# Patient Record
Sex: Female | Born: 1950 | Race: Black or African American | Hispanic: No | Smoking: Never smoker
Health system: Southern US, Community
[De-identification: ages and names within clinical notes are randomized; demographics above are authoritative.]

## PROBLEM LIST (undated history)

## (undated) ENCOUNTER — Emergency Department: Admission: EM | Discharge status: Against Medical Advice | Attending: Emergency Medicine | Admitting: Emergency Medicine

## (undated) DIAGNOSIS — D219 Benign neoplasm of connective and other soft tissue, unspecified: Secondary | ICD-10-CM

## (undated) DIAGNOSIS — M545 Low back pain, unspecified: Secondary | ICD-10-CM

## (undated) DIAGNOSIS — M1990 Unspecified osteoarthritis, unspecified site: Secondary | ICD-10-CM

## (undated) DIAGNOSIS — E785 Hyperlipidemia, unspecified: Secondary | ICD-10-CM

## (undated) DIAGNOSIS — I10 Essential (primary) hypertension: Secondary | ICD-10-CM

## (undated) DIAGNOSIS — R262 Difficulty in walking, not elsewhere classified: Secondary | ICD-10-CM

## (undated) DIAGNOSIS — M2550 Pain in unspecified joint: Secondary | ICD-10-CM

## (undated) DIAGNOSIS — R1012 Left upper quadrant pain: Secondary | ICD-10-CM

## (undated) DIAGNOSIS — E7800 Pure hypercholesterolemia, unspecified: Secondary | ICD-10-CM

## (undated) DIAGNOSIS — K449 Diaphragmatic hernia without obstruction or gangrene: Secondary | ICD-10-CM

## (undated) DIAGNOSIS — L4050 Arthropathic psoriasis, unspecified: Secondary | ICD-10-CM

## (undated) DIAGNOSIS — K5900 Constipation, unspecified: Secondary | ICD-10-CM

## (undated) DIAGNOSIS — H539 Unspecified visual disturbance: Secondary | ICD-10-CM

## (undated) HISTORY — DX: Low back pain, unspecified: M54.50

## (undated) HISTORY — DX: Essential (primary) hypertension: I10

## (undated) HISTORY — DX: Hyperlipidemia, unspecified: E78.5

## (undated) HISTORY — PX: HYSTERECTOMY: SHX81

## (undated) HISTORY — DX: Diaphragmatic hernia without obstruction or gangrene: K44.9

## (undated) HISTORY — DX: Difficulty in walking, not elsewhere classified: R26.2

## (undated) HISTORY — DX: Benign neoplasm of connective and other soft tissue, unspecified: D21.9

## (undated) HISTORY — DX: Constipation, unspecified: K59.00

## (undated) HISTORY — PX: BUNIONECTOMY: SHX129

## (undated) HISTORY — DX: Unspecified osteoarthritis, unspecified site: M19.90

## (undated) HISTORY — DX: Left upper quadrant pain: R10.12

## (undated) HISTORY — DX: Unspecified visual disturbance: H53.9

## (undated) HISTORY — DX: Pure hypercholesterolemia, unspecified: E78.00

## (undated) HISTORY — DX: Pain in unspecified joint: M25.50

## (undated) HISTORY — PX: COLONOSCOPY: SHX174

---

## 2003-07-13 ENCOUNTER — Encounter: Admission: RE | Admit: 2003-07-13 | Discharge: 2003-07-13 | Payer: Self-pay | Admitting: Family Medicine

## 2003-07-13 ENCOUNTER — Encounter: Payer: Self-pay | Admitting: Family Medicine

## 2003-07-31 ENCOUNTER — Other Ambulatory Visit: Admission: RE | Admit: 2003-07-31 | Discharge: 2003-07-31 | Payer: Self-pay | Admitting: Family Medicine

## 2003-09-25 ENCOUNTER — Encounter: Admission: RE | Admit: 2003-09-25 | Discharge: 2003-09-25 | Payer: Self-pay | Admitting: Family Medicine

## 2004-07-28 ENCOUNTER — Other Ambulatory Visit: Admission: RE | Admit: 2004-07-28 | Discharge: 2004-07-28 | Payer: Self-pay | Admitting: Family Medicine

## 2004-09-09 ENCOUNTER — Ambulatory Visit: Payer: Self-pay | Admitting: Family Medicine

## 2004-09-30 ENCOUNTER — Encounter: Admission: RE | Admit: 2004-09-30 | Discharge: 2004-09-30 | Payer: Self-pay | Admitting: Family Medicine

## 2005-08-01 ENCOUNTER — Ambulatory Visit: Payer: Self-pay | Admitting: Family Medicine

## 2005-08-03 ENCOUNTER — Ambulatory Visit: Payer: Self-pay | Admitting: Family Medicine

## 2005-09-04 ENCOUNTER — Encounter: Payer: Self-pay | Admitting: Family Medicine

## 2005-09-04 ENCOUNTER — Other Ambulatory Visit: Admission: RE | Admit: 2005-09-04 | Discharge: 2005-09-04 | Payer: Self-pay | Admitting: Family Medicine

## 2005-09-04 ENCOUNTER — Ambulatory Visit: Payer: Self-pay | Admitting: Family Medicine

## 2005-09-18 ENCOUNTER — Ambulatory Visit: Payer: Self-pay | Admitting: Family Medicine

## 2005-10-30 ENCOUNTER — Encounter: Admission: RE | Admit: 2005-10-30 | Discharge: 2005-10-30 | Payer: Self-pay | Admitting: Family Medicine

## 2006-01-04 ENCOUNTER — Ambulatory Visit: Payer: Self-pay | Admitting: Family Medicine

## 2006-02-07 ENCOUNTER — Ambulatory Visit: Payer: Self-pay | Admitting: Internal Medicine

## 2006-02-16 ENCOUNTER — Ambulatory Visit: Payer: Self-pay | Admitting: Family Medicine

## 2006-03-07 ENCOUNTER — Ambulatory Visit: Payer: Self-pay | Admitting: Internal Medicine

## 2006-04-20 ENCOUNTER — Ambulatory Visit: Payer: Self-pay | Admitting: Family Medicine

## 2006-10-08 ENCOUNTER — Ambulatory Visit: Payer: Self-pay | Admitting: Family Medicine

## 2006-10-08 LAB — CONVERTED CEMR LAB
ALT: 23 units/L (ref 0–40)
AST: 25 units/L (ref 0–37)
Albumin: 3.8 g/dL (ref 3.5–5.2)
Alkaline Phosphatase: 58 units/L (ref 39–117)
BUN: 9 mg/dL (ref 6–23)
CO2: 25 meq/L (ref 19–32)
Calcium: 9.2 mg/dL (ref 8.4–10.5)
Chloride: 103 meq/L (ref 96–112)
Creatinine, Ser: 0.8 mg/dL (ref 0.4–1.2)
GFR calc non Af Amer: 79 mL/min
Glomerular Filtration Rate, Af Am: 96 mL/min/{1.73_m2}
Glucose, Bld: 80 mg/dL (ref 70–99)
Potassium: 3.6 meq/L (ref 3.5–5.1)
Sodium: 137 meq/L (ref 135–145)
Total Bilirubin: 0.8 mg/dL (ref 0.3–1.2)
Total Protein: 7.2 g/dL (ref 6.0–8.3)

## 2006-10-30 ENCOUNTER — Ambulatory Visit: Payer: Self-pay | Admitting: Family Medicine

## 2006-11-16 ENCOUNTER — Ambulatory Visit: Payer: Self-pay | Admitting: Family Medicine

## 2006-11-16 ENCOUNTER — Encounter: Admission: RE | Admit: 2006-11-16 | Discharge: 2006-11-16 | Payer: Self-pay | Admitting: Family Medicine

## 2006-11-30 ENCOUNTER — Encounter: Admission: RE | Admit: 2006-11-30 | Discharge: 2006-11-30 | Payer: Self-pay | Admitting: Family Medicine

## 2006-12-06 HISTORY — PX: COLONOSCOPY: SHX174

## 2007-03-06 ENCOUNTER — Encounter: Payer: Self-pay | Admitting: Family Medicine

## 2007-03-22 ENCOUNTER — Ambulatory Visit: Payer: Self-pay | Admitting: Family Medicine

## 2007-05-22 ENCOUNTER — Telehealth (INDEPENDENT_AMBULATORY_CARE_PROVIDER_SITE_OTHER): Payer: Self-pay | Admitting: *Deleted

## 2007-05-23 ENCOUNTER — Ambulatory Visit: Payer: Self-pay | Admitting: Family Medicine

## 2007-05-24 ENCOUNTER — Encounter: Payer: Self-pay | Admitting: Family Medicine

## 2007-06-14 ENCOUNTER — Telehealth (INDEPENDENT_AMBULATORY_CARE_PROVIDER_SITE_OTHER): Payer: Self-pay | Admitting: *Deleted

## 2007-06-17 ENCOUNTER — Telehealth (INDEPENDENT_AMBULATORY_CARE_PROVIDER_SITE_OTHER): Payer: Self-pay | Admitting: *Deleted

## 2007-07-04 ENCOUNTER — Telehealth (INDEPENDENT_AMBULATORY_CARE_PROVIDER_SITE_OTHER): Payer: Self-pay | Admitting: *Deleted

## 2007-08-13 ENCOUNTER — Ambulatory Visit: Payer: Self-pay | Admitting: Internal Medicine

## 2007-08-13 DIAGNOSIS — J029 Acute pharyngitis, unspecified: Secondary | ICD-10-CM

## 2007-08-13 DIAGNOSIS — K219 Gastro-esophageal reflux disease without esophagitis: Secondary | ICD-10-CM

## 2007-09-12 ENCOUNTER — Ambulatory Visit: Payer: Self-pay | Admitting: Family Medicine

## 2007-09-16 ENCOUNTER — Encounter (INDEPENDENT_AMBULATORY_CARE_PROVIDER_SITE_OTHER): Payer: Self-pay | Admitting: *Deleted

## 2007-09-16 LAB — CONVERTED CEMR LAB
ALT: 21 units/L (ref 0–35)
AST: 21 units/L (ref 0–37)
Albumin: 3.6 g/dL (ref 3.5–5.2)
Alkaline Phosphatase: 50 units/L (ref 39–117)
Bilirubin, Direct: 0.1 mg/dL (ref 0.0–0.3)
Total Bilirubin: 0.7 mg/dL (ref 0.3–1.2)
Total Protein: 7.1 g/dL (ref 6.0–8.3)

## 2007-09-17 ENCOUNTER — Encounter: Payer: Self-pay | Admitting: Family Medicine

## 2007-09-20 ENCOUNTER — Encounter: Payer: Self-pay | Admitting: Family Medicine

## 2007-10-29 ENCOUNTER — Emergency Department (HOSPITAL_COMMUNITY): Admission: EM | Admit: 2007-10-29 | Discharge: 2007-10-29 | Payer: Self-pay | Admitting: Family Medicine

## 2007-10-29 ENCOUNTER — Telehealth (INDEPENDENT_AMBULATORY_CARE_PROVIDER_SITE_OTHER): Payer: Self-pay | Admitting: *Deleted

## 2007-12-12 ENCOUNTER — Encounter: Admission: RE | Admit: 2007-12-12 | Discharge: 2007-12-12 | Payer: Self-pay | Admitting: Family Medicine

## 2007-12-16 ENCOUNTER — Encounter (INDEPENDENT_AMBULATORY_CARE_PROVIDER_SITE_OTHER): Payer: Self-pay | Admitting: *Deleted

## 2007-12-19 ENCOUNTER — Ambulatory Visit: Payer: Self-pay | Admitting: Family Medicine

## 2007-12-19 DIAGNOSIS — F438 Other reactions to severe stress: Secondary | ICD-10-CM

## 2007-12-19 DIAGNOSIS — N393 Stress incontinence (female) (male): Secondary | ICD-10-CM

## 2007-12-19 DIAGNOSIS — I10 Essential (primary) hypertension: Secondary | ICD-10-CM | POA: Insufficient documentation

## 2007-12-19 DIAGNOSIS — E785 Hyperlipidemia, unspecified: Secondary | ICD-10-CM | POA: Insufficient documentation

## 2007-12-24 ENCOUNTER — Encounter: Payer: Self-pay | Admitting: Family Medicine

## 2008-01-10 ENCOUNTER — Telehealth (INDEPENDENT_AMBULATORY_CARE_PROVIDER_SITE_OTHER): Payer: Self-pay | Admitting: *Deleted

## 2008-03-27 ENCOUNTER — Ambulatory Visit: Payer: Self-pay | Admitting: Family Medicine

## 2008-03-30 ENCOUNTER — Encounter (INDEPENDENT_AMBULATORY_CARE_PROVIDER_SITE_OTHER): Payer: Self-pay | Admitting: *Deleted

## 2008-03-30 ENCOUNTER — Encounter: Payer: Self-pay | Admitting: Family Medicine

## 2008-04-09 ENCOUNTER — Ambulatory Visit: Payer: Self-pay | Admitting: Internal Medicine

## 2008-05-18 ENCOUNTER — Telehealth (INDEPENDENT_AMBULATORY_CARE_PROVIDER_SITE_OTHER): Payer: Self-pay | Admitting: *Deleted

## 2008-06-16 ENCOUNTER — Telehealth (INDEPENDENT_AMBULATORY_CARE_PROVIDER_SITE_OTHER): Payer: Self-pay | Admitting: *Deleted

## 2008-06-17 ENCOUNTER — Telehealth (INDEPENDENT_AMBULATORY_CARE_PROVIDER_SITE_OTHER): Payer: Self-pay | Admitting: *Deleted

## 2008-08-23 HISTORY — PX: BUNIONECTOMY: SHX129

## 2008-09-14 ENCOUNTER — Ambulatory Visit: Payer: Self-pay | Admitting: Internal Medicine

## 2008-09-14 DIAGNOSIS — R079 Chest pain, unspecified: Secondary | ICD-10-CM

## 2008-09-14 DIAGNOSIS — R209 Unspecified disturbances of skin sensation: Secondary | ICD-10-CM

## 2008-09-14 DIAGNOSIS — Z860100 Personal history of colon polyps, unspecified: Secondary | ICD-10-CM | POA: Insufficient documentation

## 2008-09-15 ENCOUNTER — Ambulatory Visit: Payer: Self-pay | Admitting: Internal Medicine

## 2008-09-16 ENCOUNTER — Telehealth (INDEPENDENT_AMBULATORY_CARE_PROVIDER_SITE_OTHER): Payer: Self-pay | Admitting: *Deleted

## 2008-11-25 ENCOUNTER — Encounter: Payer: Self-pay | Admitting: Internal Medicine

## 2008-12-28 ENCOUNTER — Encounter: Admission: RE | Admit: 2008-12-28 | Discharge: 2008-12-28 | Payer: Self-pay | Admitting: Family Medicine

## 2008-12-31 ENCOUNTER — Encounter: Admission: RE | Admit: 2008-12-31 | Discharge: 2008-12-31 | Payer: Self-pay | Admitting: Family Medicine

## 2009-01-01 ENCOUNTER — Encounter (INDEPENDENT_AMBULATORY_CARE_PROVIDER_SITE_OTHER): Payer: Self-pay | Admitting: *Deleted

## 2009-01-01 ENCOUNTER — Encounter: Payer: Self-pay | Admitting: Family Medicine

## 2009-01-15 ENCOUNTER — Telehealth (INDEPENDENT_AMBULATORY_CARE_PROVIDER_SITE_OTHER): Payer: Self-pay | Admitting: *Deleted

## 2009-01-25 ENCOUNTER — Encounter: Payer: Self-pay | Admitting: Family Medicine

## 2009-01-25 ENCOUNTER — Ambulatory Visit: Payer: Self-pay | Admitting: Family Medicine

## 2009-01-25 ENCOUNTER — Other Ambulatory Visit: Admission: RE | Admit: 2009-01-25 | Discharge: 2009-01-25 | Payer: Self-pay | Admitting: Family Medicine

## 2009-01-26 ENCOUNTER — Ambulatory Visit: Payer: Self-pay | Admitting: Family Medicine

## 2009-01-26 LAB — CONVERTED CEMR LAB
ALT: 18 units/L (ref 0–35)
AST: 21 units/L (ref 0–37)
Albumin: 3.8 g/dL (ref 3.5–5.2)
Alkaline Phosphatase: 46 units/L (ref 39–117)
BUN: 14 mg/dL (ref 6–23)
Basophils Absolute: 0 10*3/uL (ref 0.0–0.1)
Basophils Relative: 0 % (ref 0.0–3.0)
Bilirubin, Direct: 0 mg/dL (ref 0.0–0.3)
CO2: 30 meq/L (ref 19–32)
Calcium: 9.6 mg/dL (ref 8.4–10.5)
Chloride: 103 meq/L (ref 96–112)
Cholesterol: 192 mg/dL (ref 0–200)
Creatinine, Ser: 1 mg/dL (ref 0.4–1.2)
Eosinophils Absolute: 0.1 10*3/uL (ref 0.0–0.7)
Eosinophils Relative: 1.1 % (ref 0.0–5.0)
GFR calc non Af Amer: 73.65 mL/min (ref >60)
Glucose, Bld: 99 mg/dL (ref 70–99)
HCT: 38.2 % (ref 36.0–46.0)
HDL: 63.4 mg/dL (ref >39.00)
Hemoglobin: 12.6 g/dL (ref 12.0–15.0)
LDL Cholesterol: 108 mg/dL — ABNORMAL HIGH (ref 0–99)
Lymphocytes Relative: 44.2 % (ref 12.0–46.0)
Lymphs Abs: 2.5 10*3/uL (ref 0.7–4.0)
MCHC: 32.9 g/dL (ref 30.0–36.0)
MCV: 85 fL (ref 78.0–100.0)
Monocytes Absolute: 0.5 10*3/uL (ref 0.1–1.0)
Monocytes Relative: 8.4 % (ref 3.0–12.0)
Neutro Abs: 2.6 10*3/uL (ref 1.4–7.7)
Neutrophils Relative %: 46.3 % (ref 43.0–77.0)
Platelets: 167 10*3/uL (ref 150.0–400.0)
Potassium: 3.9 meq/L (ref 3.5–5.1)
RBC: 4.5 M/uL (ref 3.87–5.11)
RDW: 12.8 % (ref 11.5–14.6)
Sodium: 139 meq/L (ref 135–145)
TSH: 3.17 microintl units/mL (ref 0.35–5.50)
Total Bilirubin: 0.8 mg/dL (ref 0.3–1.2)
Total CHOL/HDL Ratio: 3
Total Protein: 7.5 g/dL (ref 6.0–8.3)
Triglycerides: 101 mg/dL (ref 0.0–149.0)
VLDL: 20.2 mg/dL (ref 0.0–40.0)
WBC: 5.7 10*3/uL (ref 4.5–10.5)

## 2009-01-27 ENCOUNTER — Encounter (INDEPENDENT_AMBULATORY_CARE_PROVIDER_SITE_OTHER): Payer: Self-pay | Admitting: *Deleted

## 2009-01-28 ENCOUNTER — Encounter (INDEPENDENT_AMBULATORY_CARE_PROVIDER_SITE_OTHER): Payer: Self-pay | Admitting: *Deleted

## 2009-02-01 LAB — CONVERTED CEMR LAB

## 2009-02-02 ENCOUNTER — Encounter (INDEPENDENT_AMBULATORY_CARE_PROVIDER_SITE_OTHER): Payer: Self-pay | Admitting: *Deleted

## 2009-04-16 ENCOUNTER — Ambulatory Visit: Payer: Self-pay | Admitting: Internal Medicine

## 2009-04-16 DIAGNOSIS — M7710 Lateral epicondylitis, unspecified elbow: Secondary | ICD-10-CM | POA: Insufficient documentation

## 2009-04-23 ENCOUNTER — Telehealth (INDEPENDENT_AMBULATORY_CARE_PROVIDER_SITE_OTHER): Payer: Self-pay | Admitting: *Deleted

## 2009-08-11 ENCOUNTER — Encounter (INDEPENDENT_AMBULATORY_CARE_PROVIDER_SITE_OTHER): Payer: Self-pay | Admitting: *Deleted

## 2009-08-11 ENCOUNTER — Ambulatory Visit: Payer: Self-pay | Admitting: Family Medicine

## 2009-08-11 DIAGNOSIS — N6019 Diffuse cystic mastopathy of unspecified breast: Secondary | ICD-10-CM

## 2009-08-12 ENCOUNTER — Telehealth (INDEPENDENT_AMBULATORY_CARE_PROVIDER_SITE_OTHER): Payer: Self-pay | Admitting: *Deleted

## 2009-08-23 ENCOUNTER — Telehealth (INDEPENDENT_AMBULATORY_CARE_PROVIDER_SITE_OTHER): Payer: Self-pay | Admitting: *Deleted

## 2009-08-23 ENCOUNTER — Encounter (INDEPENDENT_AMBULATORY_CARE_PROVIDER_SITE_OTHER): Payer: Self-pay | Admitting: *Deleted

## 2009-08-23 LAB — CONVERTED CEMR LAB
ALT: 37 units/L — ABNORMAL HIGH (ref 0–35)
AST: 32 units/L (ref 0–37)
Albumin: 4.2 g/dL (ref 3.5–5.2)
Alkaline Phosphatase: 46 units/L (ref 39–117)
BUN: 12 mg/dL (ref 6–23)
Bilirubin, Direct: 0.1 mg/dL (ref 0.0–0.3)
CO2: 29 meq/L (ref 19–32)
Calcium: 9.8 mg/dL (ref 8.4–10.5)
Chloride: 107 meq/L (ref 96–112)
Cholesterol: 215 mg/dL — ABNORMAL HIGH (ref 0–200)
Creatinine, Ser: 1.2 mg/dL (ref 0.4–1.2)
Direct LDL: 161.8 mg/dL
GFR calc non Af Amer: 59.56 mL/min (ref >60)
Glucose, Bld: 98 mg/dL (ref 70–99)
HDL: 45.1 mg/dL (ref >39.00)
Potassium: 4.5 meq/L (ref 3.5–5.1)
Sodium: 145 meq/L (ref 135–145)
Total Bilirubin: 0.7 mg/dL (ref 0.3–1.2)
Total CHOL/HDL Ratio: 5
Total Protein: 7.3 g/dL (ref 6.0–8.3)
Triglycerides: 90 mg/dL (ref 0.0–149.0)
VLDL: 18 mg/dL (ref 0.0–40.0)

## 2009-08-27 ENCOUNTER — Encounter: Payer: Self-pay | Admitting: Family Medicine

## 2009-12-30 ENCOUNTER — Encounter: Admission: RE | Admit: 2009-12-30 | Discharge: 2009-12-30 | Payer: Self-pay | Admitting: Family Medicine

## 2010-01-15 ENCOUNTER — Emergency Department (HOSPITAL_COMMUNITY): Admission: EM | Admit: 2010-01-15 | Discharge: 2010-01-15 | Payer: Self-pay | Admitting: Family Medicine

## 2010-01-17 ENCOUNTER — Ambulatory Visit: Payer: Self-pay | Admitting: Family Medicine

## 2010-01-17 ENCOUNTER — Telehealth: Payer: Self-pay | Admitting: Family Medicine

## 2010-01-17 DIAGNOSIS — B029 Zoster without complications: Secondary | ICD-10-CM | POA: Insufficient documentation

## 2010-02-25 ENCOUNTER — Encounter: Payer: Self-pay | Admitting: Family Medicine

## 2010-03-31 ENCOUNTER — Telehealth (INDEPENDENT_AMBULATORY_CARE_PROVIDER_SITE_OTHER): Payer: Self-pay | Admitting: *Deleted

## 2010-04-05 ENCOUNTER — Ambulatory Visit: Payer: Self-pay | Admitting: Family Medicine

## 2010-06-07 ENCOUNTER — Telehealth (INDEPENDENT_AMBULATORY_CARE_PROVIDER_SITE_OTHER): Payer: Self-pay | Admitting: *Deleted

## 2010-08-04 ENCOUNTER — Emergency Department (HOSPITAL_COMMUNITY): Admission: EM | Admit: 2010-08-04 | Discharge: 2010-08-04 | Payer: Self-pay | Admitting: Family Medicine

## 2010-11-17 ENCOUNTER — Telehealth (INDEPENDENT_AMBULATORY_CARE_PROVIDER_SITE_OTHER): Payer: Self-pay | Admitting: *Deleted

## 2010-11-20 LAB — CONVERTED CEMR LAB
ALT: 13 units/L (ref 0–35)
ALT: 17 units/L (ref 0–35)
ALT: 21 units/L (ref 0–35)
ALT: 27 units/L (ref 0–35)
AST: 18 units/L (ref 0–37)
AST: 20 units/L (ref 0–37)
AST: 22 units/L (ref 0–37)
AST: 27 units/L (ref 0–37)
Albumin: 3.6 g/dL (ref 3.5–5.2)
Albumin: 3.9 g/dL (ref 3.5–5.2)
Albumin: 4 g/dL (ref 3.5–5.2)
Albumin: 4 g/dL (ref 3.5–5.2)
Alkaline Phosphatase: 47 units/L (ref 39–117)
Alkaline Phosphatase: 48 units/L (ref 39–117)
Alkaline Phosphatase: 54 units/L (ref 39–117)
Alkaline Phosphatase: 59 units/L (ref 39–117)
BUN: 12 mg/dL (ref 6–23)
BUN: 14 mg/dL (ref 6–23)
BUN: 14 mg/dL (ref 6–23)
Bilirubin, Direct: 0.1 mg/dL (ref 0.0–0.3)
Bilirubin, Direct: 0.1 mg/dL (ref 0.0–0.3)
Bilirubin, Direct: 0.1 mg/dL (ref 0.0–0.3)
Bilirubin, Direct: 0.1 mg/dL (ref 0.0–0.3)
CO2: 29 meq/L (ref 19–32)
CO2: 30 meq/L (ref 19–32)
CO2: 31 meq/L (ref 19–32)
CRP, High Sensitivity: 3 (ref 0.00–5.00)
Calcium: 9.1 mg/dL (ref 8.4–10.5)
Calcium: 9.5 mg/dL (ref 8.4–10.5)
Calcium: 9.6 mg/dL (ref 8.4–10.5)
Chloride: 100 meq/L (ref 96–112)
Chloride: 105 meq/L (ref 96–112)
Chloride: 107 meq/L (ref 96–112)
Creatinine, Ser: 1 mg/dL (ref 0.4–1.2)
Creatinine, Ser: 1 mg/dL (ref 0.4–1.2)
Creatinine, Ser: 1 mg/dL (ref 0.4–1.2)
GFR calc Af Amer: 74 mL/min
GFR calc Af Amer: 74 mL/min
GFR calc Af Amer: 74 mL/min
GFR calc non Af Amer: 61 mL/min
GFR calc non Af Amer: 61 mL/min
GFR calc non Af Amer: 61 mL/min
Glucose, Bld: 101 mg/dL — ABNORMAL HIGH (ref 70–99)
Glucose, Bld: 101 mg/dL — ABNORMAL HIGH (ref 70–99)
Glucose, Bld: 99 mg/dL (ref 70–99)
Hgb A1c MFr Bld: 6 % (ref 4.6–6.0)
Potassium: 3.6 meq/L (ref 3.5–5.1)
Potassium: 3.7 meq/L (ref 3.5–5.1)
Potassium: 3.7 meq/L (ref 3.5–5.1)
Sodium: 139 meq/L (ref 135–145)
Sodium: 140 meq/L (ref 135–145)
Sodium: 142 meq/L (ref 135–145)
TSH: 2.09 microintl units/mL (ref 0.35–5.50)
Total Bilirubin: 0.4 mg/dL (ref 0.3–1.2)
Total Bilirubin: 0.7 mg/dL (ref 0.3–1.2)
Total Bilirubin: 0.7 mg/dL (ref 0.3–1.2)
Total Bilirubin: 0.8 mg/dL (ref 0.3–1.2)
Total Protein: 6.6 g/dL (ref 6.0–8.3)
Total Protein: 7 g/dL (ref 6.0–8.3)
Total Protein: 7.3 g/dL (ref 6.0–8.3)
Total Protein: 8 g/dL (ref 6.0–8.3)
Troponin I: 0.03 ng/mL (ref <0.06)
Vit D, 1,25-Dihydroxy: 8 — ABNORMAL LOW (ref 30–89)

## 2010-11-22 NOTE — Progress Notes (Signed)
Summary: refill  Phone Note Refill Request   Refills Requested: Medication #1:  CRESTOR 5 MG TABS take 1 tab every other day patient wants 90 day supply [facility] - .   note from labs oct 2010  We will recheck labs in ___3 months.   NMR , hep  272.4  Signed by Loreen Freud DO on 08/22/2009 at 10:25 PM  --- lab scheduled [phone]    Signed by Loreen Freud DO on 08/22/2009 at 10:25 PM   Method Requested: Fax to Mail Away Pharmacy Initial call taken by: Okey Regal [place],  March 31, 2010 2:16 PM    Prescriptions: CRESTOR 5 MG TABS (ROSUVASTATIN CALCIUM) take 1 tab every other day  #45 x 0   Entered by:   Army Fossa CMA   Authorized by:   Loreen Freud DO   Signed by:   Army Fossa CMA on 03/31/2010   Method used:   Electronically to        [facility] (mail-order)             ,          Ph: [phone]       Fax: [phone]   RxID:   [id number]

## 2010-11-22 NOTE — Progress Notes (Signed)
Summary: FYI  Phone Note Call from Patient   Summary of Call: Pt called to see if she was supposed to continue taking the Valacylovir and the Vicodin while on the Gabapentin. I told her per Dr.Lowne yes to continue with the Gabapentin. She stated she was going to take everything the way Dr.Lowne told her. She states the pharmacist told her to take everything the same except to stop the Tramadol. Army Fossa CMA  January 17, 2010 4:00 PM   Follow-up for Phone Call        she did not think the tramadol was helping-- and making her drowsey---so I gave her the vicodin-----she should take one or the other for pain---not both at the same time Follow-up by: Loreen Freud DO,  January 17, 2010 4:12 PM  Additional Follow-up for Phone Call Additional follow up Details #1::        Pt is aware. Army Fossa CMA  January 17, 2010 4:14 PM

## 2010-11-22 NOTE — Assessment & Plan Note (Signed)
Summary: DISCUSS SHINGLES/PT STATES SHE WENT TO URGENT CARE AND WAS DI...   Vital Signs:  Patient profile:   60 year old female Weight:      237 pounds Temp:     98.6 degrees F oral Pulse rate:   78 / minute Pulse rhythm:   regular BP sitting:   124 / 80  (left arm) Cuff size:   large  Vitals Entered By: Army Fossa CMA (January 17, 2010 10:22 AM) CC: Pt here to discuss Shingles, was diagnosed at [facility] Saturday.   History of Present Illness: Pt here f/u Shingles.  Pt taking valtrex--- see UC note.  Pt is still in a lot of pain.  No new complaints.   Current Medications (verified): 1)  Lisinopril-Hydrochlorothiazide 20-25 Mg  Tabs (Lisinopril-Hydrochlorothiazide) .Marland Kitchen.. 1 By Mouth Once Daily 2)  Crestor 5 Mg Tabs (Rosuvastatin Calcium) .... Take 1 Tab Every Other Day 3)  Valacyclovir Hcl 1 Gm Tabs (Valacyclovir Hcl) .... Take 1 By Mouth Three Times A Day For 7 Days. 4)  Tramadol Hcl 50 Mg Tabs (Tramadol Hcl) .... Take 1-2 By Mouth Every 6 Hrs As Needed For Pain. 5)  Neurontin 100 Mg Caps (Gabapentin) .Marland Kitchen.. 1 By Mouth Three Times A Day 6)  Vicodin Es 7.5-750 Mg Tabs (Hydrocodone-Acetaminophen) .Marland Kitchen.. 1 By Mouth Every 6 Hours As Needed  Allergies (verified): No Known Drug Allergies  Past History:  Past medical, surgical, family and social histories (including risk factors) reviewed for relevance to current acute and chronic problems.  Past Medical History: Reviewed history from 09/14/2008 and no changes required. GERD Hyperlipidemia Hypertension Colonic polyps, hx of 2005  Past Surgical History: Reviewed history from 09/14/2008 and no changes required. Hysterectomy (1999)--partial G 1 P 1 Colon polypectomy 2005; 2007 negative  Family History: Reviewed history from 01/25/2009 and no changes required. Father: HTN, CHF Mother: HTN, pancreatic &  lung CA Siblings: bro & sister  HTN ; no FH CAD,CVA cousin-- MI  Family History Diabetes 1st degree relative Family  History Hypertension  Social History: Reviewed history from 12/19/2007 and no changes required. Single Never Smoked Drug use-no Regular exercise-no  Review of Systems      See HPI  Physical Exam  General:  Well-developed,well-nourished,in no acute distress; alert,appropriate and cooperative throughout examination Skin:  + vesicles R side face Psych:  Oriented X3 and normally interactive.     Impression & Recommendations:  Problem # 1:  SHINGLES (ICD-053.9) cont' valtrex for total 10 days vicodin and neurontin for pain call or rto as needed   Complete Medication List: 1)  Lisinopril-hydrochlorothiazide 20-25 Mg Tabs (Lisinopril-hydrochlorothiazide) .Marland Kitchen.. 1 by mouth once daily 2)  Crestor 5 Mg Tabs (Rosuvastatin calcium) .... Take 1 tab every other day 3)  Valacyclovir Hcl 1 Gm Tabs (Valacyclovir hcl) .... Take 1 by mouth three times a day for 7 days. 4)  Tramadol Hcl 50 Mg Tabs (Tramadol hcl) .... Take 1-2 by mouth every 6 hrs as needed for pain. 5)  Neurontin 100 Mg Caps (Gabapentin) .Marland Kitchen.. 1 by mouth three times a day 6)  Vicodin Es 7.5-750 Mg Tabs (Hydrocodone-acetaminophen) .Marland Kitchen.. 1 by mouth every 6 hours as needed Prescriptions: VALACYCLOVIR HCL 1 GM TABS (VALACYCLOVIR HCL) Take 1 by mouth three times a day for 7 days.  #12 x 0   Entered and Authorized by:   Loreen Freud DO   Signed by:   Loreen Freud DO on 01/17/2010   Method used:   Electronically to  [facility] [address] [id number]* (retail)       [address]       [place], [place]  [address]       Ph: [phone]       Fax: [phone]   RxID:   [id number] VICODIN ES 7.5-750 MG TABS (HYDROCODONE-ACETAMINOPHEN) 1 by mouth every 6 hours as needed  #60 x 0   Entered and Authorized by:   Loreen Freud DO   Signed by:   Loreen Freud DO on 01/17/2010   Method used:   Print then Give to Patient   RxID:   [id number] NEURONTIN 100 MG CAPS (GABAPENTIN) 1 by mouth three times  a day  #90 x 0   Entered and Authorized by:   Loreen Freud DO   Signed by:   Loreen Freud DO on 01/17/2010   Method used:   Electronically to        [id number]* (retail)       [address]       [place], [place]  [address]       Ph: [phone]       Fax: [phone]   RxID:   [id number]

## 2010-11-22 NOTE — Progress Notes (Signed)
Summary: ASAP--NEEDS CRESTOR CALLED [facility]; NEEDS LISINOPRIL [facility]  Phone Note Call from Patient Call back at Work Phone [phone]   Caller: Patient Summary of Call: PATIENT NEEDS CRESTOR CALLED IN TO [facility]--ONLY HAS 8 PILLS LEFT  PATIENT NEEDS LISINOPRIL-HYDRO 20-25 CALLED IN TO LOCAL [facility] [address]---ONLY HAS 2 PILLS LEFT Initial call taken by: Jerolyn Shin,  June 07, 2010 10:59 AM  Follow-up for Phone Call        left pt detail message rx faxed to pharmacy.................Marland KitchenFelecia Deloach CMA  June 07, 2010 2:04 PM     Prescriptions: LISINOPRIL-HYDROCHLOROTHIAZIDE 20-25 MG  TABS (LISINOPRIL-HYDROCHLOROTHIAZIDE) 1 by mouth once daily  #90.0 Each x 0   Entered by:   Jeremy Johann CMA   Authorized by:   Loreen Freud DO   Signed by:   Jeremy Johann CMA on 06/07/2010   Method used:   Faxed to ...       [facility] [address] [id number]* (retail)       [address]       [place], [place]  [address]       Ph: [phone]       Fax: [phone]   RxID:   [id number] CRESTOR 5 MG TABS (ROSUVASTATIN CALCIUM) take 1 tab by mouth daily.  #90 x 0   Entered by:   Jeremy Johann CMA   Authorized by:   Loreen Freud DO   Signed by:   Jeremy Johann CMA on 06/07/2010   Method used:   Faxed to ...       [facility]* (retail)             ,          Ph: [phone]       Fax: [phone]   RxID:   [id number]

## 2010-11-24 NOTE — Progress Notes (Signed)
Summary: rx  Phone Note Call from Patient Call back at Work Phone [phone]   Caller: Patient Call For: Loreen Freud DO Summary of Call: Pt is calling about her medication that she left up at her daughter house and she haven't taken it for 3 days and like to know how long will it be before you call it in to [facility] [address]--[phone].  Initial call taken by: Freddy Jaksch,  November 17, 2010 2:40 PM  Follow-up for Phone Call        spoke with patient and she wanted her BP meds Lisinopril.Marland KitchenMarland KitchenMarland KitchenI advised it was faxed already.Marland KitchenMarland KitchenShe voiced understanding.... Almeta Monas CMA Duncan Dull)  November 17, 2010 2:43 PM  Follow-up by: Almeta Monas CMA Duncan Dull),  November 17, 2010 2:43 PM

## 2011-01-03 ENCOUNTER — Encounter (INDEPENDENT_AMBULATORY_CARE_PROVIDER_SITE_OTHER): Payer: BC Managed Care – PPO | Admitting: Family Medicine

## 2011-01-03 ENCOUNTER — Other Ambulatory Visit: Payer: Self-pay | Admitting: Family Medicine

## 2011-01-03 ENCOUNTER — Encounter: Payer: Self-pay | Admitting: Family Medicine

## 2011-01-03 DIAGNOSIS — Z0000 Encounter for general adult medical examination without abnormal findings: Secondary | ICD-10-CM

## 2011-01-03 DIAGNOSIS — E785 Hyperlipidemia, unspecified: Secondary | ICD-10-CM

## 2011-01-03 DIAGNOSIS — K219 Gastro-esophageal reflux disease without esophagitis: Secondary | ICD-10-CM

## 2011-01-03 DIAGNOSIS — N393 Stress incontinence (female) (male): Secondary | ICD-10-CM

## 2011-01-03 DIAGNOSIS — Z23 Encounter for immunization: Secondary | ICD-10-CM

## 2011-01-03 DIAGNOSIS — I10 Essential (primary) hypertension: Secondary | ICD-10-CM

## 2011-01-03 LAB — CONVERTED CEMR LAB
Blood in Urine, dipstick: NEGATIVE
Glucose, Urine, Semiquant: NEGATIVE
Nitrite: NEGATIVE
Specific Gravity, Urine: 1.02
WBC Urine, dipstick: NEGATIVE
pH: 5

## 2011-01-03 LAB — CBC WITH DIFFERENTIAL/PLATELET
Basophils Relative: 0.6 % (ref 0.0–3.0)
Eosinophils Absolute: 0.1 10*3/uL (ref 0.0–0.7)
Eosinophils Relative: 1.1 % (ref 0.0–5.0)
HCT: 38.8 % (ref 36.0–46.0)
Hemoglobin: 12.9 g/dL (ref 12.0–15.0)
MCHC: 33.2 g/dL (ref 30.0–36.0)
MCV: 85.4 fl (ref 78.0–100.0)
Monocytes Absolute: 0.4 10*3/uL (ref 0.1–1.0)
Neutro Abs: 3.1 10*3/uL (ref 1.4–7.7)
RBC: 4.55 Mil/uL (ref 3.87–5.11)
WBC: 5.8 10*3/uL (ref 4.5–10.5)

## 2011-01-03 LAB — BASIC METABOLIC PANEL
BUN: 15 mg/dL (ref 6–23)
Creatinine, Ser: 1 mg/dL (ref 0.4–1.2)
GFR: 72.31 mL/min (ref >60.00)
Glucose, Bld: 98 mg/dL (ref 70–99)

## 2011-01-03 LAB — HEPATIC FUNCTION PANEL
ALT: 18 U/L (ref 0–35)
AST: 21 U/L (ref 0–37)
Albumin: 4.2 g/dL (ref 3.5–5.2)
Total Bilirubin: 0.5 mg/dL (ref 0.3–1.2)

## 2011-01-03 LAB — TSH: TSH: 3.16 u[IU]/mL (ref 0.35–5.50)

## 2011-01-03 LAB — LDL CHOLESTEROL, DIRECT: Direct LDL: 169.8 mg/dL

## 2011-01-05 ENCOUNTER — Other Ambulatory Visit: Payer: Self-pay | Admitting: Family Medicine

## 2011-01-05 DIAGNOSIS — Z1231 Encounter for screening mammogram for malignant neoplasm of breast: Secondary | ICD-10-CM

## 2011-01-10 ENCOUNTER — Telehealth (INDEPENDENT_AMBULATORY_CARE_PROVIDER_SITE_OTHER): Payer: Self-pay | Admitting: *Deleted

## 2011-01-10 ENCOUNTER — Encounter: Payer: Self-pay | Admitting: Family Medicine

## 2011-01-10 ENCOUNTER — Ambulatory Visit
Admission: RE | Admit: 2011-01-10 | Discharge: 2011-01-10 | Disposition: A | Discharge status: Home / Self Care | Payer: BC Managed Care – PPO | Source: Ambulatory Visit | Attending: Family Medicine | Admitting: Family Medicine

## 2011-01-10 DIAGNOSIS — Z1231 Encounter for screening mammogram for malignant neoplasm of breast: Secondary | ICD-10-CM

## 2011-01-10 NOTE — Assessment & Plan Note (Signed)
Summary: cpx/cbs   Vital Signs:  Patient profile:   60 year old female Menstrual status:  hysterectomy Height:      68.25 inches Weight:      238.0 pounds BMI:     36.05 Pulse rate:   64 / minute Pulse rhythm:   regular BP sitting:   116 / 70  (left arm) Cuff size:   large  Vitals Entered By: Almeta Monas CMA Duncan Dull) (January 03, 2011 9:36 AM) CC: CPX/Fasting--No Pap     Menstrual Status hysterectomy Last PAP Result NEGATIVE FOR INTRAEPITHELIAL LESIONS OR MALIGNANCY.   History of Present Illness: Pt here for cpe and labs.  No pap.  Pt is not currently working.  She is looking to relocate back home to [place] or [place].    No complaints.    Preventive Screening-Counseling & Management  Alcohol-Tobacco     Alcohol drinks/day: <1     Alcohol type: wine     >5/day in last 3 mos: no     Smoking Status: never  Caffeine-Diet-Exercise     Caffeine use/day: green tea only     Does Patient Exercise: yes     Type of exercise: walking     Exercise (avg: min/session): >60     Times/week: 4  Hep-HIV-STD-Contraception     Dental Visit-last 6 months no     Dental Care Counseling: to seek dental care; no dental care within six months     SBE monthly: yes     SBE Education/Counseling: not indicated; SBE done regularly  Safety-Violence-Falls     Seat Belt Use: yes     Smoke Detectors: yes      Sexual History:  single and not sexually active--broke off engagement.        Drug Use:  never.        Blood Transfusions:  no.        Travel History:  [place].    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 1 By Mouth Once Daily** Office Visit Due Now** 2)  Crestor 5 Mg Tabs (Rosuvastatin Calcium) .... Take 1 Tab By Mouth Daily. 3)  Valacyclovir Hcl 1 Gm Tabs (Valacyclovir Hcl) .... Take 1 By Mouth Three Times A Day For 7 Days. 4)  Tramadol Hcl 50 Mg Tabs (Tramadol Hcl) .... Take 1-2 By Mouth Every 6 Hrs As Needed For Pain. 5)  Neurontin 100 Mg Caps (Gabapentin) .Marland Kitchen.. 1 By Mouth Three Times A Day 6)  Vicodin Es 7.5-750 Mg Tabs (Hydrocodone-Acetaminophen) .Marland Kitchen.. 1 By Mouth Every 6 Hours As Needed  Current Medications (verified): 1)  Lisinopril-Hydrochlorothiazide 20-25 Mg  Tabs (Lisinopril-Hydrochlorothiazide) .Marland Kitchen.. 1 By Mouth Once Day 2)  Crestor 5 Mg Tabs (Rosuvastatin Calcium) .... Take 1 Tab By Mouth Daily.  Allergies (verified): No Known Drug Allergies  Past History:  Family History: Last updated: 01/25/2009 Father: HTN, CHF Mother: HTN, pancreatic &  lung  CA Siblings: bro & sister  HTN ; no FH CAD,CVA cousin-- MI  Family History Diabetes 1st degree relative Family History Hypertension  Social History: Last updated: 12/19/2007 Single Never Smoked Drug use-no Regular exercise-no  Risk Factors: Alcohol Use: <1 (01/03/2011) >5 drinks/d w/in last 3 months: no (01/03/2011) Caffeine Use: green tea only (01/03/2011) Exercise: yes (01/03/2011)  Risk Factors: Smoking Status: never (01/03/2011)  Past medical, surgical, family and social histories (including risk factors) reviewed for relevance to current acute and chronic problems.  Past Medical History: Reviewed history from 09/14/2008 and no changes required. GERD Hyperlipidemia Hypertension Colonic polyps, hx of 2005  Past Surgical History: Reviewed history from 09/14/2008 and  no changes required. Hysterectomy (1999)--partial G 1 P 1 Colon polypectomy 2005; 2007 negative  Family History: Reviewed history from 01/25/2009 and no changes required. Father: HTN, CHF Mother: HTN, pancreatic &  lung CA Siblings: bro & sister  HTN ; no FH CAD,CVA cousin-- MI  Family History Diabetes 1st degree relative Family History Hypertension  Social History: Reviewed history from 12/19/2007 and no changes required. Single Never Smoked Drug use-no Regular exercise-no  Review of Systems      See HPI General:  Denies chills, fatigue, fever, loss of appetite, malaise, sleep disorder, sweats, weakness, and weight loss. Eyes:  Denies blurring, discharge, double vision, eye irritation, eye pain, halos, itching, light sensitivity, red eye, vision loss-1 eye, and vision loss-both eyes; optho-- q2y. CV:  Denies bluish discoloration of lips or nails, chest pain or discomfort, difficulty breathing at night, difficulty breathing while lying down, fainting, fatigue, leg cramps with exertion, lightheadness, near fainting, palpitations, shortness of breath with exertion, swelling of feet, swelling of hands, and weight gain. Resp:  Denies chest discomfort, chest pain with inspiration, cough, coughing up blood, excessive snoring, hypersomnolence, morning headaches, pleuritic, shortness of breath, sputum productive, and wheezing. GI:  Denies abdominal pain, bloody stools, change in bowel habits, constipation, dark tarry stools, diarrhea, excessive appetite, gas, hemorrhoids, indigestion, loss of appetite, nausea, vomiting, vomiting blood, and yellowish skin color. GU:  Denies abnormal vaginal bleeding, decreased libido, discharge, dysuria, genital sores, hematuria, incontinence, nocturia, urinary frequency, and urinary hesitancy. MS:  Denies joint pain, joint redness, joint swelling, loss of strength, low back pain, mid back pain, muscle aches, muscle , cramps, muscle weakness, stiffness, and  thoracic pain. Derm:  Denies changes in color of skin, changes in nail beds, dryness, excessive perspiration, flushing, hair loss, insect bite(s), itching, lesion(s), poor wound healing, and rash. Neuro:  Denies brief paralysis, difficulty with concentration, disturbances in coordination, falling down, headaches, inability to speak, memory loss, numbness, poor balance, seizures, sensation of room spinning, tingling, tremors, visual disturbances, and weakness. Psych:  Denies alternate hallucination ( auditory/visual), anxiety, depression, easily angered, easily tearful, irritability, mental problems, panic attacks, sense of great danger, suicidal thoughts/plans, thoughts of violence, unusual visions or sounds, and thoughts /plans of harming others. Endo:  Denies cold intolerance, excessive hunger, excessive thirst, excessive urination, heat intolerance, polyuria, and weight change. Heme:  Denies abnormal bruising, bleeding, enlarge lymph nodes, fevers, pallor, and skin discoloration. Allergy:  Denies hives or rash, itching eyes, persistent infections, seasonal allergies, and sneezing.  Physical Exam  General:  Well-developed,well-nourished,in no acute distress; alert,appropriate and cooperative throughout examination Head:  Normocephalic and atraumatic without obvious abnormalities. No apparent alopecia or balding. Eyes:  pupils equal, pupils round, pupils reactive to light, and no injection.   Ears:  External ear exam shows no significant lesions or deformities.  Otoscopic examination reveals  clear canals, tympanic membranes are intact bilaterally without bulging, retraction, inflammation or discharge. Hearing is grossly normal bilaterally. Nose:  External nasal examination shows no deformity or inflammation. Nasal mucosa are pink and moist without lesions or exudates. Mouth:  Oral mucosa and oropharynx without lesions or exudates.  Teeth in good repair. Neck:  No deformities, masses, or tenderness  noted. Chest Wall:  No deformities, masses, or tenderness noted. Lungs:  Normal respiratory effort, chest expands symmetrically. Lungs are clear to auscultation, no crackles or wheezes. Heart:  normal rate and no murmur.   Abdomen:  Bowel sounds positive,abdomen soft and non-tender without masses, organomegaly or hernias noted. Rectal:  gyn Genitalia:  gyn Msk:  normal ROM and no joint tenderness.   Pulses:  R and L carotid,radial,femoral,dorsalis pedis and posterior tibial pulses are full and equal bilaterally Extremities:  No clubbing, cyanosis, edema, or deformity noted with normal full range of motion of all joints.   Neurologic:  No cranial nerve deficits noted. Station and gait are normal. Plantar reflexes are down-going bilaterally. DTRs are symmetrical throughout. Sensory, motor and coordinative functions appear intact. Skin:  Intact without suspicious lesions or rashes Cervical Nodes:  No lymphadenopathy noted Axillary Nodes:  No palpable lymphadenopathy Psych:  Oriented X3, normally interactive, good eye contact, not anxious appearing, not depressed appearing, not agitated, and not suicidal.     Impression & Recommendations:  Problem # 1:  PREVENTIVE HEALTH CARE (ICD-V70.0)  Orders: Venipuncture (91478) TLB-Lipid Panel (80061-LIPID) TLB-CBC Platelet - w/Differential (85025-CBCD) TLB-BMP (Basic Metabolic Panel-BMET) (80048-METABOL) TLB-Hepatic/Liver Function Pnl (80076-HEPATIC) TLB-TSH (Thyroid Stimulating Hormone) (84443-TSH) Specimen Handling (29562) UA Dipstick W/ Micro (manual) (81000) EKG w/ Interpretation (93000)  Problem # 2:  HYPERTENSION (ICD-401.9)  Her updated medication list for this problem includes:    Lisinopril-hydrochlorothiazide 20-25 Mg Tabs (Lisinopril-hydrochlorothiazide) .Marland Kitchen... 1 by mouth once day  Orders: Venipuncture (13086) TLB-Lipid Panel (80061-LIPID) TLB-CBC Platelet - w/Differential (85025-CBCD) TLB-BMP (Basic Metabolic Panel-BMET)  (80048-METABOL) TLB-Hepatic/Liver Function Pnl (80076-HEPATIC) TLB-TSH (Thyroid Stimulating Hormone) (84443-TSH) Specimen Handling (57846) EKG w/ Interpretation (93000)  Problem # 3:  HYPERLIPIDEMIA (ICD-272.4)  Her updated medication list for this problem includes:    Crestor 5 Mg Tabs (Rosuvastatin calcium) .Marland Kitchen... Take 1 tab by mouth daily.  Orders: Venipuncture (96295) TLB-Lipid Panel (80061-LIPID) TLB-CBC Platelet - w/Differential (85025-CBCD) TLB-BMP (Basic Metabolic Panel-BMET) (80048-METABOL) TLB-Hepatic/Liver Function Pnl (80076-HEPATIC) TLB-TSH (Thyroid Stimulating Hormone) (84443-TSH) Specimen Handling (28413) EKG w/ Interpretation (93000)  Problem # 4:  GERD (ICD-530.81)  Orders: EKG w/ Interpretation (93000)  Problem # 5:  FEMALE STRESS INCONTINENCE (ICD-625.6) vesicare Orders: EKG w/ Interpretation (93000)  Complete Medication List: 1)  Lisinopril-hydrochlorothiazide 20-25 Mg Tabs (Lisinopril-hydrochlorothiazide) .Marland Kitchen.. 1 by mouth once day 2)  Crestor 5 Mg Tabs (Rosuvastatin calcium) .... Take 1 tab by mouth daily. 3)  Vesicare 10 Mg Tabs (Solifenacin succinate) .Marland Kitchen.. 1 by mouth once daily  Other Orders: Tdap => 60yrs IM (24401) Admin 1st Vaccine (02725)  Patient Instructions: 1)  Please schedule a follow-up appointment in 6 months .  Prescriptions: CRESTOR 5 MG TABS (ROSUVASTATIN CALCIUM) take 1 tab by mouth daily.  #90 x 3   Entered and Authorized by:   Loreen Freud DO   Signed by:   Loreen Freud DO on 01/03/2011   Method used:   Print then Give to Patient   RxID:   [id number] LISINOPRIL-HYDROCHLOROTHIAZIDE 20-25 MG  TABS (LISINOPRIL-HYDROCHLOROTHIAZIDE) 1 by mouth once day  #90 x 3   Entered and Authorized by:   Loreen Freud DO  Signed by:   Loreen Freud DO on 01/03/2011   Method used:   Electronically to        [facility] [phone]* (retail)       [address]       [place], [place]  [address]        Ph: [phone]       Fax: [phone]   RxID:   [id number]    Orders Added: 1)  Tdap => 14yrs IM [90715] 2)  Admin 1st Vaccine [90471] 3)  Venipuncture [44010] 4)  TLB-Lipid Panel [80061-LIPID] 5)  TLB-CBC Platelet - w/Differential [85025-CBCD] 6)  TLB-BMP (Basic Metabolic Panel-BMET) [80048-METABOL] 7)  TLB-Hepatic/Liver Function Pnl [80076-HEPATIC] 8)  TLB-TSH (Thyroid Stimulating Hormone) [84443-TSH] 9)  Specimen Handling [99000] 10)  UA Dipstick W/ Micro (manual) [81000] 11)  Est. Patient 40-64 years [99396] 12)  EKG w/ Interpretation [93000]   Immunizations Administered:  Tetanus Vaccine:    Vaccine Type: Tdap    Site: right deltoid    Mfr: Merck    Dose: 0.5 ml    Route: IM    Given by: Almeta Monas CMA (AAMA)    Exp. Date: 09/15/2012    Lot #: [id number]    VIS given: 09/09/08 version given January 03, 2011.   Immunizations Administered:  Tetanus Vaccine:    Vaccine Type: Tdap    Site: right deltoid    Mfr: Merck    Dose: 0.5 ml    Route: IM    Given by: Almeta Monas CMA (AAMA)    Exp. Date: 09/15/2012    Lot #: [id number]    VIS given: 09/09/08 version given January 03, 2011.   Last Flu Vaccine:  Fluvax 3+ (08/11/2009 9:29:55 AM) Flu Vaccine Next Due:  Refused TD Result Date:  01/03/2011 TD Result:  given TD Next Due:  10 yr    Laboratory Results   Urine Tests   Date/Time Reported: January 03, 2011 1:06 PM   Routine Urinalysis   Color: yellow Appearance: Clear Glucose: negative   (Normal Range: Negative) Bilirubin: negative   (Normal Range: Negative) Ketone: negative   (Normal Range: Negative) Spec. Gravity: 1.020   (Normal Range: 1.003-1.035) Blood: negative   (Normal Range: Negative) pH: 5.0   (Normal Range: 5.0-8.0) Protein: negative   (Normal Range: Negative) Urobilinogen: negative   (Normal Range: 0-1) Nitrite: negative   (Normal Range: Negative) Leukocyte Esterace: negative   (Normal Range: Negative)    Comments: Floydene Flock  January 03, 2011 1:06 PM

## 2011-01-19 NOTE — Progress Notes (Signed)
Summary: Colonoscopy  spoke with patient and she stated she is going to call back with the information, stated she is in the process pt moving and has the information written down.   KPcma     ---- Converted from flag ---- ---- 01/04/2011 7:42 PM, Loreen Freud DO wrote: Let pt know it was not [place].  ---- 01/04/2011 5:35 PM, Almeta Monas CMA (AAMA) wrote: Pt has not seen [facility]  ---- 01/03/2011 10:07 AM, Loreen Freud DO wrote: Pt states she had colon in 2009 with  [id number] ---not in EMR ------------------------------

## 2011-02-13 ENCOUNTER — Encounter: Payer: Self-pay | Admitting: Family Medicine

## 2011-03-08 ENCOUNTER — Other Ambulatory Visit: Payer: Self-pay | Admitting: *Deleted

## 2011-03-08 MED ORDER — ROSUVASTATIN CALCIUM 5 MG PO TABS: 5.0000 mg | ORAL_TABLET | Freq: Every day | ORAL | Status: DC

## 2011-03-31 ENCOUNTER — Other Ambulatory Visit: Payer: Self-pay

## 2011-03-31 ENCOUNTER — Other Ambulatory Visit: Payer: Self-pay | Admitting: Family Medicine

## 2011-03-31 MED ORDER — ROSUVASTATIN CALCIUM 5 MG PO TABS: 5.0000 mg | ORAL_TABLET | Freq: Every day | ORAL | Status: DC

## 2011-03-31 NOTE — Telephone Encounter (Signed)
30 d supply faxed    KP

## 2011-03-31 NOTE — Telephone Encounter (Signed)
mssg left to call back     KP 

## 2011-03-31 NOTE — Telephone Encounter (Signed)
Rx Re-faxed.      KP 

## 2011-03-31 NOTE — Telephone Encounter (Signed)
Patient said that she had spoken to Selena Batten about a "letter"(??) regarding free Crestor---was under the impression that she would be able to pick up Crestor at [facility] and she wouldn't have to pay anything--patient needs phone call to explain this letter---may have to wait to get Crestor when she gets back in town

## 2011-04-05 MED ORDER — ROSUVASTATIN CALCIUM 5 MG PO TABS: 5.0000 mg | ORAL_TABLET | Freq: Every day | ORAL | Status: DC

## 2011-04-05 NOTE — Telephone Encounter (Signed)
Discussed with patient and she stated she will start the meds when she get home, she thought she was going to get Rx free. I advised we can give samples as discussed in our previous conversation which she misunderstood, and that was how she was going to get the R for free, since she was out of town and D.Lowne wanted her to start meds I had to send it to the pharmacy she requested... She voiced understanding. Rx sent [facility] [address]

## 2011-04-05 NOTE — Telephone Encounter (Signed)
Addended by: Arnette Norris on: 04/05/2011 04:04 PM   Modules accepted: Orders

## 2011-04-27 ENCOUNTER — Other Ambulatory Visit: Payer: Self-pay | Admitting: Family Medicine

## 2011-04-27 MED ORDER — SOLIFENACIN SUCCINATE 10 MG PO TABS: 10.0000 mg | ORAL_TABLET | Freq: Every day | ORAL | Status: DC

## 2011-04-27 NOTE — Telephone Encounter (Signed)
Patient is moving out of town and wants an Rx called to pharmacy--- left 3 samples at check in also    KP

## 2012-03-27 ENCOUNTER — Other Ambulatory Visit: Payer: Self-pay | Admitting: Family Medicine

## 2012-03-28 ENCOUNTER — Other Ambulatory Visit: Payer: Self-pay | Admitting: Family Medicine

## 2012-03-28 MED ORDER — LISINOPRIL-HYDROCHLOROTHIAZIDE 20-25 MG PO TABS: 1.0000 | ORAL_TABLET | Freq: Every day | ORAL | Status: DC

## 2012-03-28 NOTE — Telephone Encounter (Signed)
Discussed with patient-- CPE scheduled 30 day supply sent to th pharmacy.     KP

## 2012-03-28 NOTE — Telephone Encounter (Signed)
Patient called and stated she is working out of town and the pharmacy told her since she has no more refills she would have to call us to send over a new RX for Lisinopril HCTZ. I do not see on current or past medications nor is on patients snapshot  Please call patient at [phone]  Last ov 3.15.2012

## 2012-04-17 ENCOUNTER — Other Ambulatory Visit (HOSPITAL_COMMUNITY)
Admission: RE | Admit: 2012-04-17 | Discharge: 2012-04-17 | Disposition: A | Discharge status: Home / Self Care | Payer: PRIVATE HEALTH INSURANCE | Source: Ambulatory Visit | Attending: Family Medicine | Admitting: Family Medicine

## 2012-04-17 ENCOUNTER — Ambulatory Visit (INDEPENDENT_AMBULATORY_CARE_PROVIDER_SITE_OTHER): Payer: PRIVATE HEALTH INSURANCE | Admitting: Family Medicine

## 2012-04-17 ENCOUNTER — Encounter: Payer: Self-pay | Admitting: Family Medicine

## 2012-04-17 VITALS — BP 118/70 | HR 70 | Temp 98.4°F | Ht 68.5 in | Wt 234.0 lb

## 2012-04-17 DIAGNOSIS — Z1239 Encounter for other screening for malignant neoplasm of breast: Secondary | ICD-10-CM

## 2012-04-17 DIAGNOSIS — E785 Hyperlipidemia, unspecified: Secondary | ICD-10-CM

## 2012-04-17 DIAGNOSIS — Z124 Encounter for screening for malignant neoplasm of cervix: Secondary | ICD-10-CM

## 2012-04-17 DIAGNOSIS — Z01419 Encounter for gynecological examination (general) (routine) without abnormal findings: Secondary | ICD-10-CM | POA: Insufficient documentation

## 2012-04-17 DIAGNOSIS — B351 Tinea unguium: Secondary | ICD-10-CM

## 2012-04-17 DIAGNOSIS — Z0000 Encounter for general adult medical examination without abnormal findings: Secondary | ICD-10-CM

## 2012-04-17 DIAGNOSIS — N393 Stress incontinence (female) (male): Secondary | ICD-10-CM

## 2012-04-17 DIAGNOSIS — N6459 Other signs and symptoms in breast: Secondary | ICD-10-CM

## 2012-04-17 DIAGNOSIS — K219 Gastro-esophageal reflux disease without esophagitis: Secondary | ICD-10-CM

## 2012-04-17 DIAGNOSIS — I10 Essential (primary) hypertension: Secondary | ICD-10-CM

## 2012-04-17 LAB — CBC WITH DIFFERENTIAL/PLATELET
Basophils Absolute: 0 10*3/uL (ref 0.0–0.1)
Eosinophils Absolute: 0.1 10*3/uL (ref 0.0–0.7)
Lymphocytes Relative: 52.2 % — ABNORMAL HIGH (ref 12.0–46.0)
MCHC: 32.7 g/dL (ref 30.0–36.0)
Neutrophils Relative %: 37.4 % — ABNORMAL LOW (ref 43.0–77.0)
Platelets: 148 10*3/uL — ABNORMAL LOW (ref 150.0–400.0)
RDW: 14.4 % (ref 11.5–14.6)

## 2012-04-17 LAB — POCT URINALYSIS DIPSTICK
Glucose, UA: NEGATIVE
Nitrite, UA: NEGATIVE
Protein, UA: NEGATIVE
Spec Grav, UA: <=1.005
Urobilinogen, UA: 0.2

## 2012-04-17 LAB — BASIC METABOLIC PANEL
CO2: 29 mEq/L (ref 19–32)
Calcium: 9.6 mg/dL (ref 8.4–10.5)
Creatinine, Ser: 0.9 mg/dL (ref 0.4–1.2)
Glucose, Bld: 83 mg/dL (ref 70–99)

## 2012-04-17 LAB — HEPATIC FUNCTION PANEL
Albumin: 4 g/dL (ref 3.5–5.2)
Alkaline Phosphatase: 54 U/L (ref 39–117)
Bilirubin, Direct: 0 mg/dL (ref 0.0–0.3)

## 2012-04-17 LAB — LIPID PANEL
Cholesterol: 182 mg/dL (ref 0–200)
LDL Cholesterol: 102 mg/dL — ABNORMAL HIGH (ref 0–99)
Total CHOL/HDL Ratio: 3
VLDL: 17 mg/dL (ref 0.0–40.0)

## 2012-04-17 LAB — TSH: TSH: 2.28 u[IU]/mL (ref 0.35–5.50)

## 2012-04-17 MED ORDER — LISINOPRIL-HYDROCHLOROTHIAZIDE 20-25 MG PO TABS: 1.0000 | ORAL_TABLET | Freq: Every day | ORAL | Status: DC

## 2012-04-17 MED ORDER — TERBINAFINE HCL 250 MG PO TABS: 250.0000 mg | ORAL_TABLET | Freq: Every day | ORAL | Status: DC

## 2012-04-17 MED ORDER — SOLIFENACIN SUCCINATE 10 MG PO TABS: 10.0000 mg | ORAL_TABLET | Freq: Every day | ORAL | Status: DC

## 2012-04-17 NOTE — Assessment & Plan Note (Signed)
Check labs con't meds 

## 2012-04-17 NOTE — Assessment & Plan Note (Signed)
Stable

## 2012-04-17 NOTE — Patient Instructions (Addendum)
Preventive Care for Adults, Female A healthy lifestyle and preventive care can promote health and wellness. Preventive health guidelines for women include the following key practices.  A routine yearly physical is a good way to check with your caregiver about your health and preventive screening. It is a chance to share any concerns and updates on your health, and to receive a thorough exam.   Visit your dentist for a routine exam and preventive care every 6 months. Brush your teeth twice a day and floss once a day. Good oral hygiene prevents tooth decay and gum disease.   The frequency of eye exams is based on your age, health, family medical history, use of contact lenses, and other factors. Follow your caregiver's recommendations for frequency of eye exams.   Eat a healthy diet. Foods like vegetables, fruits, whole grains, low-fat dairy products, and lean protein foods contain the nutrients you need without too many calories. Decrease your intake of foods high in solid fats, added sugars, and salt. Eat the right amount of calories for you.Get information about a proper diet from your caregiver, if necessary.   Regular physical exercise is one of the most important things you can do for your health. Most adults should get at least 150 minutes of moderate-intensity exercise (any activity that increases your heart rate and causes you to sweat) each week. In addition, most adults need muscle-strengthening exercises on 2 or more days a week.   Maintain a healthy weight. The body mass index (BMI) is a screening tool to identify possible weight problems. It provides an estimate of body fat based on height and weight. Your caregiver can help determine your BMI, and can help you achieve or maintain a healthy weight.For adults 20 years and older:   A BMI below 18.5 is considered underweight.   A BMI of 18.5 to 24.9 is normal.   A BMI of 25 to 29.9 is considered overweight.   A BMI of 30 and above is  considered obese.   Maintain normal blood lipids and cholesterol levels by exercising and minimizing your intake of saturated fat. Eat a balanced diet with plenty of fruit and vegetables. Blood tests for lipids and cholesterol should begin at age 20 and be repeated every 5 years. If your lipid or cholesterol levels are high, you are over 50, or you are at high risk for heart disease, you may need your cholesterol levels checked more frequently.Ongoing high lipid and cholesterol levels should be treated with medicines if diet and exercise are not effective.   If you smoke, find out from your caregiver how to quit. If you do not use tobacco, do not start.   If you are pregnant, do not drink alcohol. If you are breastfeeding, be very cautious about drinking alcohol. If you are not pregnant and choose to drink alcohol, do not exceed 1 drink per day. One drink is considered to be 12 ounces (355 mL) of beer, 5 ounces (148 mL) of wine, or 1.5 ounces (44 mL) of liquor.   Avoid use of street drugs. Do not share needles with anyone. Ask for help if you need support or instructions about stopping the use of drugs.   High blood pressure causes heart disease and increases the risk of stroke. Your blood pressure should be checked at least every 1 to 2 years. Ongoing high blood pressure should be treated with medicines if weight loss and exercise are not effective.   If you are 55 to 60   years old, ask your caregiver if you should take aspirin to prevent strokes.   Diabetes screening involves taking a blood sample to check your fasting blood sugar level. This should be done once every 3 years, after age 45, if you are within normal weight and without risk factors for diabetes. Testing should be considered at a younger age or be carried out more frequently if you are overweight and have at least 1 risk factor for diabetes.   Breast cancer screening is essential preventive care for women. You should practice "breast  self-awareness." This means understanding the normal appearance and feel of your breasts and may include breast self-examination. Any changes detected, no matter how small, should be reported to a caregiver. Women in their 20s and 30s should have a clinical breast exam (CBE) by a caregiver as part of a regular health exam every 1 to 3 years. After age 40, women should have a CBE every year. Starting at age 40, women should consider having a mammography (breast X-ray test) every year. Women who have a family history of breast cancer should talk to their caregiver about genetic screening. Women at a high risk of breast cancer should talk to their caregivers about having magnetic resonance imaging (MRI) and a mammography every year.   The Pap test is a screening test for cervical cancer. A Pap test can show cell changes on the cervix that might become cervical cancer if left untreated. A Pap test is a procedure in which cells are obtained and examined from the lower end of the uterus (cervix).   Women should have a Pap test starting at age 21.   Between ages 21 and 29, Pap tests should be repeated every 2 years.   Beginning at age 30, you should have a Pap test every 3 years as long as the past 3 Pap tests have been normal.   Some women have medical problems that increase the chance of getting cervical cancer. Talk to your caregiver about these problems. It is especially important to talk to your caregiver if a new problem develops soon after your last Pap test. In these cases, your caregiver may recommend more frequent screening and Pap tests.   The above recommendations are the same for women who have or have not gotten the vaccine for human papillomavirus (HPV).   If you had a hysterectomy for a problem that was not cancer or a condition that could lead to cancer, then you no longer need Pap tests. Even if you no longer need a Pap test, a regular exam is a good idea to make sure no other problems are  starting.   If you are between ages 65 and 70, and you have had normal Pap tests going back 10 years, you no longer need Pap tests. Even if you no longer need a Pap test, a regular exam is a good idea to make sure no other problems are starting.   If you have had past treatment for cervical cancer or a condition that could lead to cancer, you need Pap tests and screening for cancer for at least 20 years after your treatment.   If Pap tests have been discontinued, risk factors (such as a new sexual partner) need to be reassessed to determine if screening should be resumed.   The HPV test is an additional test that may be used for cervical cancer screening. The HPV test looks for the virus that can cause the cell changes on the cervix.   The cells collected during the Pap test can be tested for HPV. The HPV test could be used to screen women aged 30 years and older, and should be used in women of any age who have unclear Pap test results. After the age of 30, women should have HPV testing at the same frequency as a Pap test.   Colorectal cancer can be detected and often prevented. Most routine colorectal cancer screening begins at the age of 50 and continues through age 75. However, your caregiver may recommend screening at an earlier age if you have risk factors for colon cancer. On a yearly basis, your caregiver may provide home test kits to check for hidden blood in the stool. Use of a small camera at the end of a tube, to directly examine the colon (sigmoidoscopy or colonoscopy), can detect the earliest forms of colorectal cancer. Talk to your caregiver about this at age 50, when routine screening begins. Direct examination of the colon should be repeated every 5 to 10 years through age 75, unless early forms of pre-cancerous polyps or small growths are found.   Hepatitis C blood testing is recommended for all people born from 1945 through 1965 and any individual with known risks for hepatitis C.    Practice safe sex. Use condoms and avoid high-risk sexual practices to reduce the spread of sexually transmitted infections (STIs). STIs include gonorrhea, chlamydia, syphilis, trichomonas, herpes, HPV, and human immunodeficiency virus (HIV). Herpes, HIV, and HPV are viral illnesses that have no cure. They can result in disability, cancer, and death. Sexually active women aged 25 and younger should be checked for chlamydia. Older women with new or multiple partners should also be tested for chlamydia. Testing for other STIs is recommended if you are sexually active and at increased risk.   Osteoporosis is a disease in which the bones lose minerals and strength with aging. This can result in serious bone fractures. The risk of osteoporosis can be identified using a bone density scan. Women ages 65 and over and women at risk for fractures or osteoporosis should discuss screening with their caregivers. Ask your caregiver whether you should take a calcium supplement or vitamin D to reduce the rate of osteoporosis.   Menopause can be associated with physical symptoms and risks. Hormone replacement therapy is available to decrease symptoms and risks. You should talk to your caregiver about whether hormone replacement therapy is right for you.   Use sunscreen with sun protection factor (SPF) of 30 or more. Apply sunscreen liberally and repeatedly throughout the day. You should seek shade when your shadow is shorter than you. Protect yourself by wearing long sleeves, pants, a wide-brimmed hat, and sunglasses year round, whenever you are outdoors.   Once a month, do a whole body skin exam, using a mirror to look at the skin on your back. Notify your caregiver of new moles, moles that have irregular borders, moles that are larger than a pencil eraser, or moles that have changed in shape or color.   Stay current with required immunizations.   Influenza. You need a dose every fall (or winter). The composition of  the flu vaccine changes each year, so being vaccinated once is not enough.   Pneumococcal polysaccharide. You need 1 to 2 doses if you smoke cigarettes or if you have certain chronic medical conditions. You need 1 dose at age 65 (or older) if you have never been vaccinated.   Tetanus, diphtheria, pertussis (Tdap, Td). Get 1 dose of   Tdap vaccine if you are younger than age 65, are over 65 and have contact with an infant, are a healthcare worker, are pregnant, or simply want to be protected from whooping cough. After that, you need a Td booster dose every 10 years. Consult your caregiver if you have not had at least 3 tetanus and diphtheria-containing shots sometime in your life or have a deep or dirty wound.   HPV. You need this vaccine if you are a woman age 26 or younger. The vaccine is given in 3 doses over 6 months.   Measles, mumps, rubella (MMR). You need at least 1 dose of MMR if you were born in 1957 or later. You may also need a second dose.   Meningococcal. If you are age 19 to 21 and a first-year college student living in a residence hall, or have one of several medical conditions, you need to get vaccinated against meningococcal disease. You may also need additional booster doses.   Zoster (shingles). If you are age 60 or older, you should get this vaccine.   Varicella (chickenpox). If you have never had chickenpox or you were vaccinated but received only 1 dose, talk to your caregiver to find out if you need this vaccine.   Hepatitis A. You need this vaccine if you have a specific risk factor for hepatitis A virus infection or you simply wish to be protected from this disease. The vaccine is usually given as 2 doses, 6 to 18 months apart.   Hepatitis B. You need this vaccine if you have a specific risk factor for hepatitis B virus infection or you simply wish to be protected from this disease. The vaccine is given in 3 doses, usually over 6 months.  Preventive Services /  Frequency Ages 19 to 39  Blood pressure check.** / Every 1 to 2 years.   Lipid and cholesterol check.** / Every 5 years beginning at age 20.   Clinical breast exam.** / Every 3 years for women in their 20s and 30s.   Pap test.** / Every 2 years from ages 21 through 29. Every 3 years starting at age 30 through age 65 or 70 with a history of 3 consecutive normal Pap tests.   HPV screening.** / Every 3 years from ages 30 through ages 65 to 70 with a history of 3 consecutive normal Pap tests.   Hepatitis C blood test.** / For any individual with known risks for hepatitis C.   Skin self-exam. / Monthly.   Influenza immunization.** / Every year.   Pneumococcal polysaccharide immunization.** / 1 to 2 doses if you smoke cigarettes or if you have certain chronic medical conditions.   Tetanus, diphtheria, pertussis (Tdap, Td) immunization. / A one-time dose of Tdap vaccine. After that, you need a Td booster dose every 10 years.   HPV immunization. / 3 doses over 6 months, if you are 26 and younger.   Measles, mumps, rubella (MMR) immunization. / You need at least 1 dose of MMR if you were born in 1957 or later. You may also need a second dose.   Meningococcal immunization. / 1 dose if you are age 19 to 21 and a first-year college student living in a residence hall, or have one of several medical conditions, you need to get vaccinated against meningococcal disease. You may also need additional booster doses.   Varicella immunization.** / Consult your caregiver.   Hepatitis A immunization.** / Consult your caregiver. 2 doses, 6 to 18 months   apart.   Hepatitis B immunization.** / Consult your caregiver. 3 doses usually over 6 months.  Ages 40 to 64  Blood pressure check.** / Every 1 to 2 years.   Lipid and cholesterol check.** / Every 5 years beginning at age 20.   Clinical breast exam.** / Every year after age 40.   Mammogram.** / Every year beginning at age 40 and continuing for as  long as you are in good health. Consult with your caregiver.   Pap test.** / Every 3 years starting at age 30 through age 65 or 70 with a history of 3 consecutive normal Pap tests.   HPV screening.** / Every 3 years from ages 30 through ages 65 to 70 with a history of 3 consecutive normal Pap tests.   Fecal occult blood test (FOBT) of stool. / Every year beginning at age 50 and continuing until age 75. You may not need to do this test if you get a colonoscopy every 10 years.   Flexible sigmoidoscopy or colonoscopy.** / Every 5 years for a flexible sigmoidoscopy or every 10 years for a colonoscopy beginning at age 50 and continuing until age 75.   Hepatitis C blood test.** / For all people born from 1945 through 1965 and any individual with known risks for hepatitis C.   Skin self-exam. / Monthly.   Influenza immunization.** / Every year.   Pneumococcal polysaccharide immunization.** / 1 to 2 doses if you smoke cigarettes or if you have certain chronic medical conditions.   Tetanus, diphtheria, pertussis (Tdap, Td) immunization.** / A one-time dose of Tdap vaccine. After that, you need a Td booster dose every 10 years.   Measles, mumps, rubella (MMR) immunization. / You need at least 1 dose of MMR if you were born in 1957 or later. You may also need a second dose.   Varicella immunization.** / Consult your caregiver.   Meningococcal immunization.** / Consult your caregiver.   Hepatitis A immunization.** / Consult your caregiver. 2 doses, 6 to 18 months apart.   Hepatitis B immunization.** / Consult your caregiver. 3 doses, usually over 6 months.  Ages 65 and over  Blood pressure check.** / Every 1 to 2 years.   Lipid and cholesterol check.** / Every 5 years beginning at age 20.   Clinical breast exam.** / Every year after age 40.   Mammogram.** / Every year beginning at age 40 and continuing for as long as you are in good health. Consult with your caregiver.   Pap test.** /  Every 3 years starting at age 30 through age 65 or 70 with a 3 consecutive normal Pap tests. Testing can be stopped between 65 and 70 with 3 consecutive normal Pap tests and no abnormal Pap or HPV tests in the past 10 years.   HPV screening.** / Every 3 years from ages 30 through ages 65 or 70 with a history of 3 consecutive normal Pap tests. Testing can be stopped between 65 and 70 with 3 consecutive normal Pap tests and no abnormal Pap or HPV tests in the past 10 years.   Fecal occult blood test (FOBT) of stool. / Every year beginning at age 50 and continuing until age 75. You may not need to do this test if you get a colonoscopy every 10 years.   Flexible sigmoidoscopy or colonoscopy.** / Every 5 years for a flexible sigmoidoscopy or every 10 years for a colonoscopy beginning at age 50 and continuing until age 75.   Hepatitis   C blood test.** / For all people born from 1945 through 1965 and any individual with known risks for hepatitis C.   Osteoporosis screening.** / A one-time screening for women ages 65 and over and women at risk for fractures or osteoporosis.   Skin self-exam. / Monthly.   Influenza immunization.** / Every year.   Pneumococcal polysaccharide immunization.** / 1 dose at age 65 (or older) if you have never been vaccinated.   Tetanus, diphtheria, pertussis (Tdap, Td) immunization. / A one-time dose of Tdap vaccine if you are over 65 and have contact with an infant, are a healthcare worker, or simply want to be protected from whooping cough. After that, you need a Td booster dose every 10 years.   Varicella immunization.** / Consult your caregiver.   Meningococcal immunization.** / Consult your caregiver.   Hepatitis A immunization.** / Consult your caregiver. 2 doses, 6 to 18 months apart.   Hepatitis B immunization.** / Check with your caregiver. 3 doses, usually over 6 months.  ** Family history and personal history of risk and conditions may change your caregiver's  recommendations. Document Released: 12/05/2001 Document Revised: 09/28/2011 Document Reviewed: 03/06/2011 [employer] Patient Information 2012 [employer]. 

## 2012-04-17 NOTE — Progress Notes (Signed)
Subjective:     Mary English is a 61 y.o. female and is here for a comprehensive physical exam. The patient reports no problems.  History   Social History  . Marital Status: Divorced    Spouse Name: N/A    Number of Children: N/A  . Years of Education: N/A   Occupational History  . unemployed    Social History Main Topics  . Smoking status: Never Smoker   . Smokeless tobacco: Never Used  . Alcohol Use: No  . Drug Use: No  . Sexually Active: Not Currently -- Female partner(s)   Other Topics Concern  . Not on file   Social History Narrative   Exercise-- walking 2x a week   Health Maintenance  Topic Date Due  . Influenza Vaccine  07/23/2012  . Mammogram  01/09/2013  . Pap Smear  04/18/2015  . Colonoscopy  12/07/2016  . Tetanus/tdap  01/02/2021    The following portions of the patient's history were reviewed and updated as appropriate: allergies, current medications, past family history, past medical history, past social history, past surgical history and problem list.  Review of Systems Review of Systems  Constitutional: Negative for activity change, appetite change and fatigue.  HENT: Negative for hearing loss, congestion, tinnitus and ear discharge.  dentist q1y Eyes: Negative for visual disturbance (see optho q1y -- vision corrected to 20/20 with glasses).  Respiratory: Negative for cough, chest tightness and shortness of breath.   Cardiovascular: Negative for chest pain, palpitations and leg swelling.  Gastrointestinal: Negative for abdominal pain, diarrhea, constipation and abdominal distention.  Genitourinary: Negative for urgency, frequency, decreased urine volume and difficulty urinating.  Musculoskeletal: Negative for back pain, arthralgias and gait problem.  Skin: Negative for color change, pallor and rash.  Neurological: Negative for dizziness, light-headedness, numbness and headaches.  Hematological: Negative for adenopathy. Does not bruise/bleed easily.    Psychiatric/Behavioral: Negative for suicidal ideas, confusion, sleep disturbance, self-injury, dysphoric mood, decreased concentration and agitation.       Objective:    BP 118/70  Pulse 70  Temp 98.4 F (36.9 C) (Oral)  Ht 5' 8.5" (1.74 m)  Wt 234 lb (106.142 kg)  BMI 35.06 kg/m2  SpO2 98% General appearance: alert, cooperative, appears stated age and no distress Head: Normocephalic, without obvious abnormality, atraumatic Eyes: conjunctivae/corneas clear. PERRL, EOM's intact. Fundi benign. Ears: normal TM's and external ear canals both ears Nose: Nares normal. Septum midline. Mucosa normal. No drainage or sinus tenderness. Throat: lips, mucosa, and tongue normal; teeth and gums normal Neck: no adenopathy, no carotid bruit, no JVD, supple, symmetrical, trachea midline and thyroid not enlarged, symmetric, no tenderness/mass/nodules Back: symmetric, no curvature. ROM normal. No CVA tenderness. Lungs: clear to auscultation bilaterally Breasts: normal appearance, no masses or tenderness Heart: regular rate and rhythm, S1, S2 normal, no murmur, click, rub or gallop Abdomen: soft, non-tender; bowel sounds normal; no masses,  no organomegaly Pelvic: cervix normal in appearance, external genitalia normal, no adnexal masses or tenderness, no cervical motion tenderness, rectovaginal septum normal, uterus normal size, shape, and consistency and vagina normal without discharge Extremities: extremities normal, atraumatic, no cyanosis or edema Pulses: 2+ and symmetric Skin: Skin color, texture, turgor normal. No rashes or lesions Lymph nodes: Cervical, supraclavicular, and axillary nodes normal. Neurologic: Alert and oriented X 3, normal strength and tone. Normal symmetric reflexes. Normal coordination and gait psych-- no depression, no anxiety    Assessment:    Healthy female exam.  HTN hyperlipidemia  Plan:    [facility] utd Check fasting labs See After Visit Summary for Counseling  Recommendations

## 2012-04-17 NOTE — Assessment & Plan Note (Signed)
Stable con't meds 

## 2012-04-18 ENCOUNTER — Encounter: Payer: Self-pay | Admitting: Internal Medicine

## 2012-04-24 ENCOUNTER — Ambulatory Visit
Admission: RE | Admit: 2012-04-24 | Discharge: 2012-04-24 | Disposition: A | Discharge status: Home / Self Care | Payer: PRIVATE HEALTH INSURANCE | Source: Ambulatory Visit | Attending: Family Medicine | Admitting: Family Medicine

## 2012-04-24 ENCOUNTER — Other Ambulatory Visit: Payer: Self-pay | Admitting: Family Medicine

## 2012-04-24 DIAGNOSIS — N6459 Other signs and symptoms in breast: Secondary | ICD-10-CM

## 2012-05-09 ENCOUNTER — Telehealth: Payer: Self-pay | Admitting: *Deleted

## 2012-05-09 MED ORDER — ROSUVASTATIN CALCIUM 5 MG PO TABS: ORAL_TABLET | ORAL | Status: DC

## 2012-05-09 NOTE — Telephone Encounter (Signed)
Pt left VM that she would like a call back to discuss refill. Left message to call office to get further details.

## 2012-05-09 NOTE — Telephone Encounter (Signed)
Pt return call need refill on crestor. Pt aware.Rx sent

## 2012-05-10 ENCOUNTER — Other Ambulatory Visit (INDEPENDENT_AMBULATORY_CARE_PROVIDER_SITE_OTHER): Payer: PRIVATE HEALTH INSURANCE

## 2012-05-10 DIAGNOSIS — B351 Tinea unguium: Secondary | ICD-10-CM

## 2012-05-10 LAB — HEPATIC FUNCTION PANEL
AST: 20 U/L (ref 0–37)
Albumin: 3.9 g/dL (ref 3.5–5.2)
Total Bilirubin: 0.2 mg/dL — ABNORMAL LOW (ref 0.3–1.2)

## 2012-05-10 NOTE — Progress Notes (Unsigned)
Labs only

## 2012-05-13 ENCOUNTER — Other Ambulatory Visit: Payer: PRIVATE HEALTH INSURANCE

## 2012-05-14 ENCOUNTER — Encounter: Payer: Self-pay | Admitting: *Deleted

## 2012-06-03 ENCOUNTER — Telehealth: Payer: Self-pay | Admitting: Family Medicine

## 2012-06-03 NOTE — Telephone Encounter (Signed)
needs to speak with you woukld not tell me why just stated "JUST HAVE HER CALL ME BACK" Advised pt., I would send you a phone note and you would call her when you get time Cb# [phone]

## 2012-06-03 NOTE — Telephone Encounter (Signed)
Called patient and she said to disregard the call she went to occupational health and took care of the forms and the PPD.     KP

## 2012-06-10 ENCOUNTER — Encounter: Payer: PRIVATE HEALTH INSURANCE | Admitting: Internal Medicine

## 2012-07-19 ENCOUNTER — Encounter: Payer: Self-pay | Admitting: Family Medicine

## 2012-07-19 ENCOUNTER — Ambulatory Visit (INDEPENDENT_AMBULATORY_CARE_PROVIDER_SITE_OTHER): Payer: BC Managed Care – PPO | Admitting: Family Medicine

## 2012-07-19 VITALS — BP 114/74 | HR 68 | Temp 98.4°F | Wt 231.8 lb

## 2012-07-19 DIAGNOSIS — M791 Myalgia, unspecified site: Secondary | ICD-10-CM

## 2012-07-19 DIAGNOSIS — E785 Hyperlipidemia, unspecified: Secondary | ICD-10-CM

## 2012-07-19 DIAGNOSIS — IMO0001 Reserved for inherently not codable concepts without codable children: Secondary | ICD-10-CM

## 2012-07-19 DIAGNOSIS — M79672 Pain in left foot: Secondary | ICD-10-CM

## 2012-07-19 DIAGNOSIS — I10 Essential (primary) hypertension: Secondary | ICD-10-CM

## 2012-07-19 DIAGNOSIS — M79609 Pain in unspecified limb: Secondary | ICD-10-CM

## 2012-07-19 DIAGNOSIS — Z23 Encounter for immunization: Secondary | ICD-10-CM

## 2012-07-19 NOTE — Progress Notes (Signed)
  Subjective:    Patient ID: Mary English, female    DOB: Jun 03, 1951, 61 y.o.   MRN: [id number]  HPI Pt here c/o myalgia that she thinks is from crestor.        Review of Systems    as above Objective:   Physical Exam  Constitutional: She is oriented to person, place, and time. She appears well-developed and well-nourished.  Neck: Normal range of motion. Neck supple.  Cardiovascular: Normal rate and regular rhythm.   Pulmonary/Chest: Effort normal and breath sounds normal. No respiratory distress. She has no wheezes. She has no rales.  Neurological: She is alert and oriented to person, place, and time.  Psychiatric: She has a normal mood and affect. Her behavior is normal. Judgment and thought content normal.          Assessment & Plan:

## 2012-07-21 NOTE — Assessment & Plan Note (Signed)
Stable con't meds 

## 2012-07-21 NOTE — Assessment & Plan Note (Signed)
Hold statin We will need to try something else if myalgias resolve

## 2012-07-26 ENCOUNTER — Telehealth: Payer: Self-pay | Admitting: Family Medicine

## 2012-07-26 NOTE — Telephone Encounter (Signed)
Pt called and she say the referred podiatrist but now would like to see a neurologist. She would like to talk with you, Mary English

## 2012-07-26 NOTE — Telephone Encounter (Signed)
msg left to call the office     KP 

## 2012-08-01 NOTE — Telephone Encounter (Signed)
msg left    KP

## 2012-08-09 NOTE — Telephone Encounter (Signed)
Spoke with patient and she stated she is having issues with the pain in her legs (back part of the upper thigh area) that is radiating down to legs and she is requesting to see a neurologist.  She stopped the Crestor but not having much relief. Minimum documentation on the chart in reference to this apt scheduled with Dr.Tabori on 10/23 at 4 pm.      KP

## 2012-08-14 ENCOUNTER — Ambulatory Visit (INDEPENDENT_AMBULATORY_CARE_PROVIDER_SITE_OTHER): Payer: BC Managed Care – PPO | Admitting: Family Medicine

## 2012-08-14 ENCOUNTER — Encounter: Payer: Self-pay | Admitting: Family Medicine

## 2012-08-14 VITALS — BP 127/82 | HR 72 | Temp 98.3°F | Ht 69.0 in | Wt 232.3 lb

## 2012-08-14 DIAGNOSIS — R209 Unspecified disturbances of skin sensation: Secondary | ICD-10-CM

## 2012-08-14 NOTE — Patient Instructions (Addendum)
We'll call you with your neuro appt The good news is that it's not happening regularly Call with any questions or concerns Hang in there!!

## 2012-08-14 NOTE — Progress Notes (Signed)
  Subjective:    Patient ID: Mary English, female    DOB: 05/30/1951, 61 y.o.   MRN: [id number]  HPI Bilateral leg pain- started in Feb.  sxs start mid hamstring and radiate down to ankles.  Would feel very weak after pain spontaneously resolved, 'like it took a lot out of me'.  Saw Dr Laury Axon in June about this issue.  Pt planned to stop statin b/c brother had similar problems.  Stopped Crestor and Vesicare first of June and sxs have decreased in frequency since then.  Has had 2 episodes since June- 1 occuring in arms and 1 in legs (2 weeks ago).  Pt working on concrete floors, on feet all day.  Having foot pain- is planning on orthotics w/ podiatry.  Pain is described as a 'sharp cold pain rushing down'.  Episodes will last 10-15 seconds.   Review of Systems For ROS see HPI     Objective:   Physical Exam  Vitals reviewed. Constitutional: She is oriented to person, place, and time. She appears well-developed and well-nourished. No distress.  HENT:  Head: Normocephalic and atraumatic.  Eyes: Conjunctivae normal and EOM are normal. Pupils are equal, round, and reactive to light.  Neck: Normal range of motion. Neck supple. No thyromegaly present.  Cardiovascular: Normal rate, regular rhythm, normal heart sounds and intact distal pulses.   No murmur heard. Pulmonary/Chest: Effort normal and breath sounds normal. No respiratory distress.  Abdominal: Soft. She exhibits no distension. There is no tenderness.  Musculoskeletal: She exhibits no edema.  Lymphadenopathy:    She has no cervical adenopathy.  Neurological: She is alert and oriented to person, place, and time. She has normal reflexes. No cranial nerve deficit. Coordination normal.       (-) SLR bilaterally No TTP over trigger points bilaterally Normal strength  Skin: Skin is warm and dry.  Psychiatric: She has a normal mood and affect. Her behavior is normal.          Assessment & Plan:

## 2012-08-15 LAB — CBC WITH DIFFERENTIAL/PLATELET
Basophils Absolute: 0 10*3/uL (ref 0.0–0.1)
Basophils Relative: 0.8 % (ref 0.0–3.0)
Eosinophils Absolute: 0.1 10*3/uL (ref 0.0–0.7)
Lymphocytes Relative: 41.5 % (ref 12.0–46.0)
MCHC: 31.8 g/dL (ref 30.0–36.0)
Monocytes Relative: 9.1 % (ref 3.0–12.0)
Neutrophils Relative %: 46.9 % (ref 43.0–77.0)
RBC: 4.44 Mil/uL (ref 3.87–5.11)
RDW: 14.1 % (ref 11.5–14.6)

## 2012-08-16 ENCOUNTER — Encounter: Payer: Self-pay | Admitting: *Deleted

## 2012-08-16 LAB — FOLATE: Folate: 9.7 ng/mL (ref >5.9)

## 2012-08-16 LAB — VITAMIN B12: Vitamin B-12: 384 pg/mL (ref 211–911)

## 2012-08-25 NOTE — Assessment & Plan Note (Addendum)
New to provider.  Pt asymptomatic today.  Check labs for possible causes.  Pt has only had 2 momentary episodes in 4+ months.  Pt would like to have neuro appt.  Referral made.  Reviewed supportive care and red flags that should prompt return.  Pt expressed understanding and is in agreement w/ plan.

## 2012-10-28 ENCOUNTER — Ambulatory Visit: Payer: PRIVATE HEALTH INSURANCE | Admitting: Family Medicine

## 2012-12-04 ENCOUNTER — Ambulatory Visit (INDEPENDENT_AMBULATORY_CARE_PROVIDER_SITE_OTHER): Payer: BC Managed Care – PPO | Admitting: Family Medicine

## 2012-12-04 ENCOUNTER — Encounter: Payer: Self-pay | Admitting: Family Medicine

## 2012-12-04 VITALS — BP 114/60 | HR 81 | Temp 98.3°F | Wt 236.2 lb

## 2012-12-04 DIAGNOSIS — I10 Essential (primary) hypertension: Secondary | ICD-10-CM

## 2012-12-04 DIAGNOSIS — J321 Chronic frontal sinusitis: Secondary | ICD-10-CM

## 2012-12-04 MED ORDER — CEFUROXIME AXETIL 500 MG PO TABS: 500.0000 mg | ORAL_TABLET | Freq: Two times a day (BID) | ORAL | Status: AC

## 2012-12-04 MED ORDER — MOMETASONE FUROATE 50 MCG/ACT NA SUSP: 2.0000 | Freq: Every day | NASAL | Status: DC

## 2012-12-04 MED ORDER — LISINOPRIL-HYDROCHLOROTHIAZIDE 20-25 MG PO TABS: 1.0000 | ORAL_TABLET | Freq: Every day | ORAL | Status: DC

## 2012-12-04 NOTE — Patient Instructions (Signed)

## 2012-12-04 NOTE — Progress Notes (Signed)
  Subjective:     Mary English is a 62 y.o. female who presents for evaluation of sinus pain. Symptoms include: congestion, facial pain, headaches, nasal congestion and sinus pressure. Onset of symptoms was 2 weeks ago. Symptoms have been gradually worsening since that time. Past history is significant for no history of pneumonia or bronchitis. Patient is a non-smoker.  The following portions of the patient's history were reviewed and updated as appropriate: allergies, current medications, past family history, past medical history, past social history, past surgical history and problem list.  Review of Systems Pertinent items are noted in HPI.   Objective:    BP 114/60  Pulse 81  Temp(Src) 98.3 F (36.8 C) (Oral)  Wt 236 lb 3.2 oz (107.14 kg)  BMI 34.86 kg/m2  SpO2 97% General appearance: alert, cooperative, appears stated age and no distress Ears: normal TM's and external ear canals both ears Nose: green discharge, moderate congestion, turbinates red, swollen, sinus tenderness bilateral Throat: lips, mucosa, and tongue normal; teeth and gums normal Neck: mild anterior cervical adenopathy, supple, symmetrical, trachea midline and thyroid not enlarged, symmetric, no tenderness/mass/nodules Lungs: clear to auscultation bilaterally Heart: S1, S2 normal    Assessment:    Acute bacterial sinusitis.    Plan:    Nasal steroids per medication orders. Antihistamines per medication orders. Ceftin per medication orders.

## 2013-04-16 ENCOUNTER — Other Ambulatory Visit: Payer: Self-pay

## 2013-04-16 DIAGNOSIS — Z1231 Encounter for screening mammogram for malignant neoplasm of breast: Secondary | ICD-10-CM

## 2013-05-01 ENCOUNTER — Ambulatory Visit: Payer: BC Managed Care – PPO

## 2013-05-07 ENCOUNTER — Ambulatory Visit: Payer: BC Managed Care – PPO

## 2013-05-21 ENCOUNTER — Ambulatory Visit
Admission: RE | Admit: 2013-05-21 | Discharge: 2013-05-21 | Disposition: A | Discharge status: Home / Self Care | Payer: BC Managed Care – PPO | Source: Ambulatory Visit

## 2013-05-21 DIAGNOSIS — Z1231 Encounter for screening mammogram for malignant neoplasm of breast: Secondary | ICD-10-CM

## 2013-06-11 ENCOUNTER — Encounter: Payer: Self-pay | Admitting: Family Medicine

## 2013-06-18 ENCOUNTER — Other Ambulatory Visit: Payer: Self-pay | Admitting: Family Medicine

## 2013-07-01 ENCOUNTER — Ambulatory Visit (INDEPENDENT_AMBULATORY_CARE_PROVIDER_SITE_OTHER): Payer: BC Managed Care – PPO | Admitting: Family Medicine

## 2013-07-01 ENCOUNTER — Encounter: Payer: Self-pay | Admitting: Family Medicine

## 2013-07-01 VITALS — BP 116/74 | HR 70 | Temp 98.4°F | Wt 243.4 lb

## 2013-07-01 DIAGNOSIS — N898 Other specified noninflammatory disorders of vagina: Secondary | ICD-10-CM

## 2013-07-01 DIAGNOSIS — R300 Dysuria: Secondary | ICD-10-CM

## 2013-07-01 DIAGNOSIS — R319 Hematuria, unspecified: Secondary | ICD-10-CM

## 2013-07-01 LAB — BASIC METABOLIC PANEL
Chloride: 107 mEq/L (ref 96–112)
Potassium: 3.5 mEq/L (ref 3.5–5.1)

## 2013-07-01 LAB — HEPATIC FUNCTION PANEL
ALT: 17 U/L (ref 0–35)
AST: 19 U/L (ref 0–37)
Bilirubin, Direct: 0 mg/dL (ref 0.0–0.3)
Total Protein: 7.2 g/dL (ref 6.0–8.3)

## 2013-07-01 LAB — POCT URINALYSIS DIPSTICK
Ketones, UA: NEGATIVE
Protein, UA: NEGATIVE
Spec Grav, UA: 1.02
pH, UA: 6

## 2013-07-01 LAB — CBC WITH DIFFERENTIAL/PLATELET
Basophils Relative: 0.5 % (ref 0.0–3.0)
Eosinophils Relative: 1 % (ref 0.0–5.0)
HCT: 38.5 % (ref 36.0–46.0)
Lymphs Abs: 2.5 10*3/uL (ref 0.7–4.0)
MCV: 83.5 fl (ref 78.0–100.0)
Monocytes Absolute: 0.5 10*3/uL (ref 0.1–1.0)
Monocytes Relative: 7.1 % (ref 3.0–12.0)
Neutrophils Relative %: 53.4 % (ref 43.0–77.0)
RBC: 4.61 Mil/uL (ref 3.87–5.11)
WBC: 6.6 10*3/uL (ref 4.5–10.5)

## 2013-07-01 MED ORDER — METRONIDAZOLE 0.75 % VA GEL: VAGINAL | Status: AC

## 2013-07-01 MED ORDER — CIPROFLOXACIN HCL 500 MG PO TABS: 500.0000 mg | ORAL_TABLET | Freq: Two times a day (BID) | ORAL | Status: AC

## 2013-07-01 NOTE — Progress Notes (Signed)
  Subjective:    Mary English is a 62 y.o. female who complains of burning with urination, erythema of vaginal area, frequency, hematuria, urgency and vaginal d/c with headache and hot flashes.. She has had symptoms for 2 days. Patient also complains of headache and vaginal discharge. Patient denies back pain, congestion, cough, fever, rhinitis, sorethroat and stomach ache. Patient does not have a history of recurrent UTI. Patient does not have a history of pyelonephritis.   The following portions of the patient's history were reviewed and updated as appropriate: allergies, current medications, past family history, past medical history, past social history, past surgical history and problem list.  Review of Systems Pertinent items are noted in HPI.    Objective:    BP 116/74  Pulse 70  Temp(Src) 98.4 F (36.9 C) (Oral)  Wt 243 lb 6.4 oz (110.406 kg)  BMI 35.93 kg/m2  SpO2 97% General appearance: alert, cooperative, appears stated age and no distress Abdomen: soft, non-tender; bowel sounds normal; no masses,  no organomegaly Pelvic: no adnexal masses or tenderness and positive findings: positive whiff test or vaginal discharge:  scant, yellow and malodorous Extremities: extremities normal, atraumatic, no cyanosis or edema  Laboratory:  Urine dipstick: 2+ for hemoglobin and 2+ for leukocyte esterase.   Micro exam: not done.    Assessment:    vaginal bleeding and urinary frequency     Plan:  Wet prep done--+ whiff metrogel vag  Medications: ciprofloxacin. Maintain adequate hydration. Follow up if symptoms not improving, and as needed.  If everything neg- and bleeding cont we will refer to gyn

## 2013-07-01 NOTE — Patient Instructions (Addendum)
Hematuria, Adult Hematuria (blood in your urine) can be caused by a bladder infection (cystitis), kidney infection (pyelonephritis), prostate infection (prostatitis), or kidney stone. Infections will usually respond to antibiotics (medications which kill germs), and a kidney stone will usually pass through your urine without further treatment. If you were put on antibiotics, take all the medicine until gone. You may feel better in a few days, but take all of your medicine or the infection may not respond and become more difficult to treat. If antibiotics were not given, an infection did not cause the blood in the urine. A further work up to find out the reason may be needed. HOME CARE INSTRUCTIONS   Drink lots of fluid, 3 to 4 quarts a day. If you have been diagnosed with an infection, cranberry juice is especially recommended, in addition to large amounts of water.  Avoid caffeine, tea, and carbonated beverages, because they tend to irritate the bladder.  Avoid alcohol as it may irritate the prostate.  Only take over-the-counter or prescription medicines for pain, discomfort, or fever as directed by your caregiver.  If you have been diagnosed with a kidney stone follow your caregivers instructions regarding straining your urine to catch the stone. TO PREVENT FURTHER INFECTIONS:  Empty the bladder often. Avoid holding urine for long periods of time.  After a bowel movement, women should cleanse front to back. Use each tissue only once.  Empty the bladder before and after sexual intercourse if you are a female.  Return to your caregiver if you develop back pain, fever, nausea (feeling sick to your stomach), vomiting, or your symptoms (problems) are not better in 3 days. Return sooner if you are getting worse. If you have been requested to return for further testing make sure to keep your appointments. If an infection is not the cause of blood in your urine, X-rays may be required. Your caregiver  will discuss this with you. SEEK IMMEDIATE MEDICAL CARE IF:   You have a persistent fever over 102 F (38.9 C).  You develop severe vomiting and are unable to keep the medication down.  You develop severe back or abdominal pain despite taking your medications.  You begin passing a large amount of blood or clots in your urine.  You feel extremely weak or faint, or pass out. MAKE SURE YOU:   Understand these instructions.  Will watch your condition.  Will get help right away if you are not doing well or get worse. Document Released: 10/09/2005 Document Revised: 01/01/2012 Document Reviewed: 05/28/2008 [employer] Patient Information 2014 [employer].  

## 2013-07-02 ENCOUNTER — Other Ambulatory Visit: Payer: Self-pay

## 2013-07-02 LAB — WET PREP BY MOLECULAR PROBE
Gardnerella vaginalis: NEGATIVE
Trichomonas vaginosis: NEGATIVE

## 2013-07-02 MED ORDER — FLUCONAZOLE 150 MG PO TABS: ORAL_TABLET | ORAL | Status: AC

## 2013-08-28 ENCOUNTER — Other Ambulatory Visit: Payer: Self-pay

## 2013-09-29 IMAGING — MG MM SCREEN MAMMOGRAM BILATERAL
5 series · 5 of 5 positions shown · non-contrast
Comparison: Previous exam(s).

CLINICAL DATA: Screening.

DIGITAL SCREENING BILATERAL MAMMOGRAM WITH CAD

[R CC (1 of 2)]
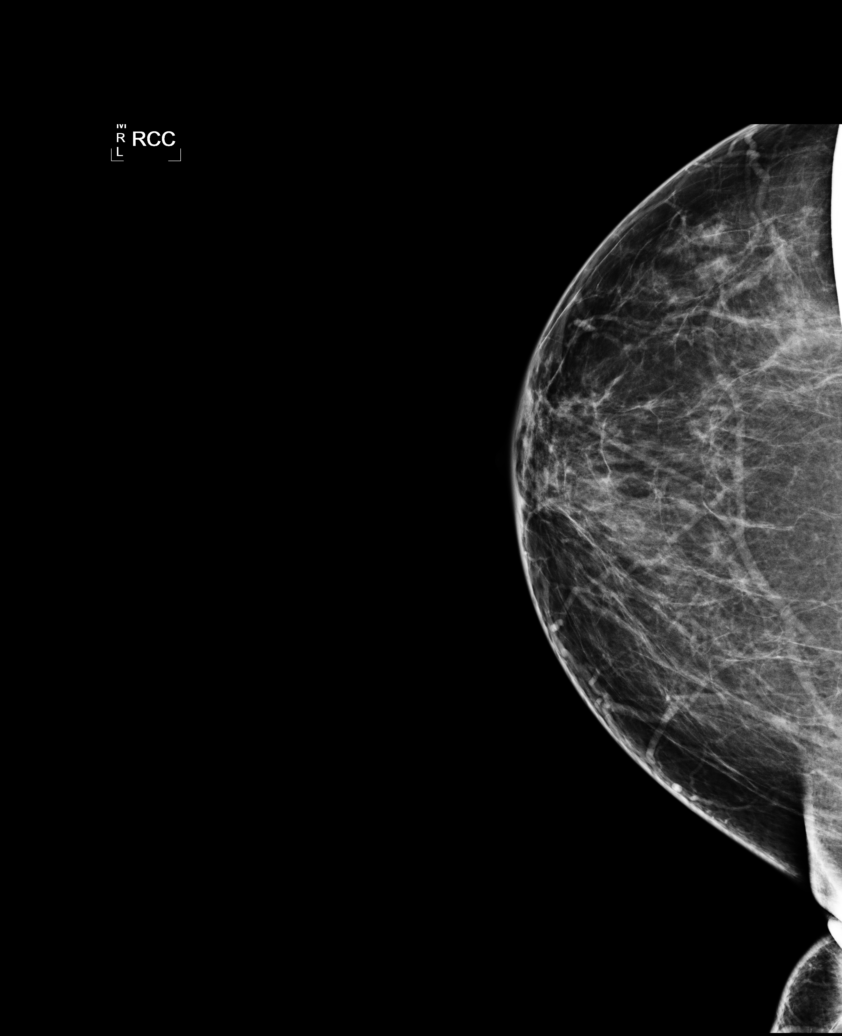

[L CC]
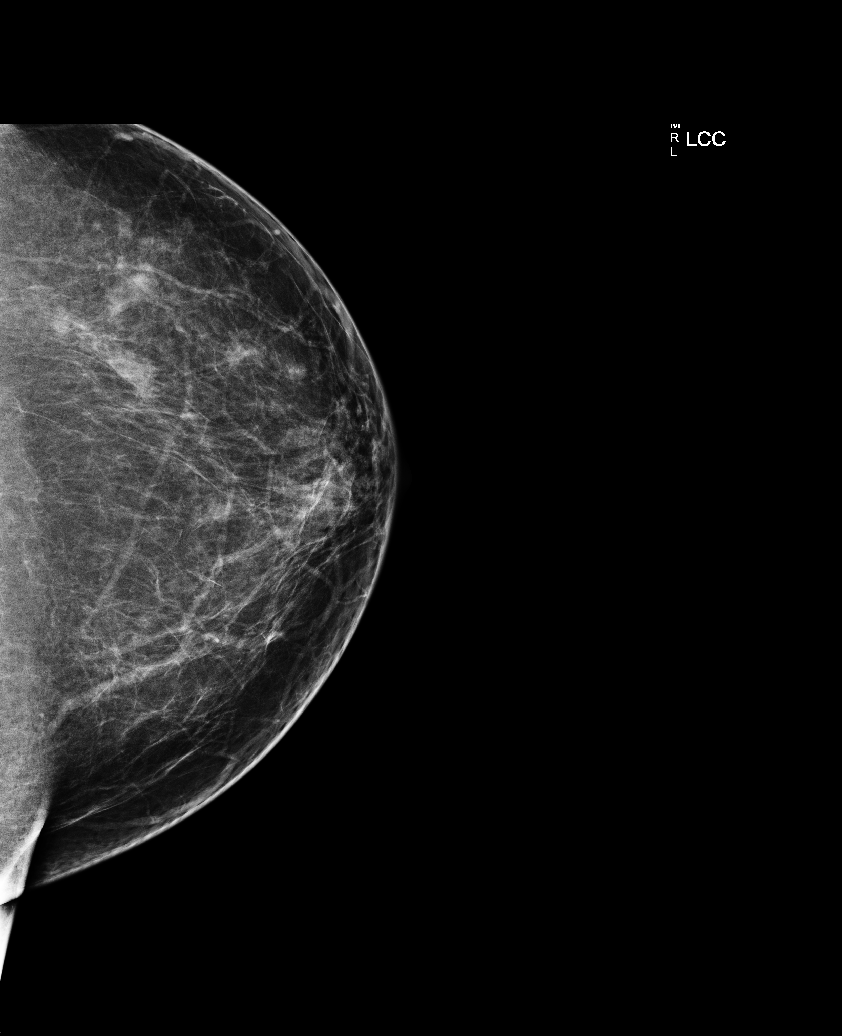

[L MLO]
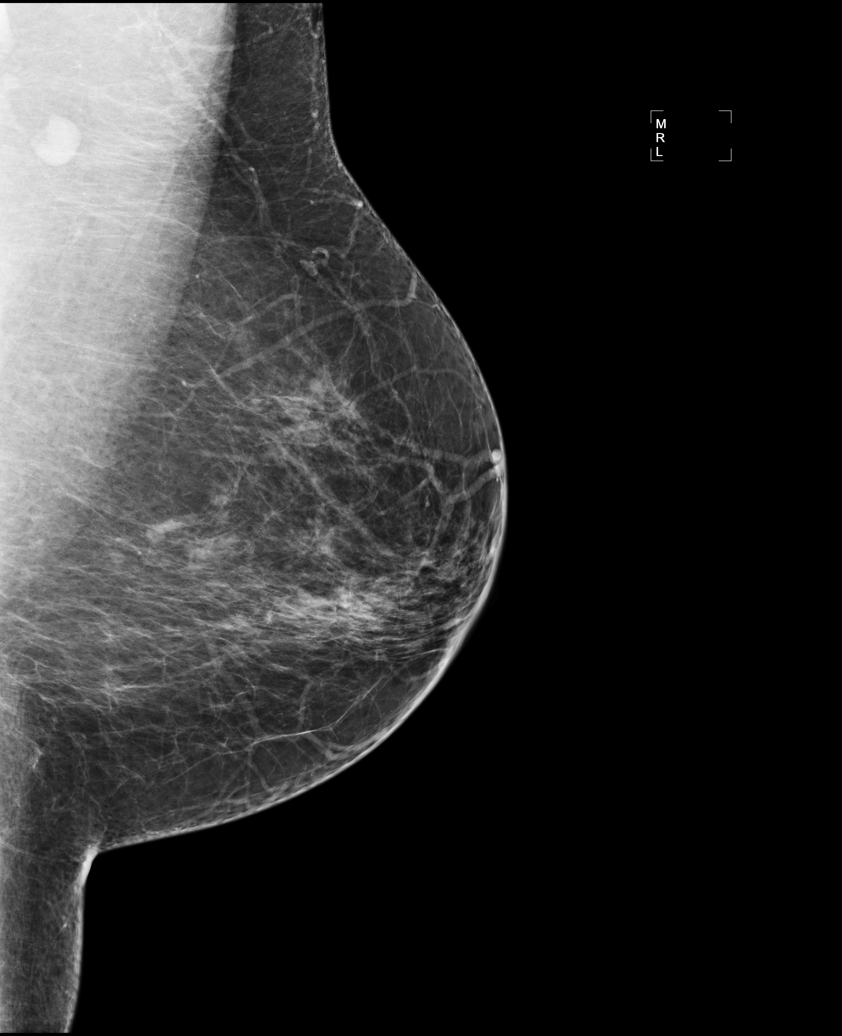

[R MLO]
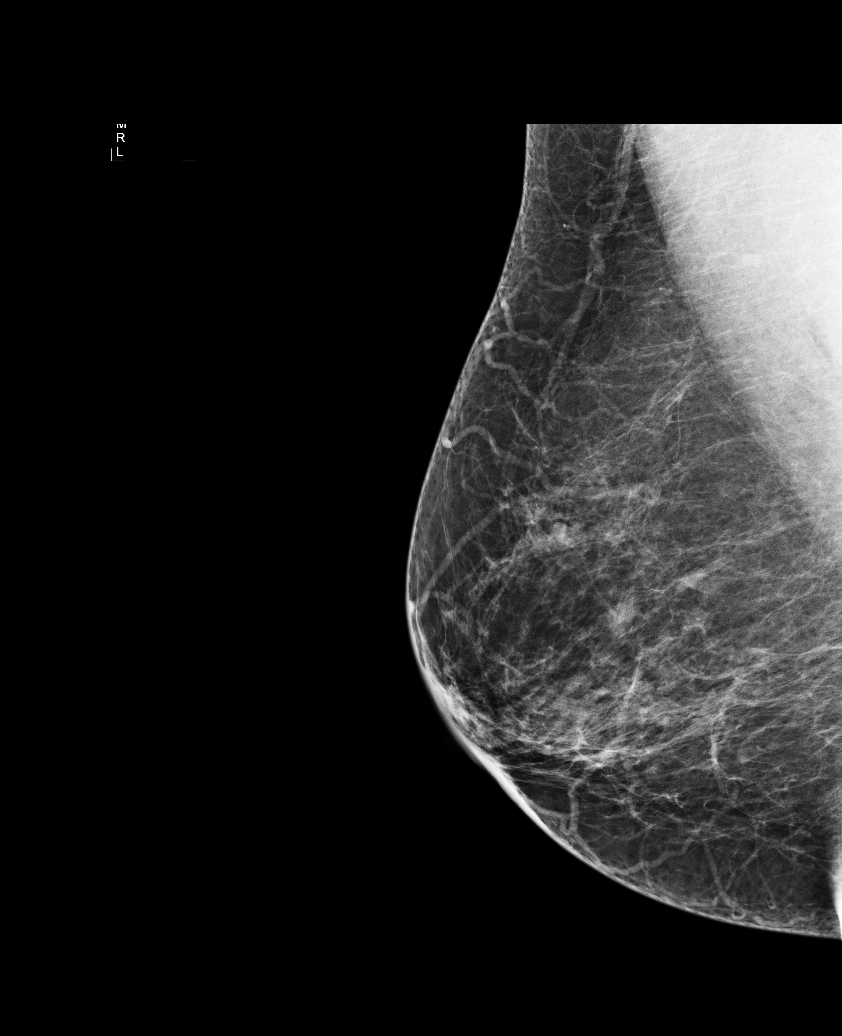

[R CC (2 of 2)]
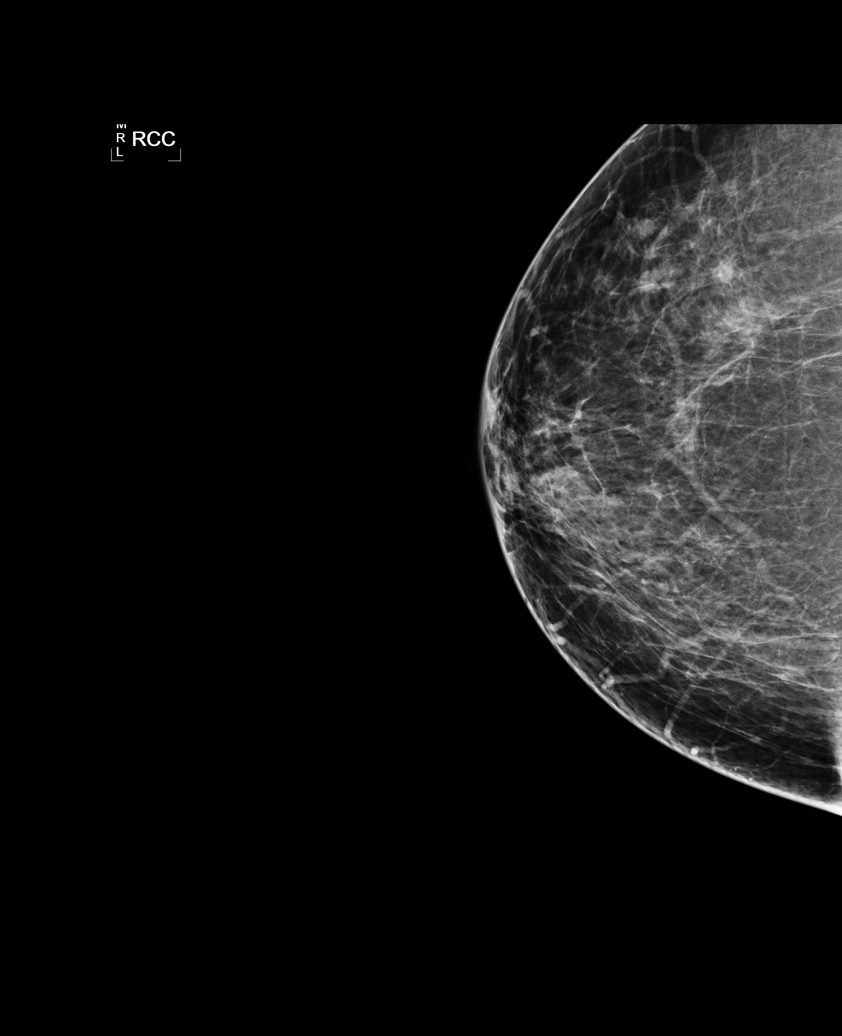

[5 of 5 positions shown; findings below may reference images not displayed]

FINDINGS: ACR Breast Density Category b:  There are scattered areas of
fibroglandular density.

There are no findings suspicious for malignancy.

Images were processed with CAD.
IMPRESSION: No mammographic evidence of malignancy.

A result letter of this screening mammogram will be mailed directly
to the patient.

RECOMMENDATION:
Screening mammogram in one year. (Code:RE-T-HFS)

BI-RADS CATEGORY 1:  Negative.

## 2013-10-20 ENCOUNTER — Other Ambulatory Visit: Payer: Self-pay | Admitting: Family Medicine

## 2013-10-23 DIAGNOSIS — K219 Gastro-esophageal reflux disease without esophagitis: Secondary | ICD-10-CM

## 2013-10-23 HISTORY — DX: Gastro-esophageal reflux disease without esophagitis: K21.9

## 2016-10-23 DIAGNOSIS — S62102A Fracture of unspecified carpal bone, left wrist, initial encounter for closed fracture: Secondary | ICD-10-CM

## 2016-10-23 HISTORY — DX: Fracture of unspecified carpal bone, left wrist, initial encounter for closed fracture: S62.102A

## 2017-06-28 ENCOUNTER — Ambulatory Visit: Payer: Charity | Attending: Physician Assistant

## 2017-06-28 DIAGNOSIS — M25532 Pain in left wrist: Secondary | ICD-10-CM | POA: Insufficient documentation

## 2017-06-28 DIAGNOSIS — R531 Weakness: Secondary | ICD-10-CM | POA: Insufficient documentation

## 2017-06-28 DIAGNOSIS — G8911 Acute pain due to trauma: Secondary | ICD-10-CM | POA: Insufficient documentation

## 2017-06-28 DIAGNOSIS — M25632 Stiffness of left wrist, not elsewhere classified: Secondary | ICD-10-CM | POA: Insufficient documentation

## 2017-06-28 DIAGNOSIS — R29898 Other symptoms and signs involving the musculoskeletal system: Secondary | ICD-10-CM | POA: Insufficient documentation

## 2017-06-28 DIAGNOSIS — M25639 Stiffness of unspecified wrist, not elsewhere classified: Secondary | ICD-10-CM

## 2017-06-28 NOTE — PT Eval Note (Signed)
[facility]  [address], [address]  [place], [place]  [address]  Phone:  [phone]  Fax:  [phone]    PHYSICAL THERAPY EVALUATION AND PLAN OF CARE           Referred By: Anson Fret, PA    *I agree to the plan of care stated below*                                                                                                                                           Physician Signature      Date      PATIENT: Barbara Rubio DOB: 1951/09/07   MR #: [id number]  AGE: 66 y.o.    FACILITY PROVIDER #: [phone] PRIMARY MD: No primary care provider on file.    HICN# Patient is not covered by Medicare. DIAGNOSES: Other fractures of lower end of left radius, subsequent encounter for closed fracture with routine healing [S52.592D]        Date of Service PT Received On: 06/28/17   Treatment Time Start Time: 0915 to Stop Time: 1000   Time Calculation Time Calculation (min): 45 min   Visit # PT Visit  PT Visit Number: 1   Units Billed PT Evaluation  $ PT Evaluation Low Complexity (52841): 1 Procedure       Certification Period:  06/28/2017 to 09/27/2017    Treatment Diagnosis:  Weakness in Left wrist  [R53.1, R 29.898], Decreased Range of Motion in left wrist [M25.639] and Acute left wrist Pain [G89.11, M25.532]    Date of onset: 06/17/2017    Imaging Performed:  X-rays of left wrist: 2 wrist fractures  Closed fracture of distal end of left radius with routine healing  Displaced fracture of left styloid process      Precautions:   HBP / CAD  Orthopedic Injury:  left wrist fracture of ulna and radius    Medications listed in EMR:  currently has no medications in their medication list.  Patient/Caregiver does not report changes to medication at this time.    Allergies:  Allergies not on file    EXAMINATION / EVALUATION:    Past Medical History:  Barbara Rubio  has no past medical history on file.    Past Surgical History:  Barbara Rubio  has no past  surgical history on file.        Social History:  Barbara Rubio reports good social support system at home.Barbara Rubio reports adequate housing and no significant barriers to household mobility.  DME Owned:  None reported.    Patient presents for physical therapy today alone.     HISTORY:       Barbara Rubio is a 66 y.o. female presenting with left wrist pain secondary to left wrist fracture of distal left radius and left ulna styloid process. Pt reports  that she fell off a step and landed on her right wrist. Pt reports that she had imaging at [facility] and was given a temporary cast. Pt was sent to [name] and was instructed to keep temporary cast on until she sees an orthopedic surgeon. Pt saw Barbara Rubio on 8/7 and was put in a cast with left wrist in flexion. Pt went to Barbara Rubio office for weekly X-rays to monitor healing. Pt got the cast off on 8/28th and pt was given a list of exercises. Pt is currently taking Aleve/Advil alternating for pain management.  Pt reports that her pain is currently 2.5/10    Patient Goal: To improve left wrist ROM (pt is left hand dominant    Subjective Report: Pt reports that she has been doing her left wrist ROM exercises daily but she is still unable to do her hair, write, or perform hygiene/dressing activities secondary to left wrist pain and decreased ROM and strength    Pain:    2.5 on a scale of 0 - 10, location: left hand/wrist    Level of function:    Prior level of function Level of function at eval     Independent with ADL's  Independent with IADL's  Working part time.  Peforming light household duties daily.  Volunteers regularly. Required assistance with ADL's: secondary to left wrist pain and weakness  Required asssitance with IADL's: due to left wrist weakness and pain  Restricts activity participation: pt is unable to write with her left hand             REVIEW OF SYSTEMS:  Communication:  Alert and oriented to person, place and  time.  Emotional/Behavioral responses appear appropriate at this time.  Demonstrates ability to make needs known.    Cardiopulmonary:  Seated Vital Signs:  right upper extremity:  Blood Pressure:  139/83  Pulse: 88    Integumentary:  Unremarkable.       Musculoskeletal System:     INITIAL EVAL   PROM/AROM    AROM left wrist ext: 0 (able to bring wrist to neutral  Flexion: 60 degrees  ulanar deviation : 25 degrees  Radial deviation: neutral  Supination: -20 degeres                            INITIAL EVAL  STRENGTH    Wrist flexion/ext/ulnar/radial deviation Not tested secondary to left wrist pain    Left: finger flexion: 3/5  Finger ext: 3/5    Grip Strength: left; 5 lbs  Right; 38 lbs                Palpation:  Tender to palpation along wrist left writ left wrist Medial/lateral joint line    Joint Mobility:  Joint mobility at left wrist difficult to assess secondary to excessive guarding      Neuromuscular System:  Sensation:   Light touch intact throughout.    Reflexes:  not assessed today.    Coordination:  Not assessed today.    INITIAL EVAL    Functional Mobility and transfers:    Patient is independent with household and community mobility.   Balance:   Not assessed today.   Gait:  Unremarkable.  no assistive device               Outcomes:   INITIAL EVAL    QuickDash: 81.8  Patient Education:   Patient was educated on goals and benefits of therapy, as well as importance of performing HEP for left wrist ROM and strength to enable pt to perform functional activities at home and return to work..    Pt. did demonstrate an understanding of this education.  Home Exercise Program was issued today..    Treatment Today:  Evaluation and HEP issued.    EVALUATION AND DIAGNOSIS:   Barbara Rubio is a 66 y.o. female presents to physical therapy with left wrist pain, decreased ROM in all planes and decreased strength. Pt is status post left wrist fracture that is healing well and currently does not have any  restrictions. Pt instructed on importance of performing HEP for left wrist ROM and was given putty to improve grip strength and finger flexion strength    Impairments in body function and structure:  Decreased PROM  Decreased AROM  Decreased Strength   Pain  See above review of systems for details.    Activity Limitations / Restricted Participation:  See above review of systems for details.                PROGNOSIS:  Good for goals.    Without skilled intervention, patient may not be able to:  Perform light household chores safely and independently without left wrist pain  Participate in safe and independent ADL's such as bathing, dressing, grooming with no wrist pain  Participate in safe and independent community ambulation for IADL's such as grocery shopping and preparing meals.  Participate in a full work day safely and independently.  Participate in scholastic and recreational sports acitviites.    Goals:  Short Term Goals:  To be met by 07/28/2017  1. Pt will be able to demonstrate 30 degrees of wrist extension to enable pt to perform personal hygiene activities such as hair and make up  2. Pt will be able to improve pincer grip to enable pt to write with left hand  3. Pt will be able to improve grip strength to 25 lbs to enable pt to perform iADLs such as gorcery shopping and light household chores.  4. Pt will be able to demonstrate 40 degrees of left wrist flexion to enable pt to use the restroom/toileting      Long Term Goals:  To be met by 08/28/2017  5. 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 Pt will be able to demonstrate 20 degrees of ulnar deviation to enable pt to open /close a jar  7. Patient will be independant with HEP for maximal outcomes.      PLAN:  97110 Therapeutic Exercises, to include home exercise program.  97140 Manual Therapy  Modalities: 97035 Ultrasound    Frequency of treatment:    2 times per week  for 8 weeks.    It has been a pleasure to evaluate Perrin Maltese.  Please contact me with any questions or concerns regarding this patient's evaluation or ongoing therapy.     Therapist Signature:    Tylene Fantasia DPT      06/28/2017      CPT Evaluation Code Justification  CRITERIA JUSTIFICATION DESCRIPTION   Personal factors and/or co-morbidities that impact the plan of care. Factors that affect plan of care include:  None noted. None (Low Complexity)     Body Systems Examined Impairments noted in:  Musculoskeletal  Body structures/regions 1-2 elements (Low Complexity)  Clinical Presentation Presentation stable, does not vary. Stable (Low Complexity)      Clinical Decision Making Standardized Assessment used:  See below for details. Evaluation Code:  Low Complexity

## 2017-07-03 ENCOUNTER — Ambulatory Visit: Payer: Charity

## 2017-07-03 DIAGNOSIS — M25532 Pain in left wrist: Secondary | ICD-10-CM

## 2017-07-03 DIAGNOSIS — M25639 Stiffness of unspecified wrist, not elsewhere classified: Secondary | ICD-10-CM

## 2017-07-03 NOTE — Progress Notes (Signed)
[facility]  [address], [address]  [place], [place]  [address]  Phone:  [phone]  Fax:  [phone]    PHYSICAL THERAPY PROGRESS NOTE        PATIENT: Barbara Rubio DOB: 08-23-1951   MR #: [id number]  AGE: 66 y.o.    FACILITY PROVIDER #: [phone] PRIMARY MD: No primary care provider on file.    HICN# Patient is not covered by Medicare. DIAGNOSES: Other fractures of lower end of left radius, subsequent encounter for closed fracture with routine healing [S52.592D]    CERTIFICATION PERIOD:  06/28/2017 to 09/27/2017 TREATMENT DIAGNOSIS:  UE Weakness R53.1, Decreased ROM M25.6 and Joint Pain M25.50       Date of Service PT Received On: 07/03/17   Treatment Time Start Time: 0900 to Stop Time: 1000   Time Calculation Time Calculation (min): 60 min   Visit # PT Visit  PT Visit Number: 2   Units Billed   Therapeutic Interventions  $ PT Therapeutic Exercise (97110): 3 Units  $ PT Manual Therapy (97140): 1 Unit       Certification period, precautions, medications and allergies copied from initial evaluation - reviewed and reconciled today.  Certification Period:  06/28/2017 to 09/27/2017    Treatment Diagnosis:  Weakness in Left wrist  [R53.1, R 29.898], Decreased Range of Motion in left wrist [M25.639] and Acute left wrist Pain [G89.11, M25.532]    Date of onset: 06/17/2017    Imaging Performed:  X-rays of left wrist: 2 wrist fractures  Closed fracture of distal end of left radius with routine healing  Displaced fracture of left styloid process      Precautions:   HBP / CAD  Orthopedic Injury:  left wrist fracture of ulna and radius    Medications listed in EMR:  currently has no medications in their medication list.  Patient/Caregiver does not report changes to medication at this time.    Allergies:  Allergies not on file    EXAMINATION:    Subjective Report:   Pt reports that she has been doing her HEP for left wrist ROM and stretching. Pt reports that she  was able to do her hair today with her left hand and was able to open a jar     Pain:    4 on a scale of 0 - 10, location: left hand/wrist    Objective Findings today:   ROM:  Left Wrist PROM degrees flexion 75 degrees extension 30  AROM: left wrist flexion: 21, ext: 50    Swelling: swelling noted on dorsum of left hand: swelling decreased with ice and elevation at conclusion of session for 10'    Special Tests:  none      Treatment Performed:  MANUAL THERAPY Soft tissue mobilization to left wrist flexors to imporve ROM, Grade 3 joint mobilization to left wrist radiocarpal joint, PROM to wrist in all planes and Manual stretches to left wrist flexors/ext/supinators/finger flexors in order to restore movement in muscles , stretch shortened connective tissue .  THERAPEUTIC EXERCISES  1:1 per exercise flowsheet (see below) in order to promote safe progression of functional activities, increase ROM, increase strength.  Provided instruction in proper form and position to safely perform exercise, instruction in maintaining a gentle stretch (and avoiding pain), instruction to keep ROM pain free .        Exercise Specifics Date  9/11   Date Date Date Date Date   digiflex  Yellow:  indiv fingers and full grip   15x        Wrist flexion/ext  2# 2x10        Wrist pronation/supination  2#  2x10        Manually resisted finger flexion/ext  15x                                                                                                                                          UBE:  5' L 2.0                      Patient Education:   Patient was educated on importance of performing HEP for left wrist ROM to enable pt to erform more functional activities without pain.    Pt. did demonstrate an understanding of this education.  Home Exercise Program was reviewed today.Marland Kitchen    EVALUATION AND DIAGNOSIS:   Deseri Loss is a 66 y.o. female presents to physical therapy today with left wrist pain, decreased ROM, and weakness. Pt was able  to perform all TE without increase in pain. Pt was able to demonstrate an increase in left wrist flex/wxt at the conclusion of session. Pt was instructed to perform left wrist ROM exercises 4x/day to decrease swelling and improve pt's ability to perform more functional activities such as dressing and hygiene    Impairments in body function and structure:  Decreased PROM  Decreased AROM  Decreased Strength   Pain    Activity Limitations / Restricted Participation:  Without skilled intervention, patient may not be able to:  Perform light household chores safely and independently without left wrist pain  Participate in safe and independent ADL's such as bathing, dressing, grooming with no wrist pain  Participate in safe and independent community ambulation for IADL's such as grocery shopping and preparing meals.  Participate in a full work day safely and independently.  Participate in scholastic and recreational sports acitviites            Goals (copied from last note):  Short Term Goals:  To be met by 07/28/2017  1. Pt will be able to demonstrate 30 degrees of wrist extension to enable pt to perform personal hygiene activities such as hair and make up  2. Pt will be able to improve pincer grip to enable pt to write with left hand  3. Pt will be able to improve grip strength to 25 lbs to enable pt to perform iADLs such as gorcery shopping and light household chores.  4. Pt will be able to demonstrate 40 degrees of left wrist flexion to enable pt to use the restroom/toileting      Long Term Goals:  To be met by 08/28/2017  5. 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 Pt will be able  to demonstrate 20 degrees of ulnar deviation to enable pt to open /close a jar  7. Patient will be independant with HEP for maximal outcomes.      GOAL# Progress Toward Goals   1 Progressing   2 Progressing   3 Progressing   4 Progressing                PLAN:   Patient requires continued skilled intervention in order to increase patient safety and independence with daily activities., decrease pain. and meet above mentioned functional goals.  Focus on left wrist ROM and strengthening in next visit.    Therapist Signature:    Tylene Fantasia DPT      07/03/2017

## 2017-07-05 ENCOUNTER — Ambulatory Visit: Payer: Charity

## 2017-07-10 ENCOUNTER — Ambulatory Visit: Payer: Charity

## 2017-07-10 DIAGNOSIS — R29898 Other symptoms and signs involving the musculoskeletal system: Secondary | ICD-10-CM

## 2017-07-10 DIAGNOSIS — M25639 Stiffness of unspecified wrist, not elsewhere classified: Secondary | ICD-10-CM

## 2017-07-12 ENCOUNTER — Ambulatory Visit: Payer: Charity

## 2017-07-12 DIAGNOSIS — M25532 Pain in left wrist: Secondary | ICD-10-CM

## 2017-07-12 DIAGNOSIS — M25639 Stiffness of unspecified wrist, not elsewhere classified: Secondary | ICD-10-CM

## 2017-07-16 NOTE — Progress Notes (Signed)
[facility]  [address], [address]  [place], [place]  [address]  Phone:  [phone]  Fax:  [phone]    PHYSICAL THERAPY PROGRESS NOTE        PATIENT: Barbara Rubio DOB: Apr 15, 1951   MR #: [id number]  AGE: 66 y.o.    FACILITY PROVIDER #: [phone] PRIMARY MD: No primary care provider on file.    HICN# Patient is not covered by Medicare. DIAGNOSES: Other fractures of lower end of left radius, subsequent encounter for closed fracture with routine healing [S52.592D]    CERTIFICATION PERIOD:  06/28/2017 to 09/27/2017 TREATMENT DIAGNOSIS:  UE Weakness R53.1, Decreased ROM M25.6 and Joint Pain M25.50       Date of Service PT Received On: 07/10/17   Treatment Time Start Time: 0900 to Stop Time: 1000   Time Calculation Time Calculation (min): 60 min   Visit #     Units Billed   Therapeutic Interventions  $ PT Therapeutic Exercise (97110): 3 Units  $ PT Manual Therapy (97140): 1 Unit       Certification period, precautions, medications and allergies copied from initial evaluation - reviewed and reconciled today.  Certification Period:  06/28/2017 to 09/27/2017    Treatment Diagnosis:  Weakness in Left wrist  [R53.1, R 29.898], Decreased Range of Motion in left wrist [M25.639] and Acute left wrist Pain [G89.11, M25.532]    Date of onset: 06/17/2017    Imaging Performed:  X-rays of left wrist: 2 wrist fractures  Closed fracture of distal end of left radius with routine healing  Displaced fracture of left styloid process      Precautions:   HBP / CAD  Orthopedic Injury:  left wrist fracture of ulna and radius    Medications listed in EMR:  currently has no medications in their medication list.  Patient/Caregiver does not report changes to medication at this time.    Allergies:  Allergies not on file    EXAMINATION:    Subjective Report:   Pt reports that she was able to use a key to open her house and she has been practicing writing with a bigger pencil      Pain:    3 on a scale of 0 - 10, location: left hand/wrist    Objective Findings today:   None    Swelling: swelling noted on dorsum of left hand: swelling decreased with ice and elevation at conclusion of session for 10'    Special Tests:  none      Treatment Performed:  MANUAL THERAPY Soft tissue mobilization to left wrist flexors to imporve ROM, Grade 3 joint mobilization to left wrist radiocarpal joint, PROM to wrist in all planes and Manual stretches to left wrist flexors/ext/supinators/finger flexors in order to restore movement in muscles , stretch shortened connective tissue .  THERAPEUTIC EXERCISES  1:1 per exercise flowsheet (see below) in order to promote safe progression of functional activities, increase ROM, increase strength.  Provided instruction in proper form and position to safely perform exercise, instruction in maintaining a gentle stretch (and avoiding pain), instruction to keep ROM pain free .        Exercise Specifics Date  9/11   Date  9/18 Date Date Date Date   digiflex  Yellow: indiv fingers and full grip   15x Red: 15x       Wrist flexion/ext  2# 2x10 "       Wrist pronation/supination  2#  2x10 "  Manually resisted finger flexion/ext  15x "       web   3x 30' finger flexion/ext       Grip strengtheining   3x30'                                                                                                                     UBE:  5' L 2.0 5' L 2.0                     Patient Education:   Patient was educated on importance of performing HEP for left wrist ROM to enable pt to erform more functional activities without pain.    Pt. did demonstrate an understanding of this education.  Home Exercise Program was reviewed today.Marland Kitchen    EVALUATION AND DIAGNOSIS:   Malley Hauter is a 66 y.o. female presents to physical therapy today with left wrist pain, decreased ROM, and weakness. Pt was able to perform all TE with moderate fatigue 5/10. Pt was able to perform fingertip to fingertip  exercises without difficulty. Pt continues to demonstrate weakness/difficulty performing left wrist flexion/ext  eccentric exercises.    Impairments in body function and structure:  Decreased PROM  Decreased AROM  Decreased Strength   Pain    Activity Limitations / Restricted Participation:  Without skilled intervention, patient may not be able to:  Perform light household chores safely and independently without left wrist pain  Participate in safe and independent ADL's such as bathing, dressing, grooming with no wrist pain  Participate in safe and independent community ambulation for IADL's such as grocery shopping and preparing meals.  Participate in a full work day safely and independently.  Participate in scholastic and recreational sports acitviites            Goals (copied from last note):  Short Term Goals:  To be met by 07/28/2017  1. Pt will be able to demonstrate 30 degrees of wrist extension to enable pt to perform personal hygiene activities such as hair and make up  2. Pt will be able to improve pincer grip to enable pt to write with left hand  3. Pt will be able to improve grip strength to 25 lbs to enable pt to perform iADLs such as gorcery shopping and light household chores.  4. Pt will be able to demonstrate 40 degrees of left wrist flexion to enable pt to use the restroom/toileting      Long Term Goals:  To be met by 08/28/2017  5. 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 Pt will be able to demonstrate 20 degrees of ulnar deviation to enable pt to open /close a jar  7. Patient will be independant with HEP for maximal outcomes.      GOAL# Progress Toward Goals   1 Progressing   2 Progressing   3 Progressing   4 Progressing  PLAN:   Patient requires continued skilled intervention in order to increase patient safety and independence with daily activities., decrease pain. and meet above mentioned  functional goals.  Focus on left wrist ROM and strengthening in next visit.    Therapist Signature:    Tylene Fantasia DPT      07/10/2017

## 2017-07-16 NOTE — Progress Notes (Addendum)
[facility]  [address]  [place], [place]  [address]  Phone:  [phone]  Fax:  [phone]    PHYSICAL THERAPY PROGRESS NOTE        PATIENT: Barbara Rubio DOB: 1951/02/10   MR #: [id number]  AGE: 66 y.o.    FACILITY PROVIDER #: [phone] PRIMARY MD: No primary care provider on file.    HICN# Patient is not covered by Medicare. DIAGNOSES: Other fractures of lower end of left radius, subsequent encounter for closed fracture with routine healing [S52.592D]    CERTIFICATION PERIOD:  06/28/2017 to 09/27/2017 TREATMENT DIAGNOSIS:  UE Weakness R53.1, Decreased ROM M25.6 and Joint Pain M25.50       Date of Service PT Received On: 07/12/17   Treatment Time Start Time: 0900 to Stop Time: 1000   Time Calculation Time Calculation (min): 60 min   Visit # PT Visit  PT Visit Number: 4   Units Billed   Therapeutic Interventions  $ PT Therapeutic Exercise (97110): 3 Units  $ PT Manual Therapy (97140): 1 Unit       Certification period, precautions, medications and allergies copied from initial evaluation - reviewed and reconciled today.  Certification Period:  06/28/2017 to 09/27/2017    Treatment Diagnosis:  Weakness in Left wrist  [R53.1, R 29.898], Decreased Range of Motion in left wrist [M25.639] and Acute left wrist Pain [G89.11, M25.532]    Date of onset: 06/17/2017    Imaging Performed:  X-rays of left wrist: 2 wrist fractures  Closed fracture of distal end of left radius with routine healing  Displaced fracture of left styloid process      Precautions:   HBP / CAD  Orthopedic Injury:  left wrist fracture of ulna and radius    Medications listed in EMR:  currently has no medications in their medication list.  Patient/Caregiver does not report changes to medication at this time.    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  THERAPEUTIC EXERCISES  1:1 per exercise flowsheet (see below) in order to promote safe progression of functional activities, increase ROM, increase strength.  Provided instruction in proper form and position to safely perform exercise, instruction in maintaining a gentle stretch (and avoiding pain), instruction to keep ROM pain free .        Exercise Specifics Date  9/11   Date  9/18 Date  9/20 Date Date Date   digiflex  Yellow: indiv fingers and full grip   15x Red: 15x "      Wrist flexion/ext  2# 2x10 " "      Wrist pronation/supination  2#  2x10 " "      Manually resisted finger flexion/ext  15x " "      web   3x 30' finger flexion/ext "      Grip strengtheining   3x30' "      Introduced eccentric band exercises    RTB 15x wrist flexion/ext/pron/sup/ulnar/radial deviation                                                                                                          UBE:  5' L 2.0 5' L 2.0 "                    Patient Education:   Patient was educated on importance of performing HEP for left wrist ROM to enable pt to erform more functional activities without pain.    Pt. did demonstrate an understanding of this education.  Home Exercise Program was reviewed today.Marland Kitchen    EVALUATION AND DIAGNOSIS:   Barbara Rubio is a 66 y.o. female presents to physical therapy today with left wrist pain, decreased ROM, and weakness. Pt  continues to progress in left wrist ROM and strength and is able to perform more fine motor activities: holding a pencil and write her name, pickup marbles, and sorting throw a jar for object recognition.     Impairments in body function and structure:  Decreased PROM  Decreased AROM  Decreased Strength   Pain    Activity Limitations / Restricted Participation:  Without skilled intervention, patient may not be able to:  Perform light household chores safely and independently without left wrist pain  Participate in safe and independent ADL's such as bathing, dressing, grooming with no wrist pain  Participate in safe and independent community ambulation for IADL's such as grocery shopping and preparing meals.  Participate in a full work day safely and independently.  Participate in scholastic and recreational sports acitviites            Goals (copied from last note):  Short Term Goals:  To be met by 07/28/2017  1. Pt will be able to demonstrate 30 degrees of wrist extension to enable pt to perform personal hygiene activities such as hair and make up  2. Pt will be able to improve pincer grip to enable pt to write with left hand  3. Pt will be able to improve grip strength to 25 lbs to enable pt to perform iADLs such as gorcery shopping and light household chores.  4. Pt will be able to demonstrate 40 degrees of left wrist flexion to enable pt to use the restroom/toileting      Long Term Goals:  To be met by 08/28/2017  5. 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 Pt will be able to demonstrate 20 degrees of ulnar deviation to enable pt to open /close a jar  7. Patient will be independant with HEP for maximal outcomes.      GOAL# Progress Toward Goals  1 MET: see above objective measures   2 Progressing   3 Progressing   4 MET               PLAN:   Patient requires continued skilled intervention in order to increase patient safety  and independence with daily activities., decrease pain. and meet above mentioned functional goals.  Focus on left wrist ROM and strengthening in next visit.    Therapist Signature:    Tylene Fantasia DPT      07/12/2017

## 2017-07-17 ENCOUNTER — Ambulatory Visit: Payer: Charity

## 2017-07-17 DIAGNOSIS — M25532 Pain in left wrist: Secondary | ICD-10-CM

## 2017-07-17 DIAGNOSIS — M25632 Stiffness of left wrist, not elsewhere classified: Secondary | ICD-10-CM

## 2017-07-19 ENCOUNTER — Ambulatory Visit: Payer: Charity

## 2017-07-19 DIAGNOSIS — M25532 Pain in left wrist: Secondary | ICD-10-CM

## 2017-07-19 DIAGNOSIS — M25632 Stiffness of left wrist, not elsewhere classified: Secondary | ICD-10-CM

## 2017-07-19 NOTE — Progress Notes (Signed)
[facility]  [address], [address]  [place], [place]  [address]  Phone:  [phone]  Fax:  [phone]    PHYSICAL THERAPY PROGRESS NOTE        PATIENT: Barbara Rubio DOB: [birth year]   MR #: [id number]  AGE: 66 y.o.    FACILITY PROVIDER #: [phone] PRIMARY MD: No primary care provider on file.    HICN# Patient is not covered by Medicare. DIAGNOSES: Other fractures of lower end of left radius, subsequent encounter for closed fracture with routine healing [S52.592D]    CERTIFICATION PERIOD:  06/28/2017 to 09/27/2017 TREATMENT DIAGNOSIS:  UE Weakness R53.1, Decreased ROM M25.6 and Joint Pain M25.50       Date of Service PT Received On: 07/17/17   Treatment Time Start Time: 0900 to Stop Time: 1000   Time Calculation Time Calculation (min): 60 min   Visit # PT Visit  PT Visit Number: 5   Units Billed   Therapeutic Interventions  $ PT Therapeutic Exercise (97110): 3 Units  $ PT Manual Therapy (97140): 1 Unit       Certification period, precautions, medications and allergies copied from initial evaluation - reviewed and reconciled today.  Certification Period:  06/28/2017 to 09/27/2017    Treatment Diagnosis:  Weakness in Left wrist  [R53.1, R 29.898], Decreased Range of Motion in left wrist [M25.639] and Acute left wrist Pain [G89.11, M25.532]    Date of onset: 06/17/2017    Imaging Performed:  X-rays of left wrist: 2 wrist fractures  Closed fracture of distal end of left radius with routine healing  Displaced fracture of left styloid process      Precautions:   HBP / CAD  Orthopedic Injury:  left wrist fracture of ulna and radius    Medications listed in EMR:  currently has no medications in their medication list.  Patient/Caregiver does not report changes to medication at this time.    Allergies:  Allergies not on file    EXAMINATION:    Subjective Report:   Pt reports that she continues to work on her fine motor skills sheet and that her wrist is stiff  in the AM but will loosen up as the day goes on and she uses her grip.     Pain:    4 on a scale of 0 - 10, location: left hand/wrist    Objective Findings today:   Before session:   None        Special Tests:  none      Treatment Performed:  MANUAL THERAPY Soft tissue mobilization to left wrist flexors to imporve ROM, Grade 3 joint mobilization to left wrist radiocarpal joint, PROM to wrist in all planes and Manual stretches to left wrist flexors/ext/supinators/finger flexors in order to restore movement in muscles , stretch shortened connective tissue .  THERAPEUTIC EXERCISES  1:1 per exercise flowsheet (see below) in order to promote safe progression of functional activities, increase ROM, increase strength.  Provided instruction in proper form and position to safely perform exercise, instruction in maintaining a gentle stretch (and avoiding pain), instruction to keep ROM pain free .        Exercise Specifics Date  9/11   Date  9/18 Date  9/20 Date  9/25 Date Date   digiflex  Yellow: indiv fingers and full grip   15x Red: 15x " Red  20x     Wrist flexion/ext  2# 2x10 " " AROM with overpressure  at end range;20x     Wrist pronation/supination  2#  2x10 " " 3# 2x10     Manually resisted finger flexion/ext  15x " " "     web   3x 30' finger flexion/ext "      Grip strengtheining   3x30' " Red 3x30'     Introduced eccentric band exercises    RTB 15x wrist flexion/ext/pron/sup/ulnar/radial deviation "     Wrist ext into web     2x20x                                                                                               UBE:  5' L 2.0 5' L 2.0 "                    Patient Education:   Patient was educated on importance of performing HEP for left wrist ROM to enable pt to erform more functional activities without pain.    Pt. did demonstrate an understanding of this education.  Home Exercise Program was reviewed today.Marland Kitchen    EVALUATION AND DIAGNOSIS:   Barbara Rubio is a 66 y.o. female presents to physical  therapy today with left wrist pain, decreased ROM, and weakness. Pt continues to progress in left wrist ROM and strength after manual therapy with radial carpal mobs to increase left wrist flexion/ext.     Impairments in body function and structure:  Decreased PROM  Decreased AROM  Decreased Strength   Pain    Activity Limitations / Restricted Participation:  Without skilled intervention, patient may not be able to:  Perform light household chores safely and independently without left wrist pain  Participate in safe and independent ADL's such as bathing, dressing, grooming with no wrist pain  Participate in safe and independent community ambulation for IADL's such as grocery shopping and preparing meals.  Participate in a full work day safely and independently.  Participate in scholastic and recreational sports acitviites            Goals (copied from last note):  Short Term Goals:  To be met by 07/28/2017  1. Pt will be able to demonstrate 30 degrees of wrist extension to enable pt to perform personal hygiene activities such as hair and make up  2. Pt will be able to improve pincer grip to enable pt to write with left hand  3. Pt will be able to improve grip strength to 25 lbs to enable pt to perform iADLs such as gorcery shopping and light household chores.  4. Pt will be able to demonstrate 40 degrees of left wrist flexion to enable pt to use the restroom/toileting      Long Term Goals:  To be met by 08/28/2017  5. 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 Pt will be able to demonstrate 20 degrees of ulnar deviation to enable pt to open /close a jar  7. Patient will be independant with HEP for maximal outcomes.      GOAL# Progress  Toward Goals   1 MET: see above objective measures   2 Progressing   3 Progressing   4 MET               PLAN:   Patient requires continued skilled intervention in order to increase patient safety and  independence with daily activities., decrease pain. and meet above mentioned functional goals.  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

## 2017-07-24 ENCOUNTER — Ambulatory Visit: Payer: Charity

## 2017-07-26 ENCOUNTER — Ambulatory Visit: Payer: Charity

## 2017-07-26 NOTE — Progress Notes (Signed)
[facility]  [address]  [place], [place]  [address]  Phone:  [phone]  Fax:  [phone]    PHYSICAL THERAPY PROGRESS NOTE/Discharge summary        PATIENT: Barbara Rubio DOB: 1950/12/23   MR #: [id number]  AGE: 66 y.o.    FACILITY PROVIDER #: [phone] PRIMARY MD: No primary care provider on file.    HICN# Patient is not covered by Medicare. DIAGNOSES: Other fractures of lower end of left radius, subsequent encounter for closed fracture with routine healing [S52.592D]    CERTIFICATION PERIOD:  06/28/2017 to 09/27/2017 TREATMENT DIAGNOSIS:  UE Weakness R53.1, Decreased ROM M25.6 and Joint Pain M25.50       Date of Service PT Received On: 07/19/17   Treatment Time Start Time: 0900 to Stop Time: 1000   Time Calculation Time Calculation (min): 60 min   Visit # PT Visit  PT Visit Number: 6   Units Billed   Therapeutic Interventions  $ PT Therapeutic Exercise (97110): 3 Units  $ PT Manual Therapy (97140): 1 Unit       Certification period, precautions, medications and allergies copied from initial evaluation - reviewed and reconciled today.  Certification Period:  06/28/2017 to 09/27/2017    Treatment Diagnosis:  Weakness in Left wrist  [R53.1, R 29.898], Decreased Range of Motion in left wrist [M25.639] and Acute left wrist Pain [G89.11, M25.532]    Date of onset: 06/17/2017    Imaging Performed:  X-rays of left wrist: 2 wrist fractures  Closed fracture of distal end of left radius with routine healing  Displaced fracture of left styloid process      Precautions:   HBP / CAD  Orthopedic Injury:  left wrist fracture of ulna and radius    Medications listed in EMR:  currently has no medications in their medication list.  Patient/Caregiver does not report changes to medication at this time.    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   Pt has been practicing writing with a gripper on her pencil     Pain:    4 on a scale of 0 - 10, location: left hand/wrist with PROM and manual stretching of wrist flexors/ext/adn supinaters      Objective Findings today:   Musculoskeletal System:     INITIAL EVAL   PROM/AROM AT DISCHARGE   AROM left wrist ext: 0 (able to bring wrist to neutral  Flexion: 60 degrees  ulanar deviation : 25 degrees  Radial deviation: neutral  Supination: -20 degeres   AROM left wrist ext:28, flexion 76  Ulnar deviation:32   Radial: 6  Supination: 58        PROM left wrist flexion: 82, ext 68                     INITIAL EVAL  STRENGTH DISCHARGE   Wrist flexion/ext/ulnar/radial deviation Not tested secondary to left wrist pain    Left: finger flexion: 3/5  Finger ext: 3/5 Finger flexion/ext and wrist flex/ext: 4/5 limited by pain   Grip Strength: left; 5 lbs  Right; 38 lbs Grip strength not tested secondary to equipment getting repaired                     Special Tests:  Decreased swelling noted  on dorsum of hand      Treatment Performed:  MANUAL THERAPY Soft tissue mobilization to left wrist flexors to imporve ROM, Grade 3 joint mobilization to left wrist radiocarpal joint, PROM to wrist in all planes and Manual stretches to left wrist flexors/ext/supinators/finger flexors in order to restore movement in muscles , stretch shortened connective tissue .  THERAPEUTIC EXERCISES  1:1 per exercise flowsheet (see below) in order to promote safe progression of functional activities, increase ROM, increase strength.  Provided instruction in proper form and position to safely perform exercise, instruction in maintaining a gentle stretch (and avoiding pain), instruction to keep ROM pain free .        Exercise Specifics Date  9/11   Date  9/18 Date  9/20 Date  9/25 Date  9/27 Date   digiflex  Yellow: indiv fingers and full grip   15x Red: 15x " Red  20x "    Wrist flexion/ext  2# 2x10 " " AROM with overpressure at end range;20x  "    Wrist pronation/supination  2#  2x10 " " 3# 2x10 "    Manually resisted finger flexion/ext  15x " " " "    web   3x 30' finger flexion/ext "  2x10    Grip strengtheining   3x30' " Red 3x30' "    Introduced eccentric band exercises    RTB 15x wrist flexion/ext/pron/sup/ulnar/radial deviation " 2x10    Wrist ext into web     2x20x "                                                                                              UBE:  5' L 2.0 5' L 2.0 "  8' L 2.5                  Patient Education:   Patient was given extensive HEP for left wrist ROM and strengthening s well as fine motor activities to enable pt to perform all functional activities and return to work as a Lawyer  Home Exercise Program was reviewed today.Marland Kitchen    EVALUATION AND DIAGNOSIS:   Barbara Rubio is a 66 y.o. female presents to physical therapy today with left wrist pain, decreased ROM, and weakness. Pt has demonstrated significant improvements in left wrist ROM as noted above. Pt continues to come to PT with tightness and swelling of the left wrist/hand that is lessened with ice and elevation. Pt is able to write with a finger grip to increase pencil diameter but encouraged to continue practicing her writing without the grip as well. Pt continues to have limited AROM and strength of left wrist and fingers and will contact therapist if she is able to continue with therapy once her assignment as a substitute teacher is finished.     Impairments in body function and structure:  Decreased PROM  Decreased AROM  Decreased Strength   Pain    Activity Limitations / Restricted Participation:  Without skilled intervention, patient may not be able to:    Participate in safe and independent ADL's such as bathing, dressing, grooming with  no wrist pain: Pt is able to do her hair and makeup  Participate in safe and independent community ambulation for IADL's such as grocery shopping and preparing meals. Pt is able to cook and is using lighter  pots and pans. Pt is assisting with her right hand when carrying larger pots  Participate in a full work day safely and independently. Pt is to start substitute teaching tomorrow  Participate in scholastic and recreational sports acitviites            Goals (copied from last note):  Short Term Goals:  To be met by 07/28/2017  1. Pt will be able to demonstrate 30 degrees of wrist extension to enable pt to perform personal hygiene activities such as hair and make up  2. Pt will be able to improve pincer grip to enable pt to write with left hand  3. Pt will be able to improve grip strength to 25 lbs to enable pt to perform iADLs such as gorcery shopping and light household chores.  4. Pt will be able to demonstrate 40 degrees of left wrist flexion to enable pt to use the restroom/toileting      Long Term Goals:  To be met by 08/28/2017  5. 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 Pt will be able to demonstrate 20 degrees of ulnar deviation to enable pt to open /close a jar  7. Patient will be independant with HEP for maximal outcomes.      GOAL# Progress Toward Goals   1 MET: see above objective measures   2 MET: Pt is able to write legibly with a grip on the pencil to increase diameter   3 Progressing: unable to test since equipment is being serviced   4 MET   5 MET: see above for details   6 MET       PLAN:   Patient requires continued skilled intervention in order to increase patient safety and independence with daily activities., decrease pain. and meet above mentioned functional goals.  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

## 2017-07-31 ENCOUNTER — Ambulatory Visit: Payer: Charity

## 2017-08-02 ENCOUNTER — Ambulatory Visit: Payer: Charity

## 2017-08-07 ENCOUNTER — Ambulatory Visit: Payer: Charity

## 2017-08-09 ENCOUNTER — Ambulatory Visit: Payer: Charity

## 2017-08-14 ENCOUNTER — Ambulatory Visit: Payer: Charity

## 2017-08-16 ENCOUNTER — Ambulatory Visit: Payer: Charity

## 2017-08-21 ENCOUNTER — Ambulatory Visit: Payer: Charity

## 2017-08-23 ENCOUNTER — Ambulatory Visit: Payer: Charity

## 2017-10-08 ENCOUNTER — Encounter (INDEPENDENT_AMBULATORY_CARE_PROVIDER_SITE_OTHER): Payer: Self-pay | Admitting: Internal Medicine

## 2017-10-08 ENCOUNTER — Ambulatory Visit (FREE_STANDING_LABORATORY_FACILITY): Payer: Medicare Other | Admitting: Internal Medicine

## 2017-10-08 VITALS — BP 118/74 | HR 72 | Temp 98.2°F | Ht 67.0 in | Wt 243.0 lb

## 2017-10-08 DIAGNOSIS — E782 Mixed hyperlipidemia: Secondary | ICD-10-CM | POA: Insufficient documentation

## 2017-10-08 DIAGNOSIS — Z0000 Encounter for general adult medical examination without abnormal findings: Secondary | ICD-10-CM

## 2017-10-08 DIAGNOSIS — E7800 Pure hypercholesterolemia, unspecified: Secondary | ICD-10-CM

## 2017-10-08 DIAGNOSIS — N393 Stress incontinence (female) (male): Secondary | ICD-10-CM

## 2017-10-08 DIAGNOSIS — Z23 Encounter for immunization: Secondary | ICD-10-CM

## 2017-10-08 DIAGNOSIS — I10 Essential (primary) hypertension: Secondary | ICD-10-CM | POA: Insufficient documentation

## 2017-10-08 DIAGNOSIS — M2550 Pain in unspecified joint: Secondary | ICD-10-CM

## 2017-10-08 MED ORDER — LISINOPRIL-HYDROCHLOROTHIAZIDE 20-25 MG PO TABS
1.00 | ORAL_TABLET | Freq: Every day | ORAL | 1 refills | Status: DC
Start: 2017-10-08 — End: 2018-07-30

## 2017-10-08 NOTE — Patient Instructions (Signed)
Women's Preventive Wellness Plan  Today's Date: October 08, 2017    Patient Name:Abelina Coffin    Date of Birth: [birth year]     As part of your wellness benefit, Medicare makes many screening tests available to you at no charge.  A complete list of these tests can be found at their website, [employer]. However, many of these tests or recommendations are out of date, or may not apply to you. After careful consideration of your own personal health needs, the following testing is recommended for you:      Preventive Service    Up-to-date (UTD)/Due/Not Applicable (N/A)   Last Done   Medicare Frequency   Body Mass Index   Up-to-date October 08, 2017  (BMI):Body mass index is 38.06 kg/m.   Height:Height: 170.2 cm (5\' 7" )  Weight:Weight: 110.2 kg (243 lb)  Annually   Blood Pressure: Up-to-date October 08, 2017    BP: 118/74    Every 2 yrs, if BP </= 120/80 mm hg   Annually, if BP >120-139/80-89 mm hg   Cholesterol Testing Due No results found for: LDL   Regularly beginning at age 65 with risk factors   Diabetes Screening Due No results found for: GLU    If prediabetes, one screening every 6 months   Otherwise, one screening every 12 months with certain risk factors for diabetes   Osteoporosis Screening   (Bone Density Measurement)  Due   Routinely, for women aged 65+   Routinely, for women aged 9-64 with risk factors   Breast Cancer Screening   (Mammogram) Due   Every 2 yrs, aged 8-65 yrs   Cervical Cancer Screening   (Pap Smear)  Not medically indicated   Annually if at high risk for developing cervical or vaginal cancer or childbearing age with abnormal Pap test within past 3 years; Every 2 years for women at normal risk   HPV: Once every 5 years; All asymptomatic female Medicare beneficiaries aged 65 to 65 years   Colorectal Cancer Screening Up-to-date   Annually, Fecal Occult Blood Stool (FOBS)   Every 5 yrs, Sigmoidoscopy with  FOBS   Every 10 yrs, Colonoscopy   Every 3 yrs, Cologuard   Depression Screening Up-to-date October 08, 2017   As necessary for those with risk factors   Sexually Transmitted Diseases (STDs) & HIV Screening Not medically indicated   As necessary for those with risk factors   Alcohol Misuse Screening Up-to-date   As necessary for those with risk factors   Immunizations:   Prevnar is due   Pt will return   There is no immunization history on file for this patient.  Prevnar 13: 1 dose after age 65   Pneumovax 32: 1 dose 1 year after Prevnar   Influenza: Annually   Advance Directive 5 wishes form given to patient   Once; update as needed   Medical Nutrition Therapy Not medically indicated   As necessary for diabetes or renal disease   Smoking Cessation Counseling Not applicable Counseling given: Not Answered    Frequency: two cessation attempts per year.   Glaucoma Screening Due  . Annually for covered high risk Medicare beneficiaries (one of the following: DM, FHx Glaucoma, African-Americans aged 33+, Hispanic-Americans aged 65)   Hepatitis C Virus (HCV) Screening Due   Annually only for high risk behavior   Once if born between [birth year] and 1964 and are not considered high risk   Lung Cancer Screening Not applicable   Annually if asymptomatic, tobacco smoking  history of at least 30 pack-years (one pack-year = smoking one pack per day for one year; 1 pack = 20 cigarettes), and current smoker or one who has quit smoking within the last 15 years     Your major risk factors:       Hypertension     Recommendations for improvement:    Low cholesterol diet and Exercise     Referrals:    See After Visit Summary orders         Beacher May, MD   10/08/2017

## 2017-10-08 NOTE — Progress Notes (Signed)
Barbara Rubio is a 66 y.o. female who presents today for a Medicare Annual Wellness Visit.     Health Risk Assessment     During the past month, how would you rate your general health?:     Which of the following tasks can you do without assistance - drive or take the bus alone; shop for groceries or clothes; prepare your own meals; do your own housework/laundry; handle your own finances/pay bills; eat, bathe or get around your home?:  Eat, bathe, dress or get around your home, Shop for groceries or clothes, Prepare your own meals, Do your own housework/laundry, Handle your own finances/pay bills, Drive or take the bus alone  Which of the following problems have you been bothered by in the past month - dizzy when standing up; problems using the phone; feeling tired or fatigued; moderate or severe body pain?: None of these  Do you exercise for about 20 minutes 3 or more days per week?:  Yes  During the past month was someone available to help if you needed and wanted help?  For example, if you felt nervous, lonely, got sick and had to stay in bed, needed someone to talk to, needed help with daily chores or needed help just taking care of yourself.: Yes  Do you always wear a seat belt?: Yes  Do you have any trouble taking medications the way you have been told to take them?: No  Have you been given any information that can help you with keeping track of your medications?: Yes  Do you have trouble paying for your medications?: No  Have you been given any information that can help you with hazards in your house, such as scatter rugs, furniture, etc?: Yes  Do you feel unsteady when standing or walking?: No  Do you worry about falling?: No  Have you fallen two or more times in the past year?: Yes  Did you suffer any injuries from your falls in the past year?: Yes    Pt works as a Social worker Concerns    Patient Care Team:  Beacher May, MD as PCP - General (Internal Medicine)    Past  Medical History:   Diagnosis Date   . Hyperlipidemia    . Hypertension    . Left wrist fracture 2018     Past Surgical History:   Procedure Laterality Date   . HYSTERECTOMY       partial     No Known Allergies   Current Outpatient Prescriptions   Medication Sig Dispense Refill   . lisinopril-hydroCHLOROthiazide (PRINZIDE,ZESTORETIC) 20-25 MG per tablet Take 1 tablet by mouth daily. 90 tablet 1     No current facility-administered medications for this visit.       Social History   Substance Use Topics   . Smoking status: Never Smoker   . Smokeless tobacco: Former Neurosurgeon   . Alcohol use 0.6 oz/week     1 Glasses of wine per week      Comment:  once a month       Family History   Problem Relation Age of Onset   . Hypertension Mother    . Hypertension Father    . Hypertension Sister    . Hypertension Brother         The following sections were reviewed this encounter by the provider:   Tobacco  Allergies  Meds  Problems  Med Hx  Surg Hx  Fam  Hx  Soc Hx          Hospitalizations  no hospitalizations within past 6 months    Depression Screening    See related Activity or Flowsheet    Functional Ability    Falls Risk:  home has throw rugs, poor lighting, or slippery bath tub/shower  Hearing:  hearing within normal limits  Exercise:  walking  ADL's:   Bathing - independent   Dressing - independent   Mobility - independent   Transfer - independent   Eating - independent}   Toileting - independent   ADL assistance not needed    Discussion of Advance Directives: Has an Advanced Directive. A copy has not been provided. Requested to provide.     Assessment    BP 118/74 (BP Site: Right arm, Patient Position: Sitting, Cuff Size: Large)   Pulse 72   Temp 98.2 F (36.8 C) (Oral)   Ht 1.702 m (5\' 7" )   Wt 110.2 kg (243 lb)   SpO2 98%   BMI 38.06 kg/m        Screening EKG (IPPE only): normal EKG, normal sinus rhythm        Evaluation of Cognitive Function    Mood/affect: Appropriate  Appearance: neatly groomed,  appropriately and adequately nourished  Family member/caregiver input: Not present        AWV Mini-Cog Result:  3 recalled words - negative screen for dementia    Assessment/Plan    1. Routine physical examination  - Hemoglobin A1C  - Mammo Digital Screening Bilateral W Cad; Future  - Dxa Bone Density Axial Skeleton; Future  - Hepatitis C Antibody  - ECG 12 lead    Pt left before getting flu shot, will return for shot         Women's Preventive Wellness Plan  Today's Date: October 08, 2017    Patient Name:Barbara Rubio    Date of Birth: 1951-03-04     As part of your wellness benefit, Medicare makes many screening tests available to you at no charge.  A complete list of these tests can be found at their website, [email or web address]. However, many of these tests or recommendations are out of date, or may not apply to you. After careful consideration of your own personal health needs, the following testing is recommended for you:      Preventive Service    Up-to-date (UTD)/Due/Not Applicable (N/A)   Last Done   Medicare Frequency   Body Mass Index   Up-to-date October 08, 2017  (BMI):Body mass index is 38.06 kg/m.   Height:Height: 170.2 cm (5\' 7" )  Weight:Weight: 110.2 kg (243 lb)  Annually   Blood Pressure: Up-to-date October 08, 2017    BP: 118/74    Every 2 yrs, if BP </= 120/80 mm hg   Annually, if BP >120-139/80-89 mm hg   Cholesterol Testing Due No results found for: LDL   Regularly beginning at age 64 with risk factors   Diabetes Screening Due No results found for: GLU    If prediabetes, one screening every 6 months   Otherwise, one screening every 12 months with certain risk factors for diabetes   Osteoporosis Screening   (Bone Density Measurement)  Due   Routinely, for women aged 65+   Routinely, for women aged 10-64 with risk factors   Breast Cancer Screening   (Mammogram) Due   Every 2 yrs, aged 49-74 yrs   Cervical Cancer Screening    (Pap Smear)  Not medically indicated  Annually if at high risk for developing cervical or vaginal cancer or childbearing age with abnormal Pap test within past 3 years; Every 2 years for women at normal risk   HPV: Once every 5 years; All asymptomatic female Medicare beneficiaries aged 28 to 9 years   Colorectal Cancer Screening Up-to-date   Annually, Fecal Occult Blood Stool (FOBS)   Every 5 yrs, Sigmoidoscopy with FOBS   Every 10 yrs, Colonoscopy   Every 3 yrs, Cologuard   Depression Screening Up-to-date October 08, 2017   As necessary for those with risk factors   Sexually Transmitted Diseases (STDs) & HIV Screening Not medically indicated   As necessary for those with risk factors   Alcohol Misuse Screening Up-to-date   As necessary for those with risk factors   Immunizations:   Prevnar is due   Pt will return There is no immunization history for the selected administration types on file for this patient.  Prevnar 13: 1 dose after age 68   Pneumovax 85: 1 dose 1 year after Prevnar   Influenza: Annually   Advance Directive 5 wishes form given to patient   Once; update as needed   Medical Nutrition Therapy Not medically indicated   As necessary for diabetes or renal disease   Smoking Cessation Counseling Not applicable Counseling given: Not Answered    Frequency: two cessation attempts per year.   Glaucoma Screening Due  . Annually for covered high risk Medicare beneficiaries (one of the following: DM, FHx Glaucoma, African-Americans aged 39+, Hispanic-Americans aged 65+)   Hepatitis C Virus (HCV) Screening Due   Annually only for high risk behavior   Once if born between [birth year] and 1965 and are not considered high risk   Lung Cancer Screening Not applicable   Annually if asymptomatic, tobacco smoking history of at least 30 pack-years (one pack-year = smoking one pack per day for one year; 1 pack = 20 cigarettes), and current smoker or one who has quit smoking within the last 15 years     Your  major risk factors:       Hypertension     Recommendations for improvement:    Low cholesterol diet and Exercise     Referrals:    See After Visit Summary orders         Beacher May, MD   10/08/2017      Hypertension  Chronic problem, present for years  Controlled on current meds  Hyperlipidemia  Chronic problem, present for years  Pt placed on atorvastatin by [facility]  Pt stopped meds after 6 months  Stress incontinence  Occurs when pt coughs for the last year  Has never seen a urologist  Left wrist fracture  Pt seeing PT  Was advised to f/u to wiith a rheumatologist  Pt would like a referral    11 point ROS negative except as above    General Examination:   GENERAL APPEARANCE: alert, in no acute distress, well developed, well nourished, oriented to time, place, and person.   HEAD: normal appearance, atraumatic.   EYES: extraocular movement intact (EOMI), pupils equal, round, reactive to light and accommodation, sclera anicteric, conjunctiva clear.   EARS: tympanic membranes normal bilaterally, external canals normal .   NOSE: normal nasal mucosa, no lesions.   ORAL CAVITY: normal oropharynx, normal lips, mucosa moist, no lesions.   THROAT: normal appearance, clear, no erythema.   NECK/THYROID: neck supple,  no cervical lymphadenopathy, no neck mass palpated, no thyromegaly.  LYMPH NODES: no palpable adenopathy.   SKIN: good turgor, no rashes, no suspicious lesions.   HEART: S1, S2 normal, no murmurs, rubs, gallops, regular rate and rhythm.   LUNGS: normal effort / no distress, normal breath sounds, clear to auscultation bilaterally, no wheezes, rales, rhonchi.   ABDOMEN: bowel sounds present, no hepatosplenomegaly, soft, nontender, nondistended.   MUSCULOSKELETAL: full range of motion, no swelling or deformity.   EXTREMITIES: no edema, no clubbing, cyanosis, or edema.   PERIPHERAL PULSES: 2+ dorsalis pedis, 2+ posterior tibial.   NEUROLOGIC: nonfocal, cranial nerves 2-12 grossly intact, deep tendon  reflexes 2+ symmetrical, normal strength and tone, sensory exam intact.   PSYCH: cognitive function intact, mood/affect full range, speech clear.      1. Essential hypertension  - Comprehensive metabolic panel  - Urinalysis  - lisinopril-hydroCHLOROthiazide (PRINZIDE,ZESTORETIC) 20-25 MG per tablet; Take 1 tablet by mouth daily.  Dispense: 90 tablet; Refill: 1  At goal, continue current meds    2. Pure hypercholesterolemia  - Comprehensive metabolic panel  - Lipid panel  Not currently on meds, suspect will need to start meds based on today's labs    3. Stress incontinence  - Urinalysis  - Ambulatory referral to Urology    4. Arthralgia, unspecified joint  - Ambulatory referral to Rheumatology      Beacher May, MD

## 2017-10-09 ENCOUNTER — Other Ambulatory Visit (INDEPENDENT_AMBULATORY_CARE_PROVIDER_SITE_OTHER): Payer: Self-pay | Admitting: Internal Medicine

## 2017-10-09 ENCOUNTER — Telehealth (INDEPENDENT_AMBULATORY_CARE_PROVIDER_SITE_OTHER): Payer: Self-pay | Admitting: Internal Medicine

## 2017-10-09 ENCOUNTER — Encounter (INDEPENDENT_AMBULATORY_CARE_PROVIDER_SITE_OTHER): Payer: Self-pay | Admitting: Internal Medicine

## 2017-10-09 LAB — LIPID PANEL
Cholesterol / HDL Ratio: 4.3
Cholesterol: 255 mg/dL — ABNORMAL HIGH (ref 0–199)
HDL: 60 mg/dL (ref 40–9999)
LDL Calculated: 168 mg/dL — ABNORMAL HIGH (ref 0–99)
Triglycerides: 133 mg/dL (ref 34–149)
VLDL Calculated: 27 mg/dL (ref 10–40)

## 2017-10-09 LAB — URINE MICROSCOPIC

## 2017-10-09 LAB — HEMOGLOBIN A1C
Average Estimated Glucose: 111.2 mg/dL
Hemoglobin A1C: 5.5 % (ref 4.6–5.9)

## 2017-10-09 LAB — COMPREHENSIVE METABOLIC PANEL
ALT: 16 U/L (ref 0–55)
AST (SGOT): 19 U/L (ref 5–34)
Albumin/Globulin Ratio: 1.4 (ref 0.9–2.2)
Albumin: 4 g/dL (ref 3.5–5.0)
Alkaline Phosphatase: 71 U/L (ref 37–106)
BUN: 12 mg/dL (ref 7.0–19.0)
Bilirubin, Total: 0.5 mg/dL (ref 0.2–1.2)
CO2: 23 mEq/L (ref 21–29)
Calcium: 9.9 mg/dL (ref 8.5–10.5)
Chloride: 107 mEq/L (ref 100–111)
Creatinine: 1 mg/dL (ref 0.4–1.5)
Globulin: 2.9 g/dL (ref 2.0–3.7)
Glucose: 73 mg/dL (ref 70–100)
Potassium: 3.9 mEq/L (ref 3.5–5.1)
Protein, Total: 6.9 g/dL (ref 6.0–8.3)
Sodium: 140 mEq/L (ref 136–145)

## 2017-10-09 LAB — URINALYSIS
Bilirubin, UA: NEGATIVE
Glucose, UA: NEGATIVE
Ketones UA: NEGATIVE
Nitrite, UA: POSITIVE — AB
Specific Gravity UA: 1.029 (ref 1.001–1.035)
Urine pH: 6 (ref 5.0–8.0)
Urobilinogen, UA: 0.2

## 2017-10-09 LAB — GFR: EGFR: 55.6

## 2017-10-09 LAB — HEMOLYSIS INDEX: Hemolysis Index: 5 (ref 0–18)

## 2017-10-09 LAB — HEPATITIS C ANTIBODY: Hepatitis C, AB: NONREACTIVE

## 2017-10-09 MED ORDER — SULFAMETHOXAZOLE-TRIMETHOPRIM 800-160 MG PO TABS
1.00 | ORAL_TABLET | Freq: Two times a day (BID) | ORAL | 0 refills | Status: AC
Start: 2017-10-09 — End: 2017-10-12

## 2017-10-09 NOTE — Telephone Encounter (Signed)
Called the number that was provided but could not leave a message because the mailbox is full.

## 2017-10-09 NOTE — Telephone Encounter (Signed)
Pt was in your office yesterday,she have a chest cold and she is leaving to go out town on Saturday and would like for to prescribe her something for her chest cold. Pt. Number [phone]

## 2017-10-10 ENCOUNTER — Encounter (INDEPENDENT_AMBULATORY_CARE_PROVIDER_SITE_OTHER): Payer: Self-pay | Admitting: Internal Medicine

## 2017-10-11 ENCOUNTER — Encounter (INDEPENDENT_AMBULATORY_CARE_PROVIDER_SITE_OTHER): Payer: Self-pay | Admitting: Internal Medicine

## 2017-10-11 ENCOUNTER — Ambulatory Visit (INDEPENDENT_AMBULATORY_CARE_PROVIDER_SITE_OTHER): Payer: Medicare Other | Admitting: Internal Medicine

## 2017-10-11 VITALS — BP 120/76 | HR 98 | Temp 98.2°F | Wt 243.0 lb

## 2017-10-11 DIAGNOSIS — J40 Bronchitis, not specified as acute or chronic: Secondary | ICD-10-CM

## 2017-10-11 DIAGNOSIS — J4521 Mild intermittent asthma with (acute) exacerbation: Secondary | ICD-10-CM

## 2017-10-11 MED ORDER — ALBUTEROL SULFATE HFA 108 (90 BASE) MCG/ACT IN AERS
2.00 | INHALATION_SPRAY | Freq: Four times a day (QID) | RESPIRATORY_TRACT | 0 refills | Status: DC | PRN
Start: 2017-10-11 — End: 2018-02-19

## 2017-10-11 MED ORDER — BENZONATATE 200 MG PO CAPS
200.00 mg | ORAL_CAPSULE | Freq: Three times a day (TID) | ORAL | 0 refills | Status: DC | PRN
Start: 2017-10-11 — End: 2018-02-19

## 2017-10-11 MED ORDER — AZITHROMYCIN 250 MG PO TABS
ORAL_TABLET | ORAL | 0 refills | Status: AC
Start: 2017-10-11 — End: 2017-10-16

## 2017-10-11 NOTE — Progress Notes (Signed)
Subjective:      Date: 10/11/2017 6:15 PM   Patient ID: Barbara Rubio is a 66 y.o. female.    Chief Complaint:  Chief Complaint   Patient presents with   . Cough       HPI:  Cough   This is a new problem. The current episode started 1 to 4 weeks ago. The problem has been gradually worsening. Associated symptoms include postnasal drip, rhinorrhea and wheezing. Pertinent negatives include no chills, ear pain, fever, headaches, sore throat or shortness of breath. The symptoms are aggravated by cold air and exercise. Treatments tried: otc cold meds. The treatment provided no relief.       Problem List:  Patient Active Problem List   Diagnosis   . Hypertension   . Hyperlipidemia       Current Medications:  Current Outpatient Prescriptions   Medication Sig Dispense Refill   . lisinopril-hydroCHLOROthiazide (PRINZIDE,ZESTORETIC) 20-25 MG per tablet Take 1 tablet by mouth daily. 90 tablet 1   . sulfamethoxazole-trimethoprim (BACTRIM DS) 800-160 MG per tablet Take 1 tablet by mouth 2 (two) times daily.for 3 days 6 tablet 0   . albuterol (PROAIR HFA) 108 (90 Base) MCG/ACT inhaler Inhale 2 puffs into the lungs every 6 (six) hours as needed for Wheezing. 1 Inhaler 0   . azithromycin (ZITHROMAX) 250 MG tablet Two tabs PO day 1 and then one tab PO daily days 2-5 6 tablet 0   . benzonatate (TESSALON) 200 MG capsule Take 1 capsule (200 mg total) by mouth 3 (three) times daily as needed for Cough. 30 capsule 0     No current facility-administered medications for this visit.        Allergies:  No Known Allergies    Past Medical History:  Past Medical History:   Diagnosis Date   . Hyperlipidemia    . Hypertension    . Left wrist fracture 2018       Past Surgical History:  Past Surgical History:   Procedure Laterality Date   . HYSTERECTOMY       partial       Family History:  Family History   Problem Relation Age of Onset   . Hypertension Mother    . Hypertension Father    . Hypertension Sister    . Hypertension Brother         Social History:  Social History     Social History   . Marital status: Single     Spouse name: N/A   . Number of children: N/A   . Years of education: N/A     Occupational History   . Not on file.     Social History Main Topics   . Smoking status: Never Smoker   . Smokeless tobacco: Former Neurosurgeon   . Alcohol use 0.6 oz/week     1 Glasses of wine per week      Comment:  once a month    . Drug use: No   . Sexual activity: Yes     Partners: Male     Birth control/ protection: None     Other Topics Concern   . Not on file     Social History Narrative   . No narrative on file       The following sections were reviewed this encounter by the provider:   Tobacco  Allergies  Meds  Problems  Med Hx  Surg Hx  Fam Hx  Soc Hx  Vitals:  BP 120/76 (BP Site: Left arm, Patient Position: Sitting, Cuff Size: Large)   Pulse 98   Temp 98.2 F (36.8 C) (Oral)   Wt 110.2 kg (243 lb)   SpO2 96%   BMI 38.06 kg/m          ROS:  Review of Systems   Constitutional: Positive for fatigue. Negative for chills and fever.   HENT: Positive for congestion, postnasal drip and rhinorrhea. Negative for ear pain, sinus pain, sinus pressure, sneezing and sore throat.    Respiratory: Positive for cough and wheezing. Negative for shortness of breath.    Gastrointestinal: Negative for abdominal pain, diarrhea, nausea and vomiting.   Neurological: Negative for headaches.         Objective:       Physical Exam:  General Examination:   GENERAL APPEARANCE: alert, in no acute distress, well developed, well nourished, oriented to time, place, and person.   HEAD: normal appearance, atraumatic.   EYES: extraocular movement intact (EOMI), pupils equal, round, reactive to light and accommodation, sclera anicteric, conjunctiva clear.   EARS: tympanic membranes normal bilaterally, external canals normal .   NOSE: normal nasal mucosa, no lesions.   ORAL CAVITY: normal oropharynx, normal lips, mucosa moist, no lesions.   THROAT: normal  appearance, clear, no erythema.   NECK/THYROID: neck supple,  no cervical lymphadenopathy, no neck mass palpated, no thyromegaly.   LYMPH NODES: no palpable adenopathy.   SKIN: good turgor, no rashes, no suspicious lesions.   HEART: S1, S2 normal, no murmurs, rubs, gallops, regular rate and rhythm.   LUNGS: normal effort / no distress, normal breath sounds, clear to auscultation bilaterally, no wheezes, rales, rhonchi.   MUSCULOSKELETAL: full range of motion, no swelling or deformity.   EXTREMITIES: no edema,  NEUROLOGIC: nonfocal, cranial nerves 2-12 grossly intact,  normal strength and tone, sensory exam intact.   PSYCH: cognitive function intact, mood/affect full range, speech clear.      Assessment/Plan:       1. Bronchitis  - azithromycin (ZITHROMAX) 250 MG tablet; Two tabs PO day 1 and then one tab PO daily days 2-5  Dispense: 6 tablet; Refill: 0  - benzonatate (TESSALON) 200 MG capsule; Take 1 capsule (200 mg total) by mouth 3 (three) times daily as needed for Cough.  Dispense: 30 capsule; Refill: 0    2. Mild intermittent reactive airway disease with acute exacerbation  - albuterol (PROAIR HFA) 108 (90 Base) MCG/ACT inhaler; Inhale 2 puffs into the lungs every 6 (six) hours as needed for Wheezing.  Dispense: 1 Inhaler; Refill: 0          Beacher May, MD

## 2017-10-11 NOTE — Progress Notes (Signed)
Have you seen any specialists/other providers since your last visit with us?    No    Arm preference verified?   Yes    The patient is due for mammogram, pap smear, shingles vaccine and pneumonia vaccine

## 2017-10-12 ENCOUNTER — Other Ambulatory Visit (INDEPENDENT_AMBULATORY_CARE_PROVIDER_SITE_OTHER): Payer: Self-pay | Admitting: Internal Medicine

## 2017-10-12 ENCOUNTER — Encounter (INDEPENDENT_AMBULATORY_CARE_PROVIDER_SITE_OTHER): Payer: Self-pay | Admitting: Internal Medicine

## 2017-10-12 ENCOUNTER — Telehealth (INDEPENDENT_AMBULATORY_CARE_PROVIDER_SITE_OTHER): Payer: Self-pay | Admitting: Internal Medicine

## 2017-10-12 MED ORDER — ALBUTEROL SULFATE HFA 108 (90 BASE) MCG/ACT IN AERS
1.00 | INHALATION_SPRAY | Freq: Four times a day (QID) | RESPIRATORY_TRACT | 0 refills | Status: DC | PRN
Start: 2017-10-12 — End: 2018-05-29

## 2017-10-12 NOTE — Telephone Encounter (Signed)
Letter sent.

## 2017-10-13 ENCOUNTER — Other Ambulatory Visit (INDEPENDENT_AMBULATORY_CARE_PROVIDER_SITE_OTHER): Payer: Self-pay | Admitting: Internal Medicine

## 2017-10-13 MED ORDER — ATORVASTATIN CALCIUM 10 MG PO TABS
10.00 mg | ORAL_TABLET | Freq: Every evening | ORAL | 1 refills | Status: DC
Start: 2017-10-13 — End: 2018-05-25

## 2017-10-15 ENCOUNTER — Telehealth (INDEPENDENT_AMBULATORY_CARE_PROVIDER_SITE_OTHER): Payer: Self-pay | Admitting: Internal Medicine

## 2017-10-15 NOTE — Telephone Encounter (Signed)
[place] letter regarding Hep C and cholesterol med was mailed to the patients home.

## 2017-11-08 ENCOUNTER — Other Ambulatory Visit: Payer: Self-pay

## 2017-11-13 ENCOUNTER — Other Ambulatory Visit (INDEPENDENT_AMBULATORY_CARE_PROVIDER_SITE_OTHER): Payer: Self-pay | Admitting: Internal Medicine

## 2017-11-13 DIAGNOSIS — Z0000 Encounter for general adult medical examination without abnormal findings: Secondary | ICD-10-CM

## 2017-11-13 DIAGNOSIS — R928 Other abnormal and inconclusive findings on diagnostic imaging of breast: Secondary | ICD-10-CM

## 2017-11-14 ENCOUNTER — Telehealth (INDEPENDENT_AMBULATORY_CARE_PROVIDER_SITE_OTHER): Payer: Self-pay | Admitting: Internal Medicine

## 2017-11-14 NOTE — Telephone Encounter (Signed)
Order for mammo dig diag bil w cad was faxed to [facility] at [phone].

## 2017-11-20 ENCOUNTER — Encounter (INDEPENDENT_AMBULATORY_CARE_PROVIDER_SITE_OTHER): Payer: Self-pay | Admitting: Internal Medicine

## 2017-11-20 ENCOUNTER — Other Ambulatory Visit (INDEPENDENT_AMBULATORY_CARE_PROVIDER_SITE_OTHER): Payer: Self-pay | Admitting: Internal Medicine

## 2017-11-20 ENCOUNTER — Other Ambulatory Visit: Payer: Self-pay

## 2017-11-20 DIAGNOSIS — Z0000 Encounter for general adult medical examination without abnormal findings: Secondary | ICD-10-CM

## 2017-11-29 ENCOUNTER — Telehealth: Payer: Self-pay | Admitting: *Deleted

## 2017-11-29 NOTE — Telephone Encounter (Signed)
[facility]; forwarded to [email or web address] for email/scan/SLS 02/07

## 2017-12-21 DIAGNOSIS — R32 Unspecified urinary incontinence: Secondary | ICD-10-CM

## 2017-12-21 HISTORY — DX: Unspecified urinary incontinence: R32

## 2018-02-06 ENCOUNTER — Ambulatory Visit (INDEPENDENT_AMBULATORY_CARE_PROVIDER_SITE_OTHER): Payer: Medicare Other | Admitting: Internal Medicine

## 2018-02-19 ENCOUNTER — Encounter (INDEPENDENT_AMBULATORY_CARE_PROVIDER_SITE_OTHER): Payer: Self-pay | Admitting: Internal Medicine

## 2018-02-19 ENCOUNTER — Ambulatory Visit (INDEPENDENT_AMBULATORY_CARE_PROVIDER_SITE_OTHER): Payer: Medicare Other | Admitting: Internal Medicine

## 2018-02-19 VITALS — BP 102/68 | HR 83 | Temp 98.1°F | Wt 241.0 lb

## 2018-02-19 DIAGNOSIS — J019 Acute sinusitis, unspecified: Secondary | ICD-10-CM

## 2018-02-19 MED ORDER — FLUTICASONE PROPIONATE 50 MCG/ACT NA SUSP
2.00 | Freq: Every day | NASAL | 1 refills | Status: DC
Start: 2018-02-19 — End: 2018-05-29

## 2018-02-19 MED ORDER — GUAIFENESIN-CODEINE 100-10 MG/5ML PO SYRP
5.00 mL | ORAL_SOLUTION | Freq: Three times a day (TID) | ORAL | 0 refills | Status: DC | PRN
Start: 2018-02-19 — End: 2018-05-29

## 2018-02-19 MED ORDER — CEFUROXIME AXETIL 500 MG PO TABS
500.00 mg | ORAL_TABLET | Freq: Two times a day (BID) | ORAL | 0 refills | Status: AC
Start: 2018-02-19 — End: 2018-03-01

## 2018-02-19 NOTE — Progress Notes (Signed)
Have you seen any specialists/other providers since your last visit with us?    Yes    Arm preference verified?   Yes    The patient is due for pap smear and shingles vaccine

## 2018-02-19 NOTE — Progress Notes (Signed)
Subjective:      Date: 02/19/2018 6:20 PM   Patient ID: Barbara Rubio is a 67 y.o. female.    Chief Complaint:  Chief Complaint   Patient presents with   . Sinusitis       HPI:  Sinusitis   This is a new problem. Episode onset: 3 weeks. The problem has been waxing and waning since onset. There has been no fever. Associated symptoms include chills, congestion, coughing, sinus pressure and sneezing. Pertinent negatives include no ear pain, shortness of breath or sore throat. Treatments tried: claritin, mucinex, theraflu. The treatment provided mild relief.   pt c/o urinary incontinence with coughing  Pt will be starting pelvic PT this week and has just started estrace intravaginally    Problem List:  Patient Active Problem List   Diagnosis   . Hypertension   . Hyperlipidemia       Current Medications:  Outpatient Prescriptions Marked as Taking for the 02/19/18 encounter (Office Visit) with Beacher May, MD   Medication Sig Dispense Refill   . albuterol (VENTOLIN HFA) 108 (90 Base) MCG/ACT inhaler Inhale 1-2 puffs into the lungs every 6 (six) hours as needed for Wheezing. 1 Inhaler 0   . atorvastatin (LIPITOR) 10 MG tablet Take 1 tablet (10 mg total) by mouth nightly. 90 tablet 1   . estradiol (ESTRACE) 0.1 MG/GM vaginal cream Place 2 g vaginally every mon, wed and fri     . lisinopril-hydroCHLOROthiazide (PRINZIDE,ZESTORETIC) 20-25 MG per tablet Take 1 tablet by mouth daily. 90 tablet 1       Allergies:  No Known Allergies    Past Medical History:  Past Medical History:   Diagnosis Date   . Hyperlipidemia    . Hypertension    . Left wrist fracture 2018       Past Surgical History:  Past Surgical History:   Procedure Laterality Date   . HYSTERECTOMY       partial       Family History:  Family History   Problem Relation Age of Onset   . Hypertension Mother    . Hypertension Father    . Hypertension Sister    . Hypertension Brother        Social History:  Social History     Social History   . Marital status: Single      Spouse name: N/A   . Number of children: N/A   . Years of education: N/A     Occupational History   . Not on file.     Social History Main Topics   . Smoking status: Never Smoker   . Smokeless tobacco: Former Neurosurgeon   . Alcohol use 0.6 oz/week     1 Glasses of wine per week      Comment:  once a month    . Drug use: No   . Sexual activity: Yes     Partners: Male     Birth control/ protection: None     Other Topics Concern   . Not on file     Social History Narrative   . No narrative on file       The following sections were reviewed this encounter by the provider:   Tobacco  Allergies  Meds  Problems  Med Hx  Surg Hx  Fam Hx  Soc Hx          Vitals:  BP 102/68 (BP Site: Left arm, Patient Position: Sitting, Cuff Size: Large)  Pulse 83   Temp 98.1 F (36.7 C) (Oral)   Wt 109.3 kg (241 lb)   SpO2 98%   BMI 37.75 kg/m          ROS:  Review of Systems   Constitutional: Positive for chills and fever. Negative for fatigue.   HENT: Positive for congestion, postnasal drip, rhinorrhea, sinus pain, sinus pressure and sneezing. Negative for ear pain and sore throat.    Respiratory: Positive for cough and wheezing. Negative for shortness of breath.    Gastrointestinal: Negative for abdominal pain, diarrhea, nausea and vomiting.         Objective:       Physical Exam:  General Examination:   GENERAL APPEARANCE: alert, in no acute distress, well developed, well nourished, oriented to time, place, and person.   HEAD: normal appearance, atraumatic.   EYES: extraocular movement intact (EOMI), pupils equal, round, reactive to light and accommodation, sclera anicteric, conjunctiva clear.   EARS: tympanic membranes normal bilaterally, external canals normal .   NOSE: normal nasal mucosa, no lesions.   ORAL CAVITY: normal oropharynx, normal lips, mucosa moist, no lesions.   THROAT: normal appearance, clear, no erythema.   NECK/THYROID: neck supple,  no cervical lymphadenopathy, no neck mass palpated, no thyromegaly.    LYMPH NODES: no palpable adenopathy.   SKIN: good turgor, no rashes, no suspicious lesions.   HEART: S1, S2 normal, no murmurs, rubs, gallops, regular rate and rhythm.   LUNGS: normal effort / no distress, normal breath sounds, clear to auscultation bilaterally, no wheezes, rales, rhonchi.   MUSCULOSKELETAL: full range of motion, no swelling or deformity.   NEUROLOGIC: nonfocal, cranial nerves 2-12 grossly intact,  normal strength and tone, sensory exam intact.   PSYCH: cognitive function intact, mood/affect full range, speech clear.      Assessment/Plan:       1. Acute non-recurrent sinusitis, unspecified location  - cefuroxime (CEFTIN) 500 MG tablet; Take 1 tablet (500 mg total) by mouth 2 (two) times daily for 10 days  Dispense: 20 tablet; Refill: 0  - fluticasone (FLONASE) 50 MCG/ACT nasal spray; 2 sprays by Nasal route daily 2 sprays each nostril daily  Dispense: 1 Bottle; Refill: 1  - guaiFENesin-codeine (ROBITUSSIN AC) 100-10 MG/5ML syrup; Take 5 mLs by mouth 3 (three) times daily as needed for Cough  Dispense: 180 mL; Refill: 0  continue albuterol  Nasal saline rinse  F/u if symptoms persist or worsen.        Beacher May, MD

## 2018-02-20 ENCOUNTER — Telehealth (INDEPENDENT_AMBULATORY_CARE_PROVIDER_SITE_OTHER): Payer: Self-pay | Admitting: Internal Medicine

## 2018-02-20 NOTE — Telephone Encounter (Signed)
Patient called and stated she spoke to [facility] regarding a balance of $400 and then spoke to [facility] and was told the wellness exam was coded wrong for just an EKG. Patient is frustrated and does not want to go back and forth regarding this balance and would like it to be taken care of ASAP. Please advise and contact patient. Thank you.

## 2018-02-20 NOTE — Telephone Encounter (Signed)
Please follow up with patient regarding billing concerns.

## 2018-02-22 NOTE — Telephone Encounter (Signed)
Send request to the billing dept. Noah Delaine to look up the account and how we can fix .

## 2018-04-05 ENCOUNTER — Encounter (INDEPENDENT_AMBULATORY_CARE_PROVIDER_SITE_OTHER): Payer: Self-pay | Admitting: Internal Medicine

## 2018-04-05 ENCOUNTER — Ambulatory Visit (INDEPENDENT_AMBULATORY_CARE_PROVIDER_SITE_OTHER): Payer: Medicare Other | Admitting: Internal Medicine

## 2018-04-05 VITALS — BP 103/70 | HR 78 | Temp 98.0°F | Resp 16 | Ht 65.16 in | Wt 246.6 lb

## 2018-04-05 DIAGNOSIS — M79641 Pain in right hand: Secondary | ICD-10-CM | POA: Insufficient documentation

## 2018-04-05 DIAGNOSIS — M79642 Pain in left hand: Secondary | ICD-10-CM

## 2018-04-05 DIAGNOSIS — M2550 Pain in unspecified joint: Secondary | ICD-10-CM | POA: Insufficient documentation

## 2018-04-05 DIAGNOSIS — M25571 Pain in right ankle and joints of right foot: Secondary | ICD-10-CM

## 2018-04-05 DIAGNOSIS — M79672 Pain in left foot: Secondary | ICD-10-CM | POA: Insufficient documentation

## 2018-04-05 DIAGNOSIS — M79671 Pain in right foot: Secondary | ICD-10-CM

## 2018-04-05 DIAGNOSIS — M25572 Pain in left ankle and joints of left foot: Secondary | ICD-10-CM

## 2018-04-05 DIAGNOSIS — G8929 Other chronic pain: Secondary | ICD-10-CM

## 2018-04-05 MED ORDER — DICLOFENAC SODIUM 75 MG PO TBEC
75.00 mg | DELAYED_RELEASE_TABLET | Freq: Two times a day (BID) | ORAL | 1 refills | Status: DC
Start: 2018-04-05 — End: 2019-01-16

## 2018-04-05 NOTE — Patient Instructions (Signed)
1. Order labs and XR of hands and Rt foot  2. Start diclofenac 75 mg bid as needed  3. Suggest exercise and weight loss  4. Suggest wrist support  5. Suggest diet calcium 1200 mg/d and Vit D 2000 IU daily  6. Follow 3-4 weeks or early

## 2018-04-05 NOTE — Progress Notes (Signed)
Initial Rheumatology Consultation    Chief Complaint:     Chief Complaint   Patient presents with   . Joint Pain   . Hand Pain   . Joint Swelling         HPI:   This patient is a 67 y.o. year old female with Fx of left wrist in 04/2017, Left 1st MTP bunionectomy Sx referred by Beacher May, MD to evaluate joint pain.       She had several falls and She had Fx of left wrist in 04/2017. She did PT and feels better.   She has pain of hands and can not straight of left 3rd and 4th PIPs. She is left handed and and has difficulty writing.   She has intermittent pain of ankles and feet, associated with stiffness in the morning. She has numbness and tingling of hands and feet.  She takes aleve and tylenol with some relief.      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 Her weight has been over 230 lbs for 3-4 yrs.     Her mother had OA of hands.         PMSH:     Past Medical History:   Diagnosis Date   . Hyperlipidemia    . Hypertension    . Left wrist fracture 2018       Past Surgical History:   Procedure Laterality Date   . HYSTERECTOMY       partial       Allergies:   No Known Allergies    Meds:     Prior to Admission medications    Medication Sig Start Date End Date Taking? Authorizing Provider   atorvastatin (LIPITOR) 10 MG tablet Take 1 tablet (10 mg total) by mouth nightly. 10/13/17  Yes Beacher May, MD   estradiol (ESTRACE) 0.1 MG/GM vaginal cream Place 2 g vaginally every mon, wed and fri   Yes [provider]   lisinopril-hydroCHLOROthiazide (PRINZIDE,ZESTORETIC) 20-25 MG per tablet Take 1 tablet by mouth daily. 10/08/17  Yes Beacher May, MD   albuterol (VENTOLIN HFA) 108 (90 Base) MCG/ACT inhaler Inhale 1-2 puffs into the lungs every 6 (six) hours as needed for Wheezing. 10/12/17   Beacher May, MD   fluticasone Aleda Grana) 50 MCG/ACT nasal spray 2 sprays by Nasal route daily 2 sprays each nostril daily 02/19/18   Beacher May, MD   guaiFENesin-codeine [facility])  100-10 MG/5ML syrup Take 5 mLs by mouth 3 (three) times daily as needed for Cough 02/19/18   Beacher May, MD       FH:     Family History   Problem Relation Age of Onset   . Hypertension Mother    . Hypertension Father    . Hypertension Sister    . Hypertension Brother        SH:     Social History     Social History   . Marital status: Single     Spouse name: N/A   . Number of children: N/A   . Years of education: N/A     Occupational History   . Not on file.     Social History Main Topics   . Smoking status: Never Smoker   . Smokeless tobacco: Former Neurosurgeon   . Alcohol use 0.6 oz/week     1 Glasses of wine per week      Comment:  once a month    .  Drug use: No   . Sexual activity: Yes     Partners: Male     Birth control/ protection: None     Other Topics Concern   . Not on file     Social History Narrative   . No narrative on file       ROS:   All other systems reviewed and negative except as described above.  Denies any fevers, chills or night sweats.  +fatigue. Denies rash,  malar rash or photosensitivity, discoid lesions, psoriasis, nail changes or Raynaud's. Denies hair loss or alopecia. Denies Sicca symptoms, nasal or oral ulcers or epistaxis. Denies cough, shortness of breath, chest pain, palpation or exercise intolerance. Denies nausea, vomiting, diarrhea, melena, hematochezia or dysphagia.  Denies dysuria and hematuria. Denies  focal weakness or seizure activity.        PHYSICAL EXAM:     Vitals:    04/05/18 0917   BP: 103/70   Pulse: 78   Resp: 16   Temp: 98 F (36.7 C)       General appearance - alert, well appearing, and in no distress  Mental status - alert, oriented to person, place, and time  Eyes - pupils equal and reactive, extraocular eye movements intact  Ears - bilateral TM's and external ear canals normal  Nose - normal and patent, no erythema, discharge or polyps  Mouth - mucous membranes moist, pharynx normal without lesions  Neck - supple, no significant adenopathy  Lymphatics - no  palpable lymphadenopathy, no hepatosplenomegaly  Chest - clear to auscultation, no wheezes, rales or rhonchi, symmetric air entry  Heart - normal rate, regular rhythm, normal S1, S2, no murmurs, rubs, clicks or gallops  Abdomen - soft, nontender, nondistended, no masses or organomegaly  Back exam - full range of motion, no tenderness, palpable spasm or pain on motion  Neurological - alert, oriented, normal speech, no focal findings or movement disorder noted  Musculoskeletal -  Good range of cervical spine.  No crepitus on TMJ auscultation. Flexion deformity of left 3rd and 4th PIPs.  OA of PIPs and DIPs, worse of left 2nd DIP and left 3rd and 4th PIPs. No tenderness of MCPs. Good range of motion of shoulders, elbows, wrists and small joints of hands. Good range of motion of hips, knees and ankles.  Knees crepitus with range of motion of knees without effusions. Ankles without effusions.  + Rt 1st tenderness of MTP.  Extremities - peripheral pulses normal, no pedal edema, no clubbing or cyanosis  Skin - normal coloration and turgor, no rashes, no suspicious skin lesions noted        Labs:         Radiology:             ASSESSMENT:   Marvelene Stoneberg is a 67 y.o. female with Fx of left wrist in 04/2017, Left 1st MTP bunionectomy Sx.   She has osteoarthritis of hands and feet.       PLAN:   1. Order labs and XR of hands and Rt foot  2. Start diclofenac 75 mg bid as needed  3. Suggest exercise and weight loss  4. Suggest wrist support  5. Suggest diet calcium 1200 mg/d and Vit D 2000 IU daily  6. Follow 3-4 weeks or early

## 2018-05-10 ENCOUNTER — Ambulatory Visit (INDEPENDENT_AMBULATORY_CARE_PROVIDER_SITE_OTHER): Payer: Medicare Other | Admitting: Internal Medicine

## 2018-05-24 ENCOUNTER — Telehealth (INDEPENDENT_AMBULATORY_CARE_PROVIDER_SITE_OTHER): Payer: Self-pay | Admitting: Internal Medicine

## 2018-05-24 NOTE — Telephone Encounter (Signed)
Patient called requesting a call back from the nurse regarding a referral that she would like to request  Pt can be reached at [phone]

## 2018-05-25 ENCOUNTER — Other Ambulatory Visit (INDEPENDENT_AMBULATORY_CARE_PROVIDER_SITE_OTHER): Payer: Self-pay | Admitting: Internal Medicine

## 2018-05-25 NOTE — Telephone Encounter (Signed)
Pt needs to make an appointment for further refills of lipitor.  She should come fasting to the appointment

## 2018-05-27 ENCOUNTER — Telehealth (INDEPENDENT_AMBULATORY_CARE_PROVIDER_SITE_OTHER): Payer: Self-pay | Admitting: Internal Medicine

## 2018-05-27 DIAGNOSIS — R928 Other abnormal and inconclusive findings on diagnostic imaging of breast: Secondary | ICD-10-CM

## 2018-05-27 NOTE — Telephone Encounter (Signed)
Message left in patient voicemail to call to set up an appointment to follow up cholesterol.

## 2018-05-27 NOTE — Telephone Encounter (Signed)
Please call pt .  mammo referral ready

## 2018-05-28 NOTE — Telephone Encounter (Signed)
Please follow up with patient regarding referral

## 2018-05-29 ENCOUNTER — Ambulatory Visit (FREE_STANDING_LABORATORY_FACILITY): Payer: Medicare Other | Admitting: Internal Medicine

## 2018-05-29 ENCOUNTER — Encounter (INDEPENDENT_AMBULATORY_CARE_PROVIDER_SITE_OTHER): Payer: Self-pay | Admitting: Internal Medicine

## 2018-05-29 VITALS — BP 96/63 | HR 68 | Temp 97.5°F | Resp 16 | Ht 66.93 in | Wt 243.4 lb

## 2018-05-29 DIAGNOSIS — M25571 Pain in right ankle and joints of right foot: Secondary | ICD-10-CM

## 2018-05-29 DIAGNOSIS — M79641 Pain in right hand: Secondary | ICD-10-CM

## 2018-05-29 DIAGNOSIS — M79672 Pain in left foot: Secondary | ICD-10-CM

## 2018-05-29 DIAGNOSIS — M2550 Pain in unspecified joint: Secondary | ICD-10-CM

## 2018-05-29 DIAGNOSIS — M79671 Pain in right foot: Secondary | ICD-10-CM

## 2018-05-29 DIAGNOSIS — M25572 Pain in left ankle and joints of left foot: Secondary | ICD-10-CM

## 2018-05-29 DIAGNOSIS — G8929 Other chronic pain: Secondary | ICD-10-CM

## 2018-05-29 DIAGNOSIS — M79642 Pain in left hand: Secondary | ICD-10-CM

## 2018-05-29 LAB — CBC AND DIFFERENTIAL
Absolute NRBC: 0 10*3/uL (ref 0.00–0.00)
Basophils Absolute Automated: 0.04 10*3/uL (ref 0.00–0.08)
Basophils Automated: 0.8 %
Eosinophils Absolute Automated: 0.07 10*3/uL (ref 0.00–0.44)
Eosinophils Automated: 1.4 %
Hematocrit: 41 % (ref 34.7–43.7)
Hgb: 12.2 g/dL (ref 11.4–14.8)
Immature Granulocytes Absolute: 0.01 10*3/uL (ref 0.00–0.07)
Immature Granulocytes: 0.2 %
Lymphocytes Absolute Automated: 2.23 10*3/uL (ref 0.42–3.22)
Lymphocytes Automated: 44.8 %
MCH: 26.5 pg (ref 25.1–33.5)
MCHC: 29.8 g/dL — ABNORMAL LOW (ref 31.5–35.8)
MCV: 89.1 fL (ref 78.0–96.0)
MPV: 12.9 fL — ABNORMAL HIGH (ref 8.9–12.5)
Monocytes Absolute Automated: 0.39 10*3/uL (ref 0.21–0.85)
Monocytes: 7.8 %
Neutrophils Absolute: 2.24 10*3/uL (ref 1.10–6.33)
Neutrophils: 45 %
Nucleated RBC: 0 /100 WBC (ref 0.0–0.0)
Platelets: 167 10*3/uL (ref 142–346)
RBC: 4.6 10*6/uL (ref 3.90–5.10)
RDW: 14 % (ref 11–15)
WBC: 4.98 10*3/uL (ref 3.10–9.50)

## 2018-05-29 LAB — COMPREHENSIVE METABOLIC PANEL
ALT: 21 U/L (ref 0–55)
AST (SGOT): 20 U/L (ref 5–34)
Albumin/Globulin Ratio: 1.3 (ref 0.9–2.2)
Albumin: 3.8 g/dL (ref 3.5–5.0)
Alkaline Phosphatase: 75 U/L (ref 37–106)
BUN: 19 mg/dL (ref 7.0–19.0)
Bilirubin, Total: 0.3 mg/dL (ref 0.2–1.2)
CO2: 26 mEq/L (ref 21–29)
Calcium: 9.7 mg/dL (ref 8.5–10.5)
Chloride: 106 mEq/L (ref 100–111)
Creatinine: 1 mg/dL (ref 0.4–1.5)
Globulin: 3 g/dL (ref 2.0–3.7)
Glucose: 95 mg/dL (ref 70–100)
Potassium: 4.3 mEq/L (ref 3.5–5.1)
Protein, Total: 6.8 g/dL (ref 6.0–8.3)
Sodium: 141 mEq/L (ref 136–145)

## 2018-05-29 LAB — HEMOLYSIS INDEX: Hemolysis Index: 7 (ref 0–18)

## 2018-05-29 LAB — C-REACTIVE PROTEIN: C-Reactive Protein: 0.3 mg/dL (ref 0.0–0.8)

## 2018-05-29 LAB — RHEUMATOID FACTOR: Rheumatoid Factor: <15 (ref 0.0–30.0)

## 2018-05-29 LAB — SEDIMENTATION RATE: Sed Rate: 14 mm/Hr (ref 0–20)

## 2018-05-29 LAB — GFR: EGFR: 55.5

## 2018-05-29 NOTE — Telephone Encounter (Signed)
Referral left in the front desk.pt notified states she will pick up on Friday.

## 2018-05-29 NOTE — Telephone Encounter (Signed)
Called pt, spoke to pt & advised order ready for pick up. printed and put in envelope, pt will pick up on Friday.

## 2018-05-29 NOTE — Progress Notes (Signed)
Initial Rheumatology Consultation    Chief Complaint:       Joint pain     HPI:   This patient is a 67 y.o. year old female with Fx of left wrist in 04/2017, Left 1st MTP bunionectomy Sx referred by Beacher May, MD to evaluate joint pain.       She had several falls and She had Fx of left wrist in 04/2017. She did PT and feels better. She had bone density on 11/20/2017, which was normal.    She has pain of hands and can not straight of left 3rd and 4th PIPs. She is left handed and and has difficulty writing.   She has intermittent pain of ankles and feet, associated with stiffness in the morning. She has numbness and tingling of hands and feet.  She takes diclofenac 75 mg bid as needed and tylenol with some relief.      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 Her weight has been over 230 lbs for 3-4 yrs.   She has not down labs and XRs.     Her mother had OA of hands.         PMSH:     Past Medical History:   Diagnosis Date   . Hyperlipidemia    . Hypertension    . Left wrist fracture 2018       Past Surgical History:   Procedure Laterality Date   . HYSTERECTOMY       partial       Allergies:   No Known Allergies    Meds:     Prior to Admission medications    Medication Sig Start Date End Date Taking? Authorizing Provider   atorvastatin (LIPITOR) 10 MG tablet Take 1 tablet (10 mg total) by mouth nightly. 10/13/17  Yes Beacher May, MD   estradiol (ESTRACE) 0.1 MG/GM vaginal cream Place 2 g vaginally every mon, wed and fri   Yes [provider]   lisinopril-hydroCHLOROthiazide (PRINZIDE,ZESTORETIC) 20-25 MG per tablet Take 1 tablet by mouth daily. 10/08/17  Yes Beacher May, MD   albuterol (VENTOLIN HFA) 108 (90 Base) MCG/ACT inhaler Inhale 1-2 puffs into the lungs every 6 (six) hours as needed for Wheezing. 10/12/17   Beacher May, MD   fluticasone Aleda Grana) 50 MCG/ACT nasal spray 2 sprays by Nasal route daily 2 sprays each nostril daily 02/19/18   Beacher May, MD    guaiFENesin-codeine [facility]) 100-10 MG/5ML syrup Take 5 mLs by mouth 3 (three) times daily as needed for Cough 02/19/18   Beacher May, MD       FH:     Family History   Problem Relation Age of Onset   . Hypertension Mother    . Hypertension Father    . Hypertension Sister    . Hypertension Brother        SH:     Social History     Social History   . Marital status: Single     Spouse name: N/A   . Number of children: N/A   . Years of education: N/A     Occupational History   . Not on file.     Social History Main Topics   . Smoking status: Never Smoker   . Smokeless tobacco: Former Neurosurgeon   . Alcohol use 0.6 oz/week     1 Glasses of wine per week      Comment:  once a  month    . Drug use: No   . Sexual activity: Yes     Partners: Male     Birth control/ protection: None     Other Topics Concern   . Not on file     Social History Narrative   . No narrative on file       ROS:   All other systems reviewed and negative except as described above.  Denies any fevers, chills or night sweats.  +fatigue. Denies rash,  malar rash or photosensitivity, discoid lesions, psoriasis, nail changes or Raynaud's. Denies hair loss or alopecia. Denies Sicca symptoms, nasal or oral ulcers or epistaxis. Denies cough, shortness of breath, chest pain, palpation or exercise intolerance. Denies nausea, vomiting, diarrhea, melena, hematochezia or dysphagia.  Denies dysuria and hematuria. Denies  focal weakness or seizure activity.        PHYSICAL EXAM:     Vitals:    05/29/18 1001   BP: 96/63   Pulse: 68   Resp: 16   Temp: 97.5 F (36.4 C)       General appearance - alert, well appearing, and in no distress  Mental status - alert, oriented to person, place, and time  Eyes - pupils equal and reactive, extraocular eye movements intact  Ears - bilateral TM's and external ear canals normal  Nose - normal and patent, no erythema, discharge or polyps  Mouth - mucous membranes moist, pharynx normal without lesions  Neck - supple, no  significant adenopathy  Lymphatics - no palpable lymphadenopathy, no hepatosplenomegaly  Chest - clear to auscultation, no wheezes, rales or rhonchi, symmetric air entry  Heart - normal rate, regular rhythm, normal S1, S2, no murmurs, rubs, clicks or gallops  Abdomen - soft, nontender, nondistended, no masses or organomegaly  Back exam - full range of motion, no tenderness, palpable spasm or pain on motion  Neurological - alert, oriented, normal speech, no focal findings or movement disorder noted  Musculoskeletal -  Good range of cervical spine.  No crepitus on TMJ auscultation. Flexion deformity of left 3rd and 4th PIPs.  OA of PIPs and DIPs, worse of left 2nd DIP and left 3rd and 4th PIPs. Tenderness of left wrist.  No tenderness of MCPs. Good range of motion of shoulders, elbows, wrists and small joints of hands. Good range of motion of hips, knees and ankles.  Knees crepitus with range of motion of knees without effusions. Ankles without effusions.  + Rt 1st tenderness of MTP.  Extremities - peripheral pulses normal, no pedal edema, no clubbing or cyanosis  Skin - normal coloration and turgor, no rashes, no suspicious skin lesions noted        Labs:     Reviewed     Radiology:     Reviewed         ASSESSMENT:   Barbara Rubio is a 67 y.o. female with Fx of left wrist in 04/2017, Left 1st MTP bunionectomy Sx.   She has osteoarthritis of hands and feet.       PLAN:   1. Order labs and XR of hands and Rt foot  2. Suggest diclofenac 75 mg bid as needed  3. Suggest exercise and weight loss  4. Suggest wrist support  5. Suggest diet calcium 1200 mg/d and Vit D 2000 IU daily  6. Follow 2-3 months or early

## 2018-05-29 NOTE — Patient Instructions (Signed)
1. Order labs and XR of hands and Rt foot  2. Suggest diclofenac 75 mg bid as needed  3. Suggest exercise and weight loss  4. Suggest wrist support  5. Suggest diet calcium 1200 mg/d and Vit D 2000 IU daily  6. Follow 2-3 months or early

## 2018-05-31 ENCOUNTER — Encounter (INDEPENDENT_AMBULATORY_CARE_PROVIDER_SITE_OTHER): Payer: Self-pay | Admitting: Internal Medicine

## 2018-05-31 ENCOUNTER — Ambulatory Visit (FREE_STANDING_LABORATORY_FACILITY): Payer: Medicare Other | Admitting: Internal Medicine

## 2018-05-31 ENCOUNTER — Other Ambulatory Visit: Payer: Self-pay

## 2018-05-31 VITALS — BP 94/62 | HR 63 | Temp 97.7°F | Resp 16 | Ht 67.0 in | Wt 244.0 lb

## 2018-05-31 DIAGNOSIS — I10 Essential (primary) hypertension: Secondary | ICD-10-CM

## 2018-05-31 DIAGNOSIS — Z87891 Personal history of nicotine dependence: Secondary | ICD-10-CM

## 2018-05-31 DIAGNOSIS — E782 Mixed hyperlipidemia: Secondary | ICD-10-CM

## 2018-05-31 LAB — LIPID PANEL
Cholesterol / HDL Ratio: 3.6
Cholesterol: 199 mg/dL (ref 0–199)
HDL: 56 mg/dL (ref 40–9999)
LDL Calculated: 120 mg/dL — ABNORMAL HIGH (ref 0–99)
Triglycerides: 117 mg/dL (ref 34–149)
VLDL Calculated: 23 mg/dL (ref 10–40)

## 2018-05-31 LAB — HEMOLYSIS INDEX: Hemolysis Index: 8 (ref 0–18)

## 2018-05-31 MED ORDER — ATORVASTATIN CALCIUM 10 MG PO TABS
ORAL_TABLET | ORAL | 1 refills | Status: DC
Start: 2018-05-31 — End: 2018-12-13

## 2018-05-31 NOTE — Progress Notes (Signed)
Have you seen any specialists/other providers since your last visit with us?    Yes    Arm preference verified?   Yes    The patient is due for nothing at this time, HM is up-to-date.

## 2018-06-02 LAB — CYCLIC CITRUL PEPTIDE ANTIBODY, IGG: CCP, Antibody IgG: <16 (ref <20)

## 2018-06-03 ENCOUNTER — Telehealth (INDEPENDENT_AMBULATORY_CARE_PROVIDER_SITE_OTHER): Payer: Self-pay | Admitting: Internal Medicine

## 2018-06-03 NOTE — Progress Notes (Signed)
Subjective:      Date: 05/31/2018 1:30 PM   Patient ID: Barbara Rubio is a 68 y.o. female.    Chief Complaint:  Chief Complaint   Patient presents with   . Hypertension     feeling vertigo       HPI:  HPI    Initial visit with me     Complain of dizziness last few days.  Dizziness mainly when she stands up from sitting position  Last for few seconds  Blood pressure low today at office and last visit  Blood pressure at home 90 x 60      She is fasting today for repeat cholesterol check    Hypertension   This is a chronic problem. . Pertinent negatives include no anxiety, blurred vision, chest pain, headaches, malaise/fatigue, neck pain, orthopnea, palpitations, peripheral edema, PND or sweats.  There are no compliance problems.    Hyperlipidemia   This is a chronic problem. The problem is controlled. Pertinent negatives include no chest pain, leg pain or myalgias. Current antihyperlipidemic treatment includes statins. There are no compliance problems.  Risk factors for coronary artery disease include hypertension.    Problem List:  Patient Active Problem List   Diagnosis   . Essential hypertension   . Mixed hyperlipidemia   . Pain in both hands   . Pain in both feet   . Arthralgia, unspecified joint   . Chronic pain of both ankles       Current Medications:  Outpatient Prescriptions Marked as Taking for the 05/31/18 encounter (Office Visit) with Morrison Old, MD   Medication Sig Dispense Refill   . atorvastatin (LIPITOR) 10 MG tablet TAKE 1 TABLET BY MOUTH EVERY DAY AT NIGHT 90 tablet 1   . diclofenac (VOLTAREN) 75 MG EC tablet Take 1 tablet (75 mg total) by mouth 2 (two) times daily 60 tablet 1   . estradiol (ESTRACE) 0.1 MG/GM vaginal cream Place 2 g vaginally every mon, wed and fri     . lisinopril-hydroCHLOROthiazide (PRINZIDE,ZESTORETIC) 20-25 MG per tablet Take 1 tablet by mouth daily. 90 tablet 1   . [DISCONTINUED] atorvastatin (LIPITOR) 10 MG tablet TAKE 1 TABLET BY MOUTH EVERY DAY AT NIGHT 30 tablet 0        Allergies:  No Known Allergies    Past Medical History:  Past Medical History:   Diagnosis Date   . Hyperlipidemia    . Hypertension    . Left wrist fracture 2018       Past Surgical History:  Past Surgical History:   Procedure Laterality Date   . HYSTERECTOMY       partial       Family History:  Family History   Problem Relation Age of Onset   . Hypertension Mother    . Hypertension Father    . Hypertension Sister    . Hypertension Brother        Social History:  Social History     Social History   . Marital status: Single     Spouse name: N/A   . Number of children: N/A   . Years of education: N/A     Occupational History   . Not on file.     Social History Main Topics   . Smoking status: Never Smoker   . Smokeless tobacco: Former Neurosurgeon   . Alcohol use 0.6 oz/week     1 Glasses of wine per week      Comment:  once a  month    . Drug use: No   . Sexual activity: Yes     Partners: Male     Birth control/ protection: None     Other Topics Concern   . Not on file     Social History Narrative   . No narrative on file       The following sections were reviewed this encounter by the provider:   Tobacco  Allergies  Meds  Problems  Med Hx  Surg Hx  Fam Hx  Soc Hx          Vitals:  BP 94/62 (BP Site: Left arm, Patient Position: Sitting, Cuff Size: Large)   Pulse 63   Temp 97.7 F (36.5 C) (Oral)   Resp 16   Ht 1.702 m (5\' 7" )   Wt 110.7 kg (244 lb)   SpO2 97%   BMI 38.22 kg/m        ROS:  General/Constitutional:   Denies Chills. Denies Fatigue. Denies Fever.   Ophthalmologic:   Denies Blurred vision.   ENT:   Denies Nasal Discharge. Denies Sinus pain. Denies Sore throat.   Respiratory:   Denies Cough. Denies Shortness of breath. Denies Wheezing.   Cardiovascular:   Denies Chest pain. Denies Chest pain with exertion. Denies Palpitations. Denies  Swelling in hands/feet.   Gastrointestinal:   Denies Abdominal pain. Denies Constipation. Denies Diarrhea. Denies Nausea. Denies  Vomiting.   Skin:   Denies Rash.    Neurologic:   Denies Dizziness. Denies Headache. Denies Tingling/Numbness.        Objective:       Physical Exam:  General Examination:   GENERAL APPEARANCE: alert, in no acute distress, well developed, well nourished,  oriented to time, place, and person.   ORAL CAVITY: normal oropharynx, normal lips, mucosa moist.   THROAT: normal appearance, clear.   NECK/THYROID: neck supple, no carotid bruit, carotid pulse 2+ bilaterally, no cervical  lymphadenopathy, no neck mass palpated, no jugular venous distention, no  thyromegaly.   HEART: S1, S2 normal, no murmurs, rubs, gallops, regular rate and rhythm.   LUNGS: normal effort / no distress, normal breath sounds, clear to auscultation  bilaterally, no wheezes, rales, rhonchi.   EXTREMITIES: no clubbing, cyanosis, or edema bilaterally.   PERIPHERAL PULSES: 2+ dorsalis pedis, 2+ posterior tibial bilaterally.   NEUROLOGIC: alert and oriented.        Assessment/Plan:       1. Mixed hyperlipidemia  - Lipid panel  - atorvastatin (LIPITOR) 10 MG tablet; TAKE 1 TABLET BY MOUTH EVERY DAY AT NIGHT  Dispense: 90 tablet; Refill: 1  Recheck labs    2. Essential hypertension  Blood pressure low  Advised to cut down the pill in half -patient just got 90-day supply   blood pressure log  Call MD if blood pressure less than 90 x 60 or more than 140 x 90  Follow-up in 4 weeks for repeat blood pressure check        Morrison Old, MD

## 2018-06-03 NOTE — Telephone Encounter (Signed)
Contacted pt and informed her about her lab results. She would like a call back to discuss GFR and her concerns with her medications. Please call back at [phone].

## 2018-06-03 NOTE — Telephone Encounter (Signed)
Please call pt. Her labs were normal. Thanks.

## 2018-06-03 NOTE — Telephone Encounter (Signed)
Talked to pt. Suggest tylenol as needed.

## 2018-06-06 ENCOUNTER — Ambulatory Visit (INDEPENDENT_AMBULATORY_CARE_PROVIDER_SITE_OTHER): Payer: Medicare Other | Admitting: Internal Medicine

## 2018-06-27 ENCOUNTER — Encounter (INDEPENDENT_AMBULATORY_CARE_PROVIDER_SITE_OTHER): Payer: Self-pay | Admitting: Internal Medicine

## 2018-06-27 ENCOUNTER — Ambulatory Visit (INDEPENDENT_AMBULATORY_CARE_PROVIDER_SITE_OTHER): Payer: Medicare Other | Admitting: Internal Medicine

## 2018-06-27 VITALS — BP 101/68 | HR 69 | Temp 97.8°F | Wt 241.8 lb

## 2018-06-27 DIAGNOSIS — I10 Essential (primary) hypertension: Secondary | ICD-10-CM

## 2018-06-27 MED ORDER — LISINOPRIL 5 MG PO TABS
5.00 mg | ORAL_TABLET | Freq: Every day | ORAL | 1 refills | Status: DC
Start: 2018-06-27 — End: 2018-12-13

## 2018-06-27 NOTE — Progress Notes (Signed)
Have you seen any specialists/other providers since your last visit with us?    Yes    Arm preference verified?   Yes    The patient is due for pap smear, influenza vaccine, shingles vaccine and pneumonia vaccine

## 2018-06-27 NOTE — Progress Notes (Signed)
Subjective:      Date: 06/27/2018 8:59 AM   Patient ID: Barbara Rubio is a 67 y.o. female.    Chief Complaint:  Chief Complaint   Patient presents with   . Hypertension     follow up       HPI:  Hypertension   The patient is being seen for a routine follow-up of hypertension. Hypertension is classified as essential. Comorbid illnesses include hyperlipidemia. There is no interval history of dyspnea, fatigue and palpitations.  Current therapy includes ACE inhibitors and diuretics. Patient is compliant with the current regimen.   Pt taking 1/2 tab as is feeling dizzy The patient's blood pressure response to medications is overcorrected.      Problem List:  Patient Active Problem List   Diagnosis   . Essential hypertension   . Mixed hyperlipidemia   . Pain in both hands   . Pain in both feet   . Arthralgia, unspecified joint   . Chronic pain of both ankles       Current Medications:  Outpatient Prescriptions Marked as Taking for the 06/27/18 encounter (Office Visit) with Beacher May, MD   Medication Sig Dispense Refill   . atorvastatin (LIPITOR) 10 MG tablet TAKE 1 TABLET BY MOUTH EVERY DAY AT NIGHT 90 tablet 1   . diclofenac (VOLTAREN) 75 MG EC tablet Take 1 tablet (75 mg total) by mouth 2 (two) times daily 60 tablet 1   . estradiol (ESTRACE) 0.1 MG/GM vaginal cream Place 2 g vaginally every mon, wed and fri     . lisinopril-hydroCHLOROthiazide (PRINZIDE,ZESTORETIC) 20-25 MG per tablet Take 1 tablet by mouth daily. 90 tablet 1       Allergies:  No Known Allergies    Past Medical History:  Past Medical History:   Diagnosis Date   . Hyperlipidemia    . Hypertension    . Left wrist fracture 2018       Past Surgical History:  Past Surgical History:   Procedure Laterality Date   . HYSTERECTOMY       partial       Family History:  Family History   Problem Relation Age of Onset   . Hypertension Mother    . Hypertension Father    . Hypertension Sister    . Hypertension Brother        Social History:  Social History      Social History   . Marital status: Single     Spouse name: N/A   . Number of children: N/A   . Years of education: N/A     Occupational History   . Not on file.     Social History Main Topics   . Smoking status: Never Smoker   . Smokeless tobacco: Former Neurosurgeon   . Alcohol use 0.6 oz/week     1 Glasses of wine per week      Comment:  once a month    . Drug use: No   . Sexual activity: Yes     Partners: Male     Birth control/ protection: None     Other Topics Concern   . Not on file     Social History Narrative   . No narrative on file       The following sections were reviewed this encounter by the provider:   Allergies  Meds  Problems         Vitals:  BP 101/68 (BP Site: Left arm, Patient Position: Sitting,  Cuff Size: Large)   Pulse 69   Temp 97.8 F (36.6 C) (Oral)   Wt 109.7 kg (241 lb 12.8 oz)   SpO2 97%   BMI 37.87 kg/m          ROS:  Review of Systems   Constitutional: Negative for fatigue.   Respiratory: Negative for cough, shortness of breath and wheezing.    Cardiovascular: Negative for chest pain and palpitations.   Gastrointestinal: Negative for abdominal pain, diarrhea, nausea and vomiting.   Neurological: Positive for dizziness. Negative for weakness, numbness and headaches.         Objective:       Physical Exam:  General Examination:   GENERAL APPEARANCE: alert, in no acute distress, well developed, well nourished, oriented to time, place, and person.   HEAD: normal appearance, atraumatic.   EYES: extraocular movement intact (EOMI), pupils equal, round, reactive to light and accommodation, sclera anicteric, conjunctiva clear.   ORAL CAVITY: normal oropharynx, normal lips, mucosa moist, no lesions.   THROAT: normal appearance, clear, no erythema.   NECK/THYROID: neck supple,  no cervical lymphadenopathy, no neck mass palpated, no thyromegaly.   LYMPH NODES: no palpable adenopathy.   SKIN: good turgor, no rashes, no suspicious lesions.   HEART: S1, S2 normal, no murmurs, rubs, gallops,  regular rate and rhythm.   LUNGS: normal effort / no distress, normal breath sounds, clear to auscultation bilaterally, no wheezes, rales, rhonchi.    MUSCULOSKELETAL: full range of motion, no swelling or deformity.   EXTREMITIES: no edema,  NEUROLOGIC: nonfocal, cranial nerves 2-12 grossly intact, normal strength and tone, sensory exam intact.   PSYCH: cognitive function intact, mood/affect full range, speech clear.      Assessment/Plan:       1. 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

## 2018-07-08 ENCOUNTER — Ambulatory Visit (INDEPENDENT_AMBULATORY_CARE_PROVIDER_SITE_OTHER): Payer: Medicare Other | Admitting: Family Medicine

## 2018-07-30 ENCOUNTER — Encounter (INDEPENDENT_AMBULATORY_CARE_PROVIDER_SITE_OTHER): Payer: Self-pay | Admitting: Internal Medicine

## 2018-07-30 ENCOUNTER — Ambulatory Visit (INDEPENDENT_AMBULATORY_CARE_PROVIDER_SITE_OTHER): Payer: Medicare Other | Admitting: Internal Medicine

## 2018-07-30 VITALS — BP 120/61 | HR 77 | Temp 97.9°F | Wt 240.6 lb

## 2018-07-30 DIAGNOSIS — I10 Essential (primary) hypertension: Secondary | ICD-10-CM

## 2018-07-30 DIAGNOSIS — Z23 Encounter for immunization: Secondary | ICD-10-CM

## 2018-07-30 NOTE — Progress Notes (Signed)
Subjective:      Date: 07/30/2018 4:33 PM   Patient ID: Barbara Rubio is a 67 y.o. female.    Chief Complaint:  Chief Complaint   Patient presents with   . Hypertension       HPI:  HPI      HYPERTENSION  Visit type: routine follow-up of HTN  Type: essential  Associated Conditions: no evidence of CHF  Compliance: is compliant with the current regimen  Comorbid illness: none  Interval events: no interval events  Interval symptoms: none  Patient denies: dizziness, chest pain, shortness of breath, palpitations, pre-syncope, edema, DOE, orthopnea or PND  Average ambulatory systolic pressure: does not check ambulatory blood pressure  Average ambulatory diastolic pressure: does not check ambulatory blood pressure  End organ damage from HTN: none  Exercise: regular  Response to medications: has been good  Prior testing: a chemistry profile showed normal renal function  Additional concerns: In addition, pt also needs Tdap vaccine    Requesting for Tdap vaccine  Problem List:  Patient Active Problem List   Diagnosis   . Essential hypertension   . Mixed hyperlipidemia   . Pain in both hands   . Pain in both feet   . Arthralgia, unspecified joint   . Chronic pain of both ankles       Current Medications:  Outpatient Medications Marked as Taking for the 07/30/18 encounter (Office Visit) with Morrison Old, MD   Medication Sig Dispense Refill   . atorvastatin (LIPITOR) 10 MG tablet TAKE 1 TABLET BY MOUTH EVERY DAY AT NIGHT 90 tablet 1   . estradiol (ESTRACE) 0.1 MG/GM vaginal cream Place 2 g vaginally every mon, wed and fri     . lisinopril (PRINIVIL,ZESTRIL) 5 MG tablet Take 1 tablet (5 mg total) by mouth daily 90 tablet 1       Allergies:  No Known Allergies    Past Medical History:  Past Medical History:   Diagnosis Date   . Hyperlipidemia    . Hypertension    . Left wrist fracture 2018       Past Surgical History:  Past Surgical History:   Procedure Laterality Date   . HYSTERECTOMY       partial       Family History:  Family  History   Problem Relation Age of Onset   . Hypertension Mother    . Hypertension Father    . Hypertension Sister    . Hypertension Brother        Social History:  Social History     Socioeconomic History   . Marital status: Single     Spouse name: Not on file   . Number of children: Not on file   . Years of education: Not on file   . Highest education level: Not on file   Occupational History   . Not on file   Social Needs   . Financial resource strain: Not on file   . Food insecurity:     Worry: Not on file     Inability: Not on file   . Transportation needs:     Medical: Not on file     Non-medical: Not on file   Tobacco Use   . Smoking status: Never Smoker   . Smokeless tobacco: Former Estate agent and Sexual Activity   . Alcohol use: Yes     Alcohol/week: 1.0 standard drinks     Types: 1 Glasses of wine per week  Comment:  once a month    . Drug use: No   . Sexual activity: Yes     Partners: Male     Birth control/protection: None   Lifestyle   . Physical activity:     Days per week: Not on file     Minutes per session: Not on file   . Stress: Not on file   Relationships   . Social connections:     Talks on phone: Not on file     Gets together: Not on file     Attends religious service: Not on file     Active member of club or organization: Not on file     Attends meetings of clubs or organizations: Not on file     Relationship status: Not on file   . Intimate partner violence:     Fear of current or ex partner: Not on file     Emotionally abused: Not on file     Physically abused: Not on file     Forced sexual activity: Not on file   Other Topics Concern   . Not on file   Social History Narrative   . Not on file       The following sections were reviewed this encounter by the provider:   Tobacco  Allergies  Meds  Problems  Med Hx  Surg Hx  Fam Hx  Soc Hx          Vitals:  BP 120/61 (BP Site: Left arm, Patient Position: Sitting, Cuff Size: Large)   Pulse 77   Temp 97.9 F (36.6 C) (Oral)   Wt  109.1 kg (240 lb 9.6 oz)   SpO2 96%   BMI 37.68 kg/m       ROS:  General/Constitutional:   Denies Chills. Denies Fatigue. Denies Fever.   Ophthalmologic:   Denies Blurred vision.   ENT:   Denies Nasal Discharge. Denies Sinus pain. Denies Sore throat.   Respiratory:   Denies Cough. Denies Shortness of breath. Denies Wheezing.   Cardiovascular:   Denies Chest pain. Denies Chest pain with exertion. Denies Palpitations. Denies  Swelling in hands/feet.   Gastrointestinal:   Denies Abdominal pain. Denies Constipation. Denies Diarrhea. Denies Nausea. Denies  Vomiting.   Skin:   Denies Rash.   Neurologic:   Denies Dizziness. Denies Headache. Denies Tingling/Numbness.        Objective:       Physical Exam:  General Examination:   GENERAL APPEARANCE: alert, in no acute distress, well developed, well nourished,  oriented to time, place, and person.   ORAL CAVITY: normal oropharynx, normal lips, mucosa moist.   THROAT: normal appearance, clear.   NECK/THYROID: neck supple, no carotid bruit, carotid pulse 2+ bilaterally, no cervical  lymphadenopathy, no neck mass palpated, no jugular venous distention, no  thyromegaly.   HEART: S1, S2 normal, no murmurs, rubs, gallops, regular rate and rhythm.   LUNGS: normal effort / no distress, normal breath sounds, clear to auscultation  bilaterally, no wheezes, rales, rhonchi.   EXTREMITIES: no clubbing, cyanosis, or edema bilaterally.   PERIPHERAL PULSES: 2+ dorsalis pedis, 2+ posterior tibial bilaterally.   NEUROLOGIC: alert and oriented.         Assessment/Plan:       1. Essential hypertension  Pt stable on current medication regimen, no concerns. Continue current medication  2. Immunization due  - Tdap vaccine greater than or equal to 66yo IM  Marrian Salvage, MD

## 2018-07-30 NOTE — Progress Notes (Signed)
Have you seen any specialists/other providers since your last visit with [place]?    No    Arm preference verified?   Yes    The patient is due for pap smear, influenza vaccine, shingles vaccine, pneumonia vaccine and [facility] Care Plan Letter.

## 2018-08-30 ENCOUNTER — Ambulatory Visit (INDEPENDENT_AMBULATORY_CARE_PROVIDER_SITE_OTHER): Payer: Medicare Other | Admitting: Internal Medicine

## 2018-09-11 ENCOUNTER — Encounter (INDEPENDENT_AMBULATORY_CARE_PROVIDER_SITE_OTHER): Payer: Self-pay

## 2018-09-23 ENCOUNTER — Encounter (INDEPENDENT_AMBULATORY_CARE_PROVIDER_SITE_OTHER): Payer: Medicare Other | Admitting: Internal Medicine

## 2018-10-08 ENCOUNTER — Encounter (INDEPENDENT_AMBULATORY_CARE_PROVIDER_SITE_OTHER): Payer: Self-pay

## 2018-10-09 ENCOUNTER — Encounter (INDEPENDENT_AMBULATORY_CARE_PROVIDER_SITE_OTHER): Payer: Self-pay

## 2018-10-30 ENCOUNTER — Encounter (INDEPENDENT_AMBULATORY_CARE_PROVIDER_SITE_OTHER): Payer: Self-pay

## 2018-12-04 ENCOUNTER — Encounter (INDEPENDENT_AMBULATORY_CARE_PROVIDER_SITE_OTHER): Payer: Self-pay

## 2018-12-05 ENCOUNTER — Encounter (INDEPENDENT_AMBULATORY_CARE_PROVIDER_SITE_OTHER): Payer: Medicare Other | Admitting: Internal Medicine

## 2018-12-12 ENCOUNTER — Encounter (INDEPENDENT_AMBULATORY_CARE_PROVIDER_SITE_OTHER): Payer: Medicare Other | Admitting: Internal Medicine

## 2018-12-13 ENCOUNTER — Ambulatory Visit (INDEPENDENT_AMBULATORY_CARE_PROVIDER_SITE_OTHER): Payer: Medicare Other | Admitting: Internal Medicine

## 2018-12-13 ENCOUNTER — Encounter (INDEPENDENT_AMBULATORY_CARE_PROVIDER_SITE_OTHER): Payer: Self-pay | Admitting: Internal Medicine

## 2018-12-13 VITALS — BP 136/86 | HR 61 | Temp 97.7°F | Wt 236.0 lb

## 2018-12-13 DIAGNOSIS — E782 Mixed hyperlipidemia: Secondary | ICD-10-CM

## 2018-12-13 DIAGNOSIS — I10 Essential (primary) hypertension: Secondary | ICD-10-CM

## 2018-12-13 LAB — HEMOLYSIS INDEX: Hemolysis Index: 5 (ref 0–18)

## 2018-12-13 LAB — COMPREHENSIVE METABOLIC PANEL
ALT: 14 U/L (ref 0–55)
AST (SGOT): 18 U/L (ref 5–34)
Albumin/Globulin Ratio: 1.1 (ref 0.9–2.2)
Albumin: 3.9 g/dL (ref 3.5–5.0)
Alkaline Phosphatase: 90 U/L (ref 37–106)
BUN: 12 mg/dL (ref 7.0–19.0)
Bilirubin, Total: 0.6 mg/dL (ref 0.2–1.2)
CO2: 27 mEq/L (ref 21–29)
Calcium: 9.7 mg/dL (ref 8.5–10.5)
Chloride: 106 mEq/L (ref 100–111)
Creatinine: 0.9 mg/dL (ref 0.4–1.5)
Globulin: 3.4 g/dL (ref 2.0–3.7)
Glucose: 82 mg/dL (ref 70–100)
Potassium: 4.7 mEq/L (ref 3.5–5.1)
Protein, Total: 7.3 g/dL (ref 6.0–8.3)
Sodium: 142 mEq/L (ref 136–145)

## 2018-12-13 LAB — LIPID PANEL
Cholesterol / HDL Ratio: 3.2
Cholesterol: 220 mg/dL — ABNORMAL HIGH (ref 0–199)
HDL: 68 mg/dL (ref 40–9999)
LDL Calculated: 134 mg/dL — ABNORMAL HIGH (ref 0–99)
Triglycerides: 91 mg/dL (ref 34–149)
VLDL Calculated: 18 mg/dL (ref 10–40)

## 2018-12-13 LAB — GFR: EGFR: >60

## 2018-12-13 MED ORDER — ATORVASTATIN CALCIUM 10 MG PO TABS
ORAL_TABLET | ORAL | 1 refills | Status: DC
Start: 2018-12-13 — End: 2019-05-02

## 2018-12-13 MED ORDER — LISINOPRIL 5 MG PO TABS
5.0000 mg | ORAL_TABLET | Freq: Every day | ORAL | 1 refills | Status: DC
Start: 2018-12-13 — End: 2019-05-02

## 2018-12-13 NOTE — Progress Notes (Signed)
Have you seen any specialists/other providers since your last visit with [place]?    No    Arm preference verified?   Yes    The patient is due for influenza vaccine, shingles vaccine, pneumonia vaccine and Care Plan letter

## 2018-12-13 NOTE — Progress Notes (Signed)
Subjective:      Date: 12/13/2018 12:00 PM   Patient ID: Barbara Rubio is a 68 y.o. female.    Chief Complaint:  Chief Complaint   Patient presents with    Medication Refill     Lisinopril, lipitor       HPI:  HPI      HYPERTENSION  Visit type: routine follow-up of HTN  Type: essential  Associated Conditions: no evidence of CHF  Compliance: is compliant with the current regimen  Comorbid illness: none  Interval events: no interval events  Interval symptoms: none  Patient denies: dizziness, chest pain, shortness of breath, palpitations, pre-syncope, edema, DOE, orthopnea or PND  Average ambulatory systolic pressure: does not check ambulatory blood pressure  Average ambulatory diastolic pressure: does not check ambulatory blood pressure  End organ damage from HTN: none  Exercise: regular  Response to medications: has been good  Prior testing: a chemistry profile showed normal renal function         Pt has a history of hyperlipidemia and is compliant with anti-lipidemic therapy.  Pt denies side effects of anti-lipidemic therapy - specifically denies myalgia.     Problem List:  Patient Active Problem List   Diagnosis    Essential hypertension    Mixed hyperlipidemia    Pain in both hands    Pain in both feet    Arthralgia, unspecified joint    Chronic pain of both ankles       Current Medications:  Outpatient Medications Marked as Taking for the 12/13/18 encounter (Office Visit) with Morrison Old, MD   Medication Sig Dispense Refill    atorvastatin (LIPITOR) 10 MG tablet TAKE 1 TABLET BY MOUTH EVERY DAY AT NIGHT 90 tablet 1    diclofenac (VOLTAREN) 75 MG EC tablet Take 1 tablet (75 mg total) by mouth 2 (two) times daily 60 tablet 1    estradiol (ESTRACE) 0.1 MG/GM vaginal cream Place 2 g vaginally every mon, wed and fri      lisinopril (PRINIVIL,ZESTRIL) 5 MG tablet Take 1 tablet (5 mg total) by mouth daily 90 tablet 1       Allergies:  No Known Allergies    Past Medical History:  Past Medical History:    Diagnosis Date    Hyperlipidemia     Hypertension     Left wrist fracture 2018       Past Surgical History:  Past Surgical History:   Procedure Laterality Date    HYSTERECTOMY       partial       Family History:  Family History   Problem Relation Age of Onset    Hypertension Mother     Hypertension Father     Hypertension Sister     Hypertension Brother        Social History:  Social History     Socioeconomic History    Marital status: Single     Spouse name: Not on file    Number of children: Not on file    Years of education: Not on file    Highest education level: Not on file   Occupational History    Not on file   Social Needs    Financial resource strain: Not on file    Food insecurity:     Worry: Not on file     Inability: Not on file    Transportation needs:     Medical: Not on file     Non-medical: Not on file  Tobacco Use    Smoking status: Never Smoker    Smokeless tobacco: Former Estate agent and Sexual Activity    Alcohol use: Yes     Alcohol/week: 1.0 standard drinks     Types: 1 Glasses of wine per week     Comment:  once a month     Drug use: No    Sexual activity: Yes     Partners: Male     Birth control/protection: None   Lifestyle    Physical activity:     Days per week: Not on file     Minutes per session: Not on file    Stress: Not on file   Relationships    Social connections:     Talks on phone: Not on file     Gets together: Not on file     Attends religious service: Not on file     Active member of club or organization: Not on file     Attends meetings of clubs or organizations: Not on file     Relationship status: Not on file    Intimate partner violence:     Fear of current or ex partner: Not on file     Emotionally abused: Not on file     Physically abused: Not on file     Forced sexual activity: Not on file   Other Topics Concern    Not on file   Social History Narrative    Not on file       The following sections were reviewed this encounter by the  provider:   Tobacco   Allergies   Meds   Problems   Med Hx   Surg Hx   Fam Hx          Vitals:  BP 136/86 (BP Site: Left arm, Patient Position: Sitting, Cuff Size: Large)    Pulse 61    Temp 97.7 F (36.5 C) (Oral)    Wt 107 kg (236 lb)    SpO2 97%    BMI 36.96 kg/m       ROS:  General/Constitutional:   Denies Chills. Denies Fatigue. Denies Fever.   Ophthalmologic:   Denies Blurred vision.   ENT:   Denies Nasal Discharge. Denies Sinus pain. Denies Sore throat.   Respiratory:   Denies Cough. Denies Shortness of breath. Denies Wheezing.   Cardiovascular:   Denies Chest pain. Denies Chest pain with exertion. Denies Palpitations. Denies  Swelling in hands/feet.   Gastrointestinal:   Denies Abdominal pain. Denies Constipation. Denies Diarrhea. Denies Nausea. Denies  Vomiting.   Skin:   Denies Rash.   Neurologic:   Denies Dizziness. Denies Headache. Denies Tingling/Numbness.        Objective:       Physical Exam:  General Examination:   GENERAL APPEARANCE: alert, in no acute distress, well developed, well nourished,  oriented to time, place, and person.   ORAL CAVITY: normal oropharynx, normal lips, mucosa moist.   THROAT: normal appearance, clear.   NECK/THYROID: neck supple, no carotid bruit, carotid pulse 2+ bilaterally, no cervical  lymphadenopathy, no neck mass palpated, no jugular venous distention, no  thyromegaly.   HEART: S1, S2 normal, no murmurs, rubs, gallops, regular rate and rhythm.   LUNGS: normal effort / no distress, normal breath sounds, clear to auscultation  bilaterally, no wheezes, rales, rhonchi.   EXTREMITIES: no clubbing, cyanosis, or edema bilaterally.   PERIPHERAL PULSES: 2+ dorsalis pedis, 2+ posterior tibial bilaterally.  NEUROLOGIC: alert and oriented.         Assessment/Plan:       1. Essential hypertension     2. Mixed hyperlipidemia  Lipid panel    Comprehensive metabolic panel         Advised compliance importance and cardiovascular morbidity and mortality associated with uncontrolled  BP- patient expressed understanding   DASH   Exercise  Weight loss  F/u 6 months    Lifestyle and dietary changes discussed in detail  .  The patient is advised to begin progressive daily aerobic exercise program and follow a low fat, low cholesterol diet.            Morrison Old, MD

## 2018-12-29 ENCOUNTER — Encounter (INDEPENDENT_AMBULATORY_CARE_PROVIDER_SITE_OTHER): Payer: Self-pay

## 2019-01-09 ENCOUNTER — Other Ambulatory Visit: Payer: Self-pay | Admitting: Orthopaedic Surgery

## 2019-01-09 ENCOUNTER — Encounter (INDEPENDENT_AMBULATORY_CARE_PROVIDER_SITE_OTHER): Payer: Self-pay | Admitting: Internal Medicine

## 2019-01-09 ENCOUNTER — Telehealth (INDEPENDENT_AMBULATORY_CARE_PROVIDER_SITE_OTHER): Payer: Self-pay | Admitting: Internal Medicine

## 2019-01-09 DIAGNOSIS — R928 Other abnormal and inconclusive findings on diagnostic imaging of breast: Secondary | ICD-10-CM

## 2019-01-09 NOTE — Telephone Encounter (Signed)
Please call pt.  She needs to call [facility] to schedule another mammogram.  They have sent her several letters but she has not responded.

## 2019-01-15 NOTE — Telephone Encounter (Signed)
Spoke with patient she stated she did not have a referral for her mammogram ,  Also she stated she's having a lots off back pain , she schedule an appointment for tomorrow.

## 2019-01-16 ENCOUNTER — Encounter (INDEPENDENT_AMBULATORY_CARE_PROVIDER_SITE_OTHER): Payer: Self-pay | Admitting: Internal Medicine

## 2019-01-16 ENCOUNTER — Ambulatory Visit (INDEPENDENT_AMBULATORY_CARE_PROVIDER_SITE_OTHER): Payer: Medicare Other | Admitting: Internal Medicine

## 2019-01-16 VITALS — BP 140/80 | HR 69 | Temp 98.0°F | Wt 249.0 lb

## 2019-01-16 DIAGNOSIS — Z1239 Encounter for other screening for malignant neoplasm of breast: Secondary | ICD-10-CM

## 2019-01-16 DIAGNOSIS — M545 Low back pain, unspecified: Secondary | ICD-10-CM

## 2019-01-16 LAB — POCT URINALYSIS DIPSTIX (10)(MULTI-TEST)
Bilirubin, UA POCT: NEGATIVE
Glucose, UA POCT: NEGATIVE mg/dL
Ketones, UA POCT: NEGATIVE mg/dL
Nitrite, UA POCT: NEGATIVE
POCT Leukocytes, UA: NEGATIVE
POCT Spec Gravity, UA: 1.02 (ref 1.001–1.035)
POCT pH, UA: 5.5 (ref 5–8)
Urobilinogen, UA: 0.2 mg/dL

## 2019-01-16 MED ORDER — FLURBIPROFEN 100 MG PO TABS
100.0000 mg | ORAL_TABLET | Freq: Two times a day (BID) | ORAL | 1 refills | Status: DC
Start: 2019-01-16 — End: 2019-05-02

## 2019-01-16 NOTE — Progress Notes (Addendum)
Subjective:      Date: 01/16/2019 3:02 PM   Patient ID: Barbara Rubio is a 68 y.o. female.    Chief Complaint:  Chief Complaint   Patient presents with    Back Pain     10 days  ago       HPI:  Back Pain   This is a new problem. The current episode started in the past 7 days. The problem occurs intermittently. The problem has been waxing and waning since onset. The pain is present in the lumbar spine. The pain does not radiate. The pain is at a severity of 2/10 (at it's worst is 5/10). The pain is mild. The symptoms are aggravated by lying down (some movements). Associated symptoms include numbness (left hand) and weakness (left hand). Pertinent negatives include no abdominal pain, chest pain or fever. She has tried heat for the symptoms. The treatment provided mild relief.     Pt saw ortho 3/13, had pain in wrist from fracture, radiating up arm  Referred to neuro  Had MRI of shoulder 3/19  Pt was told she has pinched nerve in shoulder causing pain  Pt started on gabapentin, will teleconference with neuro in 2 weeks.    Pt also requesting referral for mammogram    Problem List:  Patient Active Problem List   Diagnosis    Essential hypertension    Mixed hyperlipidemia    Pain in both hands    Pain in both feet    Arthralgia, unspecified joint    Chronic pain of both ankles       Current Medications:  Outpatient Medications Marked as Taking for the 01/16/19 encounter (Office Visit) with Beacher May, MD   Medication Sig Dispense Refill    atorvastatin (LIPITOR) 10 MG tablet TAKE 1 TABLET BY MOUTH EVERY DAY AT NIGHT 90 tablet 1    estradiol (ESTRACE) 0.1 MG/GM vaginal cream Place 2 g vaginally every mon, wed and fri      gabapentin (NEURONTIN) 100 MG capsule Take 100 mg by mouth nightly         lisinopril (PRINIVIL,ZESTRIL) 5 MG tablet Take 1 tablet (5 mg total) by mouth daily 90 tablet 1       Allergies:  No Known Allergies    Past Medical History:  Past Medical History:   Diagnosis Date     Hyperlipidemia     Hypertension     Left wrist fracture 2018       Past Surgical History:  Past Surgical History:   Procedure Laterality Date    HYSTERECTOMY       partial       Family History:  Family History   Problem Relation Age of Onset    Hypertension Mother     Hypertension Father     Hypertension Sister     Hypertension Brother        Social History:  Social History     Socioeconomic History    Marital status: Single     Spouse name: Not on file    Number of children: Not on file    Years of education: Not on file    Highest education level: Not on file   Occupational History    Not on file   Social Needs    Financial resource strain: Not on file    Food insecurity:     Worry: Not on file     Inability: Not on file    Transportation needs:  Medical: Not on file     Non-medical: Not on file   Tobacco Use    Smoking status: Never Smoker    Smokeless tobacco: Former Neurosurgeon   Substance and Sexual Activity    Alcohol use: Yes     Alcohol/week: 1.0 standard drinks     Types: 1 Glasses of wine per week     Comment:  once a month     Drug use: No    Sexual activity: Yes     Partners: Male     Birth control/protection: None   Lifestyle    Physical activity:     Days per week: Not on file     Minutes per session: Not on file    Stress: Not on file   Relationships    Social connections:     Talks on phone: Not on file     Gets together: Not on file     Attends religious service: Not on file     Active member of club or organization: Not on file     Attends meetings of clubs or organizations: Not on file     Relationship status: Not on file    Intimate partner violence:     Fear of current or ex partner: Not on file     Emotionally abused: Not on file     Physically abused: Not on file     Forced sexual activity: Not on file   Other Topics Concern    Not on file   Social History Narrative    Not on file       The following sections were reviewed this encounter by the provider:   Tobacco    Allergies   Meds   Problems   Med Hx   Surg Hx   Fam Hx          Vitals:  BP 140/80 (BP Site: Left arm, Patient Position: Sitting, Cuff Size: Large)    Pulse 69    Temp 98 F (36.7 C) (Oral)    Wt 112.9 kg (249 lb)    SpO2 98%    BMI 39.00 kg/m          ROS:  Review of Systems   Constitutional: Negative for chills and fever.   HENT: Negative for congestion, rhinorrhea and sore throat.    Respiratory: Negative for cough, shortness of breath and wheezing.    Cardiovascular: Negative for chest pain and palpitations.   Gastrointestinal: Negative for abdominal pain, diarrhea, nausea and vomiting.   Musculoskeletal: Positive for back pain.   Neurological: Positive for weakness (left hand) and numbness (left hand).         Objective:       Physical Exam:  General Examination:   GENERAL APPEARANCE: alert, in no acute distress, well developed, well nourished, oriented to time, place, and person.   HEAD: normal appearance, atraumatic.   EYES: extraocular movement intact (EOMI), pupils equal, round, reactive to light and accommodation, sclera anicteric, conjunctiva clear.   ORAL CAVITY: normal oropharynx, normal lips, mucosa moist, no lesions.   THROAT: normal appearance, clear, no erythema.   NECK/THYROID: neck supple,  no cervical lymphadenopathy, no neck mass palpated, no thyromegaly.   LYMPH NODES: no palpable adenopathy.   SKIN: good turgor, no rashes, no suspicious lesions.   HEART: S1, S2 normal, no murmurs, rubs, gallops, regular rate and rhythm.   LUNGS: normal effort / no distress, normal breath sounds, clear to auscultation bilaterally, no wheezes,  rales, rhonchi.   MUSCULOSKELETAL: full range of motion, no swelling or deformity.   EXTREMITIES: no edema  NEUROLOGIC: nonfocal, cranial nerves 2-12 grossly intact  PSYCH: cognitive function intact, mood/affect full range, speech clear.      Assessment/Plan:       1. Acute bilateral low back pain without sciatica  - POCT UA Dipstix (10)(Multi-Test)  - flurbiprofen  (ANSAID) 100 MG tablet; Take 1 tablet (100 mg total) by mouth 2 (two) times daily  Dispense: 30 tablet; Refill: 1  - CT Abdomen Pelvis WO IV/ WO PO Cont; Future    2. Breast cancer screening  - Mammo Digital Screening Bilateral W Cad; Future          Beacher May, MD

## 2019-01-16 NOTE — Progress Notes (Signed)
Have you seen any specialists/other providers since your last visit with [place]?   yes  Yes    Arm preference verified?   Yes    The patient is due for shingles vaccine, pneumonia vaccine and Wellness  physical

## 2019-01-16 NOTE — Patient Instructions (Signed)
Back Exercises: Pelvic Tilt    To start, lie on your back with your knees bent and feet flat on the floor. Dont press your neck or lower back to the floor. Breathe deeply. You should feel comfortable and relaxed in this position:   Tighten yourstomach and buttocks, and press your lower back toward the floor. This should be a small, subtle movement. This should not increase your pain.   Hold for5 to 15seconds. Release.   Repeat2 to 5times.  [employer]Well last reviewed this educational content on 12/21/2016   2000-2020 [employer], [place]. [address], [place], [place] [address]. All rights reserved. This information is not intended as a substitute for professional medical care. Always follow your healthcare professional's instructions.        Back Exercises: Lower Back Stretch    To start, sit in a chair with your feet flat on the floor. Shift your weight slightly forward. Relax, and keep your ears, shoulders, and hips aligned while you do the following:   Sit with your feet well apart.   Bend forward and touch the floor with the backs of your hands. Relax and let your body drop.   Hold for20seconds. Return to starting position.   Repeat2times.  StayWell last reviewed this educational content on 08/23/2016   2000-2020 [employer], [place]. [address] [place], [place] [address]. All rights reserved. This information is not intended as a substitute for professional medical care. Always follow your healthcare professional's instructions.        Back Exercises: Lower Back Rotation    To start, lie on your back with your knees bent and feet flat on the floor. Dont press your neck or lower back to the floor. Breathe deeply. You should feel comfortable and relaxed in this position.   Drop both knees to one side. Turn your head to the other side. Keep your shoulders flat on the floor.   Do not push through pain.   Hold for20seconds.   Slowly switch sides.   Repeat2 to 5times.   StayWell last reviewed this educational content on 12/21/2016   2000-2020 [employer], [place]. [address], [place], [place] [address]. All rights reserved. This information is not intended as a substitute for professional medical care. Always follow your healthcare professional's instructions.        Back Exercises: Abdominal Lift Brace with Marching    The abdominal lift brace with march strengthens your lower abdominal muscles, helping you keep your pelvis and back stable:   Lie on the floor with both knees bent. Put your feet flat on the floor and your arms by your sides. Tighten your abdominal muscles. Be sure to continue to breathe.   Lift one bent knee about 2 inches then return it to the floor and lift the other about 2 inches.Keep your abdominal muscles tight and continue to breathe. These motions should be slow and controlled without your pelvis rocking side to side.   Repeat10times.  StayWell last reviewed this educational content on 12/21/2016   2000-2020 [employer], [place]. [address], [place], [place] [address]. All rights reserved. This information is not intended as a substitute for professional medical care. Always follow your healthcare professional's instructions.        Back Exercises: Abdominal Lift Brace with Marching    The abdominal lift brace with march strengthens your lower abdominal muscles, helping you keep your pelvis and back stable:   Lie on the floor with  both knees bent. Put your feet flat on the floor and your arms by your sides. Tighten your abdominal muscles. Be sure to continue to breathe.   Lift one bent knee about 2 inches then return it to the floor and lift the other about 2 inches.Keep your abdominal muscles tight and continue to breathe. These motions should be slow and controlled without your pelvis rocking side to side.   Repeat10times.  StayWell last reviewed this educational content on 12/21/2016   2000-2020 [employer], [place]. [address] [place], [place] [address]. All rights reserved. This information is not intended as a substitute for professional medical care. Always follow your healthcare professional's instructions.        Back Exercises: Pelvic Tilt    To start, lie on your back with your knees bent and feet flat on the floor. Dont press your neck or lower back to the floor. Breathe deeply. You should feel comfortable and relaxed in this position:   Tighten yourstomach and buttocks, and press your lower back toward the floor. This should be a small, subtle movement. This should not increase your pain.   Hold for5 to 15seconds. Release.   Repeat2 to 5times.  StayWell last reviewed this educational content on 12/21/2016   2000-2020 [employer], [place]. [address], [place], [place] [address]. All rights reserved. This information is not intended as a substitute for professional medical care. Always follow your healthcare professional's instructions.

## 2019-01-16 NOTE — Addendum Note (Signed)
Addended by: Joen Laura on: 01/16/2019 03:02 PM     Modules accepted: Orders

## 2019-01-24 ENCOUNTER — Other Ambulatory Visit: Payer: Self-pay | Admitting: Internal Medicine

## 2019-01-24 ENCOUNTER — Other Ambulatory Visit (INDEPENDENT_AMBULATORY_CARE_PROVIDER_SITE_OTHER): Payer: Self-pay | Admitting: Internal Medicine

## 2019-01-24 DIAGNOSIS — M545 Low back pain, unspecified: Secondary | ICD-10-CM

## 2019-01-25 ENCOUNTER — Encounter (INDEPENDENT_AMBULATORY_CARE_PROVIDER_SITE_OTHER): Payer: Self-pay | Admitting: Internal Medicine

## 2019-01-25 ENCOUNTER — Other Ambulatory Visit (INDEPENDENT_AMBULATORY_CARE_PROVIDER_SITE_OTHER): Payer: Self-pay | Admitting: Internal Medicine

## 2019-01-25 DIAGNOSIS — R319 Hematuria, unspecified: Secondary | ICD-10-CM

## 2019-01-27 ENCOUNTER — Encounter (INDEPENDENT_AMBULATORY_CARE_PROVIDER_SITE_OTHER): Payer: Self-pay | Admitting: Internal Medicine

## 2019-01-27 ENCOUNTER — Ambulatory Visit (INDEPENDENT_AMBULATORY_CARE_PROVIDER_SITE_OTHER): Payer: Medicare Other | Admitting: Internal Medicine

## 2019-01-27 VITALS — BP 136/74 | HR 79 | Temp 98.2°F | Wt 251.0 lb

## 2019-01-27 DIAGNOSIS — M545 Low back pain, unspecified: Secondary | ICD-10-CM

## 2019-01-27 DIAGNOSIS — R319 Hematuria, unspecified: Secondary | ICD-10-CM

## 2019-01-27 LAB — POCT URINALYSIS DIPSTIX (10)(MULTI-TEST)
Bilirubin, UA POCT: NEGATIVE
Blood, UA POCT: NEGATIVE
Glucose, UA POCT: >=1000 mg/dL — AB
Ketones, UA POCT: NEGATIVE mg/dL
Nitrite, UA POCT: NEGATIVE
POCT Spec Gravity, UA: 1.025 (ref 1.001–1.035)
POCT pH, UA: 5 (ref 5–8)
Urobilinogen, UA: 0.2 mg/dL

## 2019-01-27 NOTE — Progress Notes (Signed)
Have you seen any specialists/other providers since your last visit with [place]?   Dr. Allen Norris- nerve specilaist   Yes    Arm preference verified?   Yes    The patient is due for shingles vaccine, pneumonia vaccine and pcmh letter; medicare wellness.

## 2019-01-27 NOTE — Progress Notes (Signed)
Subjective:      Date: 01/27/2019 3:56 PM   Patient ID: Barbara Rubio is a 68 y.o. female.    Chief Complaint:  Chief Complaint   Patient presents with    Hematuria     f/u; lower back pain, has improved.        HPI:  HPI  Hematuria  Pt seen 3/26 with abd pain  labwork normal except for hematuria  Pt had CT on 4/3, normal, no kidney stones  Pt only has slight back pain  Pt is taking ansaid and gabapentin which helped  Pt forgot meds at home when went to daughter's house, pain greatly increased that day  Pain with movement  Improved when took meds  No associated symptoms, has some tingling in right hand  Pt spoke with neurosurgeon, was given referral for PT for cervical radiculopathy    Problem List:  Patient Active Problem List   Diagnosis    Essential hypertension    Mixed hyperlipidemia    Pain in both hands    Pain in both feet    Arthralgia, unspecified joint    Chronic pain of both ankles       Current Medications:  Outpatient Medications Marked as Taking for the 01/27/19 encounter (Clinical Support) with Joen Laura, MD   Medication Sig Dispense Refill    atorvastatin (LIPITOR) 10 MG tablet TAKE 1 TABLET BY MOUTH EVERY DAY AT NIGHT 90 tablet 1    estradiol (ESTRACE) 0.1 MG/GM vaginal cream Place 2 g vaginally every mon, wed and fri      flurbiprofen (ANSAID) 100 MG tablet Take 1 tablet (100 mg total) by mouth 2 (two) times daily 30 tablet 1    gabapentin (NEURONTIN) 100 MG capsule Take 100 mg by mouth nightly         lisinopril (PRINIVIL,ZESTRIL) 5 MG tablet Take 1 tablet (5 mg total) by mouth daily 90 tablet 1       Allergies:  No Known Allergies    Past Medical History:  Past Medical History:   Diagnosis Date    Hyperlipidemia     Hypertension     Left wrist fracture 2018       Past Surgical History:  Past Surgical History:   Procedure Laterality Date    HYSTERECTOMY       partial       Family History:  Family History   Problem Relation Age of Onset    Hypertension Mother     Hypertension  Father     Hypertension Sister     Hypertension Brother        Social History:  Social History     Socioeconomic History    Marital status: Single     Spouse name: Not on file    Number of children: Not on file    Years of education: Not on file    Highest education level: Not on file   Occupational History    Not on file   Social Needs    Financial resource strain: Not on file    Food insecurity:     Worry: Not on file     Inability: Not on file    Transportation needs:     Medical: Not on file     Non-medical: Not on file   Tobacco Use    Smoking status: Never Smoker    Smokeless tobacco: Former Neurosurgeon   Substance and Sexual Activity    Alcohol use: Yes  Alcohol/week: 1.0 standard drinks     Types: 1 Glasses of wine per week     Comment:  once a month     Drug use: No    Sexual activity: Yes     Partners: Male     Birth control/protection: None   Lifestyle    Physical activity:     Days per week: Not on file     Minutes per session: Not on file    Stress: Not on file   Relationships    Social connections:     Talks on phone: Not on file     Gets together: Not on file     Attends religious service: Not on file     Active member of club or organization: Not on file     Attends meetings of clubs or organizations: Not on file     Relationship status: Not on file    Intimate partner violence:     Fear of current or ex partner: Not on file     Emotionally abused: Not on file     Physically abused: Not on file     Forced sexual activity: Not on file   Other Topics Concern    Not on file   Social History Narrative    Not on file       The following sections were reviewed this encounter by the provider:   Tobacco   Allergies   Meds   Problems   Med Hx   Surg Hx   Fam Hx          Vitals:  BP 136/74 (BP Site: Right arm, Patient Position: Sitting, Cuff Size: Medium)    Pulse 79    Temp 98.2 F (36.8 C) (Oral)    Wt 113.9 kg (251 lb)    SpO2 97%    BMI 39.31 kg/m          ROS:  Review of Systems    Constitutional: Negative for chills, fatigue and fever.   Respiratory: Negative for cough, shortness of breath and wheezing.    Cardiovascular: Negative for chest pain and palpitations.   Gastrointestinal: Negative for abdominal pain, diarrhea, nausea and vomiting.   Musculoskeletal: Positive for back pain.   Neurological: Negative for weakness. Numbness: left hand.         Objective:       Physical Exam:  General Examination:   GENERAL APPEARANCE: alert, in no acute distress, well developed, well nourished, oriented to time, place, and person.   HEAD: normal appearance, atraumatic.   EYES: extraocular movement intact (EOMI), pupils equal, round, reactive to light and accommodation, sclera anicteric, conjunctiva clear.   ORAL CAVITY: normal oropharynx, normal lips, mucosa moist, no lesions.   THROAT: normal appearance, clear, no erythema.   NECK/THYROID: neck supple,  no cervical lymphadenopathy, no neck mass palpated, no thyromegaly.   LYMPH NODES: no palpable adenopathy.   SKIN: good turgor, no rashes, no suspicious lesions.   HEART: S1, S2 normal, no murmurs, rubs, gallops, regular rate and rhythm.   LUNGS: normal effort / no distress, normal breath sounds, clear to auscultation bilaterally, no wheezes, rales, rhonchi.   ABDOMEN: bowel sounds present, no hepatosplenomegaly, soft, nontender, nondistended.   MUSCULOSKELETAL: full range of motion, no swelling or deformity.   EXTREMITIES: no edema,  NEUROLOGIC: nonfocal, cranial nerves 2-12 grossly intact  PSYCH: cognitive function intact, mood/affect full range, speech clear.      Assessment/Plan:  1. Hematuria, unspecified type  - POCT UA Dipstix (10)(Multi-Test) neg except glucose  Pt states she is eating a lot, ate breakfast, lunch and then had a big bowl of ice cream  Advised pt to watch intake of carbs, U/A 2 weeks ago without glucose and glc 6 weeks ago was 82.    2. Acute bilateral low back pain without sciatica  F/u with PT and neurosurgeon  prn        Joen Laura, MD

## 2019-01-29 ENCOUNTER — Encounter (INDEPENDENT_AMBULATORY_CARE_PROVIDER_SITE_OTHER): Payer: Self-pay

## 2019-02-15 ENCOUNTER — Encounter (INDEPENDENT_AMBULATORY_CARE_PROVIDER_SITE_OTHER): Payer: Self-pay | Admitting: Internal Medicine

## 2019-02-28 ENCOUNTER — Encounter (INDEPENDENT_AMBULATORY_CARE_PROVIDER_SITE_OTHER): Payer: Self-pay

## 2019-03-03 ENCOUNTER — Other Ambulatory Visit: Payer: Self-pay | Admitting: Orthopaedic Surgery

## 2019-03-31 ENCOUNTER — Encounter (INDEPENDENT_AMBULATORY_CARE_PROVIDER_SITE_OTHER): Payer: Self-pay

## 2019-04-03 ENCOUNTER — Encounter (INDEPENDENT_AMBULATORY_CARE_PROVIDER_SITE_OTHER): Payer: Self-pay | Admitting: Internal Medicine

## 2019-04-30 ENCOUNTER — Encounter (INDEPENDENT_AMBULATORY_CARE_PROVIDER_SITE_OTHER): Payer: Self-pay

## 2019-05-02 ENCOUNTER — Encounter (INDEPENDENT_AMBULATORY_CARE_PROVIDER_SITE_OTHER): Payer: Self-pay | Admitting: Internal Medicine

## 2019-05-02 ENCOUNTER — Telehealth (INDEPENDENT_AMBULATORY_CARE_PROVIDER_SITE_OTHER): Payer: Self-pay | Admitting: Internal Medicine

## 2019-05-02 ENCOUNTER — Ambulatory Visit (INDEPENDENT_AMBULATORY_CARE_PROVIDER_SITE_OTHER): Payer: Medicare Other | Admitting: Internal Medicine

## 2019-05-02 ENCOUNTER — Other Ambulatory Visit (FREE_STANDING_LABORATORY_FACILITY): Payer: Medicare Other | Admitting: Internal Medicine

## 2019-05-02 VITALS — BP 121/79 | HR 80 | Temp 98.1°F | Wt 246.4 lb

## 2019-05-02 DIAGNOSIS — M791 Myalgia, unspecified site: Secondary | ICD-10-CM

## 2019-05-02 DIAGNOSIS — E782 Mixed hyperlipidemia: Secondary | ICD-10-CM

## 2019-05-02 DIAGNOSIS — M545 Low back pain, unspecified: Secondary | ICD-10-CM

## 2019-05-02 DIAGNOSIS — G8929 Other chronic pain: Secondary | ICD-10-CM

## 2019-05-02 DIAGNOSIS — I10 Essential (primary) hypertension: Secondary | ICD-10-CM

## 2019-05-02 MED ORDER — CYCLOBENZAPRINE HCL 10 MG PO TABS
10.00 mg | ORAL_TABLET | Freq: Every evening | ORAL | 0 refills | Status: AC | PRN
Start: 2019-05-02 — End: 2019-05-12

## 2019-05-02 MED ORDER — ATORVASTATIN CALCIUM 10 MG PO TABS
ORAL_TABLET | ORAL | 1 refills | Status: DC
Start: 2019-05-02 — End: 2019-06-19

## 2019-05-02 MED ORDER — LISINOPRIL 5 MG PO TABS
5.0000 mg | ORAL_TABLET | Freq: Every day | ORAL | 1 refills | Status: DC
Start: 2019-05-02 — End: 2019-06-19

## 2019-05-02 NOTE — Progress Notes (Signed)
Have you seen any specialists/other providers since your last visit with [place]?    Yes    Arm preference verified?   Yes    The patient is due for shingles vaccine, pneumonia vaccine and Medicare Wellness, Care Plan Letter, Advance Directive     Patient states that she had a nerve study done today.

## 2019-05-02 NOTE — Progress Notes (Signed)
Subjective:      Date: 05/02/2019 2:31 PM   Patient ID: Barbara Rubio is a 68 y.o. female.    Chief Complaint:  Chief Complaint   Patient presents with    Back Pain     Lower back, All ower body stiff. Requesting lab testing    Medication Refill       HPI:  HPI     -Complain of low back pain and muscle aches since last few weeks  Back pain is generalized throughout the back with no tingling or radiation or numbness  She wakes up with back pain and muscle stiffness  The symptoms are new for her and there has been no change in her lifestyle  It could be because of her old mattress  She had a CT scan recently to rule out kidney stone and it was all normal  She wants to get blood test for autoimmune to make sure to rule that out  She is fasting today and wants to get her blood work checked as well  Denies any hematuria or dysuria    HYPERTENSION  Visit type: routine follow-up of HTN  Type: essential  Associated Conditions: no evidence of CHF  Compliance: is compliant with the current regimen  Comorbid illness: none  Interval events: no interval events  Interval symptoms: none  Patient denies: dizziness, chest pain, shortness of breath, palpitations, pre-syncope, edema, DOE, orthopnea or PND  Average ambulatory systolic pressure: does not check ambulatory blood pressure  Average ambulatory diastolic pressure: does not check ambulatory blood pressure  End organ damage from HTN: none  Exercise: regular  Response to medications: has been good  Prior testing: a chemistry profile showed normal renal function  Additional concerns: In addition, pt also needs medication refill.    Pt has a history of hyperlipidemia and is compliant with anti-lipidemic therapy.    Problem List:  Patient Active Problem List   Diagnosis    Essential hypertension    Mixed hyperlipidemia    Pain in both hands    Pain in both feet    Arthralgia, unspecified joint    Chronic pain of both ankles       Current Medications:  Outpatient  Medications Marked as Taking for the 05/02/19 encounter (Office Visit) with Morrison Old, MD   Medication Sig Dispense Refill    atorvastatin (LIPITOR) 10 MG tablet TAKE 1 TABLET BY MOUTH EVERY DAY AT NIGHT 90 tablet 1    lisinopril (ZESTRIL) 5 MG tablet Take 1 tablet (5 mg total) by mouth daily 90 tablet 1    [DISCONTINUED] atorvastatin (LIPITOR) 10 MG tablet TAKE 1 TABLET BY MOUTH EVERY DAY AT NIGHT 90 tablet 1    [DISCONTINUED] lisinopril (PRINIVIL,ZESTRIL) 5 MG tablet Take 1 tablet (5 mg total) by mouth daily 90 tablet 1       Allergies:  No Known Allergies    Past Medical History:  Past Medical History:   Diagnosis Date    Hyperlipidemia     Hypertension     Left wrist fracture 2018       Past Surgical History:  Past Surgical History:   Procedure Laterality Date    HYSTERECTOMY       partial       Family History:  Family History   Problem Relation Age of Onset    Hypertension Mother     Hypertension Father     Hypertension Sister     Hypertension Brother  Social History:  Social History     Socioeconomic History    Marital status: Single     Spouse name: Not on file    Number of children: Not on file    Years of education: Not on file    Highest education level: Not on file   Occupational History    Not on file   Social Needs    Financial resource strain: Not on file    Food insecurity     Worry: Not on file     Inability: Not on file    Transportation needs     Medical: Not on file     Non-medical: Not on file   Tobacco Use    Smoking status: Never Smoker    Smokeless tobacco: Former Neurosurgeon   Substance and Sexual Activity    Alcohol use: Yes     Alcohol/week: 1.0 standard drinks     Types: 1 Glasses of wine per week     Comment:  once a month     Drug use: No    Sexual activity: Yes     Partners: Male     Birth control/protection: None   Lifestyle    Physical activity     Days per week: Not on file     Minutes per session: Not on file    Stress: Not on file   Relationships     Social connections     Talks on phone: Not on file     Gets together: Not on file     Attends religious service: Not on file     Active member of club or organization: Not on file     Attends meetings of clubs or organizations: Not on file     Relationship status: Not on file    Intimate partner violence     Fear of current or ex partner: Not on file     Emotionally abused: Not on file     Physically abused: Not on file     Forced sexual activity: Not on file   Other Topics Concern    Not on file   Social History Narrative    Not on file       The following sections were reviewed this encounter by the provider:   Tobacco   Allergies   Meds   Problems   Med Hx   Surg Hx   Fam Hx          Vitals:  BP 121/79 (BP Site: Left arm, Patient Position: Sitting, Cuff Size: Large)    Pulse 80    Temp 98.1 F (36.7 C) (Temporal)    Wt 111.8 kg (246 lb 6.4 oz)    SpO2 96%    BMI 38.59 kg/m       ROS:  General/Constitutional:   Denies Chills.  Denies Fever.   Ophthalmologic:   Denies Blurred vision.   ENT:   Denies Nasal Discharge. Denies Sinus pain. Denies Sore throat.   Respiratory:   Denies Cough. Denies Shortness of breath. Denies Wheezing.   Cardiovascular:   Denies Chest pain. Denies Chest pain with exertion. Denies Palpitations. Denies  Swelling in hands/feet.   Gastrointestinal:   Denies Abdominal pain. Denies Constipation. Denies Diarrhea. Denies Nausea. Denies  Vomiting.   Skin:   Denies Rash.   Neurologic:   Denies Dizziness. Denies Headache. Denies Tingling/Numbness.        Objective:       Physical Exam:  General Examination:   GENERAL APPEARANCE: alert, in no acute distress, well developed, well nourished,  oriented to time, place, and person.    NECK/THYROID: neck supple, no carotid bruit, carotid pulse 2+ bilaterally, no cervical  lymphadenopathy, no neck mass palpated, no jugular venous distention, no  thyromegaly.   HEART: S1, S2 normal, no murmurs, rubs, gallops, regular rate and rhythm.   LUNGS: normal  effort / no distress, normal breath sounds, clear to auscultation  bilaterally, no wheezes, rales, rhonchi.   EXTREMITIES: no clubbing, cyanosis, or edema bilaterally.   PERIPHERAL PULSES: 2+ dorsalis pedis, 2+ posterior tibial bilaterally.   NEUROLOGIC: alert and oriented. No spinal or paraspinal tenderness  Musculoskeletal: Strength in both lower extremity   Full range of motion present  Abd : No CVA tenderness, abdomen is soft         Assessment/Plan:       1. Chronic bilateral low back pain without sciatica  Most likely muscular  Advised stretching and weight loss  Warm compression  Advised trying to change the mattress  - ANA IFA w/rflx to Titer/Pat/Multiplex 11; Future  - cyclobenzaprine (FLEXERIL) 10 MG tablet; Take 1 tablet (10 mg total) by mouth nightly as needed for Muscle spasms  Dispense: 10 tablet; Refill: 0  Risk & Benefits of the new medication(s) were explained to the patient (and family) who verbalized understanding & agreed to the treatment plan. Patient (family) encouraged to contact me/clinical staff with any questions/concerns      2. Mixed hyperlipidemia  - atorvastatin (LIPITOR) 10 MG tablet; TAKE 1 TABLET BY MOUTH EVERY DAY AT NIGHT  Dispense: 90 tablet; Refill: 1  - Comprehensive metabolic panel  - Lipid panel    3. Essential hypertension  - lisinopril (ZESTRIL) 5 MG tablet; Take 1 tablet (5 mg total) by mouth daily  Dispense: 90 tablet; Refill: 1  - Comprehensive metabolic panel    4. Muscle ache  - ANA IFA w/rflx to Titer/Pat/Multiplex 11; Future          Morrison Old, MD

## 2019-05-02 NOTE — Telephone Encounter (Signed)
Order done . Please fax.thanks

## 2019-05-02 NOTE — Telephone Encounter (Signed)
Hi Dr. Eben Burow,     Patient is requesting a referral for a chiropractor for her lower back pain.    I will mail it to the patient once the order is placed.    Thank you!

## 2019-05-03 LAB — LIPID PANEL
Cholesterol / HDL Ratio: 3.5
Cholesterol: 215 mg/dL — ABNORMAL HIGH (ref 0–199)
HDL: 61 mg/dL (ref 40–9999)
LDL Calculated: 136 mg/dL — ABNORMAL HIGH (ref 0–99)
Triglycerides: 91 mg/dL (ref 34–149)
VLDL Calculated: 18 mg/dL (ref 10–40)

## 2019-05-03 LAB — COMPREHENSIVE METABOLIC PANEL
ALT: 23 U/L (ref 0–55)
AST (SGOT): 27 U/L (ref 5–34)
Albumin/Globulin Ratio: 1.2 (ref 0.9–2.2)
Albumin: 3.9 g/dL (ref 3.5–5.0)
Alkaline Phosphatase: 93 U/L (ref 37–106)
Anion Gap: 9 (ref 5.0–15.0)
BUN: 8 mg/dL (ref 7.0–19.0)
Bilirubin, Total: 0.6 mg/dL (ref 0.2–1.2)
CO2: 26 mEq/L (ref 21–29)
Calcium: 9.6 mg/dL (ref 8.5–10.5)
Chloride: 106 mEq/L (ref 100–111)
Creatinine: 0.9 mg/dL (ref 0.4–1.5)
Globulin: 3.3 g/dL (ref 2.0–3.7)
Glucose: 84 mg/dL (ref 70–100)
Potassium: 4.4 mEq/L (ref 3.5–5.1)
Protein, Total: 7.2 g/dL (ref 6.0–8.3)
Sodium: 141 mEq/L (ref 136–145)

## 2019-05-03 LAB — HEMOLYSIS INDEX: Hemolysis Index: 4 (ref 0–18)

## 2019-05-03 LAB — GFR: EGFR: >60

## 2019-05-05 LAB — ANA IFA W/REFLEX TO TITER/PATTERN/AB: ANA Screen, IFA: NEGATIVE

## 2019-05-05 NOTE — Telephone Encounter (Signed)
Mailed

## 2019-05-13 ENCOUNTER — Encounter (INDEPENDENT_AMBULATORY_CARE_PROVIDER_SITE_OTHER): Payer: Self-pay | Admitting: Internal Medicine

## 2019-05-13 ENCOUNTER — Telehealth (INDEPENDENT_AMBULATORY_CARE_PROVIDER_SITE_OTHER): Payer: Self-pay | Admitting: Internal Medicine

## 2019-05-13 DIAGNOSIS — M2550 Pain in unspecified joint: Secondary | ICD-10-CM

## 2019-05-13 NOTE — Telephone Encounter (Signed)
Pt called back stating she keeps getting calls from [place] and is unaware if it is in regards to this. Pt can be reached at [phone](VM ok if we miss her). Please advise, thank you!

## 2019-05-13 NOTE — Telephone Encounter (Signed)
Hi Dr. Eben Burow,     Patient is requesting a referral to Rheumatology (Dr. Clayborn Bigness at the clinical center of [facility]) for her rheumatoid arthritis.    Address: [address] [place], [place]  Phone: [phone]  Fax: [phone]    Thank you.

## 2019-05-13 NOTE — Telephone Encounter (Signed)
Order done . Please fax.thanks

## 2019-05-13 NOTE — Telephone Encounter (Signed)
Notified patient and faxed

## 2019-05-29 ENCOUNTER — Telehealth (INDEPENDENT_AMBULATORY_CARE_PROVIDER_SITE_OTHER): Payer: Medicare Other | Admitting: Internal Medicine

## 2019-05-29 ENCOUNTER — Encounter (INDEPENDENT_AMBULATORY_CARE_PROVIDER_SITE_OTHER): Payer: Self-pay | Admitting: Internal Medicine

## 2019-05-29 DIAGNOSIS — G8929 Other chronic pain: Secondary | ICD-10-CM

## 2019-05-29 DIAGNOSIS — M25562 Pain in left knee: Secondary | ICD-10-CM

## 2019-05-29 NOTE — Progress Notes (Signed)
Have you seen any specialists/other providers since your last visit with [place]?    Yes  Rheumatologists    Arm preference verified?   Yes    The patient is due for influenza vaccine, shingles vaccine, pneumonia vaccine and Advance Directive, Care Plan Letter, Medicare Wellness

## 2019-05-29 NOTE — Progress Notes (Signed)
Subjective:      Date: 05/29/2019 8:42 AM   Patient ID: Barbara Rubio is a 68 y.o. female.    This visit is being conducted via video.  Verbal consent has been obtained from the patient to conduct a telephone visit encounter to minimize exposure to COVID-19: yes     Chief Complaint:  Chief Complaint   Patient presents with    Arthritis     Left hand and left upper thigh       HPI:  HPI     Here for f/u :    Saw chiropractor   Has been seeing him for 3 weeks   Low back pain better     Now left knee , thigh pain  Undergoing PT   Saw Rheum : Dr Tobi Bastos  Did a televisit with her   She was advised that she has most likely arthritis   Patient has forwarded all her Xrays   Follow up with her in 2 weeks        Problem List:  Patient Active Problem List   Diagnosis    Essential hypertension    Mixed hyperlipidemia    Pain in both hands    Pain in both feet    Arthralgia, unspecified joint    Chronic pain of both ankles       Current Medications:  Outpatient Medications Marked as Taking for the 05/29/19 encounter (Telemedicine Visit) with Morrison Old, MD   Medication Sig Dispense Refill    atorvastatin (LIPITOR) 10 MG tablet TAKE 1 TABLET BY MOUTH EVERY DAY AT NIGHT 90 tablet 1    lisinopril (ZESTRIL) 5 MG tablet Take 1 tablet (5 mg total) by mouth daily 90 tablet 1       Allergies:  No Known Allergies    Past Medical History:  Past Medical History:   Diagnosis Date    Hyperlipidemia     Hypertension     Left wrist fracture 2018       Past Surgical History:  Past Surgical History:   Procedure Laterality Date    HYSTERECTOMY       partial       Family History:  Family History   Problem Relation Age of Onset    Hypertension Mother     Hypertension Father     Hypertension Sister     Hypertension Brother        Social History:  Social History     Tobacco Use    Smoking status: Never Smoker    Smokeless tobacco: Former Neurosurgeon   Substance Use Topics    Alcohol use: Yes     Alcohol/week: 1.0 standard drinks      Types: 1 Glasses of wine per week     Comment:  once a month     Drug use: No         The following sections were reviewed this encounter by the provider:   Tobacco   Allergies   Meds   Problems   Med Hx   Surg Hx   Fam Hx            ROS:  Review of Systems   General/Constitutional:   Denies Chills. Denies Fever.   Ophthalmologic:   Denies Blurred vision.   ENT:   Denies Nasal Discharge. Denies Sinus pain. Denies Sore throat.   Respiratory:   Denies Cough. Denies Shortness of breath. Denies Wheezing.   Cardiovascular:   Denies Chest pain. Denies Chest pain  with exertion. Denies Palpitations. Denies  Swelling in hands/feet.   Gastrointestinal:   Denies Abdominal pain. Denies Constipation. Denies Diarrhea. Denies Nausea. Denies  Vomiting.   Skin:   Denies Rash.   Neurologic:   Denies Dizziness. Denies Headache. Denies Tingling/Numbness.     Objective:   Vitals:  There were no vitals taken for this visit.      Physical Exam:  General Examination:   Physical Exam   GENERAL APPEARANCE: alert, in no acute distress, appearing at baseline  LUNGS: normal effort   NEUROLOGIC: alert and oriented.   Psych : mood , thought process appears normal, maintains good eye contact   Assessment / Plan       1. Chronic pain of left knee  following with Rheum   F/u in 2 weeks after xray   Antiinflammatory   Knee brace  Stretching   Advised to see Ortho if symptoms not improve or worsens     Time spent in discussion: 11 to 20 minutes     Follow-up:   No follow-ups on file.       Morrison Old, MD

## 2019-05-30 ENCOUNTER — Other Ambulatory Visit: Payer: Self-pay | Admitting: Rheumatology

## 2019-05-30 ENCOUNTER — Other Ambulatory Visit (FREE_STANDING_LABORATORY_FACILITY): Payer: Medicare Other

## 2019-05-30 DIAGNOSIS — M130 Polyarthritis, unspecified: Secondary | ICD-10-CM

## 2019-05-30 LAB — HEMOLYSIS INDEX: Hemolysis Index: 3 (ref 0–18)

## 2019-05-30 LAB — CBC AND DIFFERENTIAL
Absolute NRBC: 0 10*3/uL (ref 0.00–0.00)
Basophils Absolute Automated: 0.04 10*3/uL (ref 0.00–0.08)
Basophils Automated: 0.7 %
Eosinophils Absolute Automated: 0.08 10*3/uL (ref 0.00–0.44)
Eosinophils Automated: 1.5 %
Hematocrit: 41.2 % (ref 34.7–43.7)
Hgb: 12.4 g/dL (ref 11.4–14.8)
Immature Granulocytes Absolute: 0.01 10*3/uL (ref 0.00–0.07)
Immature Granulocytes: 0.2 %
Lymphocytes Absolute Automated: 2.41 10*3/uL (ref 0.42–3.22)
Lymphocytes Automated: 44.5 %
MCH: 26.4 pg (ref 25.1–33.5)
MCHC: 30.1 g/dL — ABNORMAL LOW (ref 31.5–35.8)
MCV: 87.7 fL (ref 78.0–96.0)
MPV: 13.4 fL — ABNORMAL HIGH (ref 8.9–12.5)
Monocytes Absolute Automated: 0.4 10*3/uL (ref 0.21–0.85)
Monocytes: 7.4 %
Neutrophils Absolute: 2.47 10*3/uL (ref 1.10–6.33)
Neutrophils: 45.7 %
Nucleated RBC: 0 /100 WBC (ref 0.0–0.0)
Platelets: 188 10*3/uL (ref 142–346)
RBC: 4.7 10*6/uL (ref 3.90–5.10)
RDW: 14 % (ref 11–15)
WBC: 5.41 10*3/uL (ref 3.10–9.50)

## 2019-05-30 LAB — C-REACTIVE PROTEIN: C-Reactive Protein: 0.6 mg/dL (ref 0.0–0.8)

## 2019-05-30 LAB — RHEUMATOID FACTOR: Rheumatoid Factor: <15 (ref 0.0–30.0)

## 2019-05-30 LAB — URIC ACID: Uric acid: 5 mg/dL (ref 2.6–6.0)

## 2019-05-31 ENCOUNTER — Encounter (INDEPENDENT_AMBULATORY_CARE_PROVIDER_SITE_OTHER): Payer: Self-pay

## 2019-05-31 LAB — SEDIMENTATION RATE: Sed Rate: 15 mm/Hr (ref 0–20)

## 2019-05-31 LAB — LYME AB, TOTAL,REFLEX TO WESTERN BLOT (IGG & IGM): Lyme AB,Total,Reflx to WB(IGM): 0.25

## 2019-05-31 LAB — HEPATITIS PANEL, ACUTE
Hep A IgM: NONREACTIVE
Hepatitis B Core IgM: NONREACTIVE
Hepatitis B Surface Antigen: NONREACTIVE
Hepatitis C, AB: NONREACTIVE

## 2019-06-02 LAB — QUANTIFERON(R)-TB GOLD PLUS
Mitogen-NIL: 6.85
NIL: 0.03
Quantiferon TB Gold Plus: NEGATIVE
TB1-NIL: 0.02
TB2-NIL: 0

## 2019-06-02 LAB — CYCLIC CITRUL PEPTIDE ANTIBODY, IGG: Cyclic Citrullinated Peptide AB: <15.6 U

## 2019-06-03 ENCOUNTER — Encounter (INDEPENDENT_AMBULATORY_CARE_PROVIDER_SITE_OTHER): Payer: Self-pay | Admitting: Internal Medicine

## 2019-06-03 LAB — HLA-B27 ANTIGEN: HLA B27: NEGATIVE

## 2019-06-03 LAB — CASCADING REFLEX FOR ANAQQ
Chromatin (Nucleosomal) Antibody: <1
DNA (ds) Double Stranded Antibody: 1 (ref <=4)
RNP Antibody: 2.9 — ABNORMAL HIGH
Sm Antibody: <1
Sm/RNP Antibody: <1

## 2019-06-03 LAB — REFLEX ANA CASCADING INTERPRETATION

## 2019-06-03 LAB — ANA TITER AND PATTERN FOR QUEST: ANA Titer: 1:40 {titer} — AB

## 2019-06-03 LAB — ANA IFA W/REFLEX TO TITER/PATTERN/AB: ANA Screen, IFA: POSITIVE — AB

## 2019-06-03 LAB — G-6-PD, RBC: RBC G6PD: 10.5 (ref 8.8–13.4)

## 2019-06-13 ENCOUNTER — Ambulatory Visit (INDEPENDENT_AMBULATORY_CARE_PROVIDER_SITE_OTHER): Payer: Medicare Other | Admitting: Internal Medicine

## 2019-06-19 ENCOUNTER — Encounter (INDEPENDENT_AMBULATORY_CARE_PROVIDER_SITE_OTHER): Payer: Self-pay | Admitting: Internal Medicine

## 2019-06-19 ENCOUNTER — Ambulatory Visit (INDEPENDENT_AMBULATORY_CARE_PROVIDER_SITE_OTHER): Payer: Medicare Other | Admitting: Internal Medicine

## 2019-06-19 VITALS — BP 119/79 | HR 74 | Temp 98.1°F | Ht 66.0 in | Wt 242.0 lb

## 2019-06-19 DIAGNOSIS — M2550 Pain in unspecified joint: Secondary | ICD-10-CM

## 2019-06-19 DIAGNOSIS — Z0000 Encounter for general adult medical examination without abnormal findings: Secondary | ICD-10-CM

## 2019-06-19 DIAGNOSIS — I10 Essential (primary) hypertension: Secondary | ICD-10-CM

## 2019-06-19 DIAGNOSIS — E782 Mixed hyperlipidemia: Secondary | ICD-10-CM

## 2019-06-19 DIAGNOSIS — Z87891 Personal history of nicotine dependence: Secondary | ICD-10-CM

## 2019-06-19 MED ORDER — LISINOPRIL 5 MG PO TABS
5.0000 mg | ORAL_TABLET | Freq: Every day | ORAL | 1 refills | Status: DC
Start: 2019-06-19 — End: 2019-12-26

## 2019-06-19 MED ORDER — ATORVASTATIN CALCIUM 10 MG PO TABS
ORAL_TABLET | ORAL | 1 refills | Status: DC
Start: 2019-06-19 — End: 2020-01-15

## 2019-06-19 NOTE — Patient Instructions (Signed)
Womens Preventive Wellness Plan  Todays Date: June 19, 2019    Patient Name:Barbara Rubio    Date of Birth: 14-Jun-1951     As part of your wellness benefit, Medicare makes many screening tests available to you at no charge.  A complete list of these tests can be found at their website, [email or web address]. However, many of these tests or recommendations are out of date, or may not apply to you. After careful consideration of your own personal health needs, the following testing is recommended for you:      Preventive Service    Up-to-date (UTD)/Due/Not Applicable (N/A)   Last Done   Medicare Frequency   Body Mass Index   Up-to-date June 19, 2019  (BMI):Body mass index is 39.06 kg/m.   Height:Height: 167.6 cm (5\' 6" )  Weight:Weight: 109.8 kg (242 lb)  Annually   Blood Pressure: Up-to-date June 19, 2019    BP: 119/79    Every 2 yrs, if BP </= 120/80 mm hg   Annually, if BP >120-139/80-89 mm hg   Cholesterol Testing Up-to-date Lab Results   Component Value Date    LDL 136 (H) 05/02/2019      Regularly beginning at age 63 with risk factors   Diabetes Screening Up-to-date Lab Results   Component Value Date    GLU 84 05/02/2019       If prediabetes, one screening every 6 months   Otherwise, one screening every 12 months with certain risk factors for diabetes   Osteoporosis Screening   (Bone Density Measurement)  Up-to-date   Routinely, for women aged 65+   Routinely, for women aged 60-64 with risk factors   Breast Cancer Screening   (Mammogram) Up-to-date   Every 2 yrs, aged 17-74 yrs   Cervical Cancer Screening   (Pap Smear)  Not applicable   Annually if at high risk for developing cervical or vaginal cancer or childbearing age with abnormal Pap test within past 3 years; Every 2 years for women at normal risk   HPV: Once every 5 years; All asymptomatic female Medicare beneficiaries aged 44 to 7 years   Colorectal Cancer Screening  Up-to-date   Annually, Fecal Occult Blood Stool (FOBS)   Every 5 yrs, Sigmoidoscopy with FOBS   Every 10 yrs, Colonoscopy   Every 3 yrs, Cologuard   Depression Screening Up-to-date June 19, 2019   As necessary for those with risk factors   Sexually Transmitted Diseases (STDs) & HIV Screening Not applicable   As necessary for those with risk factors   Alcohol Misuse Screening Up-to-date   As necessary for those with risk factors   Immunizations:   Shingrix is due Immunization History   Administered Date(s) Administered    Tdap 07/30/2018     Prevnar 13: 1 dose after age 29   Pneumovax 76: 1 dose 1 year after Prevnar   Influenza: Annually   Advance Directive 5 wishes form given to patient   Once; update as needed   Medical Nutrition Therapy Not applicable   As necessary for diabetes or renal disease   Smoking Cessation Counseling Not applicable Counseling given: Not Answered    Frequency: two cessation attempts per year.   Glaucoma Screening Up-to-date   Annually for covered high risk Medicare beneficiaries (one of the following: DM, FHx Glaucoma, African-Americans aged 46+, Hispanic-Americans aged 65+)   Hepatitis C Virus (HCV) Screening Up-to-date   Annually only for high risk behavior   Once if born between [birth year] and 1964 and are  not considered high risk   Lung Cancer Screening Not applicable   Annually if asymptomatic, tobacco smoking history of at least 30 pack-years (one pack-year = smoking one pack per day for one year; 1 pack = 20 cigarettes), and current smoker or one who has quit smoking within the last 15 years     Your major risk factors:       Hypertension and Falls Risk     Recommendations for improvement:    Low cholesterol diet, Exercise, Weight management, Compliance with prescription medications and Weight bearing exercises     Referrals:    See After Visit Summary orders

## 2019-06-19 NOTE — Progress Notes (Signed)
Barbara Rubio is a 68 y.o. female who presents today for a Medicare Annual Wellness Visit.     Health Risk Assessment     During the past month, how would you rate your general health?:  Good  Which of the following tasks can you do without assistance - drive or take the bus alone; shop for groceries or clothes; prepare your own meals; do your own housework/laundry; handle your own finances/pay bills; eat, bathe or get around your home?:  Drive or take the bus alone, Eat, bathe, dress or get around your home, Handle your own finances/pay bills, Do your own housework/laundry  Which of the following problems have you been bothered by in the past month - dizzy when standing up; problems using the phone; feeling tired or fatigued; moderate or severe body pain?: None of these  Do you exercise for about 20 minutes 3 or more days per week?:  No  During the past month was someone available to help if you needed and wanted help?  For example, if you felt nervous, lonely, got sick and had to stay in bed, needed someone to talk to, needed help with daily chores or needed help just taking care of yourself.: No  Do you always wear a seat belt?: Yes  Do you have any trouble taking medications the way you have been told to take them?: No  Have you been given any information that can help you with keeping track of your medications?: Yes  Do you have trouble paying for your medications?: No  Have you been given any information that can help you with hazards in your house, such as scatter rugs, furniture, etc?: Yes  Do you feel unsteady when standing or walking?: No  Do you worry about falling?: No  Have you fallen two or more times in the past year?: No  Did you suffer any injuries from your falls in the past year?: No    Additional Concerns  Being followed by rheumatologist for extensive muscle and joint pain   ANA +, RNP +  F/u appt next week  Being followed by chiropractor 2/week -which is helping her a lot    Patient Care  Team:  Joen Laura, MD as PCP - General (Internal Medicine)    Past Medical History:   Diagnosis Date    Hyperlipidemia     Hypertension     Left wrist fracture 2018     Past Surgical History:   Procedure Laterality Date    HYSTERECTOMY       partial     No Known Allergies   Current Outpatient Medications   Medication Sig Dispense Refill    atorvastatin (LIPITOR) 10 MG tablet TAKE 1 TABLET BY MOUTH EVERY DAY AT NIGHT 90 tablet 1    lisinopril (ZESTRIL) 5 MG tablet Take 1 tablet (5 mg total) by mouth daily 90 tablet 1     No current facility-administered medications for this visit.       Social History     Tobacco Use    Smoking status: Never Smoker    Smokeless tobacco: Former Neurosurgeon   Substance Use Topics    Alcohol use: Yes     Alcohol/week: 1.0 standard drinks     Types: 1 Glasses of wine per week     Comment:  once a month     Drug use: No      Family History   Problem Relation Age of Onset    Hypertension  Mother     Hypertension Father     Hypertension Sister     Hypertension Brother         The following sections were reviewed this encounter by the provider:   Tobacco   Allergies   Meds   Problems   Med Hx   Surg Hx   Fam Hx          Hospitalizations  no hospitalizations within past 6 months and no hospitalizations within past year    Depression Screening    See related Activity or Flowsheet    Functional Ability    Falls Risk:  home does not have throw rugs, poor lighting or a slippery bath tub or shower  Hearing:  hearing within normal limits  Exercise:  Light ( i.e. stretching or slow walking ), exercises 1-2x/week, exercises 3-4x/week and walking  ADL's:   Bathing - independent   Dressing - independent   Mobility - independent   Transfer - independent   Eating - independent}   Toileting - independent   ADL assistance not needed    Discussion of Advance Directives: Has an Advanced Directive. A copy has not been provided. Requested to provide.     Assessment      BP 119/79 (BP Site: Right arm,  Patient Position: Sitting, Cuff Size: Large)    Pulse 74    Temp 98.1 F (36.7 C) (Temporal)    Ht 1.676 m (5\' 6" )    Wt 109.8 kg (242 lb)    SpO2 97%    BMI 39.06 kg/m      Vision Screening (required for IPPE only): Corrected:     L 20/25     R 20/30  Screening EKG (IPPE only): not indicated    GENERAL APPEARANCE: alert, in no acute distress, well developed, well nourished      oriented to time, place, and person.   EYES: PERRL, EOMI  ORAL CAVITY: normal oropharynx, normal lips, mucosa moist. Cobblestoning - clear PND  NECK/THYROID: neck supple, no carotid bruit, carotid pulse 2+ bilaterally, no neck mass palpated, no jugular venous distention, no thyromegaly.  LYMPH: no cervical/supraclavicular adenopathy   HEART: S1, S2 normal, no murmurs, rubs, gallops, regular rate and rhythm.   LUNGS: normal effort / no distress, normal breath sounds, clear to auscultation    bilaterally, no wheezes, rales, rhonchi.   EXTREMITIES: No LE edema bilaterally.   PERIPHERAL PULSES:  peripheral pulses normal, no pedal edema, no clubbing or cyanosis  PSYCH: alert and oriented to time,place and person.   SKIN: moist, no focal rash  NEURO: no focal deficit           Evaluation of Cognitive Function    Mood/affect: Appropriate  Appearance: neatly groomed, appropriately and adequately nourished  Family member/caregiver input: Not present        AWV Mini-Cog Result:  > 3 points - negative screen for dementia    Assessment/Plan    1. Routine general medical examination at a health care facility  Preventative maintenance  Recommend annual flu vaccine, Tdap every 10 years, regular breast exams, sunscreen, seatbelts, no texting while driving.  Health promotion and disease prevention discussed. Exercise and diet discussed. Optimize aerobic exercise per ACC guidelines 150 min per week. Aim for 2-3 L of water per day. Dietary fiber 25 grams per day. Optimize weight. Daily calcium and vitamin D.      Fall prevention discussed    2. Mixed  hyperlipidemia  - atorvastatin (LIPITOR) 10  MG tablet; TAKE 1 TABLET BY MOUTH EVERY DAY AT NIGHT  Dispense: 90 tablet; Refill: 1  Pt stable on current medication regimen, no concerns. Continue current medication    3. Essential hypertension  - lisinopril (ZESTRIL) 5 MG tablet; Take 1 tablet (5 mg total) by mouth daily  Dispense: 90 tablet; Refill: 1  Pt stable on current medication regimen, no concerns. Continue current medication    4. 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

## 2019-07-01 ENCOUNTER — Encounter (INDEPENDENT_AMBULATORY_CARE_PROVIDER_SITE_OTHER): Payer: Self-pay

## 2019-07-21 ENCOUNTER — Other Ambulatory Visit (FREE_STANDING_LABORATORY_FACILITY): Payer: Medicare Other

## 2019-07-21 DIAGNOSIS — L4050 Arthropathic psoriasis, unspecified: Secondary | ICD-10-CM

## 2019-07-21 LAB — URINALYSIS REFLEX TO MICROSCOPIC EXAM - REFLEX TO CULTURE
Bilirubin, UA: NEGATIVE
Blood, UA: NEGATIVE
Glucose, UA: NEGATIVE
Leukocyte Esterase, UA: NEGATIVE
Nitrite, UA: NEGATIVE
Protein, UR: NEGATIVE
Specific Gravity UA: 1.028 (ref 1.001–1.035)
Urine Bacteria: NONE SEEN /hpf
Urine pH: 6 (ref 5.0–8.0)
Urobilinogen, UA: 1 (ref 0.2–2.0)

## 2019-07-21 LAB — CBC AND DIFFERENTIAL
Absolute NRBC: 0 10*3/uL (ref 0.00–0.00)
Basophils Absolute Automated: 0.03 10*3/uL (ref 0.00–0.08)
Basophils Automated: 0.5 %
Eosinophils Absolute Automated: 0.08 10*3/uL (ref 0.00–0.44)
Eosinophils Automated: 1.4 %
Hematocrit: 39.4 % (ref 34.7–43.7)
Hgb: 12.1 g/dL (ref 11.4–14.8)
Immature Granulocytes Absolute: 0.01 10*3/uL (ref 0.00–0.07)
Immature Granulocytes: 0.2 %
Lymphocytes Absolute Automated: 2.18 10*3/uL (ref 0.42–3.22)
Lymphocytes Automated: 38.5 %
MCH: 26.8 pg (ref 25.1–33.5)
MCHC: 30.7 g/dL — ABNORMAL LOW (ref 31.5–35.8)
MCV: 87.4 fL (ref 78.0–96.0)
MPV: 13.3 fL — ABNORMAL HIGH (ref 8.9–12.5)
Monocytes Absolute Automated: 0.27 10*3/uL (ref 0.21–0.85)
Monocytes: 4.8 %
Neutrophils Absolute: 3.09 10*3/uL (ref 1.10–6.33)
Neutrophils: 54.6 %
Nucleated RBC: 0 /100 WBC (ref 0.0–0.0)
Platelets: 176 10*3/uL (ref 142–346)
RBC: 4.51 10*6/uL (ref 3.90–5.10)
RDW: 14 % (ref 11–15)
WBC: 5.66 10*3/uL (ref 3.10–9.50)

## 2019-07-21 LAB — GFR: EGFR: >60

## 2019-07-21 LAB — CK: Creatine Kinase (CK): 96 U/L (ref 29–168)

## 2019-07-21 LAB — HEMOLYSIS INDEX: Hemolysis Index: 3 (ref 0–18)

## 2019-07-21 LAB — C4 COMPLEMENT: C4 Complement: 43.4 mg/dL (ref 15.0–57.0)

## 2019-07-21 LAB — AST: AST (SGOT): 14 U/L (ref 5–34)

## 2019-07-21 LAB — C3 COMPLEMENT: C3 Complement: 170 mg/dL (ref 83–193)

## 2019-07-21 LAB — SEDIMENTATION RATE: Sed Rate: 18 mm/Hr (ref 0–20)

## 2019-07-21 LAB — ALT: ALT: 12 U/L (ref 0–55)

## 2019-07-21 LAB — CREATININE, SERUM: Creatinine: 0.9 mg/dL (ref 0.4–1.5)

## 2019-07-22 LAB — ANTI-DNA ANTIBODY, DOUBLE-STRANDED
Anti-DNA (DS) Antibody Quantitative: 7 (ref 0–99)
dsDNA Interpretation: NEGATIVE

## 2019-07-23 ENCOUNTER — Encounter (INDEPENDENT_AMBULATORY_CARE_PROVIDER_SITE_OTHER): Payer: Self-pay | Admitting: Internal Medicine

## 2019-07-29 ENCOUNTER — Telehealth (INDEPENDENT_AMBULATORY_CARE_PROVIDER_SITE_OTHER): Payer: Self-pay | Admitting: Internal Medicine

## 2019-07-29 NOTE — Telephone Encounter (Signed)
Pt needs the all lab results of 07/21/2019 send to Dr. Clayborn Bigness fax: [phone] thanks! Fax over asap pt Is unable to get meds refilled until these results have been sent thanks!

## 2019-07-31 ENCOUNTER — Encounter (INDEPENDENT_AMBULATORY_CARE_PROVIDER_SITE_OTHER): Payer: Self-pay

## 2019-07-31 NOTE — Telephone Encounter (Signed)
Called and left VM to call back.

## 2019-08-15 ENCOUNTER — Encounter (INDEPENDENT_AMBULATORY_CARE_PROVIDER_SITE_OTHER): Payer: Self-pay | Admitting: Internal Medicine

## 2019-08-31 ENCOUNTER — Encounter (INDEPENDENT_AMBULATORY_CARE_PROVIDER_SITE_OTHER): Payer: Self-pay

## 2019-09-16 ENCOUNTER — Encounter (INDEPENDENT_AMBULATORY_CARE_PROVIDER_SITE_OTHER): Payer: Self-pay

## 2019-09-23 ENCOUNTER — Telehealth (INDEPENDENT_AMBULATORY_CARE_PROVIDER_SITE_OTHER): Payer: Medicare Other | Admitting: Internal Medicine

## 2019-09-23 ENCOUNTER — Encounter (INDEPENDENT_AMBULATORY_CARE_PROVIDER_SITE_OTHER): Payer: Self-pay | Admitting: Internal Medicine

## 2019-09-23 ENCOUNTER — Encounter (INDEPENDENT_AMBULATORY_CARE_PROVIDER_SITE_OTHER): Payer: Medicare Other | Admitting: Internal Medicine

## 2019-09-23 ENCOUNTER — Telehealth (INDEPENDENT_AMBULATORY_CARE_PROVIDER_SITE_OTHER): Payer: Self-pay | Admitting: Internal Medicine

## 2019-09-23 DIAGNOSIS — E782 Mixed hyperlipidemia: Secondary | ICD-10-CM

## 2019-09-23 DIAGNOSIS — I10 Essential (primary) hypertension: Secondary | ICD-10-CM

## 2019-09-23 DIAGNOSIS — L4050 Arthropathic psoriasis, unspecified: Secondary | ICD-10-CM

## 2019-09-23 NOTE — Progress Notes (Signed)
Have you seen any specialists/other providers since your last visit with [place]?    No    Arm preference verified?   Yes    The patient is due for mammogram, depression screening, shingles vaccine and Advance Directive, Care plan letter

## 2019-09-23 NOTE — Progress Notes (Signed)
Subjective:      Date: 09/23/2019 1:50 PM   Patient ID: Barbara Rubio is a 68 y.o. female.    This visit is being conducted via video.  Verbal consent has been obtained from the patient to conduct a telephone visit encounter to minimize exposure to COVID-19: yes     Chief Complaint:  Chief Complaint   Patient presents with    Hypertension     Follow up    Urinary Frequency       HPI:  HPI     Here for f/u :    HYPERTENSION  Visit type: routine follow-up of HTN  Type: essential  Associated Conditions: no evidence of CHF  Compliance: is compliant with the current regimen  Comorbid illness: none  Interval events: no interval events  Interval symptoms: none  Patient denies: dizziness, chest pain, shortness of breath, palpitations, pre-syncope, edema, DOE, orthopnea or PND  Average ambulatory systolic pressure: does not check ambulatory blood pressure  Average ambulatory diastolic pressure: does not check ambulatory blood pressure  End organ damage from HTN: none  Exercise: regular  Response to medications: has been good  Prior testing: a chemistry profile showed normal renal function  Additional concerns: In addition, pt also needs medication refill.    Lab Results   Component Value Date    NA 141 05/02/2019    K 4.4 05/02/2019    CL 106 05/02/2019    CO2 26 05/02/2019    GLU 84 05/02/2019    BUN 8.0 05/02/2019    CREAT 0.9 07/21/2019    CA 9.6 05/02/2019    PROT 7.2 05/02/2019    ALB 3.9 05/02/2019    GLOB 3.3 05/02/2019    AGRATIO 1.2 05/02/2019    BILITOTAL 0.6 05/02/2019    AST 14 07/21/2019    ALT 12 07/21/2019    ALKPHOS 93 05/02/2019    EGFR >60.0 07/21/2019        Hyperlipidemia   This is a chronic problem. The problem is controlled. Pertinent negatives include no chest pain, leg pain or myalgias. Current antihyperlipidemic treatment includes statins. There are no compliance problems.  Risk factors for coronary artery disease include hypertension.       Lab Results   Component Value Date    CHOL 215  (H) 05/02/2019    CHOL 220 (H) 12/13/2018    CHOL 199 05/31/2018     Lab Results   Component Value Date    HDL 61 05/02/2019    HDL 68 12/13/2018    HDL 56 05/31/2018     Lab Results   Component Value Date    LDL 136 (H) 05/02/2019    LDL 134 (H) 12/13/2018    LDL 120 (H) 05/31/2018     Lab Results   Component Value Date    TRIG 91 05/02/2019    TRIG 91 12/13/2018    TRIG 117 05/31/2018     - Rheumatologist : Dr Cresenciano Lick  On methotrexate and FA  Currently on Prednisone   Will f/u in 2 months again with the rheumatologist  Joint pain is much better    Problem List:  Patient Active Problem List   Diagnosis    Essential hypertension    Mixed hyperlipidemia    Pain in both hands    Pain in both feet    Arthralgia, unspecified joint    Chronic pain of both ankles       Current Medications:  Outpatient Medications Marked as  Taking for the 09/23/19 encounter (Telemedicine Visit) with Morrison Old, MD   Medication Sig Dispense Refill    atorvastatin (LIPITOR) 10 MG tablet TAKE 1 TABLET BY MOUTH EVERY DAY AT NIGHT 90 tablet 1    folic acid (FOLVITE) 1 MG tablet Take 1 mg by mouth daily      lisinopril (ZESTRIL) 5 MG tablet Take 1 tablet (5 mg total) by mouth daily 90 tablet 1    methotrexate 2.5 MG tablet 2.5 mg once a week 8 tabs weekly        predniSONE (DELTASONE) 5 MG tablet Take 10 mg by mouth daily Patient take (2) 5 mg tabs daily         Allergies:  No Known Allergies    Past Medical History:  Past Medical History:   Diagnosis Date    Hyperlipidemia     Hypertension     Left wrist fracture 2018       Past Surgical History:  Past Surgical History:   Procedure Laterality Date    HYSTERECTOMY       partial       Family History:  Family History   Problem Relation Age of Onset    Hypertension Mother     Hypertension Father     Hypertension Sister     Hypertension Brother        Social History:  Social History     Tobacco Use    Smoking status: Never Smoker    Smokeless tobacco: Former Neurosurgeon   Substance  Use Topics    Alcohol use: Yes     Alcohol/week: 1.0 standard drinks     Types: 1 Glasses of wine per week     Comment:  once a month     Drug use: No         The following sections were reviewed this encounter by the provider:   Tobacco   Allergies   Meds   Problems   Med Hx   Surg Hx   Fam Hx            ROS:  Review of Systems   General/Constitutional:   Denies Chills. Denies Fever.   Ophthalmologic:   Denies Blurred vision.   ENT:   Denies Nasal Discharge. Denies Sinus pain. Denies Sore throat.   Respiratory:   Denies Cough. Denies Shortness of breath. Denies Wheezing.   Cardiovascular:   Denies Chest pain. Denies Chest pain with exertion. Denies Palpitations. Denies  Swelling in hands/feet.   Gastrointestinal:   Denies Abdominal pain. Denies Constipation. Denies Diarrhea. Denies Nausea. Denies  Vomiting.   Skin:   Denies Rash.   Neurologic:   Denies Dizziness. Denies Headache.    Objective:   Vitals:  There were no vitals taken for this visit.      Physical Exam:  General Examination:   Physical Exam   GENERAL APPEARANCE: alert, in no acute distress, appearing at baseline  LUNGS: normal effort   NEUROLOGIC: alert and oriented.   Psych : mood , thought process appears normal, maintains good eye contact   Assessment / Plan       1. Essential hypertension  - Lipid panel; Future  Pt stable on current medication regimen, no concerns. Continue current medication    2. Mixed hyperlipidemia  - Lipid panel; Future  - Comprehensive metabolic panel; Future  Pt stable on current medication regimen, no concerns. Continue current medication    3. Psoriatic arthritis  Follow-up with a rheumatologist  Continue medication   continue vitamin D supplement      Time spent in discussion: 21 to 30 minutes     Follow-up:   No follow-ups on file.       Morrison Old, MD

## 2019-09-30 ENCOUNTER — Encounter (INDEPENDENT_AMBULATORY_CARE_PROVIDER_SITE_OTHER): Payer: Self-pay

## 2019-10-15 ENCOUNTER — Other Ambulatory Visit (FREE_STANDING_LABORATORY_FACILITY): Payer: Medicare Other

## 2019-10-15 DIAGNOSIS — I10 Essential (primary) hypertension: Secondary | ICD-10-CM

## 2019-10-15 DIAGNOSIS — E782 Mixed hyperlipidemia: Secondary | ICD-10-CM

## 2019-10-15 DIAGNOSIS — Z79899 Other long term (current) drug therapy: Secondary | ICD-10-CM

## 2019-10-15 LAB — GFR
EGFR: >60
EGFR: >60

## 2019-10-15 LAB — LIPID PANEL
Cholesterol / HDL Ratio: 3.5
Cholesterol: 212 mg/dL — ABNORMAL HIGH (ref 0–199)
HDL: 60 mg/dL (ref 40–9999)
LDL Calculated: 133 mg/dL — ABNORMAL HIGH (ref 0–99)
Triglycerides: 95 mg/dL (ref 34–149)
VLDL Calculated: 19 mg/dL (ref 10–40)

## 2019-10-15 LAB — COMPREHENSIVE METABOLIC PANEL
ALT: 11 U/L (ref 0–55)
AST (SGOT): 15 U/L (ref 5–34)
Albumin/Globulin Ratio: 1.3 (ref 0.9–2.2)
Albumin: 3.9 g/dL (ref 3.5–5.0)
Alkaline Phosphatase: 87 U/L (ref 37–106)
Anion Gap: 9 (ref 5.0–15.0)
BUN: 12 mg/dL (ref 7.0–19.0)
Bilirubin, Total: 0.5 mg/dL (ref 0.2–1.2)
CO2: 25 mEq/L (ref 21–29)
Calcium: 9.1 mg/dL (ref 8.5–10.5)
Chloride: 106 mEq/L (ref 100–111)
Creatinine: 0.9 mg/dL (ref 0.4–1.5)
Globulin: 3 g/dL (ref 2.0–3.7)
Glucose: 89 mg/dL (ref 70–100)
Potassium: 4.4 mEq/L (ref 3.5–5.1)
Protein, Total: 6.9 g/dL (ref 6.0–8.3)
Sodium: 140 mEq/L (ref 136–145)

## 2019-10-15 LAB — CBC AND DIFFERENTIAL
Absolute NRBC: 0 10*3/uL (ref 0.00–0.00)
Basophils Absolute Automated: 0.04 10*3/uL (ref 0.00–0.08)
Basophils Automated: 0.8 %
Eosinophils Absolute Automated: 0.09 10*3/uL (ref 0.00–0.44)
Eosinophils Automated: 1.8 %
Hematocrit: 39.6 % (ref 34.7–43.7)
Hgb: 12.1 g/dL (ref 11.4–14.8)
Immature Granulocytes Absolute: 0.01 10*3/uL (ref 0.00–0.07)
Immature Granulocytes: 0.2 %
Lymphocytes Absolute Automated: 2.29 10*3/uL (ref 0.42–3.22)
Lymphocytes Automated: 45.6 %
MCH: 27.6 pg (ref 25.1–33.5)
MCHC: 30.6 g/dL — ABNORMAL LOW (ref 31.5–35.8)
MCV: 90.4 fL (ref 78.0–96.0)
MPV: 13.3 fL — ABNORMAL HIGH (ref 8.9–12.5)
Monocytes Absolute Automated: 0.51 10*3/uL (ref 0.21–0.85)
Monocytes: 10.2 %
Neutrophils Absolute: 2.08 10*3/uL (ref 1.10–6.33)
Neutrophils: 41.4 %
Nucleated RBC: 0 /100 WBC (ref 0.0–0.0)
Platelets: 154 10*3/uL (ref 142–346)
RBC: 4.38 10*6/uL (ref 3.90–5.10)
RDW: 15 % (ref 11–15)
WBC: 5.02 10*3/uL (ref 3.10–9.50)

## 2019-10-15 LAB — CREATININE, SERUM: Creatinine: 0.9 mg/dL (ref 0.4–1.5)

## 2019-10-15 LAB — HEMOLYSIS INDEX
Hemolysis Index: 10 (ref 0–18)
Hemolysis Index: 3 (ref 0–18)

## 2019-10-15 LAB — ALT: ALT: 11 U/L (ref 0–55)

## 2019-10-15 LAB — C-REACTIVE PROTEIN: C-Reactive Protein: 0.6 mg/dL (ref 0.0–0.8)

## 2019-10-15 LAB — AST: AST (SGOT): 14 U/L (ref 5–34)

## 2019-10-31 ENCOUNTER — Encounter (INDEPENDENT_AMBULATORY_CARE_PROVIDER_SITE_OTHER): Payer: Self-pay

## 2019-11-01 ENCOUNTER — Encounter (INDEPENDENT_AMBULATORY_CARE_PROVIDER_SITE_OTHER): Payer: Self-pay

## 2019-11-12 ENCOUNTER — Encounter (INDEPENDENT_AMBULATORY_CARE_PROVIDER_SITE_OTHER): Payer: Self-pay | Admitting: Internal Medicine

## 2019-12-01 ENCOUNTER — Encounter (INDEPENDENT_AMBULATORY_CARE_PROVIDER_SITE_OTHER): Payer: Self-pay

## 2019-12-02 ENCOUNTER — Encounter (INDEPENDENT_AMBULATORY_CARE_PROVIDER_SITE_OTHER): Payer: Self-pay

## 2019-12-24 ENCOUNTER — Telehealth (INDEPENDENT_AMBULATORY_CARE_PROVIDER_SITE_OTHER): Payer: Self-pay | Admitting: Internal Medicine

## 2019-12-24 ENCOUNTER — Ambulatory Visit (INDEPENDENT_AMBULATORY_CARE_PROVIDER_SITE_OTHER): Payer: Medicare Other | Admitting: Internal Medicine

## 2019-12-24 ENCOUNTER — Encounter (INDEPENDENT_AMBULATORY_CARE_PROVIDER_SITE_OTHER): Payer: Medicare Other | Admitting: Internal Medicine

## 2019-12-24 NOTE — Progress Notes (Deleted)
Subjective:      Date: 12/24/2019 3:43 PM   Patient ID: Barbara Rubio is a 69 y.o. female.    Chief Complaint:  Chief Complaint   Patient presents with    Hypertension         HPI:  Hypertension  The patient is being seen for a routine follow-up of hypertension. Hypertension is classified as essential. Comorbid illnesses include hyperlipidemia. There is no interval history of chest pain.  Current therapy includes ACE inhibitors. Patient is compliant with the current regimen.   The patient's blood pressure response to medications is at goal.  BP at home 172/93 yesterday, took an extra dose of lisinopril 135/80 later in the day  BP this morning 141/92    Problem List:  Patient Active Problem List   Diagnosis    Essential hypertension    Mixed hyperlipidemia    Pain in both hands    Pain in both feet    Arthralgia, unspecified joint    Chronic pain of both ankles       Current Medications:  No outpatient medications have been marked as taking for the 12/24/19 encounter (Office Visit) with Joen Laura, MD.       Allergies:  No Known Allergies    Past Medical History:  Past Medical History:   Diagnosis Date    Hyperlipidemia     Hypertension     Left wrist fracture 2018       Past Surgical History:  Past Surgical History:   Procedure Laterality Date    HYSTERECTOMY       partial       Family History:  Family History   Problem Relation Age of Onset    Hypertension Mother     Hypertension Father     Hypertension Sister     Hypertension Brother        Social History:  Social History     Socioeconomic History    Marital status: Single     Spouse name: Not on file    Number of children: Not on file    Years of education: Not on file    Highest education level: Not on file   Occupational History    Not on file   Social Needs    Financial resource strain: Not on file    Food insecurity     Worry: Not on file     Inability: Not on file    Transportation needs     Medical: Not on file      Non-medical: Not on file   Tobacco Use    Smoking status: Never Smoker    Smokeless tobacco: Former Neurosurgeon   Substance and Sexual Activity    Alcohol use: Yes     Alcohol/week: 1.0 standard drinks     Types: 1 Glasses of wine per week     Comment:  once a month     Drug use: No    Sexual activity: Yes     Partners: Male     Birth control/protection: None   Lifestyle    Physical activity     Days per week: Not on file     Minutes per session: Not on file    Stress: Not on file   Relationships    Social connections     Talks on phone: Not on file     Gets together: Not on file     Attends religious service: Not on file  Active member of club or organization: Not on file     Attends meetings of clubs or organizations: Not on file     Relationship status: Not on file    Intimate partner violence     Fear of current or ex partner: Not on file     Emotionally abused: Not on file     Physically abused: Not on file     Forced sexual activity: Not on file   Other Topics Concern    Not on file   Social History Narrative    Not on file       The following sections were reviewed this encounter by the provider:        Vitals:  BP 131/78    Pulse 76    Temp 98.9 F (37.2 C)    Wt 111.6 kg (246 lb)    SpO2 98%    BMI 39.71 kg/m          ROS:  Review of Systems   Constitutional: Negative for chills and fever.   Respiratory: Negative for cough, shortness of breath and wheezing.    Cardiovascular: Negative for chest pain and palpitations.   Neurological: Positive for headaches (headache yesterday).         Objective:       Physical Exam:  General Examination:   GENERAL APPEARANCE: alert, in no acute distress, well developed, well nourished, oriented to time, place, and person.   HEAD: normal appearance, atraumatic.   EYES: extraocular movement intact (EOMI), pupils equal, round, reactive to light and accommodation, sclera anicteric, conjunctiva clear.   EARS: tympanic membranes normal bilaterally, external canals normal  .   NOSE: normal nasal mucosa, no lesions.   ORAL CAVITY: normal oropharynx, normal lips, mucosa moist, no lesions.   THROAT: normal appearance, clear, no erythema.   NECK/THYROID: neck supple,  no cervical lymphadenopathy, no neck mass palpated, no thyromegaly.   LYMPH NODES: no palpable adenopathy.   SKIN: good turgor, no rashes, no suspicious lesions.   HEART: S1, S2 normal, no murmurs, rubs, gallops, regular rate and rhythm.   LUNGS: normal effort / no distress, normal breath sounds, clear to auscultation bilaterally, no wheezes, rales, rhonchi.   ABDOMEN: bowel sounds present, no hepatosplenomegaly, soft, nontender, nondistended.   MUSCULOSKELETAL: full range of motion, no swelling or deformity.   EXTREMITIES: no edema, no clubbing, cyanosis, or edema.   PERIPHERAL PULSES: 2+ dorsalis pedis, 2+ posterior tibial.   NEUROLOGIC: nonfocal, cranial nerves 2-12 grossly intact, deep tendon reflexes 2+ symmetrical, normal strength and tone, sensory exam intact.   PSYCH: cognitive function intact, mood/affect full range, speech clear.       Assessment/Plan:       There are no diagnoses linked to this encounter.        Joen Laura, MD

## 2019-12-24 NOTE — Telephone Encounter (Signed)
Pt is requesting to speak to nurse in regards to bp spiking up. Pt is expecting call today before appt per office.    Thank you

## 2019-12-24 NOTE — Telephone Encounter (Signed)
Patient was advised to take medication as ordered by provider. Patient was also encouraged to call the on call provider if she has any issues after office hours. Patient sch to come in today.

## 2019-12-26 ENCOUNTER — Encounter (INDEPENDENT_AMBULATORY_CARE_PROVIDER_SITE_OTHER): Payer: Self-pay | Admitting: Internal Medicine

## 2019-12-26 ENCOUNTER — Other Ambulatory Visit (INDEPENDENT_AMBULATORY_CARE_PROVIDER_SITE_OTHER): Payer: Self-pay | Admitting: Internal Medicine

## 2019-12-26 DIAGNOSIS — I10 Essential (primary) hypertension: Secondary | ICD-10-CM

## 2019-12-26 NOTE — Progress Notes (Signed)
This encounter was created in error - please disregard.    Items noted as "reviewed" are for administrative purposes only and are not guaranteed by the provider to be accurate on this date.

## 2019-12-29 ENCOUNTER — Encounter (INDEPENDENT_AMBULATORY_CARE_PROVIDER_SITE_OTHER): Payer: Self-pay

## 2019-12-30 ENCOUNTER — Encounter (INDEPENDENT_AMBULATORY_CARE_PROVIDER_SITE_OTHER): Payer: Self-pay

## 2020-01-01 ENCOUNTER — Telehealth (INDEPENDENT_AMBULATORY_CARE_PROVIDER_SITE_OTHER): Payer: Self-pay | Admitting: Internal Medicine

## 2020-01-01 DIAGNOSIS — Z1231 Encounter for screening mammogram for malignant neoplasm of breast: Secondary | ICD-10-CM

## 2020-01-01 DIAGNOSIS — R319 Hematuria, unspecified: Secondary | ICD-10-CM

## 2020-01-01 DIAGNOSIS — E2839 Other primary ovarian failure: Secondary | ICD-10-CM

## 2020-01-01 NOTE — Telephone Encounter (Signed)
Pt called requesting 2 orders and 1 referral.    1) Pt has an upcoming appt on 03/29 at [facility]. Pt is requesting the 3D Mammogram Tomosynthesis and DXA bone density orders to be in the system and fax over to [phone] ASAP.    2) Referral Request     Requested Referral Information:    Office Name/Specialty: Urologist   Doctor Name (If requesting specific provider):  Genevie Cheshire. Karleen Hampshire, DNP, FNP-BC, CUNP  [address]  [place], [place] [address]    Tel: [phone]  Fax: [phone]    Appointment Scheduled (Y/N): Y   If Yes, Date of Scheduled appt: 03/31    Once the referral and orders are ready and faxed over, please call pt back to inform or leave a VM at [phone].    Thank you.

## 2020-01-05 NOTE — Telephone Encounter (Signed)
Please review

## 2020-01-06 NOTE — Telephone Encounter (Signed)
Looks like this pt has been seeing you in the last year and a half

## 2020-01-06 NOTE — Telephone Encounter (Signed)
Order done . Please fax.thanks

## 2020-01-06 NOTE — Telephone Encounter (Signed)
Faxed

## 2020-01-15 ENCOUNTER — Other Ambulatory Visit (INDEPENDENT_AMBULATORY_CARE_PROVIDER_SITE_OTHER): Payer: Self-pay | Admitting: Internal Medicine

## 2020-01-15 DIAGNOSIS — E782 Mixed hyperlipidemia: Secondary | ICD-10-CM

## 2020-01-19 ENCOUNTER — Other Ambulatory Visit: Payer: Self-pay | Admitting: Internal Medicine

## 2020-01-19 ENCOUNTER — Other Ambulatory Visit (INDEPENDENT_AMBULATORY_CARE_PROVIDER_SITE_OTHER): Payer: Self-pay | Admitting: Internal Medicine

## 2020-01-19 DIAGNOSIS — R928 Other abnormal and inconclusive findings on diagnostic imaging of breast: Secondary | ICD-10-CM

## 2020-01-21 ENCOUNTER — Other Ambulatory Visit (INDEPENDENT_AMBULATORY_CARE_PROVIDER_SITE_OTHER): Payer: Self-pay | Admitting: Internal Medicine

## 2020-01-21 DIAGNOSIS — Z1231 Encounter for screening mammogram for malignant neoplasm of breast: Secondary | ICD-10-CM

## 2020-01-21 DIAGNOSIS — E2839 Other primary ovarian failure: Secondary | ICD-10-CM

## 2020-01-22 ENCOUNTER — Encounter (INDEPENDENT_AMBULATORY_CARE_PROVIDER_SITE_OTHER): Payer: Self-pay | Admitting: Internal Medicine

## 2020-01-27 ENCOUNTER — Ambulatory Visit (INDEPENDENT_AMBULATORY_CARE_PROVIDER_SITE_OTHER): Payer: Medicare Other | Admitting: Specialist

## 2020-01-27 ENCOUNTER — Encounter (INDEPENDENT_AMBULATORY_CARE_PROVIDER_SITE_OTHER): Payer: Self-pay | Admitting: Specialist

## 2020-01-27 VITALS — BP 128/78 | HR 71 | Temp 98.5°F | Resp 18 | Ht 67.0 in | Wt 244.8 lb

## 2020-01-27 DIAGNOSIS — G5602 Carpal tunnel syndrome, left upper limb: Secondary | ICD-10-CM

## 2020-01-27 DIAGNOSIS — L4050 Arthropathic psoriasis, unspecified: Secondary | ICD-10-CM | POA: Insufficient documentation

## 2020-01-27 DIAGNOSIS — E782 Mixed hyperlipidemia: Secondary | ICD-10-CM

## 2020-01-27 DIAGNOSIS — Z01810 Encounter for preprocedural cardiovascular examination: Secondary | ICD-10-CM

## 2020-01-27 DIAGNOSIS — I10 Essential (primary) hypertension: Secondary | ICD-10-CM

## 2020-01-27 NOTE — Progress Notes (Signed)
Date: 01/27/2020 2:23 PM   Patient ID: Barbara Rubio is a 69 y.o. female.         Have you seen any specialists/other providers since your last visit with [place]?    No    Arm preference verified?   Yes    The patient is due for shingles vaccine    Subjective:     Chief Complaint:  Chief Complaint   Patient presents with    Pre-op Exam       HPI:  Visit Type: Pre-operative Evaluation  Procedure: left hand surgery  Date of Surgery: 02/04/20  Surgeon: Precious Gilding  Fax Number (Required): [phone] [facility]  Reason for Surgery: Carpal tunnel   Recent Health (admits): no current complaints  Recent Health (denies): no current complaints  ---------------------------------------  Exercise Tolerance: 4 met (i.e. climbing stairs )  Surgical Risk Factors: hypertension  Prior Anesthesia: Patient reports no adverse reaction to anesthesia in the past.     Pt has stable essential HTN with no evidence of CHF or proteinuria.  The patient is compliant with anti-hypertensive therapy and denies side effects to therapy.  Pt denies CP, SOB, dizziness, orthopnea, PND or edema.       Problem List:  Patient Active Problem List   Diagnosis    Essential hypertension    Mixed hyperlipidemia    Pain in both hands    Pain in both feet    Arthralgia, unspecified joint    Chronic pain of both ankles    Psoriatic arthritis       Current Medications:  Current Outpatient Medications   Medication Sig Dispense Refill    atorvastatin (LIPITOR) 10 MG tablet TAKE 1 TABLET BY MOUTH EVERY DAY AT NIGHT 90 tablet 1    lisinopril (ZESTRIL) 5 MG tablet TAKE 1 TABLET BY MOUTH EVERY DAY 90 tablet 1     No current facility-administered medications for this visit.       Allergies:  No Known Allergies    Past Medical History:  Past Medical History:   Diagnosis Date    Hyperlipidemia     Hypertension     Left wrist fracture 2018       Past Surgical History:  Past Surgical History:   Procedure Laterality Date    HYSTERECTOMY        partial       Family History:  Family History   Problem Relation Age of Onset    Hypertension Mother     Hypertension Father     Hypertension Sister     Hypertension Brother        Social History:  Social History     Socioeconomic History    Marital status: Single     Spouse name: Not on file    Number of children: Not on file    Years of education: Not on file    Highest education level: Not on file   Occupational History    Not on file   Tobacco Use    Smoking status: Never Smoker    Smokeless tobacco: Former Neurosurgeon   Substance and Sexual Activity    Alcohol use: Yes     Alcohol/week: 1.0 standard drinks     Types: 1 Glasses of wine per week     Comment:  once a month     Drug use: No    Sexual activity: Yes     Partners: Male     Birth control/protection: None  Other Topics Concern    Not on file   Social History Narrative    Not on file     Social Determinants of Health     Financial Resource Strain:     Difficulty of Paying Living Expenses:    Food Insecurity:     Worried About Programme researcher, broadcasting/film/video in the Last Year:     Barista in the Last Year:    Transportation Needs:     Freight forwarder (Medical):     Lack of Transportation (Non-Medical):    Physical Activity:     Days of Exercise per Week:     Minutes of Exercise per Session:    Stress:     Feeling of Stress :    Social Connections:     Frequency of Communication with Friends and Family:     Frequency of Social Gatherings with Friends and Family:     Attends Religious Services:     Active Member of Clubs or Organizations:     Attends Banker Meetings:     Marital Status:    Intimate Partner Violence:     Fear of Current or Ex-Partner:     Emotionally Abused:     Physically Abused:     Sexually Abused:        The following sections were reviewed this encounter by the provider:   Tobacco   Allergies   Meds   Problems   Med Hx   Surg Hx   Fam Hx          Vitals:  BP 128/78    Pulse 71    Temp 98.5 F  (36.9 C) (Oral)    Resp 18    Ht 1.702 m (5\' 7" )    Wt 111 kg (244 lb 12.8 oz)    SpO2 96%    BMI 38.34 kg/m           ROS:   Review of Systems   Constitutional: Negative.  Negative for fatigue, fever and unexpected weight change.   HENT: Negative for congestion, dental problem, drooling, ear pain, nosebleeds, sore throat and trouble swallowing.    Eyes: Negative for pain, discharge, redness and visual disturbance.   Respiratory: Negative for cough, chest tightness, shortness of breath and wheezing.    Cardiovascular: Negative for chest pain, palpitations and leg swelling.   Gastrointestinal: Negative for abdominal pain, blood in stool, constipation, diarrhea, nausea and vomiting.   Endocrine: Negative for cold intolerance, heat intolerance, polydipsia and polyuria.   Genitourinary: Negative for difficulty urinating, dysuria, flank pain, frequency, menstrual problem, vaginal bleeding and vaginal pain.   Musculoskeletal: Negative for arthralgias, back pain, joint swelling, myalgias and neck pain.   Skin: Negative for rash.   Neurological: Positive for weakness and numbness. Negative for dizziness, tremors, speech difficulty and headaches.        Left hand pain, tingling, numbness and weakness   Hematological: Negative for adenopathy. Does not bruise/bleed easily.   Psychiatric/Behavioral: Negative for dysphoric mood and sleep disturbance. The patient is not nervous/anxious.    All other systems reviewed and are negative.     Objective:     Examination:   Physical Exam  Vitals and nursing note reviewed.   Constitutional:       General: She is not in acute distress.     Appearance: Normal appearance. She is not toxic-appearing.   HENT:      Head: Normocephalic and atraumatic.  Right Ear: Tympanic membrane, ear canal and external ear normal.      Left Ear: Tympanic membrane, ear canal and external ear normal.      Nose: Nose normal. No congestion or rhinorrhea.      Mouth/Throat:      Mouth: Mucous membranes are  moist.      Pharynx: No oropharyngeal exudate or posterior oropharyngeal erythema.   Eyes:      General: No scleral icterus.     Extraocular Movements: Extraocular movements intact.      Pupils: Pupils are equal, round, and reactive to light.   Neck:      Vascular: No carotid bruit.   Cardiovascular:      Rate and Rhythm: Normal rate and regular rhythm.      Pulses: Normal pulses.      Heart sounds: Normal heart sounds. No murmur. No friction rub. No gallop.    Pulmonary:      Effort: Pulmonary effort is normal. No respiratory distress.      Breath sounds: Normal breath sounds. No wheezing, rhonchi or rales.   Abdominal:      General: Bowel sounds are normal. There is no distension.      Palpations: Abdomen is soft. There is no mass.      Tenderness: There is no abdominal tenderness.   Genitourinary:     Comments: Deferred  Musculoskeletal:         General: Normal range of motion.      Cervical back: Normal range of motion and neck supple.      Right lower leg: No edema.      Left lower leg: No edema.   Lymphadenopathy:      Cervical: No cervical adenopathy.   Skin:     General: Skin is warm and dry.      Findings: No bruising or rash.   Neurological:      General: No focal deficit present.      Mental Status: She is alert and oriented to person, place, and time.      Cranial Nerves: No cranial nerve deficit.      Sensory: No sensory deficit.      Motor: No weakness.      Gait: Gait normal.   Psychiatric:         Mood and Affect: Mood normal.         Behavior: Behavior normal.         Thought Content: Thought content normal.               Assessment/Plan:     1. Preop cardiovascular exam  - ECG 12 lead    2. Carpal tunnel syndrome of left wrist    Surgery scheduled for next week.    3. Essential hypertension      Current blood pressure is adequately controlled on the current medication regimen. Continue to optimize low salt diet and aerobic exercise efforts. and Continue current medication regimen.       4. Mixed  hyperlipidemia    Stable.  Continue current medication.    Pre-Operative Evaluation:  Surgery Specific Risk:  Low (endoscopic, superficial, breast, opthalmologic, minor orthopedic)  - Electrocardiogram:   normal EKG, normal sinus rhythm, no acute ST-T changes .  - Pt has no active cardiovascular symptoms and has good functional capacity.  Pt cleared for surgery with acceptable risk.      Karlyn Agee, MD

## 2020-01-28 ENCOUNTER — Telehealth (INDEPENDENT_AMBULATORY_CARE_PROVIDER_SITE_OTHER): Payer: Self-pay

## 2020-01-28 ENCOUNTER — Ambulatory Visit (INDEPENDENT_AMBULATORY_CARE_PROVIDER_SITE_OTHER): Payer: Medicare Other | Admitting: Specialist

## 2020-01-28 NOTE — Telephone Encounter (Signed)
Pt preop visit notes faxed on 01/28/20

## 2020-01-29 ENCOUNTER — Encounter (INDEPENDENT_AMBULATORY_CARE_PROVIDER_SITE_OTHER): Payer: Self-pay

## 2020-01-30 ENCOUNTER — Encounter (INDEPENDENT_AMBULATORY_CARE_PROVIDER_SITE_OTHER): Payer: Self-pay

## 2020-02-04 ENCOUNTER — Encounter (INDEPENDENT_AMBULATORY_CARE_PROVIDER_SITE_OTHER): Payer: Self-pay | Admitting: Internal Medicine

## 2020-02-27 ENCOUNTER — Ambulatory Visit (INDEPENDENT_AMBULATORY_CARE_PROVIDER_SITE_OTHER): Payer: Medicare Other | Admitting: Internal Medicine

## 2020-02-27 ENCOUNTER — Encounter (INDEPENDENT_AMBULATORY_CARE_PROVIDER_SITE_OTHER): Payer: Self-pay | Admitting: Internal Medicine

## 2020-02-27 VITALS — BP 141/78 | HR 69 | Temp 97.9°F | Wt 245.6 lb

## 2020-02-27 DIAGNOSIS — L4050 Arthropathic psoriasis, unspecified: Secondary | ICD-10-CM

## 2020-02-27 DIAGNOSIS — R42 Dizziness and giddiness: Secondary | ICD-10-CM

## 2020-02-27 DIAGNOSIS — I10 Essential (primary) hypertension: Secondary | ICD-10-CM

## 2020-02-27 DIAGNOSIS — E782 Mixed hyperlipidemia: Secondary | ICD-10-CM

## 2020-02-27 MED ORDER — LISINOPRIL 2.5 MG PO TABS
2.5000 mg | ORAL_TABLET | Freq: Every day | ORAL | 5 refills | Status: DC
Start: 2020-02-27 — End: 2020-06-08

## 2020-02-27 NOTE — Progress Notes (Signed)
Have you seen any specialists/other providers since your last visit with us?    Yes    Arm preference verified?   Yes    The patient is due for shingles vaccine and Advance Directive

## 2020-02-27 NOTE — Progress Notes (Signed)
Subjective:      Date: 02/27/2020 11:58 AM   Patient ID: Barbara Rubio is a 69 y.o. female.    Chief Complaint:  Chief Complaint   Patient presents with    Hypertension       HPI:  HPI     Wednesday morning - woke up , felt eyes could not focus   " head felt swimming"  Sitting straight - felt better   Lying down "symptoms recurred "  Now symptoms resolved    Has been checking her blood pressure at home and it has been in the high range  1 40-1 60/80-90  New blood pressure machine  Never been calibrated in the doctor's office    Blood pressure in the doctor's office today is lower than at home readings  She is concerned about her blood pressure    -Patient is also interested in seeing a bariatric surgeon for weight loss surgery    Appointment on 10/15/2019   Component Date Value Ref Range Status    C-Reactive Protein 10/15/2019 0.6  0.0 - 0.8 mg/dL Final    WBC 16/07/9603 5.02  3.10 - 9.50 x10 3/uL Final    Hgb 10/15/2019 12.1  11.4 - 14.8 g/dL Final    Hematocrit 54/06/8118 39.6  34.7 - 43.7 % Final    Platelets 10/15/2019 154  142 - 346 x10 3/uL Final    RBC 10/15/2019 4.38  3.90 - 5.10 x10 6/uL Final    MCV 10/15/2019 90.4  78.0 - 96.0 fL Final    MCH 10/15/2019 27.6  25.1 - 33.5 pg Final    MCHC 10/15/2019 30.6* 31.5 - 35.8 g/dL Final    RDW 14/78/2956 15  11 - 15 % Final    MPV 10/15/2019 13.3* 8.9 - 12.5 fL Final    Neutrophils 10/15/2019 41.4  None % Final    Lymphocytes Automated 10/15/2019 45.6  None % Final    Monocytes 10/15/2019 10.2  None % Final    Eosinophils Automated 10/15/2019 1.8  None % Final    Basophils Automated 10/15/2019 0.8  None % Final    Immature Granulocytes 10/15/2019 0.2  None % Final    Nucleated RBC 10/15/2019 0.0  0.0 - 0.0 /100 WBC Final    Neutrophils Absolute 10/15/2019 2.08  1.10 - 6.33 x10 3/uL Final    Lymphocytes Absolute Automated 10/15/2019 2.29  0.42 - 3.22 x10 3/uL Final    Monocytes Absolute Automated 10/15/2019 0.51  0.21 - 0.85 x10 3/uL  Final    Eosinophils Absolute Automated 10/15/2019 0.09  0.00 - 0.44 x10 3/uL Final    Basophils Absolute Automated 10/15/2019 0.04  0.00 - 0.08 x10 3/uL Final    Immature Granulocytes Absolute 10/15/2019 0.01  0.00 - 0.07 x10 3/uL Final    Absolute NRBC 10/15/2019 0.00  0.00 - 0.00 x10 3/uL Final    Creatinine 10/15/2019 0.9  0.4 - 1.5 mg/dL Final    ALT 21/30/8657 11  0 - 55 U/L Final    AST (SGOT) 10/15/2019 14  5 - 34 U/L Final    Hemolysis Index 10/15/2019 10  0 - 18 Final    EGFR 10/15/2019 >60.0   Final    Comment: Disease State Reference Ranges:    Chronic Kidney Disease; < 60 ml/min/1.73 sq.m    Kidney Failure; < 15 ml/min/1.73 sq.m    [Calculated using IDMS-Traceable MDRD equation (based on    gender, age and black vs. non-black race) recommended by  National Kidney Disease Education Program. No data    available for non-white, non-black race.]  GFR estimates are unreliable in patients with:    Rapidly changing kidney function or recent dialysis,    extreme age, body size or body composition(obesity,    severe malnutrition). Abnormal muscle mass (limb    amputation, muscle wasting). In these patients,    alternative determinations of GFR should be obtained.         Problem List:  Patient Active Problem List   Diagnosis    Essential hypertension    Mixed hyperlipidemia    Pain in both hands    Pain in both feet    Arthralgia, unspecified joint    Chronic pain of both ankles    Psoriatic arthritis    Morbid obesity       Current Medications:  Outpatient Medications Marked as Taking for the 02/27/20 encounter (Office Visit) with Morrison Old, MD   Medication Sig Dispense Refill    atorvastatin (LIPITOR) 10 MG tablet TAKE 1 TABLET BY MOUTH EVERY DAY AT NIGHT 90 tablet 1    estradiol (ESTRACE) 0.1 MG/GM vaginal cream       lisinopril (ZESTRIL) 5 MG tablet TAKE 1 TABLET BY MOUTH EVERY DAY 90 tablet 1       Allergies:  No Known Allergies    Past Medical History:  Past Medical History:    Diagnosis Date    Hyperlipidemia     Hypertension     Left wrist fracture 2018       Past Surgical History:  Past Surgical History:   Procedure Laterality Date    HYSTERECTOMY       partial       Family History:  Family History   Problem Relation Age of Onset    Hypertension Mother     Hypertension Father     Hypertension Sister     Hypertension Brother        Social History:  Social History     Socioeconomic History    Marital status: Single     Spouse name: Not on file    Number of children: Not on file    Years of education: Not on file    Highest education level: Not on file   Occupational History    Not on file   Tobacco Use    Smoking status: Never Smoker    Smokeless tobacco: Former Neurosurgeon   Substance and Sexual Activity    Alcohol use: Yes     Alcohol/week: 1.0 standard drinks     Types: 1 Glasses of wine per week     Comment:  once a month     Drug use: No    Sexual activity: Yes     Partners: Male     Birth control/protection: None   Other Topics Concern    Not on file   Social History Narrative    Not on file     Social Determinants of Health     Financial Resource Strain:     Difficulty of Paying Living Expenses:    Food Insecurity:     Worried About Programme researcher, broadcasting/film/video in the Last Year:     Barista in the Last Year:    Transportation Needs:     Freight forwarder (Medical):     Lack of Transportation (Non-Medical):    Physical Activity:     Days of Exercise per Week:  Minutes of Exercise per Session:    Stress:     Feeling of Stress :    Social Connections:     Frequency of Communication with Friends and Family:     Frequency of Social Gatherings with Friends and Family:     Attends Religious Services:     Active Member of Clubs or Organizations:     Attends Banker Meetings:     Marital Status:    Intimate Partner Violence:     Fear of Current or Ex-Partner:     Emotionally Abused:     Physically Abused:     Sexually Abused:        The  following sections were reviewed this encounter by the provider:   Tobacco   Allergies   Meds   Problems   Med Hx   Surg Hx   Fam Hx          Vitals:  BP 141/78 (BP Site: Right arm, Patient Position: Sitting, Cuff Size: X-Large)    Pulse 69    Temp 97.9 F (36.6 C) (Tympanic)    Wt 111.4 kg (245 lb 9.6 oz)    SpO2 97%    BMI 38.47 kg/m        ROS:  General/Constitutional:   Denies Chills.  Denies Fever.   Ophthalmologic:   Denies Blurred vision.   ENT:   Denies Nasal Discharge. Denies Sinus pain. Denies Sore throat.   Respiratory:   Denies Cough. Denies Shortness of breath. Denies Wheezing.   Cardiovascular:   Denies Chest pain. Denies Chest pain with exertion. Denies Palpitations. Denies  Swelling in hands/feet.   Gastrointestinal:   Denies Abdominal pain.  Denies Diarrhea. Denies Nausea. Denies  Vomiting.   Skin:   Denies Rash.   Neurologic:   Denies Dizziness. Denies Headache.       Objective:       Physical Exam:  General Examination:   GENERAL APPEARANCE: alert, in no acute distress, well developed, well nourished,  oriented to time, place, and person.   ORAL CAVITY: normal oropharynx, normal lips, mucosa moist.   THROAT: normal appearance, clear.   NECK/THYROID: neck supple, no carotid bruit, carotid pulse 2+ bilaterally, no cervical  lymphadenopathy, no neck mass palpated, no jugular venous distention, no  thyromegaly.   HEART: S1, S2 normal, no murmurs, rubs, gallops, regular rate and rhythm.   LUNGS: normal effort / no distress, normal breath sounds, clear to auscultation  bilaterally, no wheezes, rales, rhonchi.   EXTREMITIES: no clubbing, cyanosis, or edema bilaterally.   PERIPHERAL PULSES: 2+ dorsalis pedis, 2+ posterior tibial bilaterally.   NEUROLOGIC: alert and oriented.         Assessment/Plan:       1. Vertigo  Positional vertigo  Now resolved  Advised hydration  2. Psoriatic arthritis  Follows with a rheumatologist  3. Essential hypertension  Will increase the blood pressure medicine  slightly  Add- lisinopril (ZESTRIL) 2.5 MG tablet; Take 1 tablet (2.5 mg total) by mouth daily  Dispense: 30 tablet; Refill: 5  Low-salt diet and exercise  Follow-up in 2 to 3 weeks  Blood pressure log  4. Mixed hyperlipidemia  Stable  5. Morbid obesity  - Bariatric Surgery Referral: Adline Mango, MD ([facility])          Morrison Old, MD

## 2020-02-28 ENCOUNTER — Encounter (INDEPENDENT_AMBULATORY_CARE_PROVIDER_SITE_OTHER): Payer: Self-pay

## 2020-02-29 ENCOUNTER — Encounter (INDEPENDENT_AMBULATORY_CARE_PROVIDER_SITE_OTHER): Payer: Self-pay

## 2020-03-09 ENCOUNTER — Encounter (INDEPENDENT_AMBULATORY_CARE_PROVIDER_SITE_OTHER): Payer: Self-pay | Admitting: Internal Medicine

## 2020-03-10 ENCOUNTER — Encounter (HOSPITAL_BASED_OUTPATIENT_CLINIC_OR_DEPARTMENT_OTHER): Payer: Self-pay

## 2020-03-15 ENCOUNTER — Encounter (INDEPENDENT_AMBULATORY_CARE_PROVIDER_SITE_OTHER): Payer: Self-pay | Admitting: Internal Medicine

## 2020-03-16 ENCOUNTER — Encounter (HOSPITAL_BASED_OUTPATIENT_CLINIC_OR_DEPARTMENT_OTHER): Payer: Self-pay

## 2020-03-16 ENCOUNTER — Encounter (HOSPITAL_BASED_OUTPATIENT_CLINIC_OR_DEPARTMENT_OTHER): Payer: Self-pay | Admitting: Surgery

## 2020-03-16 ENCOUNTER — Ambulatory Visit (INDEPENDENT_AMBULATORY_CARE_PROVIDER_SITE_OTHER): Payer: Medicare Other | Admitting: Surgery

## 2020-03-16 DIAGNOSIS — L4050 Arthropathic psoriasis, unspecified: Secondary | ICD-10-CM

## 2020-03-16 DIAGNOSIS — E782 Mixed hyperlipidemia: Secondary | ICD-10-CM

## 2020-03-16 DIAGNOSIS — K219 Gastro-esophageal reflux disease without esophagitis: Secondary | ICD-10-CM

## 2020-03-16 DIAGNOSIS — I10 Essential (primary) hypertension: Secondary | ICD-10-CM

## 2020-03-16 NOTE — Progress Notes (Signed)
Assessment:  Morbid obesity  BMI of 38  Hypertension  Hyperlipidemia  GERD  Psoriatic arthritis        Plan:  In brief,  [name]  is a 69 y.o. year old morbidly obese patient with comorbid conditions who has expressed an interest in robotic/laparoscopic SG-Sleeve. Patient was given a preoperative testing check list which includes dietary and psychiatric counseling for behavioral modification and medical/cardiac clearance (if indicated). Patient will be seen in the office on multiple occasions preoperatively.   In the best interest of this patients overall success, we would appreciate your assistance with changing medications to chewable, crushable and/or liquid form for postoperative period and necessary preoperative clearances.      History of Present Illness:  I had a pleasure of seeing [name] in the office in consultation for possible bariatric surgery to resolve and treat morbid obesity and comorbid conditions. Patient has tried and failed multiple previous attempts at conservative weight loss programs. Bariatric comorbidities present: hypertension and GERD  We had a long discussion and thoroughly reviewed past medical and surgical history. she has attended an informational seminar/webinar and was given a booklet summarizing weight loss surgery options, risks and results. The surgical options discussed include robotic/laparoscopic Roux-en Y gastric bypass, laparoscopic adjustable gastric banding and robotic/laparoscopic sleeve gastrectomy. All questions and concerns were addressed in detail.       The following portions of the patient's history were reviewed and updated as appropriate: allergies, current medications, past family history, past medical history, past social history, past surgical history and problem list.  CURRENT PROBLEM LIST:   Patient Active Problem List   Diagnosis    Essential hypertension    Mixed hyperlipidemia    Pain in both  hands    Pain in both feet    Arthralgia, unspecified joint    Chronic pain of both ankles    Psoriatic arthritis    Severe obesity (BMI 35.0-39.9) with comorbidity    Gastroesophageal reflux disease     PAST MEDICAL HISTORY:   Past Medical History:   Diagnosis Date    Hyperlipidemia     Hypertension     Left wrist fracture 2018     PAST SURGICAL HISTORY:   Past Surgical History:   Procedure Laterality Date    HYSTERECTOMY       partial     FAMILY HISTORY:    Family History   Problem Relation Age of Onset    Hypertension Mother     Hypertension Father     Hypertension Sister     Hypertension Brother      SOCIAL HISTORY:   Social History     Socioeconomic History    Marital status: Single     Spouse name: None    Number of children: None    Years of education: None    Highest education level: None   Occupational History    None   Tobacco Use    Smoking status: Never Smoker    Smokeless tobacco: Former Estate agent and Sexual Activity    Alcohol use: Yes     Alcohol/week: 1.0 standard drinks     Types: 1 Glasses of wine per week     Comment:  once a month     Drug use: No    Sexual activity: Yes     Partners: Male     Birth control/protection: None   Other Topics Concern    Dietary  supplements / vitamins Yes    Anesthesia problems No    Blood thinners No    Pregnant No    Future Children No    Number of Pregnancies? Yes     Comment: 1    Number of children Yes     Comment: 1    Miscarriages / Abortions? No    Eats large amounts No    Excessive Sweets Yes    Skips meals No    Eats excessive starches Yes    Snacks or grazes Yes    Emotional eater No    Eats fried food Yes    Eats fast food Yes    Diet Center No    Doylene Bode Yes    [facility] Weight [facility] No    Nutri-System Yes    Opti-Fast / Medi-Fast No    Overeaters Anonymous No    [facility] No    TOPS No    Weight Watchers Yes    Atkins No    Binging / Purging No    Calorie Counting Yes    Fasting  No    High Protein Yes    Low Carb Yes    Low Fat Yes    [facility] Diet No    Slim Fast No    [place] Yes    Stationary cycle or treadmill No    Gym/fitness Classes No    Home exercise/video No    Swimming No    Weight training No    Walking or running Yes    Hospitalization No    Hypnosis No    Physical therapy No    Psychological therapy No    Residential program No    Acutrim No    [place]etta No    Contrave No    Dexatrim No    Diethylpropion No    Fastin No    Fen - Phen No    Ionamin / Adipex No    Phentermine No    Qsymia No    Prozac No    Saxenda No    Topamax No    Wellbutrin No    Xenical (Orlistat, Alli) No    Other Med No    No impairment No    Walks with cane/crutch No    Requires a wheelchair No    Bedridden No    Are you currently being treated for depression? No    Do you snore? Yes    Are you receiving any medical or psychological services? No    Do you ever wake up at night gasping for breath? Yes    Do you have or have you been treated for an eating disorder? No    Anyone ever told you that you stop breathing while asleep? No    Do you exercise regularly? No    Have you or family member ever have trouble with anesthesia? Yes   Social History Narrative    None     Social Determinants of Health     Financial Resource Strain:     Difficulty of Paying Living Expenses:    Food Insecurity:     Worried About Programme researcher, broadcasting/film/video in the Last Year:     Barista in the Last Year:    Transportation Needs:     Freight forwarder (Medical):     Lack of Transportation (Non-Medical):    Physical Activity:     Days of  Exercise per Week:     Minutes of Exercise per Session:    Stress:     Feeling of Stress :    Social Connections:     Frequency of Communication with Friends and Family:     Frequency of Social Gatherings with Friends and Family:     Attends Religious Services:     Active Member of Clubs or Organizations:     Attends Museum/gallery exhibitions officer:     Marital Status:    Intimate Partner Violence:     Fear of Current or Ex-Partner:     Emotionally Abused:     Physically Abused:     Sexually Abused:     (Does not refresh)  TOBACCO HISTORY:   Social History     Tobacco Use   Smoking Status Never Smoker   Smokeless Tobacco Former Neurosurgeon     ALCOHOL HISTORY:   Social History     Substance and Sexual Activity   Alcohol Use Yes    Alcohol/week: 1.0 standard drinks    Types: 1 Glasses of wine per week    Comment:  once a month      DRUG HISTORY:   Social History     Substance and Sexual Activity   Drug Use No     CURRENT HOSPITAL MEDICATIONS:   Current Outpatient Medications   Medication Sig Dispense Refill    atorvastatin (LIPITOR) 10 MG tablet TAKE 1 TABLET BY MOUTH EVERY DAY AT NIGHT 90 tablet 1    estradiol (ESTRACE) 0.1 MG/GM vaginal cream       lisinopril (ZESTRIL) 2.5 MG tablet Take 1 tablet (2.5 mg total) by mouth daily 30 tablet 5    lisinopril (ZESTRIL) 5 MG tablet TAKE 1 TABLET BY MOUTH EVERY DAY 90 tablet 1     No current facility-administered medications for this visit.     CURRENT OUTPATIENT MEDICATIONS:   Outpatient Medications Marked as Taking for the 03/16/20 encounter (Office Visit) with Brooklen Runquist R, DO   Medication Sig Dispense Refill    atorvastatin (LIPITOR) 10 MG tablet TAKE 1 TABLET BY MOUTH EVERY DAY AT NIGHT 90 tablet 1    estradiol (ESTRACE) 0.1 MG/GM vaginal cream       lisinopril (ZESTRIL) 2.5 MG tablet Take 1 tablet (2.5 mg total) by mouth daily 30 tablet 5    lisinopril (ZESTRIL) 5 MG tablet TAKE 1 TABLET BY MOUTH EVERY DAY 90 tablet 1     ALLERGIES: No Known Allergies     Review of Systems  Constitutional: negative for fevers, night sweats  Respiratory: negative for SOB, cough  Cardiovascular: negative for chest pain, palpitations  Gastrointestinal: negative for nausea or vomiting  Genitourinary:negative for hematuria, dysuria  Musculoskeletal:negative for bone pain, myalgias and stiff  joints  Neurological: negative for dizziness, gait problems, headaches and memory problems  Behavioral/Psych: negative for fatigue, loss of interest in favorite activities, separation anxiety and sleep disturbance  Endocrine: negative for temperature intolerance  Integumentary: No rashes, no skin infections    Objective:    BP 165/77 (BP Site: Left arm, Patient Position: Sitting, Cuff Size: Large)    Pulse 81    Temp 97.5 F (36.4 C) (Temporal)    Ht 5\' 7"     Wt 248 lb    BMI 38.84 kg/m   Body mass index is 38.84 kg/m.  Weight: 248 lb       General Appearance:    Alert, cooperative, no distress, obese  Head:    Normocephalic, without obvious abnormality, atraumatic   Eyes:    No scleral icterus            Throat:   Lips, mucosa, and tongue normal; teeth and gums normal   Neck:   Supple, symmetrical, trachea midline, no adenopathy;        thyroid:  No enlargement/tenderness/nodules; no carotid    bruit or JVD   Back:     Symmetric, no curvature, ROM normal, no CVA tenderness   Lungs:     Clear to auscultation bilaterally, respirations unlabored   Chest wall:    No tenderness or deformity   Heart:    Regular rate and rhythm, S1 and S2 normal, no murmur, rub   or gallop   Abdomen:     Soft, non-tender, bowel sounds active all four quadrants,     no masses, no organomegaly   Extremities:   Extremities normal, atraumatic, no cyanosis or edema   Pulses:   2+ and symmetric all extremities   Skin:   Skin color, texture, turgor normal, no rashes or lesions   Lymph nodes:   Cervical, supraclavicular, and axillary nodes normal   Neurologic:   Alert and oriented x 3.  Able to move all extremities.       Data Review:     No visits with results within 3 Month(s) from this visit.   Latest known visit with results is:   Appointment on 10/15/2019   Component Date Value Ref Range Status    C-Reactive Protein 10/15/2019 0.6  0.0 - 0.8 mg/dL Final    WBC 36/64/4034 5.02  3.10 - 9.50 x10 3/uL Final    Hgb 10/15/2019 12.1  11.4 -  14.8 g/dL Final    Hematocrit 74/25/9563 39.6  34.7 - 43.7 % Final    Platelets 10/15/2019 154  142 - 346 x10 3/uL Final    RBC 10/15/2019 4.38  3.90 - 5.10 x10 6/uL Final    MCV 10/15/2019 90.4  78.0 - 96.0 fL Final    MCH 10/15/2019 27.6  25.1 - 33.5 pg Final    MCHC 10/15/2019 30.6* 31.5 - 35.8 g/dL Final    RDW 87/56/4332 15  11 - 15 % Final    MPV 10/15/2019 13.3* 8.9 - 12.5 fL Final    Neutrophils 10/15/2019 41.4  None % Final    Lymphocytes Automated 10/15/2019 45.6  None % Final    Monocytes 10/15/2019 10.2  None % Final    Eosinophils Automated 10/15/2019 1.8  None % Final    Basophils Automated 10/15/2019 0.8  None % Final    Immature Granulocytes 10/15/2019 0.2  None % Final    Nucleated RBC 10/15/2019 0.0  0.0 - 0.0 /100 WBC Final    Neutrophils Absolute 10/15/2019 2.08  1.10 - 6.33 x10 3/uL Final    Lymphocytes Absolute Automated 10/15/2019 2.29  0.42 - 3.22 x10 3/uL Final    Monocytes Absolute Automated 10/15/2019 0.51  0.21 - 0.85 x10 3/uL Final    Eosinophils Absolute Automated 10/15/2019 0.09  0.00 - 0.44 x10 3/uL Final    Basophils Absolute Automated 10/15/2019 0.04  0.00 - 0.08 x10 3/uL Final    Immature Granulocytes Absolute 10/15/2019 0.01  0.00 - 0.07 x10 3/uL Final    Absolute NRBC 10/15/2019 0.00  0.00 - 0.00 x10 3/uL Final    Creatinine 10/15/2019 0.9  0.4 - 1.5 mg/dL Final    ALT 95/18/8416 11  0 - 55 U/L  Final    AST (SGOT) 10/15/2019 14  5 - 34 U/L Final    Hemolysis Index 10/15/2019 10  0 - 18 Final    EGFR 10/15/2019 >60.0   Final    Comment: Disease State Reference Ranges:    Chronic Kidney Disease; < 60 ml/min/1.73 sq.m    Kidney Failure; < 15 ml/min/1.73 sq.m    [Calculated using IDMS-Traceable MDRD equation (based on    gender, age and black vs. non-black race) recommended by    [employer] Kidney Disease Education Program. No data    available for non-white, non-black race.]  GFR estimates are unreliable in patients with:    Rapidly changing kidney function  or recent dialysis,    extreme age, body size or body composition(obesity,    severe malnutrition). Abnormal muscle mass (limb    amputation, muscle wasting). In these patients,    alternative determinations of GFR should be obtained.       .  Radiology Results   No results found.         This note was generated by the [facility] EMR system/Dragon speech recognition and may contain inherent errors or omissions not intended by the user. Grammatical errors, random word insertions, deletions, pronoun errors and incomplete sentences are occasional consequences of this technology due to software limitations. Not all errors are caught or corrected. If there are questions or concerns about the content of this note or information contained within the body of this dictation they should be addressed directly with the author for clarification.          Uziel Covault R. Drevon Plog, DO, FACS, FASMBS, FACOS

## 2020-03-18 ENCOUNTER — Ambulatory Visit (INDEPENDENT_AMBULATORY_CARE_PROVIDER_SITE_OTHER): Payer: Medicare Other | Admitting: Internal Medicine

## 2020-03-18 ENCOUNTER — Encounter (HOSPITAL_BASED_OUTPATIENT_CLINIC_OR_DEPARTMENT_OTHER): Payer: Self-pay

## 2020-03-18 ENCOUNTER — Encounter (INDEPENDENT_AMBULATORY_CARE_PROVIDER_SITE_OTHER): Payer: Self-pay | Admitting: Internal Medicine

## 2020-03-18 ENCOUNTER — Encounter (INDEPENDENT_AMBULATORY_CARE_PROVIDER_SITE_OTHER): Payer: Self-pay

## 2020-03-18 VITALS — BP 135/76 | HR 80 | Temp 98.0°F | Wt 249.0 lb

## 2020-03-18 DIAGNOSIS — I10 Essential (primary) hypertension: Secondary | ICD-10-CM

## 2020-03-18 DIAGNOSIS — K219 Gastro-esophageal reflux disease without esophagitis: Secondary | ICD-10-CM

## 2020-03-18 NOTE — Progress Notes (Signed)
Subjective:      Date: 03/18/2020 9:08 AM   Patient ID: Barbara Rubio is a 69 y.o. female.    Chief Complaint:  Chief Complaint   Patient presents with    Referral     HTN, Endo ref, Forms for wt loss surgery        HPI:  HPI     Here for follow-up    -Following with  the bariatric surgeon for weight loss surgery  Needs a referral for gastroenterologist for endoscopy  Also needs form signed for weight loss surgery      HYPERTENSION  Visit type: routine follow-up of HTN.  She brought her home blood pressure machine with her today.  Home blood pressure readings completely off and 50 points higher than the office blood pressure reading  Blood pressure in the office is in the good range  Type: essential  Associated Conditions: no evidence of CHF  Compliance: is compliant with the current regimen  Comorbid illness: none  Interval events: no interval events  Interval symptoms: none  Response to medications: has been good  Prior testing: a chemistry profile showed normal renal function    Appointment on 10/15/2019   Component Date Value Ref Range Status    C-Reactive Protein 10/15/2019 0.6  0.0 - 0.8 mg/dL Final    WBC 16/07/9603 5.02  3.10 - 9.50 x10 3/uL Final    Hgb 10/15/2019 12.1  11.4 - 14.8 g/dL Final    Hematocrit 54/06/8118 39.6  34.7 - 43.7 % Final    Platelets 10/15/2019 154  142 - 346 x10 3/uL Final    RBC 10/15/2019 4.38  3.90 - 5.10 x10 6/uL Final    MCV 10/15/2019 90.4  78.0 - 96.0 fL Final    MCH 10/15/2019 27.6  25.1 - 33.5 pg Final    MCHC 10/15/2019 30.6* 31.5 - 35.8 g/dL Final    RDW 14/78/2956 15  11 - 15 % Final    MPV 10/15/2019 13.3* 8.9 - 12.5 fL Final    Neutrophils 10/15/2019 41.4  None % Final    Lymphocytes Automated 10/15/2019 45.6  None % Final    Monocytes 10/15/2019 10.2  None % Final    Eosinophils Automated 10/15/2019 1.8  None % Final    Basophils Automated 10/15/2019 0.8  None % Final    Immature Granulocytes 10/15/2019 0.2  None % Final    Nucleated RBC  10/15/2019 0.0  0.0 - 0.0 /100 WBC Final    Neutrophils Absolute 10/15/2019 2.08  1.10 - 6.33 x10 3/uL Final    Lymphocytes Absolute Automated 10/15/2019 2.29  0.42 - 3.22 x10 3/uL Final    Monocytes Absolute Automated 10/15/2019 0.51  0.21 - 0.85 x10 3/uL Final    Eosinophils Absolute Automated 10/15/2019 0.09  0.00 - 0.44 x10 3/uL Final    Basophils Absolute Automated 10/15/2019 0.04  0.00 - 0.08 x10 3/uL Final    Immature Granulocytes Absolute 10/15/2019 0.01  0.00 - 0.07 x10 3/uL Final    Absolute NRBC 10/15/2019 0.00  0.00 - 0.00 x10 3/uL Final    Creatinine 10/15/2019 0.9  0.4 - 1.5 mg/dL Final    ALT 21/30/8657 11  0 - 55 U/L Final    AST (SGOT) 10/15/2019 14  5 - 34 U/L Final    Hemolysis Index 10/15/2019 10  0 - 18 Final    EGFR 10/15/2019 >60.0   Final    Comment: Disease State Reference Ranges:    Chronic Kidney  Disease; < 60 ml/min/1.73 sq.m    Kidney Failure; < 15 ml/min/1.73 sq.m    [Calculated using IDMS-Traceable MDRD equation (based on    gender, age and black vs. non-black race) recommended by    [employer] Kidney Disease Education Program. No data    available for non-white, non-black race.]  GFR estimates are unreliable in patients with:    Rapidly changing kidney function or recent dialysis,    extreme age, body size or body composition(obesity,    severe malnutrition). Abnormal muscle mass (limb    amputation, muscle wasting). In these patients,    alternative determinations of GFR should be obtained.               Problem List:  Patient Active Problem List   Diagnosis    Essential hypertension    Mixed hyperlipidemia    Pain in both hands    Pain in both feet    Arthralgia, unspecified joint    Chronic pain of both ankles    Psoriatic arthritis    Severe obesity (BMI 35.0-39.9) with comorbidity    Gastroesophageal reflux disease       Current Medications:  Outpatient Medications Marked as Taking for the 03/18/20 encounter (Office Visit) with Morrison Old, MD   Medication Sig  Dispense Refill    atorvastatin (LIPITOR) 10 MG tablet TAKE 1 TABLET BY MOUTH EVERY DAY AT NIGHT 90 tablet 1    estradiol (ESTRACE) 0.1 MG/GM vaginal cream       lisinopril (ZESTRIL) 5 MG tablet TAKE 1 TABLET BY MOUTH EVERY DAY 90 tablet 1       Allergies:  No Known Allergies    Past Medical History:  Past Medical History:   Diagnosis Date    Hyperlipidemia     Hypertension     Left wrist fracture 2018       Past Surgical History:  Past Surgical History:   Procedure Laterality Date    HYSTERECTOMY       partial       Family History:  Family History   Problem Relation Age of Onset    Hypertension Mother     Hypertension Father     Hypertension Sister     Hypertension Brother        Social History:  Social History     Socioeconomic History    Marital status: Single     Spouse name: Not on file    Number of children: Not on file    Years of education: Not on file    Highest education level: Not on file   Occupational History    Not on file   Tobacco Use    Smoking status: Never Smoker    Smokeless tobacco: Former Neurosurgeon   Substance and Sexual Activity    Alcohol use: Yes     Alcohol/week: 1.0 standard drinks     Types: 1 Glasses of wine per week     Comment:  once a month     Drug use: No    Sexual activity: Yes     Partners: Male     Birth control/protection: None   Other Topics Concern    Dietary supplements / vitamins Yes    Anesthesia problems No    Blood thinners No    Pregnant No    Future Children No    Number of Pregnancies? Yes     Comment: 1    Number of children Yes     Comment:  1    Miscarriages / Abortions? No    Eats large amounts No    Excessive Sweets Yes    Skips meals No    Eats excessive starches Yes    Snacks or grazes Yes    Emotional eater No    Eats fried food Yes    Eats fast food Yes    Diet Center No    Doylene Bode Yes    [facility] Weight Loss No    Nutri-System Yes    Opti-Fast / Medi-Fast No    Overeaters Anonymous No    [facility] No     TOPS No    Weight Watchers Yes    Atkins No    Binging / Purging No    Calorie Counting Yes    Fasting No    High Protein Yes    Low Carb Yes    Low Fat Yes    [facility] Diet No    Slim Fast No    [place] Yes    Stationary cycle or treadmill No    Gym/fitness Classes No    Home exercise/video No    Swimming No    Weight training No    Walking or running Yes    Hospitalization No    Hypnosis No    Physical therapy No    Psychological therapy No    Residential program No    Acutrim No    [place] No    Contrave No    Dexatrim No    Diethylpropion No    Fastin No    Fen - Phen No    Ionamin / Adipex No    Phentermine No    Qsymia No    Prozac No    Saxenda No    Topamax No    Wellbutrin No    Xenical (Orlistat, Alli) No    Other Med No    No impairment No    Walks with cane/crutch No    Requires a wheelchair No    Bedridden No    Are you currently being treated for depression? No    Do you snore? Yes    Are you receiving any medical or psychological services? No    Do you ever wake up at night gasping for breath? Yes    Do you have or have you been treated for an eating disorder? No    Anyone ever told you that you stop breathing while asleep? No    Do you exercise regularly? No    Have you or family member ever have trouble with anesthesia? Yes   Social History Narrative    Not on file     Social Determinants of Health     Financial Resource Strain:     Difficulty of Paying Living Expenses:    Food Insecurity:     Worried About Programme researcher, broadcasting/film/video in the Last Year:     Barista in the Last Year:    Transportation Needs:     Freight forwarder (Medical):     Lack of Transportation (Non-Medical):    Physical Activity:     Days of Exercise per Week:     Minutes of Exercise per Session:    Stress:     Feeling of Stress :    Social Connections:     Frequency of Communication with Friends and Family:     Frequency of Social Gatherings with Friends  and Family:     Attends Religious Services:     Active Member of Clubs or Organizations:     Attends Banker Meetings:     Marital Status:    Intimate Partner Violence:     Fear of Current or Ex-Partner:     Emotionally Abused:     Physically Abused:     Sexually Abused:        The following sections were reviewed this encounter by the provider:   Tobacco   Allergies   Meds   Problems   Med Hx   Surg Hx   Fam Hx          Vitals:  BP 135/76 (BP Site: Left arm, Patient Position: Sitting, Cuff Size: Large)    Pulse 80    Temp 98 F (36.7 C) (Oral)    Wt 112.9 kg (249 lb)    SpO2 97%    BMI 39.00 kg/m       ROS:  General/Constitutional:   Denies Chills. Denies Fever.   Ophthalmologic:   Denies Blurred vision.   ENT:   Denies Nasal Discharge. Denies Sinus pain. Denies Sore throat.   Respiratory:   Denies Cough. Denies Shortness of breath. Denies Wheezing.   Cardiovascular:   Denies Chest pain. Denies Chest pain with exertion. Denies Palpitations. Denies  Swelling in hands/feet.   Gastrointestinal:   Denies Abdominal pain. Denies Constipation. Denies Diarrhea. Denies Nausea. Denies  Vomiting.   Skin:   Denies Rash.   Neurologic:   Denies Dizziness. Denies Headache. Denies Tingling/Numbness.        Objective:       Physical Exam:  General Examination:   GENERAL APPEARANCE: alert, in no acute distress, well developed, well nourished,  oriented to time, place, and person.   NECK/THYROID: neck supple, no carotid bruit, carotid pulse 2+ bilaterally, no cervical  lymphadenopathy, no neck mass palpated, no jugular venous distention, no  thyromegaly.   HEART: S1, S2 normal, no murmurs, rubs, gallops, regular rate and rhythm.   LUNGS: normal effort / no distress, normal breath sounds, clear to auscultation  bilaterally, no wheezes, rales, rhonchi.   EXTREMITIES: no clubbing, cyanosis, or edema bilaterally.   PERIPHERAL PULSES: 2+ dorsalis pedis, 2+ posterior tibial bilaterally.   NEUROLOGIC: alert and  oriented.        Assessment/Plan:       1. Essential hypertension  Pt stable on current medication regimen, no concerns. Continue current medication    2. Gastroesophageal reflux disease, unspecified whether esophagitis present  - Ambulatory referral to Gastroenterology  Continue dietary and lifestyle changes  3. Severe obesity (BMI 35.0-39.9) with comorbidity  Continue high protein, high-fiber  Continue lifestyle changes  Follow-up with the bariatric surgeon        Morrison Old, MD

## 2020-03-18 NOTE — Progress Notes (Signed)
Have you seen any specialists/other providers since your last visit with us?    Yes    Arm preference verified?   Yes    The patient is due for shingles vaccine and Advance Directive

## 2020-03-19 ENCOUNTER — Encounter (INDEPENDENT_AMBULATORY_CARE_PROVIDER_SITE_OTHER): Payer: Self-pay | Admitting: Internal Medicine

## 2020-03-30 ENCOUNTER — Encounter (INDEPENDENT_AMBULATORY_CARE_PROVIDER_SITE_OTHER): Payer: Self-pay

## 2020-03-31 ENCOUNTER — Encounter (INDEPENDENT_AMBULATORY_CARE_PROVIDER_SITE_OTHER): Payer: Self-pay

## 2020-04-01 ENCOUNTER — Other Ambulatory Visit (HOSPITAL_BASED_OUTPATIENT_CLINIC_OR_DEPARTMENT_OTHER): Payer: Self-pay

## 2020-04-01 ENCOUNTER — Other Ambulatory Visit: Payer: Self-pay | Admitting: Surgery

## 2020-04-01 ENCOUNTER — Telehealth (HOSPITAL_BASED_OUTPATIENT_CLINIC_OR_DEPARTMENT_OTHER): Payer: Medicare Other | Admitting: Registered"

## 2020-04-01 ENCOUNTER — Telehealth (HOSPITAL_BASED_OUTPATIENT_CLINIC_OR_DEPARTMENT_OTHER): Payer: Self-pay | Admitting: Registered"

## 2020-04-01 DIAGNOSIS — L4050 Arthropathic psoriasis, unspecified: Secondary | ICD-10-CM

## 2020-04-01 DIAGNOSIS — I10 Essential (primary) hypertension: Secondary | ICD-10-CM

## 2020-04-01 DIAGNOSIS — E782 Mixed hyperlipidemia: Secondary | ICD-10-CM

## 2020-04-06 ENCOUNTER — Encounter (INDEPENDENT_AMBULATORY_CARE_PROVIDER_SITE_OTHER): Payer: Self-pay

## 2020-04-07 ENCOUNTER — Telehealth (INDEPENDENT_AMBULATORY_CARE_PROVIDER_SITE_OTHER): Payer: Self-pay

## 2020-04-07 NOTE — Progress Notes (Signed)
Pre Op #1    Went over strength training and aerobic training with patient.  Prescribed exercise recommendations prior to surgery.  Went over target heart rate zone and RPE scale to measure exercise. Talked about how many steps they should try and reach each day.   Went into detail and gave handouts on all information    Went over post op exercise guidelines and answered all questions.    30 minutes was spent educating patient     Exercise and Physical activity Goals:  1.Walking consistently   2. Get started with dance exercise

## 2020-04-08 ENCOUNTER — Encounter: Payer: Self-pay | Admitting: Internal Medicine

## 2020-04-14 ENCOUNTER — Telehealth (INDEPENDENT_AMBULATORY_CARE_PROVIDER_SITE_OTHER): Payer: Self-pay | Admitting: Registered"

## 2020-04-14 ENCOUNTER — Encounter (INDEPENDENT_AMBULATORY_CARE_PROVIDER_SITE_OTHER): Payer: Self-pay | Admitting: Registered"

## 2020-04-14 DIAGNOSIS — Z713 Dietary counseling and surveillance: Secondary | ICD-10-CM

## 2020-04-14 NOTE — Progress Notes (Signed)
Verbal consent has been obtained from the patient to conduct a video visit encounter to minimize exposure to COVID-19: YES    S:   Nonie Bryndle Corredor is a pleasant 69 y.o. female with morbid obesity presenting for initial nutrition evaluation for possible bariatric surgery. Pt interested in SG surgery.    Pt stated reason for WLS: has been thinking about WLS for 7-8 years due to "yo-yo" with her diet.  Has arthritis.    Weight Hx: Pt has struggled with weight after her hysterectomy in 1999.  Pt has tried the following diet and/or exercise programs:  Going to the gym, [employer], fasting, "you name them, I've been on them."    Lost the most weight with [employer] at 20 lbs.  Pt reports lowest adult weight maintained was 130lbs in her early 43s; in her 40s maintained 210-230lbs.  Pt reports their highest adult weight as current. Gained about 15lbs since the onset of pandemic.    Lifestyle/Activity Level/Exercise:    Daily activity: tries not to sit all day.  Structured exercise: walks in the parking lot or around the track at her local middle school. Plans to add an exercise video to her routine. Barriers to physical activity/exercise:  Has been reluctant to go to the gym since the pandemic. Has psoriatic arthritis.    Social History: Lives alone. Daughter and granddaughter live in [place]. 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 Food insecurity concerns: none. Does try to buy sale items in bulk and freeze  Daughter is very supportive; has two friends who have had the procedure, and very open to talking about their procedures.  Enjoys genealogy.     Diet Overview:    Food Recall:     Breakfast: no later than 9am, [place] bacon and oatmeal (almonds, granola), an apple or mixed fruit, or yogurt w/granola.      Lunch: 1130/2pm chicken over a salad or a can of soup, pork and beans, or tuna salad sandwiches, grilled cheese.     Dinner: 4/6pm salmon, chicken (thighs, legs,  breast, or ground) [place] (such as in a spaghetti sauce), collard greens or kale 3x/wk, canned peas and carrots, sides of potatoes sometimes with sour cream, dinner rolls. Occasional fried catfish and pork.      Snacks: occasional cookies (oatmeal, Oreo), or a slice of cheesecake. Sometimes jelly beans or Dots once a week. Snacks in evenings three days a week.     Beverages: coffee w/sweetened creamer 1-2 cups, orange juice 2-3 days a week with a meal, lemonade at lunch or dinner. Not typically carbonated drinks. Four to five eight ounce glasses of water. Trying to drink more water at encouragement of Urologist.      Alcohol: glass of red wine once a week.       Food prepared outside home: does not like to dine out.      Other pertinent dietary comments: prefers fried seafood. Chooses high fiber bread.    Psychosocial History:  Pt is able to identify the following emotions/situations that trigger them to eat:  She is not sure, states "it has to be something." states a visual such as a cooking show may make her crave something.     O:  Ht:    Ht Readings from Last 1 Encounters:   03/16/20 5\' 7"      Wt:   249lbs BMI:  There is no height or weight on file to calculate  BMI.   IBW:  148 Excess Wt:  101lbs ABW:  179lbs    Nutritionally pertinent labs: n/a    Vitamin/Mineral Deficiency Hx:  Anemia only as a teen  Pt is currently taking the following OTC supplements:  CBD, hair/skin/nails vitamin, vit D/E/Zn .    Allergies:  No Known Allergies    Smoking:   Social History     Tobacco Use   Smoking Status Never Smoker   Smokeless Tobacco Former User       Comorbidities:    Patient Active Problem List   Diagnosis   . Essential hypertension   . Mixed hyperlipidemia   . Pain in both hands   . Pain in both feet   . Arthralgia, unspecified joint   . Chronic pain of both ankles   . Psoriatic arthritis   . Severe obesity (BMI 35.0-39.9) with comorbidity   . Gastroesophageal reflux disease       A:   Nutrition Diagnosis: obesity  related to a history of excessive energy intake, limited physical activity, knowledge deficit as evidenced by report, dietary recall, and BMI.       Per food recall, pt to benefit from reducing sweetened beverages and sugary snacks, increasing exercise.   Per pt insurance, pt is required to complete 3 months of pre-surgery weight loss classes, and classes will help to strengthen knowledge deficit and post-operative weight loss management.  Pt comprehension level good, and anticipated compliance good.  Pt receives my recommendation as a good candidate for bariatric surgery.  I recommend this pt for WLS.    P:    We discussed nutritional expectations prior to bariatric surgery including insurance-required goals and reduced surgical risks associated with wt loss prior to surgery.  Pt was provided with an overview of pre-op and post-op dietary stages as well as lifelong vitamin/mineral supplementation requirements.  We discussed the value of implementing lifestyle changes prior to surgery, measuring portions to increase awareness.  We also discussed the importance of consistent intake, appropriate protein intake and long-term commitment to physical activity.  Initial goals:    1.  Pt to continue with current weight loss program as required by insurance company.    2.  Pt to review nutrition plan post - operatively and contact RD with questions.  Contact information provided.      Pt verbalized understanding and did not voice any additional questions.  Spent a total of 60 minutes educating pt in a individual one-on-one setting.  Plan reviewed with surgeon.

## 2020-04-28 ENCOUNTER — Encounter (HOSPITAL_BASED_OUTPATIENT_CLINIC_OR_DEPARTMENT_OTHER): Payer: Self-pay

## 2020-04-29 ENCOUNTER — Encounter (INDEPENDENT_AMBULATORY_CARE_PROVIDER_SITE_OTHER): Payer: Self-pay

## 2020-04-30 ENCOUNTER — Encounter (INDEPENDENT_AMBULATORY_CARE_PROVIDER_SITE_OTHER): Payer: Self-pay

## 2020-05-04 ENCOUNTER — Telehealth (HOSPITAL_BASED_OUTPATIENT_CLINIC_OR_DEPARTMENT_OTHER): Payer: Self-pay | Admitting: Registered"

## 2020-05-05 ENCOUNTER — Encounter (HOSPITAL_BASED_OUTPATIENT_CLINIC_OR_DEPARTMENT_OTHER): Payer: Self-pay

## 2020-05-05 ENCOUNTER — Telehealth (INDEPENDENT_AMBULATORY_CARE_PROVIDER_SITE_OTHER): Payer: Self-pay

## 2020-05-05 NOTE — Progress Notes (Signed)
Entire Session was conducted over [employer] due COVID-19 Epidemic.  Consent to receive outpatient mental health services was sent to patient ahead of time and patient confirmed they read it  and patient provided verbal consent.    Initial behavioral consult complete. Patient is a good candidate for surgery.  Demonstrated a good understanding of WLS and required lifestyle changes.      Pt is a divorced (1985) single woman living independently. She has one adult daughter living about 45 minutes away and one 34 year old granddaughter. Pt reported that family is very supportive of the decision to have weight loss surgery. 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  Pt said that she began struggling with her weight about 20 years ago. Pt reported trying several diet and exercise programs without achieving long-term weight loss success. Discussed behaviors, times of days, people, foods that contribute to her inability to manage her eating and discussed strategies, such as abstinence, to improve. Pt seemed receptive.  Pt denied hx of mental health or substance use. Denied hx of eating disorder. Pt. Is a nonsmoker.  Reported about 1-2 glasses of wine per month. The negative consequences of alcohol post-surgery were clearly communicated to pt and pt demonstrated an adequate understanding, as well as expressed a verbal commitment to eliminate alcohol post-surgery.     Name: Barbara Rubio  Date of Birth: 1951/01/10   Age: 69 y.o.   Gender: Female   Examiner: Galen Daft, Ph.D.  Date of Evaluation: 05/05/20  Assessment: Clinical Interview, Mental Health Assessment Questionnaire     Purpose of Assessment:    To determine, within the limits of psychological certainty; whether Barbara Rubio is comfortable with her decision to proceed with bariatric surgery; whether Barbara Rubio is prepared for and committed to the required lifestyle changes,  has adequate social supports, and whether Barbara Rubio has sufficient information about bariatric surgery on which to base informed consent.    Introduction:     Barbara Rubio is a 69 y.o. year old female seeking psychological evaluation for weight loss surgery.  Barbara Rubio's current understanding of the surgical process, pre and post-surgical requirements, behavioral changes, and long-term consequences is good.  Barbara Rubio appeard to be emotionally stable and cognitively able to understand and consent to surgery.    Weight History:    Barbara Rubio reported struggling with her weight for years. She reported trying multiple diet and exercise programs without achieving long term success. Barbara Rubio reported feeling she has exhausted her options for independent weight loss and needs the assistance of surgery in order to improve her health, quality and and quantity of life.     Tobacco Use:  The Patient denies current tobacco use.    Recommendation for Surgery:    Barbara Rubio does not appear to be experiencing any psychological distress at this time.  She is not showing any major symptoms of depression or anxiety and there are no concerns regarding her safety.  She denied history of or current suicidal ideation or attempts.  No substance abuse or mental health issues were noted.  Clinical data suggest that she is likely to be compliant with the instructions provided by her health care team and that she has sufficient information on which to base her decision to move forward.  After reviewing her profile and discussing her psychosocial history, she appears to have the tools  required to handle the Bariatric surgery process; however she is encouraged to engage in supportive group activities and individual counseling, should the need arise.    Behavioral Observations:    Barbara Rubio arrived in a timely manner and was alert, oriented in  all spheres with a calm affect.  She was cooperative and demonstrated a positive attitude towards improving her health outcomes.      Submitted by:    Galen Daft, Ph.D.

## 2020-05-06 ENCOUNTER — Telehealth (INDEPENDENT_AMBULATORY_CARE_PROVIDER_SITE_OTHER): Payer: Self-pay | Admitting: Registered"

## 2020-05-10 ENCOUNTER — Telehealth (INDEPENDENT_AMBULATORY_CARE_PROVIDER_SITE_OTHER): Payer: Medicare Other | Admitting: Neurology

## 2020-05-10 ENCOUNTER — Telehealth: Payer: Self-pay

## 2020-05-10 ENCOUNTER — Encounter (INDEPENDENT_AMBULATORY_CARE_PROVIDER_SITE_OTHER): Payer: Self-pay | Admitting: Neurology

## 2020-05-10 ENCOUNTER — Encounter: Payer: Self-pay | Admitting: Internal Medicine

## 2020-05-10 VITALS — Ht 67.0 in | Wt 250.0 lb

## 2020-05-10 DIAGNOSIS — G4710 Hypersomnia, unspecified: Secondary | ICD-10-CM

## 2020-05-10 DIAGNOSIS — G4719 Other hypersomnia: Secondary | ICD-10-CM

## 2020-05-10 NOTE — Progress Notes (Signed)
Neurology Consult Note    Patient Name:  Barbara Rubio, Barbara Rubio  Date:  05/10/2020     Consult requested by Morrison Old, MD and note sent to the doctor.    CC:  Sleep issues    HPI:  This is a video/telephone conference due to the COVID-19 pandemic. Verbal consent has been obtained from the patient to conduct a video/telephone visit encounter to minimize exposure to COVID-19.      She is planning on bariatric surgery, and needs sleep evaluation.     She goes to bed at 8 pm and falls asleep in a variable amount of time. She snores. She wakes up once during the night. She may sleep until 8 am, or sometimes at around 4 am. First thing in the morning she feels variable, sometimes tired. She drinks 1-2 cups of coffee.     There is variable daytime hypersomnia, usually if she is inactive. The patient complains of low energy levels, better if she is active. There is reduced concentration and attention. The patient does not dose off during the day if alone or inactive. She tends to stay active. She has rather good short term memory. The situation is worse if there is not enough sleep.    There have been no recent head injuries, trauma or motor vehicle accidents. There is no problem with balance or coordination. There have been no falls. There is no focal weakness or numbness. She is not having headaches.          The following portions of the patient's history were reviewed and updated as appropriate: allergies, current medications, past family history, past medical history, past social history, past surgical history and problem list.    The patient denies loss of consciousness or syncope. There is no chest pain or shortness of breath. All systems were reviewed and were negative except as described in the HPI.    Active Ambulatory Problems     Diagnosis Date Noted    Essential hypertension     Mixed hyperlipidemia     Pain in both hands 04/05/2018    Pain in both feet 04/05/2018    Arthralgia, unspecified joint  04/05/2018    Chronic pain of both ankles 04/05/2018    Psoriatic arthritis 01/27/2020    Severe obesity (BMI 35.0-39.9) with comorbidity 02/27/2020    Gastroesophageal reflux disease 03/16/2020     Resolved Ambulatory Problems     Diagnosis Date Noted    No Resolved Ambulatory Problems     Past Medical History:   Diagnosis Date    Hyperlipidemia     Hypertension     Left wrist fracture 2018    Urinary incontinence 12/21/2017     Patient Active Problem List   Diagnosis    Essential hypertension    Mixed hyperlipidemia    Pain in both hands    Pain in both feet    Arthralgia, unspecified joint    Chronic pain of both ankles    Psoriatic arthritis    Severe obesity (BMI 35.0-39.9) with comorbidity    Gastroesophageal reflux disease           Exam     Ht 1.702 m (5\' 7" )    Wt 113.4 kg (250 lb)    BMI 39.16 kg/m   General: WDWN, NAD.   HEENT: No nasal discharge, no obvious oral masses. No icterus or conjunctival hemorrhage noted.  Neck: Full ROM. No neck masses noted.  Extremities: No edema noted in the hands.  Skin: No obvious rashes or discoloration.  Chest:  No audible wheezing or dyspnea at rest.  Abdomen: No distention, no tenderness or guarding to patient palpation.  Psychiatric: Cooperative with exam, normal affect. Thought process and speech pattern normal.    NEUROLOGICAL EXAM    Mental Status: Awake, alert, oriented. Speech fluent without aphasia or dysarthria. Follows commands well. Attention, memory, and concentration intact. Fund of knowledge appropriate for education.     Cranial Nerves: Pupils equally round. Extraocular movements are full. No nystagmus seen. Facial sensation to light touch appears intact. Face is symmetric. Hearing is intact to conversational speech. Tongue protrudes midline. Cranial nerves II-XII otherwise appears intact.     Motor: Moves all extremities well, without UE drift. No obvious focal weakness noted. Bulk appears normal. No tremor noted.    Sensation: Light  touch intact.     Coordination: Finger to nose intact without dysmetria.     Gait: Normal and steady.         Assessment:    1)  Daytime hypersomnia of unclear etiology. This is a chronic condition which negatively impacts quality of life and also increases risk of comorbidities including HTN and DM. There is increased risk of motor vehicle accidents and possible injury to self and others.  2)  HTN/HLD, the former possibly related to #1        Plan:    1)  Overnight polysomnogram  2)  We discussed sleep hygiene   3)  Obesity     Follow up in 6 weeks    Time spent 45 minutes    Layson Bertsch B. Lance Coon, MD  [facility]  Associate Professor of Neurology  [facility], [place]  [phone] [place] office  [phone] [place] office

## 2020-05-10 NOTE — Pre-Procedure Instructions (Signed)
SPECIAL INSTRUCTIONS FOR THE [facility] NAVIGATORS:  CONTACT ISOLATION:     TRANSLATOR REQUESTED:    SPECIAL INSTRUCTIONS:    TRANSPORTATION:  DAUGHTER WILL COME IN WITH PT      TESTING - DAY OF SURGERY:    PREGNANCY TEST WAIVERS:    ABNORMAL LABS:        SPECIAL CONSIDERATIONS-BLOOD PRODUCTS:    SPECIAL NEEDS FOR PATIENT - DAUGHTER WILL STAY WITH PT OVERNIGHT AFTER SURGERY     PLEASE DO NOT REPLY TO THIS EMAIL. IF YOU HAVE ANY QUESTIONS, PLEASE CONTACT THE PREOP INTERVIEW OFFICE AT [phone] OR [phone]) [phone]   MONDAY - FRIDAY.      Date of Procedure: 05/18/20    Arrival Time: 10:15 AM     Procedure Time: 11:45 AM      Someone from the hospital will call you after 4pm the business day prior to your scheduled procedure to verify the arrival and procedure times listed above. The purpose of this call is to advise you of any scheduling changes that may have occurred since the time of your telephone interview with the pre surgical services nurse.    NOTE FROM THE ANESTHESIA DEPARTMENT:  Please note that your procedure may be cancelled or rescheduled if you do not complete the required testing, preop clearances and/or follow the medication instructions that are required by your surgeon and /or the anesthesia department.    Requirements marked with an "X" below (i.e.: testing, etc.) Must be completed prior to your procedure :    ( X )  EGD / COLONOSCOPY PROCEDURES:  PLEASE FOLLOW THE DIETARY GUIDELINES AND/OR BOWEL PREP INSTRUCTIONS PROVIDED BY YOUR SURGEON'S OFFICE.     Medication Instructions:  ATORVASTATIN, LISINOPRIL, TOVIAZ  TAKE PM BEFORE SURGERY AND TAKE ON REGULAR SCHEDULE UNTIL THEN.     ESTRADIOL CREAM  CONTINUE ON SCHEDULE    STOP HERBAL AND GREEN TEA 05/11/20    YOU SHOULD NOT TAKE IBUPROFEN, MOTRIN, ADVIL, ALEVE, ASPIRIN, NSAIDS AS OF 05/11/20 .     YOU MAY TAKE TYLENOL AS NEEDED.    Hospital Address and Arrival Instructions:   [address], [place], [place] [address]  The [facility] is where  you will enter.  Please call [phone] morning of surgery if you need assistance upon arrival.    Upon arrival in the hospital main lobby via [employer], you will proceed to:  - Patient Registration, which is all the way down the hall on the left.  - Once registration is completed, you will be escorted by the registration staff to the Surgical Services Waiting Area, where you wait until a PreOp clinical team member escorts you to a room and initiates the PreOp process.  - You will need to have a valid photo I.D., insurance card and co-pay form of payment if required by insurance.    Pre Surgical General Instructions:  1. Bathe or shower the morning of the procedure with an anti-bacterial soap before arriving (unless instructed to use Chlorhexidine (CHG) or Hibiclens).   2. Do not apply lotion, perfume, cologne, or hair-care products such as hair spray or gels.  3. Do not shave your surgical site at home.  4. Do not wear makeup, jewelry including body piercing, watches, earrings, or rings.   5. You may brush your teeth and gargle on the morning of surgery but do not swallow any water.  6. Wear casual, loose fitting and comfortable clothing. A gown will be provided.  7. Plan  to leave unnecessary valuables, credit cards (except for co-pay payment use) and jewelry at home or with a companion on day of surgery for safe keeping. The hospital is not responsible for lost/stolen items.  8. If you wear contacts please leave them at home. If you wear glasses, please bring a case.  9. Dentures - we will provide a container  10. Hearing aids, you may bring dos but you will be asked to remove them before surgery.  11. Please arrange for someone to drive you home. For your safety you will not be allowed to drive home after sedation or anesthesia. A responsible adult must be present to accompany you home when you are ready to leave. We strongly recommend that all patients have an adult at home with them for the first 24  hours after surgery.  12. Notify your doctor if you develop any sign of illness before the date of your surgery. Report symptoms such as: high fever, sore throat, or other infection, breathing difficulties or chest pain.  13. Discontinue herbal supplements and herbal/green tea one week prior to surgery.    Below is a link/web address to the Preparing for Your Procedure video that walks you through the surgical experience.   [email or web address]    Possible Anesthesia Side Effects:   Nausea and vomiting. This common side effect usually occurs immediately after the procedure, but some people may continue to feel sick for a day or two. Anti-nausea medicines can help.   Dry mouth. You may feel parched when you wake up. As long as you're not too nauseated, sipping water can help take care of your dry mouth.   Sore throat or hoarseness. The tube put in your throat to help you breathe during surgery can leave you with a sore throat after it's removed.   Chills and shivering. It's common for your body temperature to drop during general anesthesia. Your doctors and nurses will make sure your temperature doesn't fall too much during surgery, but you may wake up shivering and feeling cold. Your chills may last for a few minutes to hours.   Confusion and fuzzy thinking. When first waking from anesthesia, you may feel confused, drowsy, and foggy. This usually lasts for just a few hours, but for some people -- especially older adults -- confusion can last for days or weeks.   Muscle aches. The drugs used to relax your muscles during surgery can cause soreness afterward.   Itching. If narcotic (opioid) medications are used during or after your operation, you may be itchy. This is a common side effect of this class of drugs.   Bladder problems. You may have difficulty passing urine for a short time after general anesthesia.   Dizziness. You may feel dizzy when you first stand up. Drinking plenty of  fluids should help you feel better.

## 2020-05-13 ENCOUNTER — Ambulatory Visit (INDEPENDENT_AMBULATORY_CARE_PROVIDER_SITE_OTHER): Payer: Medicare Other | Admitting: Internal Medicine

## 2020-05-13 ENCOUNTER — Encounter (INDEPENDENT_AMBULATORY_CARE_PROVIDER_SITE_OTHER): Payer: Self-pay | Admitting: Internal Medicine

## 2020-05-13 VITALS — BP 129/77 | HR 79 | Temp 97.5°F | Wt 245.0 lb

## 2020-05-13 DIAGNOSIS — Z01818 Encounter for other preprocedural examination: Secondary | ICD-10-CM

## 2020-05-13 DIAGNOSIS — K219 Gastro-esophageal reflux disease without esophagitis: Secondary | ICD-10-CM

## 2020-05-13 DIAGNOSIS — E782 Mixed hyperlipidemia: Secondary | ICD-10-CM

## 2020-05-13 DIAGNOSIS — I10 Essential (primary) hypertension: Secondary | ICD-10-CM

## 2020-05-13 LAB — CBC AND DIFFERENTIAL
Absolute NRBC: 0 10*3/uL (ref 0.00–0.00)
Basophils Absolute Automated: 0.03 10*3/uL (ref 0.00–0.08)
Basophils Automated: 0.6 %
Eosinophils Absolute Automated: 0.06 10*3/uL (ref 0.00–0.44)
Eosinophils Automated: 1.2 %
Hematocrit: 42.6 % (ref 34.7–43.7)
Hgb: 13 g/dL (ref 11.4–14.8)
Immature Granulocytes Absolute: 0.02 10*3/uL (ref 0.00–0.07)
Immature Granulocytes: 0.4 %
Lymphocytes Absolute Automated: 2.17 10*3/uL (ref 0.42–3.22)
Lymphocytes Automated: 43.8 %
MCH: 27.2 pg (ref 25.1–33.5)
MCHC: 30.5 g/dL — ABNORMAL LOW (ref 31.5–35.8)
MCV: 89.1 fL (ref 78.0–96.0)
MPV: 13.6 fL — ABNORMAL HIGH (ref 8.9–12.5)
Monocytes Absolute Automated: 0.36 10*3/uL (ref 0.21–0.85)
Monocytes: 7.3 %
Neutrophils Absolute: 2.31 10*3/uL (ref 1.10–6.33)
Neutrophils: 46.7 %
Nucleated RBC: 0 /100 WBC (ref 0.0–0.0)
Platelets: 165 10*3/uL (ref 142–346)
RBC: 4.78 10*6/uL (ref 3.90–5.10)
RDW: 14 % (ref 11–15)
WBC: 4.95 10*3/uL (ref 3.10–9.50)

## 2020-05-13 NOTE — Progress Notes (Signed)
Date: 05/13/2020 3:27 PM   Patient ID: Barbara Rubio is a 69 y.o. female.         Have you seen any specialists/other providers since your last visit with [place]?    Yes    Arm preference verified?   Yes    The patient is due for shingles vaccine and Advance Directive    Subjective:     Chief Complaint:  Chief Complaint   Patient presents with    Pre-op Exam     CBC, CMP, EKG(Done 01/27/20)       HPI:  Visit Type: Pre-operative Evaluation  Procedure: Endoscopy  Date of Surgery: 05/18/20  Surgeon: Sabas Sous, MD  Fax Number (Required): [phone], [phone]  Reason for Surgery: Preventive  Recent Health (admits): no current complaints  Recent Health (denies): fever, fatigue, chest pain, cough, nausea, vomiting, diarrhea, dyspnea, dysuria, urinary frequency, abdominal pain, easy bruising, LE swelling and poor exercise tolerance  ---------------------------------------  Exercise Tolerance: 4 met (i.e. climbing stairs )  Surgical Risk Factors: No active cardiac or  pulmonary conditions  Prior Anesthesia: Patient reports no adverse reaction to anesthesia in the past.     Hypertension   This is a chronic problem. The problem is unchanged. The problem is controlled. . Pertinent negatives include no anxiety, blurred vision, chest pain, headaches, malaise/fatigue, neck pain, orthopnea, palpitations, peripheral edema, PND or sweats.  There are no compliance problems.    Hyperlipidemia   This is a chronic problem. The problem is controlled. Pertinent negatives include no chest pain, leg pain or myalgias. Current antihyperlipidemic treatment includes statins. There are no compliance problems.  Risk factors for coronary artery disease include hypertension.  Problem List:  Patient Active Problem List   Diagnosis    Essential hypertension    Mixed hyperlipidemia    Pain in both hands    Pain in both feet    Arthralgia, unspecified joint    Chronic pain of both ankles    Psoriatic arthritis    Severe obesity  (BMI 35.0-39.9) with comorbidity    Gastroesophageal reflux disease       Current Medications:  Outpatient Medications Marked as Taking for the 05/13/20 encounter (Office Visit) with Morrison Old, MD   Medication Sig Dispense Refill    atorvastatin (LIPITOR) 10 MG tablet TAKE 1 TABLET BY MOUTH EVERY DAY AT NIGHT 90 tablet 1    estradiol (ESTRACE) 0.1 MG/GM vaginal cream TWICE A WEEK        lisinopril (ZESTRIL) 2.5 MG tablet Take 1 tablet (2.5 mg total) by mouth daily 30 tablet 5    lisinopril (ZESTRIL) 5 MG tablet TAKE 1 TABLET BY MOUTH EVERY DAY (Patient taking differently: 5 mg every evening   ) 90 tablet 1       Allergies:  No Known Allergies    Past Medical History:  Past Medical History:   Diagnosis Date    Gastroesophageal reflux disease 10/23/2013    Acid reflux alot after meals. TUMS, PRILOSEC     Hyperlipidemia     Hypertension     Left wrist fracture 2018    Psoriatic arthritis     LOWER BACK, LEFT HAND AND FINGERS     Urinary incontinence 12/21/2017    Over active bladder. TOVIAZ HELPS        Past Surgical History:  Past Surgical History:   Procedure Laterality Date    BUNIONECTOMY Left     COLONOSCOPY      X 2  HYSTERECTOMY       partial       Family History:  Family History   Problem Relation Age of Onset    Hypertension Mother     Hypertension Father     Hypertension Sister     Hypertension Brother     Migraines Neg Hx     Stroke Neg Hx     Seizures Neg Hx        Social History:  Social History     Tobacco Use    Smoking status: Never Smoker    Smokeless tobacco: Never Used    Tobacco comment: Never smoked or used tobacco   Vaping Use    Vaping Use: Never used   Substance Use Topics    Alcohol use: Not on file     Comment:  once a month     Drug use: Not on file       The following sections were reviewed this encounter by the provider:   Tobacco   Allergies   Meds   Problems   Med Hx   Surg Hx   Fam Hx          Vitals:  BP 129/77 (BP Site: Right arm, Patient Position:  Sitting, Cuff Size: Large)    Pulse 79    Temp 97.5 F (36.4 C) (Temporal)    Wt 111.1 kg (245 lb)    SpO2 96%    BMI 38.37 kg/m        ROS:  General/Constitutional:   Denies Chills.  Denies Fever.   Ophthalmologic:   Denies Blurred vision.   ENT:   Denies Nasal Discharge. Denies Sinus pain. Denies Sore throat.   Respiratory:   Denies Cough. Denies Shortness of breath. Denies Wheezing.   Cardiovascular:   Denies Chest pain. Denies Chest pain with exertion. Denies Palpitations. Denies  Swelling in hands/feet.   Gastrointestinal:   Denies Abdominal pain. Denies Constipation. Denies Diarrhea. Denies Nausea. Denies  Vomiting.   Skin:   Denies Rash.   Neurologic:   Denies Dizziness. Denies Headache. Denies Tingling/Numbness.        Objective:       Physical Exam:  General Examination:   GENERAL APPEARANCE: alert, in no acute distress, well developed, well nourished,  oriented to time, place, and person.    NECK/THYROID: neck supple, no carotid bruit, carotid pulse 2+ bilaterally, no cervical  lymphadenopathy, no neck mass palpated, no jugular venous distention, no  thyromegaly.   HEART: S1, S2 normal, no murmurs, rubs, gallops, regular rate and rhythm.   LUNGS: normal effort / no distress, normal breath sounds, clear to auscultation  bilaterally, no wheezes, rales, rhonchi.   EXTREMITIES: no clubbing, cyanosis, or edema bilaterally.   PERIPHERAL PULSES: 2+ dorsalis pedis, 2+ posterior tibial bilaterally.   NEUROLOGIC: alert and oriented.         Assessment/Plan:       1. Preoperative examination  - Comprehensive metabolic panel  - CBC and differential  -I would place this patient at no higher risk for the planned procedure than others in his/her age group and I would suggest no further testing prior to the planned procedure.       2. Gastroesophageal reflux disease, unspecified whether esophagitis present  Continue lifestyle and dietary changes  3. Essential hypertension  - Comprehensive metabolic panel  Pt stable on  current medication regimen, no concerns. Continue current medication    4. Mixed hyperlipidemia  - Comprehensive metabolic panel  -  Lipid panel    5. Severe obesity (BMI 35.0-39.9) with comorbidity  HIgh BMI Follow Up   BMI Follow Up Care Plan Documented   Encouragement to Exercise          Morrison Old, MD

## 2020-05-14 ENCOUNTER — Other Ambulatory Visit: Payer: Self-pay

## 2020-05-14 DIAGNOSIS — G4733 Obstructive sleep apnea (adult) (pediatric): Secondary | ICD-10-CM

## 2020-05-14 LAB — COMPREHENSIVE METABOLIC PANEL
ALT: 16 U/L (ref 0–55)
AST (SGOT): 19 U/L (ref 5–34)
Albumin/Globulin Ratio: 1.2 (ref 0.9–2.2)
Albumin: 4 g/dL (ref 3.5–5.0)
Alkaline Phosphatase: 100 U/L (ref 37–106)
Anion Gap: 12 (ref 5.0–15.0)
BUN: 13 mg/dL (ref 7.0–19.0)
Bilirubin, Total: 0.7 mg/dL (ref 0.2–1.2)
CO2: 23 mEq/L (ref 21–29)
Calcium: 10 mg/dL (ref 8.5–10.5)
Chloride: 108 mEq/L (ref 100–111)
Creatinine: 1 mg/dL (ref 0.4–1.5)
Globulin: 3.4 g/dL (ref 2.0–3.7)
Glucose: 87 mg/dL (ref 70–100)
Potassium: 4.4 mEq/L (ref 3.5–5.1)
Protein, Total: 7.4 g/dL (ref 6.0–8.3)
Sodium: 143 mEq/L (ref 136–145)

## 2020-05-14 LAB — LIPID PANEL
Cholesterol / HDL Ratio: 3.4
Cholesterol: 216 mg/dL — ABNORMAL HIGH (ref 0–199)
HDL: 63 mg/dL (ref 40–9999)
LDL Calculated: 132 mg/dL — ABNORMAL HIGH (ref 0–99)
Triglycerides: 106 mg/dL (ref 34–149)
VLDL Calculated: 21 mg/dL (ref 10–40)

## 2020-05-14 LAB — HEMOLYSIS INDEX: Hemolysis Index: 12 (ref 0–18)

## 2020-05-14 LAB — GFR: EGFR: 55.2

## 2020-05-15 ENCOUNTER — Encounter (INDEPENDENT_AMBULATORY_CARE_PROVIDER_SITE_OTHER): Payer: Self-pay | Admitting: Internal Medicine

## 2020-05-17 NOTE — Anesthesia Preprocedure Evaluation (Addendum)
Anesthesia Evaluation    AIRWAY    Mallampati: III    TM distance: >3 FB  Neck ROM: full  Mouth Opening:full   CARDIOVASCULAR    cardiovascular exam normal       DENTAL    no notable dental hx     PULMONARY    pulmonary exam normal and clear to auscultation     OTHER FINDINGS                    (01/27/2020) EKG: NSR, WNL  (05/13/2020) CBC, BMP: Unremarkable  (05/13/2020) Medical pre-op, Dr. Morrison Old: "no higher risk", "no further testing"            Anesthesia Plan    ASA 2     general                             Post op pain management: per surgeon    informed consent obtained                   Signed by: Verdis Prime 05/17/20 9:31 AM

## 2020-05-17 NOTE — Progress Notes (Signed)
Verbal consent has been obtained from the patient to conduct a telephone/video visit encounter to minimize exposure to COVID-19: YES    Nutrition 101    S & O: Patient reports she has lost 5 pounds since the previous class. Patient reports she has met with previous class goals.  Pt actively participated in group activity and was pleased with weight this visit.  Pt continues to be interested in [facility] to improve co-morbid conditions.    Previous Wt:   Wt Readings from Last 10 Encounters:   05/13/20 245 lb   05/10/20 250 lb   05/17/20 258 lb   03/18/20 249 lb   03/16/20 248 lb   02/27/20 245 lb 9.6 oz   01/27/20 244 lb 12.8 oz   12/24/19 246 lb   06/19/19 242 lb   05/02/19 246 lb 6.4 oz       Nutrition Assessment and Diagnosis: Pt with the condition of morbid obesity (Body mass index is 40.41 kg/m.) and co-morbidities with ongoing nutrition knowledge deficit as evidenced by initial report;  active participation; weight loss from previous visit.  Patient continues to demonstrate motivation to lose weight and change behaviors. Patient has mild nutrition knowledge deficit as evidenced per initial report and continued elevated BMI. Patient was educated during the group session on calories, nutrient density and how to pair appropriate foods together for meals.  Discussion focused on:     1. Behavior Modification for weight loss  1. Understanding nutrient dense foods and their benefits.  2. Designing an appropriate post-op bariatric plate based on the bariatric food pyramid.  3. Learning how to accurately measure portion sizes and how they affect weight loss and maintenance.    4. Calorie intake and how it effects weight loss.    Materials Provided:  Copy of powerpoint slides and accompanying handout    P.   1. Return in 1 month for additional class [facility] [employer]) with completed homework.    2. Homework included:  Patient to include 2 nutrient dense foods in their diet and begin shaping their meals based on  the "Bariatric Food Pyramid."   3. Pt will con't goal setting per pt report/active previous class participation  and will con't to make small but measureable improvement in meal pattern/ choices/ and exercise habits.      Educated pt for 30 minutes in a group setting.  Plan reviewed with surgeon.

## 2020-05-18 ENCOUNTER — Ambulatory Visit: Payer: Self-pay

## 2020-05-18 ENCOUNTER — Encounter (INDEPENDENT_AMBULATORY_CARE_PROVIDER_SITE_OTHER): Payer: Self-pay | Admitting: Internal Medicine

## 2020-05-18 ENCOUNTER — Encounter
Admission: RE | Disposition: A | Discharge status: Home / Self Care | Payer: Self-pay | Source: Ambulatory Visit | Attending: Internal Medicine

## 2020-05-18 ENCOUNTER — Ambulatory Visit: Payer: Medicare Other | Admitting: Anesthesiology

## 2020-05-18 ENCOUNTER — Encounter: Payer: Self-pay | Admitting: Internal Medicine

## 2020-05-18 ENCOUNTER — Ambulatory Visit
Admission: RE | Admit: 2020-05-18 | Discharge: 2020-05-18 | Disposition: A | Discharge status: Home / Self Care | Payer: Medicare Other | Source: Ambulatory Visit | Attending: Internal Medicine | Admitting: Internal Medicine

## 2020-05-18 DIAGNOSIS — K449 Diaphragmatic hernia without obstruction or gangrene: Secondary | ICD-10-CM | POA: Insufficient documentation

## 2020-05-18 DIAGNOSIS — K2950 Unspecified chronic gastritis without bleeding: Secondary | ICD-10-CM | POA: Insufficient documentation

## 2020-05-18 DIAGNOSIS — K219 Gastro-esophageal reflux disease without esophagitis: Secondary | ICD-10-CM

## 2020-05-18 DIAGNOSIS — E785 Hyperlipidemia, unspecified: Secondary | ICD-10-CM | POA: Insufficient documentation

## 2020-05-18 DIAGNOSIS — K3189 Other diseases of stomach and duodenum: Secondary | ICD-10-CM | POA: Insufficient documentation

## 2020-05-18 DIAGNOSIS — I10 Essential (primary) hypertension: Secondary | ICD-10-CM | POA: Insufficient documentation

## 2020-05-18 DIAGNOSIS — B9681 Helicobacter pylori [H. pylori] as the cause of diseases classified elsewhere: Secondary | ICD-10-CM | POA: Insufficient documentation

## 2020-05-18 DIAGNOSIS — Z6836 Body mass index (BMI) 36.0-36.9, adult: Secondary | ICD-10-CM | POA: Insufficient documentation

## 2020-05-18 DIAGNOSIS — Z01818 Encounter for other preprocedural examination: Secondary | ICD-10-CM | POA: Insufficient documentation

## 2020-05-18 DIAGNOSIS — E669 Obesity, unspecified: Secondary | ICD-10-CM

## 2020-05-18 HISTORY — DX: Arthropathic psoriasis, unspecified: L40.50

## 2020-05-18 HISTORY — PX: EGD: SHX3789

## 2020-05-18 SURGERY — DONT USE, USE 1095-ESOPHAGOGASTRODUODENOSCOPY (EGD), DIAGNOSTIC
Anesthesia: Anesthesia General | Site: Esophagus | Wound class: Clean Contaminated

## 2020-05-18 MED ORDER — PROPOFOL 10 MG/ML IV EMUL (WRAP)
INTRAVENOUS | Status: AC
Start: 2020-05-18 — End: ?
  Filled 2020-05-18: qty 20

## 2020-05-18 MED ORDER — LIDOCAINE HCL 2 % IJ SOLN
INTRAMUSCULAR | Status: AC
Start: 2020-05-18 — End: ?
  Filled 2020-05-18: qty 20

## 2020-05-18 MED ORDER — LIDOCAINE 1% BUFFERED - CNR/OUTSOURCED
0.3000 mL | Freq: Once | INTRAMUSCULAR | Status: AC
Start: 2020-05-18 — End: 2020-05-18
  Administered 2020-05-18: 0.3 mL via INTRADERMAL
  Filled 2020-05-18: qty 1

## 2020-05-18 MED ORDER — PROPOFOL 10 MG/ML IV EMUL (WRAP)
INTRAVENOUS | Status: DC | PRN
Start: 2020-05-18 — End: 2020-05-18
  Administered 2020-05-18: 100 mg via INTRAVENOUS

## 2020-05-18 MED ORDER — PROPOFOL INFUSION 10 MG/ML
INTRAVENOUS | Status: DC | PRN
Start: 2020-05-18 — End: 2020-05-18
  Administered 2020-05-18: 160 ug/kg/min via INTRAVENOUS

## 2020-05-18 MED ORDER — LACTATED RINGERS IV SOLN
INTRAVENOUS | Status: DC
Start: 2020-05-18 — End: 2020-05-18
  Administered 2020-05-18: 1000 mL via INTRAVENOUS

## 2020-05-18 MED ORDER — GLYCOPYRROLATE 0.2 MG/ML IJ SOLN (WRAP)
INTRAMUSCULAR | Status: AC
Start: 2020-05-18 — End: ?
  Filled 2020-05-18: qty 1

## 2020-05-18 MED ORDER — PANTOPRAZOLE SODIUM 40 MG PO TBEC
40.0000 mg | DELAYED_RELEASE_TABLET | Freq: Every day | ORAL | 1 refills | Status: DC
Start: 2020-05-18 — End: 2020-06-08

## 2020-05-18 MED ORDER — LIDOCAINE HCL 2 % IJ SOLN
INTRAMUSCULAR | Status: DC | PRN
Start: 2020-05-18 — End: 2020-05-18
  Administered 2020-05-18: 160 mg

## 2020-05-18 SURGICAL SUPPLY — 116 items
BASIN EME PLS 700ML LF GRAD TRNLU PGMNT (Patient Supply)
BASIN EMESIS 700 ML GRADUATED TRANSLUCENT PIGMENT FREE PLASTIC (Patient Supply) IMPLANT
BLOCK BITE OD20 MM POLYETHYLENE ADULT (Patient Supply) ×1
BLOCK BITE OD20 MM POLYETHYLENE ADULT MOUTHPIECE STRAP RETENTION RIM (Patient Supply) ×1 IMPLANT
BLOCK BITE OD38 FR PEDIATRIC BLOX (Other) IMPLANT
BLOCK BITE PE LG SCPSVR 20MM 27MM LF NS (Patient Supply) ×1
BLOCK BLOX BITE PED 38F (Other)
BRUSH ENDOSCOPIC CLEANING SLIM 2 BUNDLE (Endoscopic Supplies) ×1
BRUSH ENDOSCOPIC CLEANING SLIM 2 BUNDLE CATHETER ROBUST VALVE OD5 MM (Endoscopic Supplies) ×1 IMPLANT
BRUSH ESCP CLN SLIM HEDGEHOG 5MM 2 BNDL (Endoscopic Supplies) ×1
CATHETER BALLOON DILATATION CRE 2.8 MM (Balloons)
CATHETER BALLOON DILATATION CRE PEBAX (Balloons)
CATHETER BALLOON DILATATION L5.5 CM L240 (Balloons)
CATHETER BLNDIL PEBAX CRE 7.5FR 12-15MM (Balloons)
CATHETER BLNDIL PEBAX CRE 7.5FR 15-18MM (Balloons)
CATHETER ELHMST HMGLD GLDPRB 7FR 300CM (Procedure Accessories)
CATHETER ELHMST INJ GLDPRB 7FR 25GA .24 (Catheter Micellaneous)
CATHETER OD10-11-12 MM ODSEC6 FR L180 CM CREâ„¢ BALLOON DILATATION L8 CM (Balloons) IMPLANT
CATHETER OD18-19-20 MM ODSEC6 FR L180 CM CREâ„¢ BALLOON DILATATION L8 CM (Balloons) IMPLANT
CATHETER OD6 FR ODSEC12-13.5-15 MM L180 CM CREâ„¢ BALLOON DILATATION L8 (Balloons) IMPLANT
CATHETER OD6-7-8 MM ODSEC6 FR L180 CM CREâ„¢ BALLOON DILATATION L8 CM (Balloons) IMPLANT
CATHETER OD7 FR L300 CM BIPOLAR ROUND (Procedure Accessories)
CATHETER OD7 FR L300 CM BIPOLAR ROUND DISTAL TIP STANDARD CONNECTOR (Procedure Accessories) IMPLANT
CATHETER OD7 FR ODSEC25 GA ID.24 MM L210 (Catheter Micellaneous)
CATHETER OD7 FR ODSEC25 GA ID.24 MM L210 CM BIPOLAR ROUND DISTAL TIP (Catheter Miscellaneous) IMPLANT
CATHETER OD7.5 FR ODSEC12-15 MM L240 CM CREâ„¢ BALLOON DILATATION L5.5 (Balloons) IMPLANT
CATHETER OD7.5 FR ODSEC15-18 MM L240 CM CREâ„¢ BALLOON DILATATION L5.5 (Balloons) IMPLANT
CATHETER OD8-9-10 MM ODSEC6 FR L180 CM CREâ„¢ BALLOON DILATATION L8 CM (Balloons) IMPLANT
CATHETER OD8-9-10 MM ODSEC7.5 FR L240 CM CREâ„¢ BALLOON DILATATION L5.5 (Balloons) IMPLANT
CONTAINER HISTOLOGY 60 ML 30 ML GRADUATE LEAK RESISTANT O RING PREFILL (Procedure Accessories) ×3 IMPLANT
DEVICE INFL CRE STERI-FLATE DISP (Endoscopic Supplies)
DEVICE INFLATION CRE (Endoscopic Supplies)
DEVICE INFLATION CRE STERI-FLATE (Endoscopic Supplies) IMPLANT
DILATOR ENDOSCOPIC CRE 2.8 MM 3.2 MM (Balloons)
DILATOR ENDOSCOPIC CRE 2.8 MM 3.2 MM PEBAX ESOPHAGEAL PYLORIC COLONIC (Balloons) IMPLANT
DILATOR ENDOSCOPIC CRE 5.5C 240CM 18-19-20MM 7.5FR PEBAX ESOPHAGEAL (Balloons) IMPLANT
DILATOR ENDOSCOPIC CRE 5.5C 240CM 6-7-8MM 7.5FR PEBAX ESOPHAGEAL (Balloons) IMPLANT
DILATOR ENDOSCOPIC CRE PEBAX ESOPHAGEAL (Balloons)
DILATOR ESCP PEBAX 2.8MM 3.2MM CRE (Balloons)
DILATOR ESCP PEBAX 2.8MM 3.2MM CRE 6-7-8 (Balloons)
DILATOR ESCP PEBAX 2.8MM CRE 10-11-12MM (Balloons)
DILATOR ESCP PEBAX 2.8MM CRE 18-19-20MM (Balloons)
DILATOR ESCP PEBAX 2.8MM CRE 6-7-8MM 6FR (Balloons)
DILATOR ESCP PEBAX 2.8MM CRE 8-9-10MM 6 (Balloons)
DILATOR ESCP PEBAX CRE 6FR 12-13.5-15MM (Balloons)
DILATOR ESCP PEBAX CRE 8-9-10MM 7.5FR (Balloons)
ELECTRODE ADULT PATIENT RETURN L9 FT REM POLYHESIVE ACRYLIC FOAM (Procedure Accessories) IMPLANT
ELECTRODE PATIENT RETURN L9 FT VALLEYLAB (Procedure Accessories)
ELECTRODE PT RTN RM PHSV ACRL FM C30- LB (Procedure Accessories)
FORCEPS BIOPSY L240 CM +2.8 MM HOT OD2.2 (Endoscopic Supplies)
FORCEPS BIOPSY L240 CM +2.8 MM HOT OD2.2 MM RADIAL JAW (Endoscopic Supplies) IMPLANT
FORCEPS BIOPSY L240 CM JUMBO MICROMESH (Instrument)
FORCEPS BIOPSY L240 CM JUMBO MICROMESH TEETH STREAMLINE CATHETER (Instrument) IMPLANT
FORCEPS BIOPSY L240 CM LARGE CAPACITY (Instrument)
FORCEPS BIOPSY L240 CM MICROMESH TEETH STREAMLINE CATHETER NEEDLE (Instrument) ×1 IMPLANT
FORCEPS BIOPSY L240 CM STANDARD CAPACITY (Instrument) ×1
FORCEPS BX +2.8MM RJ 4 2.2MM 240CM HOT (Endoscopic Supplies)
FORCEPS BX SS JMB RJ 4 2.8MM 240CM STRL (Instrument)
FORCEPS BX SS LG CPC RJ 4 2.4MM 240CM (Instrument)
FORCEPS BX STD CPC RJ 4 2.2MM 240CM STRL (Instrument) ×1
FORCEPS RADIAL JAW 3 W/ NEEDLE (Endoscopic Supplies) IMPLANT
GLOVE SRG PCP 8 BGL SKNSN INDCTR (Glove) ×1
GLOVE SURGICAL 8 BIOGEL SKINSENSE (Glove) ×1
GLOVE SURGICAL 8 BIOGEL SKINSENSE INDICATOR UNDERGLOVE POWDER FREE (Glove) ×1 IMPLANT
GOWN ISL SMS XL LF HKLP NK KNIT CUF BLU (Patient Supply) ×2
GOWN ISOLATION XL HOOK LOOP NECK KNIT (Patient Supply) ×2 IMPLANT
KIT ENDO W/ FOUR PACK BUTTONS (Kits) ×1
KIT ENDOSCOPIC COMPLIANCE ENDOKIT (Kits) ×1
KIT ENDOSCOPIC COMPLIANCE ENDOKIT ORCAPOD 4 1.1 OZ (Kits) ×1 IMPLANT
LIFTER SRG 10ML ELEVIEW 5X5.1X1.2IN INJ (Endoscopic Supplies) IMPLANT
MANIFOLD SCT 2 STD NPTN 2 LF NS 4 PORT (Filter) ×1
MANIFOLD SUCTION 2 STANDARD 4 PORT (Filter) ×1
MANIFOLD SUCTION 2 STANDARD 4 PORT NEPTUNE 2 WASTE MANAGEMENT SYSTEM (Filter) ×1 IMPLANT
MARKER ENDOSCOPIC PERMANENT INDICATION (Syringes, Needles)
MARKER ENDOSCOPIC PERMANENT INDICATION DARK SYRINGE SPOT EX 5 ML (Syringes, Needles) IMPLANT
MARKER ESCP 5ML SPOT EX PERM INDCT DRK (Syringes, Needles)
NEEDLE SCLEROTHERAPY CARR-LOCKE OD25 GA ODSEC2.5 MM L230 CM INJECTION (Needles) IMPLANT
NEEDLE SCLEROTHERAPY OD25 GA ODSEC2.5 MM (Needles)
NEEDLE SCLRTX SS TFLN CRLK 25GA 2.5MM (Needles)
NET SPEC RTRVL STD RTHNT 2.5MM 230CM LF (Urology Supply)
NET SPECIMEN RETRIEVAL L230 CM STANDARD (Urology Supply)
NET SPECIMEN RETRIEVAL L230 CM STANDARD SHEATH OD2.5 MM L6 CM X W3 CM (Urology Supply) IMPLANT
PAD TRANSPORT L42 IN X W17 IN (Procedure Accessories)
PAD TRANSPORT L42 IN X W17 IN TRANSLUCENT LEAK PROOF ABSORBENT (Procedure Accessories) IMPLANT
PAD TRNSPT CINCHPAD 42X17IN TRNLU LEK (Procedure Accessories)
PROBE COAG FIAPC 6.9FR 7.2FT CRCMF PLG (Endoscopic Supplies)
PROBE COAGULATION L7.2 FT (Endoscopic Supplies)
PROBE COAGULATION L7.2 FT CIRCUMFERENTIAL PLUG PLAY FUNCTIONALITY (Endoscopic Supplies) IMPLANT
PROBE ELECTROSURGICAL L220 CM FLEXIBLE (Procedure Accessories)
PROBE ELECTROSURGICAL L220 CM FLEXIBLE STRAIGHT FIRE OD2.3 MM FIAPC (Procedure Accessories) IMPLANT
PROBE ESURG FIAPC 2.3MM 220CM STRL FLXB (Procedure Accessories)
SNARE 2.8 CM ROUND L240 CM OD10 MM (Endoscopic Supplies)
SNARE 2.8 CM ROUND L240 CM OD10 MM ODSEC2.4 MM CAPTIVATOR LOOP STIFF (Endoscopic Supplies) IMPLANT
SNARE 9 MM L230 CM OD2.4 MM COLD BRAID (Instrument)
SNARE 9 MM L230 CM OD2.4 MM EXACTO COLD BRAID WIRE CLEAN CUT (Instrument) IMPLANT
SNARE ESCP 2.8CM RND CPTVTR II 10MM 2.4 (Endoscopic Supplies)
SNARE ESCP 9MM EXACTO 2.4MM 230CM LF (Instrument)
SNARE ESCP MED OVL CPTFLX 2.4MM 240CM (Endoscopic Supplies)
SNARE ESCP MIC CPTVTR 13MM 240IN STRL (GE Lab Supplies)
SNARE MD OVAL 240CM 2.4 MM CPTFLX LP FLXBL ENDOSCOPIC POLYPECTOMY 27MM (Endoscopic Supplies) IMPLANT
SNARE MEDIUM OVAL L240 CM OD2.4 MM (Endoscopic Supplies)
SNARE SMALL HEXAGON CAPTIVATOR STIFF ENDOSCOPIC POLYPECTOMY (GE Lab Supplies) IMPLANT
SOL FORMALIN 10% PREFILL 30ML (Procedure Accessories) ×6
SPEEDBAND SUPERVIEW SUPER (Endoscopic Supplies) IMPLANT
SPONGE GAUZE L4 IN X W4 IN 4 PLY HIGH (Sponge)
SPONGE GAUZE L4 IN X W4 IN 4 PLY NONWOVEN LINT FREE CURITY RAYON (Sponge) IMPLANT
SPONGE GZE RYN PLSTR CRTY 4X4IN LF NS 4 (Sponge)
SYRINGE 50 ML GRADUATE NONPYROGENIC DEHP (Syringes, Needles)
SYRINGE 50 ML GRADUATE NONPYROGENIC DEHP FREE PVC FREE BD MEDICAL (Syringes, Needles) IMPLANT
SYRINGE MED 50ML LF STRL GRAD N-PYRG (Syringes, Needles)
TRAP MCS PLS LF STRL SCR CAP TUBE ID LBL (Procedure Accessories)
TRAP MUCUS SCREW CAP TUBE ID LABEL (Procedure Accessories)
TRAP MUCUS SCREW CAP TUBE ID LABEL MEDLINE PLASTIC CLEAR (Procedure Accessories) IMPLANT
TUBING CONNECTING STERILE 10FT (Tubing) ×1
TUBING SUCTION ID3/16 IN L10 FT (Tubing) ×1
TUBING SUCTION ID3/16 IN L10 FT NONCONDUCTIVE STRAIGHT MALE FEMALE (Tubing) ×1 IMPLANT

## 2020-05-18 NOTE — Anesthesia Postprocedure Evaluation (Signed)
Anesthesia Post Evaluation    Patient evaluated upon conclusion of intraoperative (or intraprocedural) anesthetic care.   Patient Participation: waiting for patient participation  Level of Consciousness: lethargic  Pain Score: 0  Pain Management: adequate  Airway Patency: patent  Anesthetic complications: No    PONV Status: none  Cardiovascular status: acceptable  Respiratory status: acceptable  Hydration status: acceptable  Please refer to Transfer of Care note for additional details of post-anesthesia disposition.

## 2020-05-18 NOTE — Discharge Instructions (Signed)
Endoscopy Discharge Instructions  General Instructions:  1. Following sedation, your judgement, perception, and coordination are considered impaired. Even though you may feel awake and alert, you are considered legally intoxicated. Therefore, until the next morning;  · Do not Drive  · Do not operate appliances or equipment that requires reaction time (e.g. stove, electrical tools, machinery)  · Do not sign legal documents or be involved in important decisions.  · Do not smoke if alone  · Do not drink alcoholic beverages  · Go directly home and rest for several hours before resuming your routine activities.  · It is highly recommended to have a responsible adult stay with you for the next 24 hours  · If you had biopsies taken: no NSAIDS (Aspirin, Motrin, Aleve, Ibuprofen/Advil, Naproxen) for three days. And hold  blood thinners and restart as directed by your physician.  · Resume your prior medications    2. Tenderness, swelling or pain may occur at the IV site where you received sedation. If you experience this, apply warm soaks to the area. Notify your physician if this persists.    Instructions Specific To Procedures - Report To Physician Any Of The Following:    Upper Endoscopy   1. Pain in Chest   2. Nausea/vomitting   3. Fevers/Chills within 24 hours after procedure. Temp>101deg F   4. Severe and persistent abdominal pain and bloating     In Addition:   Mild throat soreness may follow this procedure. Warm salt water gargling or    lozenges of your choice will most likely relieve your discomfort or cold drinks and   popsicles.         If you have questions or problems contact your MD immediately. If you need immediate attention, call your MD, 911 and/or go to nearest emergency room.      Post Anesthesia Discharge Instructions    Although you may be awake and alert in the recovery room, small amounts of anesthetic remain in your system for about 24 hours.  You may feel tired and sleepy during this time.       You are advised to go directly home from the hospital.    Plan to stay at home and rest for the remainder of the day.    It is advisable to have someone with you at home for 24 hours after surgery.    Do not operate a motor vehicle, or any mechanical or electrical equipment for the next 24 hours.      Be careful when you are walking around, you may become dizzy.  The effects of anesthesia and/or medications are still present and drowsiness may occur    Do not consume alcohol, tranquilizers, sleeping medications, or any other non prescribed medication for the remainder of the day.    Diet:  begin with liquids, progress your diet as tolerated or as directed by your surgeon.  Nausea and vomiting may occur in the next 24 hours.

## 2020-05-18 NOTE — H&P (Signed)
History and Physical Exam for Gastroenterology Elective Procedure    Planned Procedure:  EGD  Procedure Date:  05/18/20  Chief Complaint/Diagnosis: Pre-bariatric surgery evaluation  Barbara Rubio is a 69 y.o. female with past medical history below presenting for EGD.      Patient History  Past Medical History:   Diagnosis Date    Gastroesophageal reflux disease 10/23/2013    Acid reflux alot after meals. TUMS, PRILOSEC     Hyperlipidemia     Hypertension     Left wrist fracture 2018    Psoriatic arthritis     LOWER BACK, LEFT HAND AND FINGERS     Urinary incontinence 12/21/2017    Over active bladder. TOVIAZ HELPS      Past Surgical History:   Procedure Laterality Date    BUNIONECTOMY Left     COLONOSCOPY      X 2     HYSTERECTOMY       partial     Social History     Tobacco Use    Smoking status: Never Smoker    Smokeless tobacco: Never Used    Tobacco comment: Never smoked or used tobacco   Vaping Use    Vaping Use: Never used   Substance Use Topics    Alcohol use: Not on file     Comment:  once a month     Drug use: Not on file     Family History   Problem Relation Age of Onset    Hypertension Mother     Hypertension Father     Hypertension Sister     Hypertension Brother     Migraines Neg Hx     Stroke Neg Hx     Seizures Neg Hx    No Known Allergies  Medications  Current Outpatient Medications   Medication Instructions    atorvastatin (LIPITOR) 10 MG tablet TAKE 1 TABLET BY MOUTH EVERY DAY AT NIGHT    estradiol (ESTRACE) 0.1 MG/GM vaginal cream TWICE A WEEK    lisinopril (ZESTRIL) 5 MG tablet TAKE 1 TABLET BY MOUTH EVERY DAY    lisinopril (ZESTRIL) 2.5 mg, Oral, Daily     Review of Systems  Pertinent items are noted in history of present illness, otherwise all systems were reviewed and are negative.      Temp:  [98 F (36.7 C)] 98 F (36.7 C)  Heart Rate:  [72] 72  Resp Rate:  [17] 17  BP: (156)/(100) 156/100    General Appearance:  Alert, well appearing, and in no distress.   Mood and affect appropriate.   Head:  Normocephalic, without obvious abnormality, atraumatic   Eyes:  Extraocular movements intact   Throat: Moist mucous membranes, pharynx normal without lesions   Neck: Supple, no significant adenopathy   Lungs:   Clear to auscultation bilaterally, respirations unlabored   Heart:  Regular rate and rhythm, S1 and S2 normal   Abdomen:   Soft, non-tender, non-distended, bowel sounds active in all four quadrants, no masses, no hepatosplenomegaly, no rebound or guarding   Rectal: Deferred   Extremities: No pedal edema noted   Skin: Normal coloration and turgor   Neurologic: Grossly normal     Anesthesia History:  No problems with anesthesia    Is patient okay for planned sedation?  Yes    Is patient experiencing pain?  No   Pain Scale:  0    Recommendations were discussed with patient who concurred with planned procedure.  Annamaria Hellingahul Roczen Waymire, MD  Gastroenterology & Hepatology

## 2020-05-18 NOTE — Transfer of Care (Signed)
Anesthesia Transfer of Care Note    Patient: Barbara Rubio    Procedures performed: Procedure(s):  EGD w/ bx's    Anesthesia type: General TIVA    Patient location:Phase II PACU    Last vitals:   Vitals:    05/18/20 1107   BP: 160/87   Pulse: 79   Resp: 15   Temp: 36.6 C (97.9 F)   SpO2: 97%       Post pain: Patient not complaining of pain, continue current therapy      Mental Status:awake    Respiratory Function: tolerating room air    Cardiovascular: stable    Nausea/Vomiting: patient not complaining of nausea or vomiting    Hydration Status: adequate    Post assessment: no apparent anesthetic complications    Signed by: Melanie Crazier  05/18/20 11:07 AM

## 2020-05-19 ENCOUNTER — Encounter (INDEPENDENT_AMBULATORY_CARE_PROVIDER_SITE_OTHER): Payer: Self-pay | Admitting: Internal Medicine

## 2020-05-20 ENCOUNTER — Ambulatory Visit: Payer: Medicare Other | Attending: Critical Care Medicine

## 2020-05-20 DIAGNOSIS — G4733 Obstructive sleep apnea (adult) (pediatric): Secondary | ICD-10-CM | POA: Insufficient documentation

## 2020-05-20 LAB — LAB USE ONLY - HISTORICAL SURGICAL PATHOLOGY

## 2020-05-21 NOTE — Miscellaneous (Signed)
Ordered as a PSG. This PT exhibited moderate-severe OSA, cyclic desats and loud snoring. There were no significant cardiac arrhythmias or PLMs noted and REM supine was recorded. OSA was much worse in REM supine.

## 2020-05-24 ENCOUNTER — Telehealth (HOSPITAL_BASED_OUTPATIENT_CLINIC_OR_DEPARTMENT_OTHER): Payer: Self-pay | Admitting: Registered"

## 2020-05-25 ENCOUNTER — Other Ambulatory Visit (INDEPENDENT_AMBULATORY_CARE_PROVIDER_SITE_OTHER): Payer: Self-pay | Admitting: Internal Medicine

## 2020-05-25 DIAGNOSIS — E782 Mixed hyperlipidemia: Secondary | ICD-10-CM

## 2020-05-26 ENCOUNTER — Telehealth (INDEPENDENT_AMBULATORY_CARE_PROVIDER_SITE_OTHER): Payer: Self-pay

## 2020-05-26 ENCOUNTER — Other Ambulatory Visit (INDEPENDENT_AMBULATORY_CARE_PROVIDER_SITE_OTHER): Payer: Self-pay | Admitting: Internal Medicine

## 2020-05-26 DIAGNOSIS — E782 Mixed hyperlipidemia: Secondary | ICD-10-CM

## 2020-05-26 NOTE — Progress Notes (Signed)
Pre OP #2    Patient was taught about avoiding sitting for long periods of time.  Making lists and planning ahead of time to be successful with their exercise program. Getting rid of the mindset of "all or nothing" when it comes to exercise.    30 minutes was spent educating patient     Goals:    1. Increase walking to 3-4 sessions/week

## 2020-05-30 ENCOUNTER — Encounter (INDEPENDENT_AMBULATORY_CARE_PROVIDER_SITE_OTHER): Payer: Self-pay

## 2020-05-31 ENCOUNTER — Ambulatory Visit (HOSPITAL_BASED_OUTPATIENT_CLINIC_OR_DEPARTMENT_OTHER): Payer: Medicare Other | Admitting: Family

## 2020-05-31 ENCOUNTER — Encounter (INDEPENDENT_AMBULATORY_CARE_PROVIDER_SITE_OTHER): Payer: Self-pay

## 2020-06-01 ENCOUNTER — Telehealth (INDEPENDENT_AMBULATORY_CARE_PROVIDER_SITE_OTHER): Payer: Self-pay | Admitting: Registered"

## 2020-06-01 DIAGNOSIS — Z719 Counseling, unspecified: Secondary | ICD-10-CM

## 2020-06-02 NOTE — Progress Notes (Signed)
This class was conducted via [employer] to limit patient exposure to COVID [employer]    S & O: Patient reports weight to be 247 lbs today.  she has gained three pounds since the previous class. Pt actively participated in group activity.  Pt continues to be interested in [facility] to improve co-morbid conditions.    Previous Wt:   Wt Readings from Last 10 Encounters:   05/18/20 240 lb   05/13/20 245 lb   05/10/20 250 lb   05/17/20 258 lb   03/18/20 249 lb   03/16/20 248 lb   02/27/20 245 lb 9.6 oz   01/27/20 244 lb 12.8 oz   12/24/19 246 lb   06/19/19 242 lb       Nutrition Assessment and Diagnosis: Pt with the condition of morbid obesity (There is no height or weight on file to calculate BMI.) and co-morbidities with ongoing nutrition knowledge deficit as evidenced by initial report;  active participation; weight gain from previous visit.  Patient continues to demonstrate motivation to lose weight and change behaviors. Patient has mild nutrition knowledge deficit as evidenced per initial report and continued elevated BMI. Patient was educated during the group session on how to effectively navigate the grocery store, reading labels ,and healthier methods of cooking.     1. Behavior Modification for weight loss  2. Identification of healthier cooking techniques.  3. Understanding how to shop for healthier items at the grocery store and what to look for on food packaging.    Materials Provided:  Copy of powerpoint slides and accompanying handouts    P.   1. Return in 1 month for additional class (Emotional Eating) with completed homework.  2.  Homework included:  Patient to include 1-2 low-fat/healthy cooking techniques to their routine and begin reading food labels regularly at the grocery store.   3.  Pt will con't goal setting per pt report/active previous class participation  and will con't to make small but measureable improvement in meal pattern/ choices/ and exercise habits.      Educated  pt for 30 minutes in a group setting.  Plan reviewed with surgeon.

## 2020-06-08 ENCOUNTER — Encounter (HOSPITAL_BASED_OUTPATIENT_CLINIC_OR_DEPARTMENT_OTHER): Payer: Self-pay | Admitting: Surgery

## 2020-06-08 ENCOUNTER — Encounter (HOSPITAL_BASED_OUTPATIENT_CLINIC_OR_DEPARTMENT_OTHER): Payer: Self-pay

## 2020-06-08 ENCOUNTER — Ambulatory Visit (INDEPENDENT_AMBULATORY_CARE_PROVIDER_SITE_OTHER): Payer: Medicare Other | Admitting: Surgery

## 2020-06-08 DIAGNOSIS — K219 Gastro-esophageal reflux disease without esophagitis: Secondary | ICD-10-CM

## 2020-06-08 DIAGNOSIS — E782 Mixed hyperlipidemia: Secondary | ICD-10-CM

## 2020-06-08 DIAGNOSIS — M25571 Pain in right ankle and joints of right foot: Secondary | ICD-10-CM

## 2020-06-08 DIAGNOSIS — M25572 Pain in left ankle and joints of left foot: Secondary | ICD-10-CM

## 2020-06-08 DIAGNOSIS — I10 Essential (primary) hypertension: Secondary | ICD-10-CM

## 2020-06-08 DIAGNOSIS — G8929 Other chronic pain: Secondary | ICD-10-CM

## 2020-06-08 DIAGNOSIS — K449 Diaphragmatic hernia without obstruction or gangrene: Secondary | ICD-10-CM | POA: Insufficient documentation

## 2020-06-08 NOTE — Progress Notes (Signed)
Assessment:  Morbid obesity  MI of 69  GERD  Hiatal hernia  Hyperlipidemia  Hypertension        Plan:   [name]  is a 69 y.o. year old morbidly obese female who has failed multiple previous attempts at conservative weight loss and has opted to proceed with robotic/laparoscopic SG-Sleeve surgery.  All questions and concerns were addressed.    The patient will need several studies--labs, EKG, and medical clearance by their PCP.  Once complete, we will review all studies with the patient prior to surgery. We appreciate your assistance with any clearances prior to surgery.  2 weeks prior to surgery, they will consume clear liquid diet (Bariatric Advantage).  My office will arrange this diet with the patient.  Patient wishes to proceed with above mentioned plan and surgery at this time.    If you have any questions or concerns, please do not hesitate and contact me.  Thank you for allowing me to participate in their care.      HPI     [name]  is a pleasant 69 y.o. year old morbidly obese female  who has completed our comprehensive pre-operative program for weight loss surgery.  They have attended and completed the checklist necessary for surgery.  Currently, they are interested in the Robotic/Laparoscopic SG-Sleeve.  I have reviewed all pertinent materials and have discussed with the patient along with any labs or diagnostics.        Bariatric comorbidities present: hypertension and GERD  The following portions of the patient's history were reviewed and updated as appropriate: allergies, current medications, past family history, past medical history, past social history, past surgical history and problem list.  CURRENT PROBLEM LIST:   Patient Active Problem List   Diagnosis    Essential hypertension    Mixed hyperlipidemia    Pain in both hands    Pain in both feet    Arthralgia, unspecified joint    Chronic pain of both ankles    Psoriatic arthritis     Severe obesity (BMI 35.0-39.9) with comorbidity    Gastroesophageal reflux disease    Hiatal hernia     PAST MEDICAL HISTORY:   Past Medical History:   Diagnosis Date    Gastroesophageal reflux disease 10/23/2013    Acid reflux alot after meals. TUMS, PRILOSEC     Hyperlipidemia     Hypertension     Left wrist fracture 2018    Psoriatic arthritis     LOWER BACK, LEFT HAND AND FINGERS     Urinary incontinence 12/21/2017    Over active bladder. TOVIAZ HELPS      PAST SURGICAL HISTORY:   Past Surgical History:   Procedure Laterality Date    BUNIONECTOMY Left     COLONOSCOPY      X 2     EGD N/A 05/18/2020    Procedure: EGD w/ bx's;  Surgeon: Sabas Sous, MD;  Location: [facility];  Service: Gastroenterology;  Laterality: N/A;    HYSTERECTOMY       partial     FAMILY HISTORY:    Family History   Problem Relation Age of Onset    Hypertension Mother     Hypertension Father     Hypertension Sister     Hypertension Brother     Migraines Neg Hx     Stroke Neg Hx     Seizures Neg Hx      SOCIAL HISTORY:  Social History     Socioeconomic History    Marital status: Single     Spouse name: None    Number of children: None    Years of education: None    Highest education level: None   Occupational History    None   Tobacco Use    Smoking status: Never Smoker    Smokeless tobacco: Never Used    Tobacco comment: Never smoked or used tobacco   Vaping Use    Vaping Use: Never used   Substance and Sexual Activity    Alcohol use: None     Comment:  once a month     Drug use: None    Sexual activity: Yes     Partners: Male     Birth control/protection: None   Other Topics Concern    Dietary supplements / vitamins Yes    Anesthesia problems No    Blood thinners No    Pregnant No    Future Children No    Number of Pregnancies? Yes     Comment: 1    Number of children Yes     Comment: 1    Miscarriages / Abortions? No    Eats large amounts No    Excessive Sweets Yes    Skips meals No     Eats excessive starches Yes    Snacks or grazes Yes    Emotional eater No    Eats fried food Yes    Eats fast food Yes    Diet Center No    Doylene Bode Yes    [facility] Weight [facility] No    Nutri-System Yes    Opti-Fast / Medi-Fast No    Overeaters Anonymous No    [facility] No    TOPS No    Weight Watchers Yes    Atkins No    Binging / Purging No    Calorie Counting Yes    Fasting No    High Protein Yes    Low Carb Yes    Low Fat Yes    [facility] Diet No    Slim Fast No    [place] Yes    Stationary cycle or treadmill No    Gym/fitness Classes No    Home exercise/video No    Swimming No    Weight training No    Walking or running Yes    Hospitalization No    Hypnosis No    Physical therapy No    Psychological therapy No    Residential program No    Acutrim No    [place] No    Contrave No    Dexatrim No    Diethylpropion No    Fastin No    Fen - Phen No    Ionamin / Adipex No    Phentermine No    Qsymia No    Prozac No    Saxenda No    Topamax No    Wellbutrin No    Xenical (Orlistat, Alli) No    Other Med No    No impairment No    Walks with cane/crutch No    Requires a wheelchair No    Bedridden No    Are you currently being treated for depression? No    Do you snore? Yes    Are you receiving any medical or psychological services? No    Do you ever wake up at night gasping for breath? Yes  Do you have or have you been treated for an eating disorder? No    Anyone ever told you that you stop breathing while asleep? No    Do you exercise regularly? No    Have you or family member ever have trouble with anesthesia? Yes   Social History Narrative    None     Social Determinants of Health     Financial Resource Strain:     Difficulty of Paying Living Expenses:    Food Insecurity:     Worried About Programme researcher, broadcasting/film/video in the Last Year:     Barista in the Last Year:    Transportation Needs:     Freight forwarder (Medical):      Lack of Transportation (Non-Medical):    Physical Activity:     Days of Exercise per Week:     Minutes of Exercise per Session:    Stress:     Feeling of Stress :    Social Connections:     Frequency of Communication with Friends and Family:     Frequency of Social Gatherings with Friends and Family:     Attends Religious Services:     Active Member of Clubs or Organizations:     Attends Engineer, structural:     Marital Status:    Intimate Partner Violence:     Fear of Current or Ex-Partner:     Emotionally Abused:     Physically Abused:     Sexually Abused:     (Does not refresh)  TOBACCO HISTORY:   Social History     Tobacco Use   Smoking Status Never Smoker   Smokeless Tobacco Never Used   Tobacco Comment    Never smoked or used tobacco     ALCOHOL HISTORY:   Social History     Substance and Sexual Activity   Alcohol Use None    Comment:  once a month      DRUG HISTORY:   Social History     Substance and Sexual Activity   Drug Use Not on file     CURRENT HOSPITAL MEDICATIONS:   Current Outpatient Medications   Medication Sig Dispense Refill    atorvastatin (LIPITOR) 10 MG tablet TAKE 1 TABLET BY MOUTH EVERY DAY AT NIGHT 90 tablet 1    estradiol (ESTRACE) 0.1 MG/GM vaginal cream TWICE A WEEK        Fesoterodine Fumarate (Toviaz) 4 MG Tablet SR 24 hr       lisinopril (ZESTRIL) 5 MG tablet TAKE 1 TABLET BY MOUTH EVERY DAY (Patient taking differently: 5 mg every evening   ) 90 tablet 1     No current facility-administered medications for this visit.     CURRENT OUTPATIENT MEDICATIONS:   Outpatient Medications Marked as Taking for the 06/08/20 encounter (Office Visit) with Bellamia Ferch R, DO   Medication Sig Dispense Refill    atorvastatin (LIPITOR) 10 MG tablet TAKE 1 TABLET BY MOUTH EVERY DAY AT NIGHT 90 tablet 1    estradiol (ESTRACE) 0.1 MG/GM vaginal cream TWICE A WEEK        Fesoterodine Fumarate (Toviaz) 4 MG Tablet SR 24 hr       lisinopril (ZESTRIL) 5 MG tablet TAKE 1  TABLET BY MOUTH EVERY DAY (Patient taking differently: 5 mg every evening   ) 90 tablet 1    [DISCONTINUED] lisinopril (ZESTRIL) 2.5 MG tablet Take 1 tablet (2.5 mg total) by mouth  daily 30 tablet 5     ALLERGIES: No Known Allergies     Review of Systems  Constitutional: negative for fevers, night sweats  Respiratory: negative for SOB, cough  Cardiovascular: negative for chest pain, palpitations  Gastrointestinal: negative for nausea or vomiting  Genitourinary:negative for hematuria, dysuria  Musculoskeletal:negative for bone pain, myalgias and stiff joints  Neurological: negative for dizziness, gait problems, headaches and memory problems  Behavioral/Psych: negative for fatigue, loss of interest in favorite activities, separation anxiety and sleep disturbance  Endocrine: negative for temperature intolerance  Integumentary: No rashes, no skin infections    Objective:    BP (!) 151/92 (BP Site: Left arm, Patient Position: Sitting, Cuff Size: Large)    Pulse 89    Temp 97.1 F (36.2 C) (Temporal)    Ht 5\' 7"     Wt 244 lb    BMI 38.22 kg/m   Body mass index is 38.22 kg/m.  Weight: 244 lb       General Appearance:    Alert, cooperative, no distress, obese   Head:    Normocephalic, without obvious abnormality, atraumatic   Eyes:    No scleral icterus                Data Review:     Office Visit on 05/13/2020   Component Date Value Ref Range Status    Glucose 05/13/2020 87  70 - 100 mg/dL Final    Comment: ADA guidelines for diabetes mellitus:  Fasting:  Equal to or greater than 126 mg/dL  Random:   Equal to or greater than 200 mg/dL      BUN 16/07/9603 54.0  7 - 19 mg/dL Final    Creatinine 98/08/9146 1.0  0.4 - 1.5 mg/dL Final    Sodium 82/95/6213 143  136 - 145 mEq/L Final    Potassium 05/13/2020 4.4  3.5 - 5.1 mEq/L Final    Chloride 05/13/2020 108  100 - 111 mEq/L Final    CO2 05/13/2020 23  21 - 29 mEq/L Final    Calcium 05/13/2020 10.0  8.5 - 10.5 mg/dL Final    Protein, Total 05/13/2020 7.4  6.0 - 8.3  g/dL Final    Albumin 08/65/7846 4.0  3.5 - 5.0 g/dL Final    AST (SGOT) 96/29/5284 19  5 - 34 U/L Final    ALT 05/13/2020 16  0 - 55 U/L Final    Alkaline Phosphatase 05/13/2020 100  37 - 106 U/L Final    Bilirubin, Total 05/13/2020 0.7  0.2 - 1.2 mg/dL Final    Globulin 13/24/4010 3.4  2.0 - 3.7 g/dL Final    Albumin/Globulin Ratio 05/13/2020 1.2  0.9 - 2.2 Final    Anion Gap 05/13/2020 12.0  5 - 15 Final    Comment: Calculated AGAP = Na - (CL + CO2)  Interpret with caution; calculated AGAP may not reflect patient's  true clinical status.      Cholesterol 05/13/2020 216* 0 - 199 mg/dL Final    Triglycerides 05/13/2020 106  34 - 149 mg/dL Final    HDL 27/25/3664 63  40 - 9,999 mg/dL Final    Comment: An HDL cholesterol <40 mg/dL is low and constitutes a  coronary heart disease risk factor, and HDL-C>59 mg/dL is  a negative risk factor for CHD.  Ref: American Heart Association; Circulation 2004      LDL Calculated 05/13/2020 403* 0 - 99 mg/dL Final    VLDL Calculated 05/13/2020 21  10 - 40  mg/dL Final    Cholesterol / HDL Ratio 05/13/2020 3.4  See Below Final    Comment: Chol/HDL Ratio:  Classification                   Female     Female  Very Low (1/2 Average Risk)      <3.4     <3.3  Low Risk                         4.0      3.8  Average Risk                     5.0      4.5  Moderate Risk (2X Average risk)  9.5      7.0  High Risk (3X Average Risk)      >23.0    >11.0      WBC 05/13/2020 4.95  3.1 - 9.5 x10 3/uL Final    Hgb 05/13/2020 13.0  11.4 - 14.8 g/dL Final    Hematocrit 16/07/9603 42.6  34.7 - 43.7 % Final    Platelets 05/13/2020 165  142 - 346 x10 3/uL Final    RBC 05/13/2020 4.78  3.90 - 5.10 x10 6/uL Final    MCV 05/13/2020 89.1  78.0 - 96.0 fL Final    MCH 05/13/2020 27.2  25.1 - 33.5 pg Final    MCHC 05/13/2020 30.5* 31 - 35 g/dL Final    RDW 54/06/8118 14  11.0 - 15.0 % Final    MPV 05/13/2020 13.6* 8.9 - 12.5 fL Final    Neutrophils 05/13/2020 46.7  None % Final     Lymphocytes Automated 05/13/2020 43.8  None % Final    Monocytes 05/13/2020 7.3  None % Final    Eosinophils Automated 05/13/2020 1.2  None % Final    Basophils Automated 05/13/2020 0.6  None % Final    Immature Granulocytes 05/13/2020 0.4  None % Final    Nucleated RBC 05/13/2020 0.0  0.0 - 0.0 /100 WBC Final    Neutrophils Absolute 05/13/2020 2.31  1 - 6 x10 3/uL Final    Lymphocytes Absolute Automated 05/13/2020 2.17  0.42 - 3.22 x10 3/uL Final    Monocytes Absolute Automated 05/13/2020 0.36  0.21 - 0.85 x10 3/uL Final    Eosinophils Absolute Automated 05/13/2020 0.06  0.00 - 0.44 x10 3/uL Final    Basophils Absolute Automated 05/13/2020 0.03  0.00 - 0.08 x10 3/uL Final    Immature Granulocytes Absolute 05/13/2020 0.02  0.00 - 0.07 x10 3/uL Final    Absolute NRBC 05/13/2020 0.00  0.00 - 0.00 x10 3/uL Final    Hemolysis Index 05/13/2020 12  0 - 18 Final    EGFR 05/13/2020 55.2   Final    Comment: Disease State Reference Ranges:    Chronic Kidney Disease; < 60 ml/min/1.73 sq.m    Kidney Failure; < 15 ml/min/1.73 sq.m    [Calculated using IDMS-Traceable MDRD equation (based on    gender, age and black vs. non-black race) recommended by    [employer] Kidney Disease Education Program. No data    available for non-white, non-black race.]    GFR estimates are unreliable in patients with:    Rapidly changing kidney function or recent dialysis,    extreme age, body size or body composition(obesity,    severe malnutrition). Abnormal muscle mass (limb    amputation, muscle wasting). In these patients,  alternative determinations of GFR should be obtained.       .  Radiology Results for the past 90 days.   @[email or web address] @    This note was generated by the [facility] EMR system/Dragon speech recognition and may contain inherent errors or omissions not intended by the user. Grammatical errors, random word insertions, deletions, pronoun errors and incomplete sentences are occasional consequences of this technology due to  software limitations. Not all errors are caught or corrected. If there are questions or concerns about the content of this note or information contained within the body of this dictation they should be addressed directly with the author for clarification.      Lachlan Pelto R. Pourhsojae, DO, FACS, FASMBS, FACOS

## 2020-06-09 ENCOUNTER — Encounter (HOSPITAL_BASED_OUTPATIENT_CLINIC_OR_DEPARTMENT_OTHER): Payer: Self-pay

## 2020-06-09 ENCOUNTER — Other Ambulatory Visit (FREE_STANDING_LABORATORY_FACILITY): Payer: Medicare Other

## 2020-06-09 DIAGNOSIS — M25571 Pain in right ankle and joints of right foot: Secondary | ICD-10-CM

## 2020-06-09 DIAGNOSIS — A048 Other specified bacterial intestinal infections: Secondary | ICD-10-CM

## 2020-06-09 DIAGNOSIS — K219 Gastro-esophageal reflux disease without esophagitis: Secondary | ICD-10-CM

## 2020-06-09 DIAGNOSIS — I10 Essential (primary) hypertension: Secondary | ICD-10-CM

## 2020-06-09 DIAGNOSIS — E782 Mixed hyperlipidemia: Secondary | ICD-10-CM

## 2020-06-09 DIAGNOSIS — M25572 Pain in left ankle and joints of left foot: Secondary | ICD-10-CM

## 2020-06-09 DIAGNOSIS — G8929 Other chronic pain: Secondary | ICD-10-CM

## 2020-06-09 LAB — HEMOLYSIS INDEX: Hemolysis Index: 1 (ref 0–18)

## 2020-06-09 LAB — CBC
Absolute NRBC: 0 10*3/uL (ref 0.00–0.00)
Hematocrit: 43.4 % (ref 34.7–43.7)
Hgb: 13.3 g/dL (ref 11.4–14.8)
MCH: 27 pg (ref 25.1–33.5)
MCHC: 30.6 g/dL — ABNORMAL LOW (ref 31.5–35.8)
MCV: 88.2 fL (ref 78.0–96.0)
MPV: 13 fL — ABNORMAL HIGH (ref 8.9–12.5)
Nucleated RBC: 0 /100 WBC (ref 0.0–0.0)
Platelets: 177 10*3/uL (ref 142–346)
RBC: 4.92 10*6/uL (ref 3.90–5.10)
RDW: 14 % (ref 11–15)
WBC: 4.79 10*3/uL (ref 3.10–9.50)

## 2020-06-09 LAB — COMPREHENSIVE METABOLIC PANEL
ALT: 35 U/L (ref 0–55)
AST (SGOT): 30 U/L (ref 5–34)
Albumin/Globulin Ratio: 1.1 (ref 0.9–2.2)
Albumin: 3.8 g/dL (ref 3.5–5.0)
Alkaline Phosphatase: 92 U/L (ref 37–106)
Anion Gap: 8 (ref 5.0–15.0)
BUN: 13 mg/dL (ref 7.0–19.0)
Bilirubin, Total: 0.7 mg/dL (ref 0.2–1.2)
CO2: 27 mEq/L (ref 21–29)
Calcium: 9.3 mg/dL (ref 8.5–10.5)
Chloride: 106 mEq/L (ref 100–111)
Creatinine: 1 mg/dL (ref 0.4–1.5)
Globulin: 3.4 g/dL (ref 2.0–3.7)
Glucose: 90 mg/dL (ref 70–100)
Potassium: 4.3 mEq/L (ref 3.5–5.1)
Protein, Total: 7.2 g/dL (ref 6.0–8.3)
Sodium: 141 mEq/L (ref 136–145)

## 2020-06-09 LAB — LIPID PANEL
Cholesterol / HDL Ratio: 3.3
Cholesterol: 208 mg/dL — ABNORMAL HIGH (ref 0–199)
HDL: 63 mg/dL (ref 40–9999)
LDL Calculated: 125 mg/dL — ABNORMAL HIGH (ref 0–99)
Triglycerides: 98 mg/dL (ref 34–149)
VLDL Calculated: 20 mg/dL (ref 10–40)

## 2020-06-09 LAB — IRON PROFILE
Iron Saturation: 39 % (ref 15–50)
Iron: 105 ug/dL (ref 40–145)
TIBC: 271 ug/dL (ref 265–497)
UIBC: 166 ug/dL (ref 126–382)

## 2020-06-09 LAB — PT/INR
PT INR: 1 (ref 0.9–1.1)
PT: 11.5 s (ref 10.1–12.9)

## 2020-06-09 LAB — HEMOGLOBIN A1C
Average Estimated Glucose: 111.2 mg/dL
Hemoglobin A1C: 5.5 % (ref 4.6–5.9)

## 2020-06-09 LAB — PTH, INTACT: PTH Intact: 107.4 pg/mL — ABNORMAL HIGH (ref 9.0–72.0)

## 2020-06-09 LAB — VITAMIN B12: Vitamin B-12: 528 pg/mL (ref 211–911)

## 2020-06-09 LAB — TSH: TSH: 2.04 u[IU]/mL (ref 0.35–4.94)

## 2020-06-09 LAB — GFR: EGFR: 55.2

## 2020-06-09 LAB — VITAMIN D,25 OH,TOTAL: Vitamin D, 25 OH, Total: 16 ng/mL — ABNORMAL LOW (ref 30–100)

## 2020-06-09 LAB — FOLATE: Folate: 9 ng/mL

## 2020-06-10 LAB — COPPER, SERUM: Copper: 1.23 (ref 0.75–1.45)

## 2020-06-10 LAB — H. PYLORI BREATH TEST: H. pylori Breath Test: NOT DETECTED

## 2020-06-10 LAB — ZINC: Zinc: 0.86 (ref 0.66–1.10)

## 2020-06-11 LAB — VITAMIN A: Vitamin A: 46.7 (ref 32.5–78.0)

## 2020-06-11 NOTE — Procedures (Signed)
Service Date: 05/20/2020     Patient Type: O     PHYSICIAN/PROVIDER: Carmelia Roller MD     REFERRING PHYSICIAN:      ORDERING PHYSICIAN:  Dr. Jeanell Sparrow.     ATTENDING PHYSICIAN:  Dr. Morrison Old.     CLINICAL DATA:  A 69 year old female with a height of 5 feet 7 inches and weight of 250  pounds with a BMI of 39 with snoring, napping during the day and diagnoses  that includes hypertension.  Epworth sleepiness scale score 4/24.     DATA:  1.  A 16-channel monitored polysomnogram performed in the usual manner.   Total time in bed 419 minutes, total sleep time 348 minutes, sleep  efficiency 83%.  2.  Sleep onset 16.7 minutes.  3.  REM onset 73 minutes.  4.  Arousals totaled 118 for an index of 20.3 arousals per hour.  5.  Leg movements were absent.  6.  Sleep architecture 65% stage 2, 5% stage 1, 11% stage 3, and 19% REM  sleep.  7.  Sleep disordered breathing for the entire study revealed 57 hypopneas.   No apneas were seen.  The overall apnea-hypopnea index (AHI) was 9.8.   There was a predisposition to sleep disordered breathing to the supine  position with a supine index of 20.5 and an AHI in nonsupine of 7.5.  There  were also 25 respiratory effort related arousals, yielding an respiratory  disturbance index (RDI) of 14.1.  These were also predisposed to the supine  position where the RDI was 40.0 versus 8.6 in the nonsupine position.  The  average duration of these events was 25 seconds with the longest hypopnea  40 seconds.  8.  Saturation maximum asleep was 99% with an average of sleep of 94% and a  nadir during REM sleep to 77%.  9.  Single-lead EKG monitoring revealed normal sinus rhythm with an average  heart rate of 68 per minute with a minimum of 59 and a maximum of 84.  10.  Snoring was mild to loud, 1 to 3+.  11.  Bruxism was absent.     CONCLUSION:  Mild positional sleep disordered breathing (diagnosis code G47.33) with an  overall apnea-hypopnea index (AHI) of 9 per hour, loud snoring and  a  desaturation nadir to 77%.       RECOMMEND:  Avoid sleeping supine.  Attain and maintain ideal body weight.  Avoid  sedatives, hypnotics, and alcohol.  Check thyroid function studies.  If  positional modification not effective, consider a trial of either CPAP or  perhaps oral dental appliance therapy.  Clinical followup recommended.           D:  06/11/2020 08:04 AM by Dr. Benay Spice. Jonluke Cobbins, MD [phone])  T:  06/11/2020 09:48 AM by NTS      Everlean Cherry: [id number]) (Doc ID: [id number])

## 2020-06-12 LAB — VITAMIN B1, PLASMA: Vitamin B1 (Thiamine): 9 nmol/L (ref 8–30)

## 2020-06-22 ENCOUNTER — Encounter (HOSPITAL_BASED_OUTPATIENT_CLINIC_OR_DEPARTMENT_OTHER): Payer: Self-pay

## 2020-06-22 ENCOUNTER — Ambulatory Visit (INDEPENDENT_AMBULATORY_CARE_PROVIDER_SITE_OTHER): Payer: Medicare Other | Admitting: Internal Medicine

## 2020-06-22 ENCOUNTER — Encounter (INDEPENDENT_AMBULATORY_CARE_PROVIDER_SITE_OTHER): Payer: Self-pay | Admitting: Internal Medicine

## 2020-06-22 ENCOUNTER — Encounter (INDEPENDENT_AMBULATORY_CARE_PROVIDER_SITE_OTHER): Payer: Self-pay

## 2020-06-22 VITALS — BP 145/77 | HR 76 | Temp 97.3°F | Wt 247.0 lb

## 2020-06-22 DIAGNOSIS — Z23 Encounter for immunization: Secondary | ICD-10-CM

## 2020-06-22 DIAGNOSIS — Z01818 Encounter for other preprocedural examination: Secondary | ICD-10-CM

## 2020-06-22 DIAGNOSIS — I10 Essential (primary) hypertension: Secondary | ICD-10-CM

## 2020-06-22 NOTE — Progress Notes (Signed)
Date: 06/22/2020 12:59 PM   Patient ID: Barbara Rubio is a 69 y.o. female.         Have you seen any specialists/other providers since your last visit with [place]?    No    Arm preference verified?   Yes    The patient is due for falls risk screening, influenza vaccine, shingles vaccine and Advance Directive    Subjective:     Chief Complaint:  Chief Complaint   Patient presents with    Pre-op Exam     Medical clearance       HPI:  Visit Type: Pre-operative Evaluation  Procedure: Weight loss , gastric sleeve   Date of Surgery: TBA  Surgeon: TBA  Fax Number (Required): [phone]  Reason for Surgery: Weight loss  Recent Health (admits): no current complaints  Recent Health (denies): fever, fatigue, chest pain, cough, nausea, vomiting, diarrhea, dyspnea, dysuria, urinary frequency, abdominal pain, easy bruising, LE swelling and poor exercise tolerance  ---------------------------------------  Exercise Tolerance: 4 met (i.e. climbing stairs )  Surgical Risk Factors: No active cardiac or  pulmonary conditions  Prior Anesthesia: Patient reports no adverse reaction to anesthesia in the past.       The patient has a history of stable chronic, essential HTN with no evidence of CHF or proteinuria.  The patient is compliant with anti-hypertensive therapy and denies side effects to therapy.  Pt denies CP, SOB, dizziness, orthopnea, PND or edema.      Appointment on 06/09/2020   Component Date Value Ref Range Status    H. pylori Breath Test 06/09/2020 NOT DETECTED  NOT DETECTED Final    Comment:   Antimicrobials, proton pump inhibitors, and bismuth  preparations are known to suppress H. pylori, and  ingestion of these prior to H. pylori diagnostic  testing may lead to false negative results.  If clinically indicated, the test may be repeated  on a new specimen obtained two weeks after  discontinuing treatment. However, a positive result is  still clinically valid.    Test Performed by Clerance Lav,  [facility],  [address] [place], [place] [address]  Si Raider, M.D., Ph.D., Director of Laboratories  [phone], CLIA [id number]         Lab Results   Component Value Date    WBC 4.79 06/09/2020    HGB 13.3 06/09/2020    HCT 43.4 06/09/2020    MCV 88.2 06/09/2020    PLT 177 06/09/2020     Lab Results   Component Value Date    NA 141 06/09/2020    K 4.3 06/09/2020    CL 106 06/09/2020    CO2 27 06/09/2020    GLU 90 06/09/2020    BUN 13.0 06/09/2020    CREAT 1.0 06/09/2020    CA 9.3 06/09/2020    PROT 7.2 06/09/2020    ALB 3.8 06/09/2020    GLOB 3.4 06/09/2020    AGRATIO 1.1 06/09/2020    BILITOTAL 0.7 06/09/2020    AST 30 06/09/2020    ALT 35 06/09/2020    ALKPHOS 92 06/09/2020    EGFR 55.2 06/09/2020     Lab Results   Component Value Date    HGBA1C 5.5 06/09/2020       Problem List:  Patient Active Problem List   Diagnosis    Essential hypertension    Mixed hyperlipidemia    Pain in both hands    Pain in both feet  Arthralgia, unspecified joint    Chronic pain of both ankles    Psoriatic arthritis    Severe obesity (BMI 35.0-39.9) with comorbidity    Gastroesophageal reflux disease    Hiatal hernia       Current Medications:  Outpatient Medications Marked as Taking for the 06/22/20 encounter (Office Visit) with Morrison Old, MD   Medication Sig Dispense Refill    atorvastatin (LIPITOR) 10 MG tablet TAKE 1 TABLET BY MOUTH EVERY DAY AT NIGHT 90 tablet 1    estradiol (ESTRACE) 0.1 MG/GM vaginal cream TWICE A WEEK        Fesoterodine Fumarate (Toviaz) 4 MG Tablet SR 24 hr       lisinopril (ZESTRIL) 5 MG tablet TAKE 1 TABLET BY MOUTH EVERY DAY (Patient taking differently: 5 mg every evening   ) 90 tablet 1       Allergies:  No Known Allergies    Past Medical History:  Past Medical History:   Diagnosis Date    Gastroesophageal reflux disease 10/23/2013    Acid reflux alot after meals. TUMS, PRILOSEC     Hyperlipidemia     Hypertension     Left wrist fracture 2018     Psoriatic arthritis     LOWER BACK, LEFT HAND AND FINGERS     Urinary incontinence 12/21/2017    Over active bladder. TOVIAZ HELPS        Past Surgical History:  Past Surgical History:   Procedure Laterality Date    BUNIONECTOMY Left     COLONOSCOPY      X 2     EGD N/A 05/18/2020    Procedure: EGD w/ bx's;  Surgeon: Sabas Sous, MD;  Location: [facility];  Service: Gastroenterology;  Laterality: N/A;    HYSTERECTOMY       partial       Family History:  Family History   Problem Relation Age of Onset    Hypertension Mother     Hypertension Father     Hypertension Sister     Hypertension Brother     Migraines Neg Hx     Stroke Neg Hx     Seizures Neg Hx        Social History:  Social History     Tobacco Use    Smoking status: Never Smoker    Smokeless tobacco: Never Used    Tobacco comment: Never smoked or used tobacco   Vaping Use    Vaping Use: Never used   Substance Use Topics    Alcohol use: Not on file     Comment:  once a month     Drug use: Not on file       The following sections were reviewed this encounter by the provider:   Tobacco   Allergies   Meds   Problems   Med Hx   Surg Hx   Fam Hx          Vitals:  BP 145/77 (BP Site: Right arm, Patient Position: Sitting, Cuff Size: X-Large)    Pulse 76    Temp 97.3 F (36.3 C) (Temporal)    Wt 112 kg (247 lb)    SpO2 98%    BMI 38.69 kg/m      General/Constitutional:   Denies Chills. Denies Fever. Denies Wt loss.  Ophthalmologic:   Denies Blurred vision.   ENT:   Denies Nasal Discharge. Denies Sinus pain. Denies Sore throat.   Respiratory:   Denies Cough. Denies  Shortness of breath. Denies Wheezing.   Cardiovascular:   Denies Chest pain. Denies Chest pain with exertion. Denies Palpitations. Denies  Swelling in hands/feet.   Gastrointestinal:   Denies Abdominal pain. Denies Constipation. Denies Diarrhea. Denies Nausea. Denies   Vomiting.   Skin:   Denies Rash.   Neurologic:   Denies Dizziness.  Denies Tingling/Numbness.    Psych:  Denies anxiety /depression/SI/HI        Objective:     Examination:   General Examination:  GENERAL APPEARANCE: alert, in no acute distress, well developed, well nourished, oriented to time, place, and person.   HEAD: normal appearance.   EYES: extraocular movement intact (EOMI), pupils equal, round, reactive to light and accommodation, sclera anicteric.   NECK/THYROID: neck supple, carotid pulse 2+ bilaterally, no lymphadenopathy, no thyromegaly.   SKIN: no rashes.   HEART: S1, S2 normal, no murmurs, rubs, gallops, regular rate and rhythm.   LUNGS: normal effort / no distress, normal breath sounds, clear to auscultation bilaterally, no wheezes, rales, rhonchi.   ABDOMEN: bowel sounds present, no hepatosplenomegaly, soft, nontender, nondistended.   EXTREMITIES: no clubbing, cyanosis, or edema.   PERIPHERAL PULSES: 2+ dorsalis pedis, 2+ posterior tibial.   NEUROLOGIC: nonfocal,  PSYCH: alert, oriented, cognitive function intact.       Assessment:     1. Preoperative examination    2. Severe obesity (BMI 35.0-39.9) with comorbidity  HIgh BMI Follow Up   BMI Follow Up Care Plan Documented   Encouragement to Exercise    3. Essential hypertension  Pt stable on current medication regimen, no concerns. Continue current medication    4. Immunization due  - Flu Vaccine High Dose, 65 yrs      Plan:   Pre-Operative Evaluation:  Surgery Specific Risk:  Intermediate (carotid, intraperitoneal, intrathoracic, prostate, major abdominal, major orthopedic, major ENT)  - There are no active cardiopulmonary symptoms.  - Exercise tolerance is greater than 4 mets.   - Perioperative Medication Recommendations:   PreopMedRecs: ASA - Pt adivsed to stop ASA at least numbers: 2 weeks days prior to surgery. NSAID's - Pt advised to stop NSAID at least numbers: 2 weeks days prior to surgery.    -I would place this patient at no higher risk for the planned procedure than others in his/her age group and I would suggest no further  testing prior to the planned procedure.     - Continue current BP medication       Morrison Old, MD

## 2020-06-23 ENCOUNTER — Other Ambulatory Visit (HOSPITAL_BASED_OUTPATIENT_CLINIC_OR_DEPARTMENT_OTHER): Payer: Self-pay

## 2020-06-23 ENCOUNTER — Telehealth (HOSPITAL_BASED_OUTPATIENT_CLINIC_OR_DEPARTMENT_OTHER): Payer: Self-pay

## 2020-06-23 ENCOUNTER — Encounter (HOSPITAL_BASED_OUTPATIENT_CLINIC_OR_DEPARTMENT_OTHER): Payer: Self-pay

## 2020-06-23 NOTE — Telephone Encounter (Signed)
Spoke to patient scheduled pre-op, post op and surgery for 10/6 Dr. Jeanell Sparrow.  Discussed remaining test  2 wk diet

## 2020-06-23 NOTE — Progress Notes (Signed)
All requested PreOp testing and paperwork faxed to requested provider

## 2020-06-24 ENCOUNTER — Encounter (INDEPENDENT_AMBULATORY_CARE_PROVIDER_SITE_OTHER): Payer: Self-pay | Admitting: Internal Medicine

## 2020-06-25 ENCOUNTER — Other Ambulatory Visit (INDEPENDENT_AMBULATORY_CARE_PROVIDER_SITE_OTHER): Payer: Self-pay | Admitting: Internal Medicine

## 2020-06-25 DIAGNOSIS — I10 Essential (primary) hypertension: Secondary | ICD-10-CM

## 2020-06-30 ENCOUNTER — Encounter (INDEPENDENT_AMBULATORY_CARE_PROVIDER_SITE_OTHER): Payer: Self-pay

## 2020-06-30 ENCOUNTER — Telehealth (INDEPENDENT_AMBULATORY_CARE_PROVIDER_SITE_OTHER): Payer: Self-pay | Admitting: Registered"

## 2020-06-30 DIAGNOSIS — Z719 Counseling, unspecified: Secondary | ICD-10-CM

## 2020-06-30 NOTE — Progress Notes (Signed)
This class was conducted via Zoom Webinar to limit patient exposure to COVID 19    S:  Pt presents with questions about post op diet phases, supplements and lifestyle after weight loss surgery.  Pt presents for 2 hour pre operative education class.    A:  Pt has nutrition knowledge deficit regarding pre op and post op nutrition guidelines for weight loss surgery.  Pt has been educated on the following topics:  Anatomy review.  Necessary behavior/eating modifications to avoid complications.  Post op liquid diet phase  Post op mushy/soft foods diet phase  Vitamin/mineral supplementation and consequences of non compliance.  Reviewed symptoms of deficiencies.  Protein supplementation - requirements of protein supplements, amounts needed per surgery/pt, and consequences of non compliance.  Review of the nutrition fact panel, what to look for.  Review of dumping syndrome and it's effects in addition to trigger foods.  Review of possible post operative complications, tips/techniques to manage them.  Review of appropriate foods and meal plans for each diet phase.  Quick review of physical activity and the guidelines pt needs to follow post operatively.    P:  1.  Pt to follow up with MD and RD for final pre op visit.    2.  Contact information provided.  Pt to contact PRN.    Spent a total of 118 minutes educating pt in a group setting.  Plan reviewed with surgeon.

## 2020-07-01 ENCOUNTER — Encounter (INDEPENDENT_AMBULATORY_CARE_PROVIDER_SITE_OTHER): Payer: Self-pay

## 2020-07-02 ENCOUNTER — Encounter (HOSPITAL_BASED_OUTPATIENT_CLINIC_OR_DEPARTMENT_OTHER): Payer: Self-pay | Admitting: Registered"

## 2020-07-12 ENCOUNTER — Telehealth: Payer: Self-pay | Admitting: Family Medicine

## 2020-07-12 NOTE — Telephone Encounter (Signed)
Patient is requesting the last location of breast center.

## 2020-07-15 ENCOUNTER — Ambulatory Visit: Payer: Medicare Other | Attending: Surgery

## 2020-07-15 ENCOUNTER — Encounter: Payer: Self-pay | Admitting: Surgery

## 2020-07-15 NOTE — Pre-Procedure Instructions (Signed)
·   Surgical Risk Level : (Low, Intermediate, High)  ? Bariatric-intermediate      Surgeon Testing Requirements:  Clearance-Medical [x] Cardiac[] Other[]   Labs,EKG,EGD,Pysch consult,  Sleep study[x] CXR [x] Sono[x] UGI[]       Anesthesia Guideline Requirements:  N/A   Specialist Notes / Test Results / Records Requested:          preops- requested to surgeon office      Recent Hospitalization / ED Visit: N/A      Future Plan / Upcoming Appts:   ?           Labs/Testing @ [facility]: N/A      Email Sent To Patient:   Emailed Pt Bariatric brochure/Education video/Hibiclens info -[email or web address]               Epic Orders Entered: Surgeon to enter in [employer]      Other Outlying information gathered that does not fit anywhere else           N/A      Chart Room Handoff for Further  Follow-up if Applicable:                NPO instruction -SEE BELOW  ?  Clear liquid until  MN. ONLY water,Gatorade until 4 hours prior to surgery time.No Gum,candy dos.Pt verbalize understaning of this instruction[]     ?   Nothing by mouth after MN include no gum and candy after MN.Pt readback understaning of this instruction[x]       Visitor Restriction Guidelines:   One family member may accompany the patient on day of surgery-All persons will be screened at hospital main entrance upon arrival.  Family members and patients must wear masks and use social distancing while in waiting areas and cafeteria area.   Once patient goes to OR, family members are encouraged to go home for inpatients or stay in car for outpatients. Physicians will call contact person to give report of procedure.     Inpatient visitation will be honored during inpatient visiting hours- 2 visitors/ Age 59 + can visit daily from 0900-1830. No family members will be allowed into Phase 1 recovery areas.

## 2020-07-16 ENCOUNTER — Encounter (HOSPITAL_BASED_OUTPATIENT_CLINIC_OR_DEPARTMENT_OTHER): Payer: Self-pay | Admitting: Registered"

## 2020-07-20 ENCOUNTER — Encounter (HOSPITAL_BASED_OUTPATIENT_CLINIC_OR_DEPARTMENT_OTHER): Payer: Self-pay

## 2020-07-20 ENCOUNTER — Encounter (HOSPITAL_BASED_OUTPATIENT_CLINIC_OR_DEPARTMENT_OTHER): Payer: Self-pay | Admitting: Surgery

## 2020-07-20 ENCOUNTER — Ambulatory Visit (INDEPENDENT_AMBULATORY_CARE_PROVIDER_SITE_OTHER): Payer: Medicare Other | Admitting: Surgery

## 2020-07-20 ENCOUNTER — Ambulatory Visit (INDEPENDENT_AMBULATORY_CARE_PROVIDER_SITE_OTHER): Payer: Self-pay | Admitting: Registered"

## 2020-07-20 DIAGNOSIS — Z713 Dietary counseling and surveillance: Secondary | ICD-10-CM

## 2020-07-20 DIAGNOSIS — E782 Mixed hyperlipidemia: Secondary | ICD-10-CM

## 2020-07-20 DIAGNOSIS — K449 Diaphragmatic hernia without obstruction or gangrene: Secondary | ICD-10-CM

## 2020-07-20 DIAGNOSIS — I10 Essential (primary) hypertension: Secondary | ICD-10-CM

## 2020-07-20 DIAGNOSIS — K219 Gastro-esophageal reflux disease without esophagitis: Secondary | ICD-10-CM

## 2020-07-20 DIAGNOSIS — L4050 Arthropathic psoriasis, unspecified: Secondary | ICD-10-CM

## 2020-07-20 DIAGNOSIS — Z719 Counseling, unspecified: Secondary | ICD-10-CM

## 2020-07-20 DIAGNOSIS — E559 Vitamin D deficiency, unspecified: Secondary | ICD-10-CM

## 2020-07-20 NOTE — Progress Notes (Signed)
S:  Pt is preparing for bariatric sleeve surgery.  Pt presents for pre-operative review appointment and has questions about post-operative diet stages and vitamin/mineral requirements.  Pt states no hx of nutritional deficiencies.  Pt reports currently taking no supplements at this time.  Pt has/has not started liquid diet at this time.  Pt has been on the pre-op diet for 6 days.  They report no issues w with N/V/C/D at this time.  Current weight loss:  11 pounds.    O:    Today's Wt:    236 lbs    Previous Wt:    Wt Readings from Last 10 Encounters:   06/22/20 247 lb   06/08/20 244 lb   05/18/20 240 lb   05/13/20 245 lb   05/10/20 250 lb   05/17/20 258 lb   03/18/20 249 lb   03/16/20 248 lb   02/27/20 245 lb 9.6 oz   01/27/20 244 lb 12.8 oz     Pertinent lab values:    A:  Per recent lab work, pt has no apparent vitamin or mineral deficiencies.  Answered pt questions and pt verbalized understanding and desired compliance with post-operative diet.    P:  1.  Pt to start on post operative 3 week liquid diet upon discharge from hospital and continue until directed by RD/physician at first post-operative in office appointment.  2.  Pt to start the following chewable or liquid vitamins/minerals upon hospital discharge:   A.  Complete Adult Multivitamin and Mineral:  1 tablet BID   B.  500 mg Calcium Citrate, 1 tablet BID   C.  1,000 mcg Vitamin B12 sublingually, q day.   D.  Vitamin B 50 Complex, 1 tablet/serving q day.   E.  Vitamin D3, 5000 IU q day.  Pt to start pre op.  3.  Pt advised to call with any questions - contact information given.  F/u in 10-14 post operatively or PRN.    Spent a total of 15 minutes educating pt in a individual one-on-one setting.  Plan reviewed with surgeon.

## 2020-07-20 NOTE — H&P (View-Only) (Signed)
Assessment:  Morbid obesity  BMI of 37  GERD without esophagitis  Hypertension  Hyperlipidemia  Hiatal hernia  Vitamin D deficiency  Psoriatic arthritis        Plan:  This is a 69 y.o. year old morbidly obese female who has failed multiple previous attempts at conservative weight loss and has opted to proceed with robotic/laparoscopic SG-Sleeve surgery.  All questions and concerns were addressed.  The risks, benefits and alternate options were discussed with in detail.  The risks include but are not limited to bleeding, infection, sepsis, shock, wound infection, wound herniation, DVT, PE, death, as well as unforeseen conditions such as  inadequate weight loss, excessive weight loss, weight regain, staple line leaks, gastric ulcers, and gastric strictures.  Patient wishes to proceed with above mentioned plan and surgery at this time.        History of Present Illness:  The patient is a pleasant 68 y.o. year old morbidly obese female  who has failed multiple previous attempts at conservative weight loss. She has opted to proceed with robotic/laparoscopic SG-Sleeve as a surgical weight loss option.  All other options were discussed in detail. The patient has undergone an extensive preoperative education and medical clearance along with dietary and psychiatric counseling.      Bariatric comorbidities present: hypertension and GERD  The following portions of the patient's history were reviewed and updated as appropriate: allergies, current medications, past family history, past medical history, past social history, past surgical history and problem list.  CURRENT PROBLEM LIST:   Patient Active Problem List   Diagnosis   • Essential hypertension   • Mixed hyperlipidemia   • Pain in both hands   • Pain in both feet   • Arthralgia, unspecified joint   • Chronic pain of both ankles   • Psoriatic arthritis   • Severe obesity (BMI 35.0-39.9) with comorbidity   • Gastroesophageal reflux disease   • Hiatal hernia   • Vitamin D  deficiency     PAST MEDICAL HISTORY:   Past Medical History:   Diagnosis Date   • Gastroesophageal reflux disease 10/23/2013    Acid reflux alot after meals. TUMS, PRILOSEC    • Hyperlipidemia    • Hypertension     well controlled   • Left wrist fracture 2018   • Psoriatic arthritis     LOWER BACK, LEFT HAND AND FINGERS    • Urinary incontinence 12/21/2017    Over active bladder. TOVIAZ HELPS      PAST SURGICAL HISTORY:   Past Surgical History:   Procedure Laterality Date   • BUNIONECTOMY Left    • COLONOSCOPY      X 2    • EGD N/A 05/18/2020    Procedure: EGD w/ bx's;  Surgeon: Kataria, Rahul D, MD;  Location: [facility];  Service: Gastroenterology;  Laterality: N/A;   • HYSTERECTOMY       partial     FAMILY HISTORY:    Family History   Problem Relation Age of Onset   • Hypertension Mother    • Hypertension Father    • Hypertension Sister    • Hypertension Brother    • Migraines Neg Hx    • Stroke Neg Hx    • Seizures Neg Hx      SOCIAL HISTORY:   Social History     Socioeconomic History   • Marital status: Single     Spouse name: None   • Number   of children: None   • Years of education: None   • Highest education level: None   Occupational History   • None   Tobacco Use   • Smoking status: Never Smoker   • Smokeless tobacco: Never Used   • Tobacco comment: Never smoked or used tobacco   Vaping Use   • Vaping Use: Never used   Substance and Sexual Activity   • Alcohol use: Never     Comment:  once a month    • Drug use: Never   • Sexual activity: Yes     Partners: Male     Birth control/protection: None   Other Topics Concern   • Dietary supplements / vitamins Yes   • Anesthesia problems No   • Blood thinners No   • Pregnant No   • Future Children No   • Number of Pregnancies? Yes     Comment: 1   • Number of children Yes     Comment: 1   • Miscarriages / Abortions? No   • Eats large amounts No   • Excessive Sweets Yes   • Skips meals No   • Eats excessive starches Yes   • Snacks or grazes Yes   •  Emotional eater No   • Eats fried food Yes   • Eats fast food Yes   • Diet Center No   • Jenny Craig Yes   • [facility] Weight [facility] No   • Nutri-System Yes   • Opti-Fast / Medi-Fast No   • Overeaters Anonymous No   • [facility] No   • TOPS No   • Weight Watchers Yes   • Atkins No   • Binging / Purging No   • Calorie Counting Yes   • Fasting No   • High Protein Yes   • Low Carb Yes   • Low Fat Yes   • [facility] Diet No   • Slim Fast No   • [place] Yes   • Stationary cycle or treadmill No   • Gym/fitness Classes No   • Home exercise/video No   • Swimming No   • Weight training No   • Walking or running Yes   • Hospitalization No   • Hypnosis No   • Physical therapy No   • Psychological therapy No   • Residential program No   • Acutrim No   • [place] No   • Contrave No   • Dexatrim No   • Diethylpropion No   • Fastin No   • Fen - Phen No   • Ionamin / Adipex No   • Phentermine No   • Qsymia No   • Prozac No   • Saxenda No   • Topamax No   • Wellbutrin No   • Xenical (Orlistat, Alli) No   • Other Med No   • No impairment No   • Walks with cane/crutch No   • Requires a wheelchair No   • Bedridden No   • Are you currently being treated for depression? No   • Do you snore? Yes   • Are you receiving any medical or psychological services? No   • Do you ever wake up at night gasping for breath? Yes   • Do you have or have you been treated for an eating disorder? No   • Anyone ever told you that you stop breathing while   asleep? No   • Do you exercise regularly? No   • Have you or family member ever have trouble with anesthesia? Yes   Social History Narrative   • None     Social Determinants of Health     Financial Resource Strain:    • Difficulty of Paying Living Expenses:    Food Insecurity:    • Worried About Running Out of Food in the Last Year:    • Ran Out of Food in the Last Year:    Transportation Needs:    • Lack of Transportation (Medical):    • Lack of Transportation (Non-Medical):    Physical  Activity:    • Days of Exercise per Week:    • Minutes of Exercise per Session:    Stress:    • Feeling of Stress :    Social Connections:    • Frequency of Communication with Friends and Family:    • Frequency of Social Gatherings with Friends and Family:    • Attends Religious Services:    • Active Member of Clubs or Organizations:    • Attends Club or Organization Meetings:    • Marital Status:    Intimate Partner Violence:    • Fear of Current or Ex-Partner:    • Emotionally Abused:    • Physically Abused:    • Sexually Abused:     (Does not refresh)  TOBACCO HISTORY:   Social History     Tobacco Use   Smoking Status Never Smoker   Smokeless Tobacco Never Used   Tobacco Comment    Never smoked or used tobacco     ALCOHOL HISTORY:   Social History     Substance and Sexual Activity   Alcohol Use Never    Comment:  once a month      DRUG HISTORY:   Social History     Substance and Sexual Activity   Drug Use Never     CURRENT HOSPITAL MEDICATIONS:   Current Outpatient Medications   Medication Sig Dispense Refill   • atorvastatin (LIPITOR) 10 MG tablet TAKE 1 TABLET BY MOUTH EVERY DAY AT NIGHT 90 tablet 1   • estradiol (ESTRACE) 0.1 MG/GM vaginal cream TWICE A WEEK       • Fesoterodine Fumarate (Toviaz) 4 MG Tablet SR 24 hr 2 (two) times daily        • lisinopril (ZESTRIL) 5 MG tablet Take 1 tablet (5 mg total) by mouth daily 90 tablet 3     No current facility-administered medications for this visit.     CURRENT OUTPATIENT MEDICATIONS:   Outpatient Medications Marked as Taking for the 07/20/20 encounter (Clinical Support) with Zaden Sako R, DO   Medication Sig Dispense Refill   • atorvastatin (LIPITOR) 10 MG tablet TAKE 1 TABLET BY MOUTH EVERY DAY AT NIGHT 90 tablet 1   • estradiol (ESTRACE) 0.1 MG/GM vaginal cream TWICE A WEEK       • Fesoterodine Fumarate (Toviaz) 4 MG Tablet SR 24 hr 2 (two) times daily        • lisinopril (ZESTRIL) 5 MG tablet Take 1 tablet (5 mg total) by mouth daily 90 tablet 3      ALLERGIES: No Known Allergies     Review of Systems  Constitutional: negative for fevers, night sweats  Respiratory: negative for SOB, cough  Cardiovascular: negative for chest pain, palpitations  Gastrointestinal: negative for nausea vomiting  Genitourinary:negative for hematuria, dysuria  Musculoskeletal:negative   for bone pain, myalgias and stiff joints  Neurological: negative for dizziness, gait problems, headaches and memory problems  Behavioral/Psych: negative for fatigue, loss of interest in favorite activities, separation anxiety and sleep disturbance  Endocrine: negative for temperature intolerance  Integumentary: No rashes, no skin infections    Objective:    BP 119/82 (BP Site: Left arm, Patient Position: Sitting, Cuff Size: Large)    Pulse 89    Temp (!) 96.6 °F (35.9 °C) (Temporal)    Ht 5' 7"    Wt 237 lb    BMI 37.12 kg/m²   Body mass index is 37.12 kg/m².  Weight: 237 lb     A female chaperone was present during the entire physical exam, Kashawna Jones   General Appearance:    Alert, cooperative, no distress, obese   Head:    Normocephalic, without obvious abnormality, atraumatic   Eyes:    No scleral icterus            Throat:   Lips, mucosa, and tongue normal; teeth and gums normal   Neck:   Supple, symmetrical, trachea midline, no adenopathy;        thyroid:  No enlargement/tenderness/nodules; no carotid    bruit or JVD   Back:     Symmetric, no curvature, ROM normal, no CVA tenderness   Lungs:     Clear to auscultation bilaterally, respirations unlabored   Chest wall:    No tenderness or deformity   Heart:    Regular rate and rhythm, S1 and S2 normal, no murmur, rub   or gallop   Abdomen:     Soft, non-tender, bowel sounds active all four quadrants,     no masses, no organomegaly   Extremities:   Extremities normal, atraumatic, no cyanosis or edema   Pulses:   2+ and symmetric all extremities   Skin:   Skin color, texture, turgor normal, no rashes or lesions   Lymph nodes:   Cervical,  supraclavicular, and axillary nodes normal   Neurologic:   Alert and oriented x 3.  Able to move all extremities.       Data Review:     Appointment on 06/09/2020   Component Date Value Ref Range Status   • H. pylori Breath Test 06/09/2020 NOT DETECTED  NOT DETECTED Final    Comment:   Antimicrobials, proton pump inhibitors, and bismuth  preparations are known to suppress H. pylori, and  ingestion of these prior to H. pylori diagnostic  testing may lead to false negative results.  If clinically indicated, the test may be repeated  on a new specimen obtained two weeks after  discontinuing treatment. However, a positive result is  still clinically valid.    Test Performed by [facility] [place],  [facility],  [address], [place], [place] [address]  Patrick W Mason, M.D., Ph.D., Director of Laboratories  [phone], CLIA [id number]     Appointment on 06/09/2020   Component Date Value Ref Range Status   • Vitamin A 06/09/2020 46.7  32.5 - 78.0 Final    Comment:   -------------------ADDITIONAL INFORMATION-------------------  This test was developed and its performance characteristics  determined by [facility] in a manner consistent with CLIA  requirements. This test has not been cleared or approved by  the U.S. Food and Drug Administration.    Test Performed by:  [facility] [place] [address]  [address], [place] [address]  Lab Director: William G. Morice   M.D. Ph.D.; CLIA# [id number]     • Vitamin B1 (Thiamine) 06/09/2020 9  8 - 30 nmol/L Final    Comment:   Vitamin supplementation within 24 hours prior to  blood draw may affect the accuracy of the results.    This test was developed and its analytical performance  characteristics have been determined by [facility] [place], [place]. It has  not been cleared or approved by the U.S. Food and Drug  Administration. This assay has been validated pursuant  to the CLIA regulations and is  used for clinical  purposes.    Test Performed by [facility] [place],  [facility],  [address], [place], [place] [address]  Patrick W Mason, M.D., Ph.D., Director of Laboratories  [phone], CLIA [id number]     • Cholesterol 06/09/2020 208* 0 - 199 mg/dL Final   • Triglycerides 06/09/2020 98  34 - 149 mg/dL Final   • HDL 06/09/2020 63  40 - 9,999 mg/dL Final    Comment: An HDL cholesterol <40 mg/dL is low and constitutes a  coronary heart disease risk factor, and HDL-C>59 mg/dL is  a negative risk factor for CHD.  Ref: American Heart Association; Circulation 2004     • LDL Calculated 06/09/2020 125* 0 - 99 mg/dL Final   • VLDL Calculated 06/09/2020 20  10 - 40 mg/dL Final   • Cholesterol / HDL Ratio 06/09/2020 3.3  See Below Final    Comment: Chol/HDL Ratio:  Classification                   Female     Female  Very Low (1/2 Average Risk)      <3.4     <3.3  Low Risk                         4.0      3.8  Average Risk                     5.0      4.5  Moderate Risk (2X Average risk)  9.5      7.0  High Risk (3X Average Risk)      >23.0    >11.0     • Folate 06/09/2020 9.0  See below ng/mL Final    Comment: Deficient    : <3.5 ng/mL  Intermediate : 3.5 - 5.4 ng/mL  Normal       : Greater Than 5.4 ng/mL     • Vitamin B-12 06/09/2020 528  211 - 911 pg/mL Final   • WBC 06/09/2020 4.79  3.1 - 9.5 x10 3/uL Final   • Hgb 06/09/2020 13.3  11.4 - 14.8 g/dL Final   • Hematocrit 06/09/2020 43.4  34.7 - 43.7 % Final   • Platelets 06/09/2020 177  142 - 346 x10 3/uL Final   • RBC 06/09/2020 4.92  3.90 - 5.10 x10 6/uL Final   • MCV 06/09/2020 88.2  78.0 - 96.0 fL Final   • MCH 06/09/2020 27.0  25.1 - 33.5 pg Final   • MCHC 06/09/2020 30.6* 31 - 35 g/dL Final   • RDW 06/09/2020 14  11.0 - 15.0 % Final   • MPV 06/09/2020 13.0* 8.9 - 12.5 fL Final   • Nucleated RBC 06/09/2020 0.0  0.0 - 0.0 /100 WBC Final   • Absolute NRBC 06/09/2020 0.00  0.00 - 0.00 x10   3/uL Final   • Glucose 06/09/2020 90  70 - 100 mg/dL  Final    Comment: ADA guidelines for diabetes mellitus:  Fasting:  Equal to or greater than 126 mg/dL  Random:   Equal to or greater than 200 mg/dL     • BUN 06/09/2020 13.0  7 - 19 mg/dL Final   • Creatinine 06/09/2020 1.0  0.4 - 1.5 mg/dL Final   • Sodium 06/09/2020 141  136 - 145 mEq/L Final   • Potassium 06/09/2020 4.3  3.5 - 5.1 mEq/L Final   • Chloride 06/09/2020 106  100 - 111 mEq/L Final   • CO2 06/09/2020 27  21 - 29 mEq/L Final   • Calcium 06/09/2020 9.3  8.5 - 10.5 mg/dL Final   • Protein, Total 06/09/2020 7.2  6.0 - 8.3 g/dL Final   • Albumin 06/09/2020 3.8  3.5 - 5.0 g/dL Final   • AST (SGOT) 06/09/2020 30  5 - 34 U/L Final   • ALT 06/09/2020 35  0 - 55 U/L Final   • Alkaline Phosphatase 06/09/2020 92  37 - 106 U/L Final   • Bilirubin, Total 06/09/2020 0.7  0.2 - 1.2 mg/dL Final   • Globulin 06/09/2020 3.4  2.0 - 3.7 g/dL Final   • Albumin/Globulin Ratio 06/09/2020 1.1  0.9 - 2.2 Final   • Anion Gap 06/09/2020 8.0  5 - 15 Final    Comment: Calculated AGAP = Na - (CL + CO2)  Interpret with caution; calculated AGAP may not reflect patient's  true clinical status.     • Iron 06/09/2020 105  40 - 145 ug/dL Final   • UIBC 06/09/2020 166  126 - 382 ug/dL Final   • TIBC 06/09/2020 271  265 - 497 ug/dL Final   • Iron Saturation 06/09/2020 39  15 - 50 % Final   • PTH Intact 06/09/2020 107.4* 9.0 - 72.0 pg/mL Final   • Copper 06/09/2020 1.23  0.75 - 1.45 Final    Comment:   -------------------ADDITIONAL INFORMATION-------------------  This test was developed and its performance characteristics  determined by [facility] in a manner consistent with CLIA  requirements. This test has not been cleared or approved by  the U.S. Food and Drug Administration.    Test Performed by:  [facility] [place] [address]  [address], [place] [address]  Lab Director: William G. Morice M.D. Ph.D.; CLIA# [id number]     • Zinc 06/09/2020 0.86  0.66 - 1.10 Final    Comment:    -------------------ADDITIONAL INFORMATION-------------------  This test was developed and its performance characteristics  determined by [facility] in a manner consistent with CLIA  requirements. This test has not been cleared or approved by  the U.S. Food and Drug Administration.    Test Performed by:  [facility] [place] [address]  [address], [place] [address]  Lab Director: William G. Morice M.D. Ph.D.; CLIA# [id number]     • TSH 06/09/2020 2.04  0.35 - 4.94 uIU/mL Final   • Vitamin D, 25 OH, Total 06/09/2020 16* 30 - 100 ng/mL Final    Comment: Vitamin D, 25-OH, Total: Testing performed using  ABBOTT Architect chemiluminescent microparticle  immunoassay methodology. Method-dependent minor difference  may exist based on the test platform used.  Vitamin D levels of <20 ng/ml are considered deficiency.  Vitamin D levels of 20-30 ng/ml suggest insufficiency.  Optimal Vitamin D levels are > or =  30 ng/ml.     • PT 06/09/2020 11.5  10.1 - 12.9 sec Final   • PT INR 06/09/2020 1.0  0.9 - 1.1 Final    Comment: Recommended Ranges for Protime INR:  2.0-3.0 for most medical and surgical thromboembolic states  2.5-3.5 for artificial heart valves INR result may not represent  exact Warfarin dosing level during the transition period from  Heparin to Warfarin therapy. Results should be interpreted based  on current anticoagulant therapy and patient's clinical presentation.     • Hemoglobin A1C 06/09/2020 5.5  4.6 - 5.9 % Final    Comment: Test performed using Abbott Architect enzymatic method.  HBA1C: Hemoglobin A1c values of 5.7-6.4% indicate an increased  risk for developing diabetes mellitus. Hemoglobin A1c values  greater than or equal to 6.5% are diagnostic of diabetes  mellitus. A Triglyceride result greater than 3000 mg/dL can  falsely decrease HBA1C results.     • Average Estimated Glucose 06/09/2020 111.2  mg/dL Final   • Hemolysis Index 06/09/2020 1  0 - 18 Final   • EGFR  06/09/2020 55.2   Final    Comment: Disease State Reference Ranges:    Chronic Kidney Disease; < 60 ml/min/1.73 sq.m    Kidney Failure; < 15 ml/min/1.73 sq.m    [Calculated using IDMS-Traceable MDRD equation (based on    gender, age and black vs. non-black race) recommended by    National Kidney Disease Education Program. No data    available for non-white, non-black race.]    GFR estimates are unreliable in patients with:    Rapidly changing kidney function or recent dialysis,    extreme age, body size or body composition(obesity,    severe malnutrition). Abnormal muscle mass (limb    amputation, muscle wasting). In these patients,    alternative determinations of GFR should be obtained.     Office Visit on 05/13/2020   Component Date Value Ref Range Status   • Glucose 05/13/2020 87  70 - 100 mg/dL Final    Comment: ADA guidelines for diabetes mellitus:  Fasting:  Equal to or greater than 126 mg/dL  Random:   Equal to or greater than 200 mg/dL     • BUN 05/13/2020 13.0  7 - 19 mg/dL Final   • Creatinine 05/13/2020 1.0  0.4 - 1.5 mg/dL Final   • Sodium 05/13/2020 143  136 - 145 mEq/L Final   • Potassium 05/13/2020 4.4  3.5 - 5.1 mEq/L Final   • Chloride 05/13/2020 108  100 - 111 mEq/L Final   • CO2 05/13/2020 23  21 - 29 mEq/L Final   • Calcium 05/13/2020 10.0  8.5 - 10.5 mg/dL Final   • Protein, Total 05/13/2020 7.4  6.0 - 8.3 g/dL Final   • Albumin 05/13/2020 4.0  3.5 - 5.0 g/dL Final   • AST (SGOT) 05/13/2020 19  5 - 34 U/L Final   • ALT 05/13/2020 16  0 - 55 U/L Final   • Alkaline Phosphatase 05/13/2020 100  37 - 106 U/L Final   • Bilirubin, Total 05/13/2020 0.7  0.2 - 1.2 mg/dL Final   • Globulin 05/13/2020 3.4  2.0 - 3.7 g/dL Final   • Albumin/Globulin Ratio 05/13/2020 1.2  0.9 - 2.2 Final   • Anion Gap 05/13/2020 12.0  5 - 15 Final    Comment: Calculated AGAP = Na - (CL + CO2)  Interpret with caution; calculated AGAP may not reflect patient's  true clinical status.     •   Cholesterol 05/13/2020 216* 0 - 199  mg/dL Final   • Triglycerides 05/13/2020 106  34 - 149 mg/dL Final   • HDL 05/13/2020 63  40 - 9,999 mg/dL Final    Comment: An HDL cholesterol <40 mg/dL is low and constitutes a  coronary heart disease risk factor, and HDL-C>59 mg/dL is  a negative risk factor for CHD.  Ref: American Heart Association; Circulation 2004     • LDL Calculated 05/13/2020 132* 0 - 99 mg/dL Final   • VLDL Calculated 05/13/2020 21  10 - 40 mg/dL Final   • Cholesterol / HDL Ratio 05/13/2020 3.4  See Below Final    Comment: Chol/HDL Ratio:  Classification                   Female     Female  Very Low (1/2 Average Risk)      <3.4     <3.3  Low Risk                         4.0      3.8  Average Risk                     5.0      4.5  Moderate Risk (2X Average risk)  9.5      7.0  High Risk (3X Average Risk)      >23.0    >11.0     • WBC 05/13/2020 4.95  3.1 - 9.5 x10 3/uL Final   • Hgb 05/13/2020 13.0  11.4 - 14.8 g/dL Final   • Hematocrit 05/13/2020 42.6  34.7 - 43.7 % Final   • Platelets 05/13/2020 165  142 - 346 x10 3/uL Final   • RBC 05/13/2020 4.78  3.90 - 5.10 x10 6/uL Final   • MCV 05/13/2020 89.1  78.0 - 96.0 fL Final   • MCH 05/13/2020 27.2  25.1 - 33.5 pg Final   • MCHC 05/13/2020 30.5* 31 - 35 g/dL Final   • RDW 05/13/2020 14  11.0 - 15.0 % Final   • MPV 05/13/2020 13.6* 8.9 - 12.5 fL Final   • Neutrophils 05/13/2020 46.7  None % Final   • Lymphocytes Automated 05/13/2020 43.8  None % Final   • Monocytes 05/13/2020 7.3  None % Final   • Eosinophils Automated 05/13/2020 1.2  None % Final   • Basophils Automated 05/13/2020 0.6  None % Final   • Immature Granulocytes 05/13/2020 0.4  None % Final   • Nucleated RBC 05/13/2020 0.0  0.0 - 0.0 /100 WBC Final   • Neutrophils Absolute 05/13/2020 2.31  1 - 6 x10 3/uL Final   • Lymphocytes Absolute Automated 05/13/2020 2.17  0.42 - 3.22 x10 3/uL Final   • Monocytes Absolute Automated 05/13/2020 0.36  0.21 - 0.85 x10 3/uL Final   • Eosinophils Absolute Automated 05/13/2020 0.06  0.00 - 0.44 x10  3/uL Final   • Basophils Absolute Automated 05/13/2020 0.03  0.00 - 0.08 x10 3/uL Final   • Immature Granulocytes Absolute 05/13/2020 0.02  0.00 - 0.07 x10 3/uL Final   • Absolute NRBC 05/13/2020 0.00  0.00 - 0.00 x10 3/uL Final   • Hemolysis Index 05/13/2020 12  0 - 18 Final   • EGFR 05/13/2020 55.2   Final    Comment: Disease State Reference Ranges:    Chronic Kidney Disease; < 60 ml/min/1.73 sq.m      Kidney Failure; < 15 ml/min/1.73 sq.m    [Calculated using IDMS-Traceable MDRD equation (based on    gender, age and black vs. non-black race) recommended by    National Kidney Disease Education Program. No data    available for non-white, non-black race.]    GFR estimates are unreliable in patients with:    Rapidly changing kidney function or recent dialysis,    extreme age, body size or body composition(obesity,    severe malnutrition). Abnormal muscle mass (limb    amputation, muscle wasting). In these patients,    alternative determinations of GFR should be obtained.       .  Radiology Results for the past 90 days.   [email or web address]      Lewanda Perea R. Noella Kipnis, DO, FACS, FASMBS, FACOS

## 2020-07-20 NOTE — Progress Notes (Signed)
Assessment:  Morbid obesity  BMI of 37  GERD without esophagitis  Hypertension  Hyperlipidemia  Hiatal hernia  Vitamin D deficiency  Psoriatic arthritis        Plan:  This is a 69 y.o. year old morbidly obese female who has failed multiple previous attempts at conservative weight loss and has opted to proceed with robotic/laparoscopic SG-Sleeve surgery.  All questions and concerns were addressed.  The risks, benefits and alternate options were discussed with in detail.  The risks include but are not limited to bleeding, infection, sepsis, shock, wound infection, wound herniation, DVT, PE, death, as well as unforeseen conditions such as  inadequate weight loss, excessive weight loss, weight regain, staple line leaks, gastric ulcers, and gastric strictures.  Patient wishes to proceed with above mentioned plan and surgery at this time.        History of Present Illness:  The patient is a pleasant 69 y.o. year old morbidly obese female  who has failed multiple previous attempts at conservative weight loss. She has opted to proceed with robotic/laparoscopic SG-Sleeve as a surgical weight loss option.  All other options were discussed in detail. The patient has undergone an extensive preoperative education and medical clearance along with dietary and psychiatric counseling.      Bariatric comorbidities present: hypertension and GERD  The following portions of the patient's history were reviewed and updated as appropriate: allergies, current medications, past family history, past medical history, past social history, past surgical history and problem list.  CURRENT PROBLEM LIST:   Patient Active Problem List   Diagnosis    Essential hypertension    Mixed hyperlipidemia    Pain in both hands    Pain in both feet    Arthralgia, unspecified joint    Chronic pain of both ankles    Psoriatic arthritis    Severe obesity (BMI 35.0-39.9) with comorbidity    Gastroesophageal reflux disease    Hiatal hernia    Vitamin D  deficiency     PAST MEDICAL HISTORY:   Past Medical History:   Diagnosis Date    Gastroesophageal reflux disease 10/23/2013    Acid reflux alot after meals. TUMS, PRILOSEC     Hyperlipidemia     Hypertension     well controlled    Left wrist fracture 2018    Psoriatic arthritis     LOWER BACK, LEFT HAND AND FINGERS     Urinary incontinence 12/21/2017    Over active bladder. TOVIAZ HELPS      PAST SURGICAL HISTORY:   Past Surgical History:   Procedure Laterality Date    BUNIONECTOMY Left     COLONOSCOPY      X 2     EGD N/A 05/18/2020    Procedure: EGD w/ bx's;  Surgeon: Sabas Sous, MD;  Location: [facility];  Service: Gastroenterology;  Laterality: N/A;    HYSTERECTOMY       partial     FAMILY HISTORY:    Family History   Problem Relation Age of Onset    Hypertension Mother     Hypertension Father     Hypertension Sister     Hypertension Brother     Migraines Neg Hx     Stroke Neg Hx     Seizures Neg Hx      SOCIAL HISTORY:   Social History     Socioeconomic History    Marital status: Single     Spouse name: None    Number  of children: None    Years of education: None    Highest education level: None   Occupational History    None   Tobacco Use    Smoking status: Never Smoker    Smokeless tobacco: Never Used    Tobacco comment: Never smoked or used tobacco   Vaping Use    Vaping Use: Never used   Substance and Sexual Activity    Alcohol use: Never     Comment:  once a month     Drug use: Never    Sexual activity: Yes     Partners: Male     Birth control/protection: None   Other Topics Concern    Dietary supplements / vitamins Yes    Anesthesia problems No    Blood thinners No    Pregnant No    Future Children No    Number of Pregnancies? Yes     Comment: 1    Number of children Yes     Comment: 1    Miscarriages / Abortions? No    Eats large amounts No    Excessive Sweets Yes    Skips meals No    Eats excessive starches Yes    Snacks or grazes Yes     Emotional eater No    Eats fried food Yes    Eats fast food Yes    Diet Center No    Doylene Bode Yes    LA Weight Loss No    Nutri-System Yes    Opti-Fast / Medi-Fast No    Overeaters Anonymous No    [facility] No    TOPS No    Weight Watchers Yes    Atkins No    Binging / Purging No    Calorie Counting Yes    Fasting No    High Protein Yes    Low Carb Yes    Low Fat Yes    [facility] Diet No    Slim Fast No    [place] Yes    Stationary cycle or treadmill No    Gym/fitness Classes No    Home exercise/video No    Swimming No    Weight training No    Walking or running Yes    Hospitalization No    Hypnosis No    Physical therapy No    Psychological therapy No    Residential program No    Acutrim No    [place] No    Contrave No    Dexatrim No    Diethylpropion No    Fastin No    Fen - Phen No    Ionamin / Adipex No    Phentermine No    Qsymia No    Prozac No    Saxenda No    Topamax No    Wellbutrin No    Xenical (Orlistat, Alli) No    Other Med No    No impairment No    Walks with cane/crutch No    Requires a wheelchair No    Bedridden No    Are you currently being treated for depression? No    Do you snore? Yes    Are you receiving any medical or psychological services? No    Do you ever wake up at night gasping for breath? Yes    Do you have or have you been treated for an eating disorder? No    Anyone ever told you that you stop breathing while  asleep? No    Do you exercise regularly? No    Have you or family member ever have trouble with anesthesia? Yes   Social History Narrative    None     Social Determinants of Health     Financial Resource Strain:     Difficulty of Paying Living Expenses:    Food Insecurity:     Worried About Programme researcher, broadcasting/film/video in the Last Year:     Barista in the Last Year:    Transportation Needs:     Freight forwarder (Medical):     Lack of Transportation (Non-Medical):    Physical  Activity:     Days of Exercise per Week:     Minutes of Exercise per Session:    Stress:     Feeling of Stress :    Social Connections:     Frequency of Communication with Friends and Family:     Frequency of Social Gatherings with Friends and Family:     Attends Religious Services:     Active Member of Clubs or Organizations:     Attends Engineer, structural:     Marital Status:    Intimate Partner Violence:     Fear of Current or Ex-Partner:     Emotionally Abused:     Physically Abused:     Sexually Abused:     (Does not refresh)  TOBACCO HISTORY:   Social History     Tobacco Use   Smoking Status Never Smoker   Smokeless Tobacco Never Used   Tobacco Comment    Never smoked or used tobacco     ALCOHOL HISTORY:   Social History     Substance and Sexual Activity   Alcohol Use Never    Comment:  once a month      DRUG HISTORY:   Social History     Substance and Sexual Activity   Drug Use Never     CURRENT HOSPITAL MEDICATIONS:   Current Outpatient Medications   Medication Sig Dispense Refill    atorvastatin (LIPITOR) 10 MG tablet TAKE 1 TABLET BY MOUTH EVERY DAY AT NIGHT 90 tablet 1    estradiol (ESTRACE) 0.1 MG/GM vaginal cream TWICE A WEEK        Fesoterodine Fumarate (Toviaz) 4 MG Tablet SR 24 hr 2 (two) times daily         lisinopril (ZESTRIL) 5 MG tablet Take 1 tablet (5 mg total) by mouth daily 90 tablet 3     No current facility-administered medications for this visit.     CURRENT OUTPATIENT MEDICATIONS:   Outpatient Medications Marked as Taking for the 07/20/20 encounter (Clinical Support) with Reneisha Stilley R, DO   Medication Sig Dispense Refill    atorvastatin (LIPITOR) 10 MG tablet TAKE 1 TABLET BY MOUTH EVERY DAY AT NIGHT 90 tablet 1    estradiol (ESTRACE) 0.1 MG/GM vaginal cream TWICE A WEEK        Fesoterodine Fumarate (Toviaz) 4 MG Tablet SR 24 hr 2 (two) times daily         lisinopril (ZESTRIL) 5 MG tablet Take 1 tablet (5 mg total) by mouth daily 90 tablet 3      ALLERGIES: No Known Allergies     Review of Systems  Constitutional: negative for fevers, night sweats  Respiratory: negative for SOB, cough  Cardiovascular: negative for chest pain, palpitations  Gastrointestinal: negative for nausea vomiting  Genitourinary:negative for hematuria, dysuria  Musculoskeletal:negative  for bone pain, myalgias and stiff joints  Neurological: negative for dizziness, gait problems, headaches and memory problems  Behavioral/Psych: negative for fatigue, loss of interest in favorite activities, separation anxiety and sleep disturbance  Endocrine: negative for temperature intolerance  Integumentary: No rashes, no skin infections    Objective:    BP 119/82 (BP Site: Left arm, Patient Position: Sitting, Cuff Size: Large)    Pulse 89    Temp (!) 96.6 F (35.9 C) (Temporal)    Ht 5\' 7"     Wt 237 lb    BMI 37.12 kg/m   Body mass index is 37.12 kg/m.  Weight: 237 lb     A female chaperone was present during the entire physical exam, Timoteo Ace   General Appearance:    Alert, cooperative, no distress, obese   Head:    Normocephalic, without obvious abnormality, atraumatic   Eyes:    No scleral icterus            Throat:   Lips, mucosa, and tongue normal; teeth and gums normal   Neck:   Supple, symmetrical, trachea midline, no adenopathy;        thyroid:  No enlargement/tenderness/nodules; no carotid    bruit or JVD   Back:     Symmetric, no curvature, ROM normal, no CVA tenderness   Lungs:     Clear to auscultation bilaterally, respirations unlabored   Chest wall:    No tenderness or deformity   Heart:    Regular rate and rhythm, S1 and S2 normal, no murmur, rub   or gallop   Abdomen:     Soft, non-tender, bowel sounds active all four quadrants,     no masses, no organomegaly   Extremities:   Extremities normal, atraumatic, no cyanosis or edema   Pulses:   2+ and symmetric all extremities   Skin:   Skin color, texture, turgor normal, no rashes or lesions   Lymph nodes:   Cervical,  supraclavicular, and axillary nodes normal   Neurologic:   Alert and oriented x 3.  Able to move all extremities.       Data Review:     Appointment on 06/09/2020   Component Date Value Ref Range Status    H. pylori Breath Test 06/09/2020 NOT DETECTED  NOT DETECTED Final    Comment:   Antimicrobials, proton pump inhibitors, and bismuth  preparations are known to suppress H. pylori, and  ingestion of these prior to H. pylori diagnostic  testing may lead to false negative results.  If clinically indicated, the test may be repeated  on a new specimen obtained two weeks after  discontinuing treatment. However, a positive result is  still clinically valid.    Test Performed by Clerance Lav,  [facility],  [address], [place], [place] [address]  Si Raider, M.D., Ph.D., Director of Laboratories  [phone], CLIA [id number]     Appointment on 06/09/2020   Component Date Value Ref Range Status    Vitamin A 06/09/2020 46.7  32.5 - 78.0 Final    Comment:   -------------------ADDITIONAL INFORMATION-------------------  This test was developed and its performance characteristics  determined by [facility] in a manner consistent with CLIA  requirements. This test has not been cleared or approved by  the U.S. Food and Drug Administration.    Test Performed by:  [facility]  [address] [place], [place], [place] [address]  Lab Director: Paul Dykes  M.D. Ph.D.; CLIA# [id number]      Vitamin B1 (Thiamine) 06/09/2020 9  8 - 30 nmol/L Final    Comment:   Vitamin supplementation within 24 hours prior to  blood draw may affect the accuracy of the results.    This test was developed and its analytical performance  characteristics have been determined by [facility] [place], [place]. It has  not been cleared or approved by the U.S. Food and Drug  Administration. This assay has been validated pursuant  to the CLIA regulations and is  used for clinical  purposes.    Test Performed by Clerance Lav,  [facility],  [address], [place], [place] [address]  Si Raider, M.D., Ph.D., Director of Laboratories  [phone], CLIA [id number]      Cholesterol 06/09/2020 208* 0 - 199 mg/dL Final    Triglycerides 06/09/2020 98  34 - 149 mg/dL Final    HDL 25/36/6440 63  40 - 9,999 mg/dL Final    Comment: An HDL cholesterol <40 mg/dL is low and constitutes a  coronary heart disease risk factor, and HDL-C>59 mg/dL is  a negative risk factor for CHD.  Ref: American Heart Association; Circulation 2004      LDL Calculated 06/09/2020 347* 0 - 99 mg/dL Final    VLDL Calculated 06/09/2020 20  10 - 40 mg/dL Final    Cholesterol / HDL Ratio 06/09/2020 3.3  See Below Final    Comment: Chol/HDL Ratio:  Classification                   Female     Female  Very Low (1/2 Average Risk)      <3.4     <3.3  Low Risk                         4.0      3.8  Average Risk                     5.0      4.5  Moderate Risk (2X Average risk)  9.5      7.0  High Risk (3X Average Risk)      >23.0    >11.0      Folate 06/09/2020 9.0  See below ng/mL Final    Comment: Deficient    : <3.5 ng/mL  Intermediate : 3.5 - 5.4 ng/mL  Normal       : Greater Than 5.4 ng/mL      Vitamin B-12 06/09/2020 528  211 - 911 pg/mL Final    WBC 06/09/2020 4.79  3.1 - 9.5 x10 3/uL Final    Hgb 06/09/2020 13.3  11.4 - 14.8 g/dL Final    Hematocrit 42/59/5638 43.4  34.7 - 43.7 % Final    Platelets 06/09/2020 177  142 - 346 x10 3/uL Final    RBC 06/09/2020 4.92  3.90 - 5.10 x10 6/uL Final    MCV 06/09/2020 88.2  78.0 - 96.0 fL Final    MCH 06/09/2020 27.0  25.1 - 33.5 pg Final    MCHC 06/09/2020 30.6* 31 - 35 g/dL Final    RDW 75/64/3329 14  11.0 - 15.0 % Final    MPV 06/09/2020 13.0* 8.9 - 12.5 fL Final    Nucleated RBC 06/09/2020 0.0  0.0 - 0.0 /100 WBC Final    Absolute NRBC 06/09/2020 0.00  0.00 - 0.00 x10  3/uL Final    Glucose 06/09/2020 90  70 - 100 mg/dL  Final    Comment: ADA guidelines for diabetes mellitus:  Fasting:  Equal to or greater than 126 mg/dL  Random:   Equal to or greater than 200 mg/dL      BUN 16/07/9603 54.0  7 - 19 mg/dL Final    Creatinine 98/08/9146 1.0  0.4 - 1.5 mg/dL Final    Sodium 82/95/6213 141  136 - 145 mEq/L Final    Potassium 06/09/2020 4.3  3.5 - 5.1 mEq/L Final    Chloride 06/09/2020 106  100 - 111 mEq/L Final    CO2 06/09/2020 27  21 - 29 mEq/L Final    Calcium 06/09/2020 9.3  8.5 - 10.5 mg/dL Final    Protein, Total 06/09/2020 7.2  6.0 - 8.3 g/dL Final    Albumin 08/65/7846 3.8  3.5 - 5.0 g/dL Final    AST (SGOT) 96/29/5284 30  5 - 34 U/L Final    ALT 06/09/2020 35  0 - 55 U/L Final    Alkaline Phosphatase 06/09/2020 92  37 - 106 U/L Final    Bilirubin, Total 06/09/2020 0.7  0.2 - 1.2 mg/dL Final    Globulin 13/24/4010 3.4  2.0 - 3.7 g/dL Final    Albumin/Globulin Ratio 06/09/2020 1.1  0.9 - 2.2 Final    Anion Gap 06/09/2020 8.0  5 - 15 Final    Comment: Calculated AGAP = Na - (CL + CO2)  Interpret with caution; calculated AGAP may not reflect patient's  true clinical status.      Iron 06/09/2020 105  40 - 145 ug/dL Final    UIBC 27/25/3664 166  126 - 382 ug/dL Final    TIBC 40/34/7425 271  265 - 497 ug/dL Final    Iron Saturation 06/09/2020 39  15 - 50 % Final    PTH Intact 06/09/2020 107.4* 9.0 - 72.0 pg/mL Final    Copper 06/09/2020 1.23  0.75 - 1.45 Final    Comment:   -------------------ADDITIONAL INFORMATION-------------------  This test was developed and its performance characteristics  determined by [facility] in a manner consistent with CLIA  requirements. This test has not been cleared or approved by  the U.S. Food and Drug Administration.    Test Performed by:  [facility]  [address] [place], [place], [place] [address]  Lab Director: Paul Dykes M.D. Ph.D.; CLIA# [id number]      Zinc 06/09/2020 0.86  0.66 - 1.10 Final    Comment:    -------------------ADDITIONAL INFORMATION-------------------  This test was developed and its performance characteristics  determined by [facility] in a manner consistent with CLIA  requirements. This test has not been cleared or approved by  the U.S. Food and Drug Administration.    Test Performed by:  [facility]  [address] [place], [place], [place] [address]  Lab Director: Paul Dykes M.D. Ph.D.; CLIA# [id number]      TSH 06/09/2020 2.04  0.35 - 4.94 uIU/mL Final    Vitamin D, 25 OH, Total 06/09/2020 16* 30 - 100 ng/mL Final    Comment: Vitamin D, 25-OH, Total: Testing performed using  ABBOTT Architect chemiluminescent microparticle  immunoassay methodology. Method-dependent minor difference  may exist based on the test platform used.  Vitamin D levels of <20 ng/ml are considered deficiency.  Vitamin D levels of 20-30 ng/ml suggest insufficiency.  Optimal Vitamin D levels are > or =  30 ng/ml.      PT 06/09/2020 11.5  10.1 - 12.9 sec Final    PT INR 06/09/2020 1.0  0.9 - 1.1 Final    Comment: Recommended Ranges for Protime INR:  2.0-3.0 for most medical and surgical thromboembolic states  7.8-2.9 for artificial heart valves INR result may not represent  exact Warfarin dosing level during the transition period from  Heparin to Warfarin therapy. Results should be interpreted based  on current anticoagulant therapy and patient's clinical presentation.      Hemoglobin A1C 06/09/2020 5.5  4.6 - 5.9 % Final    Comment: Test performed using Abbott Architect enzymatic method.  HBA1C: Hemoglobin A1c values of 5.7-6.4% indicate an increased  risk for developing diabetes mellitus. Hemoglobin A1c values  greater than or equal to 6.5% are diagnostic of diabetes  mellitus. A Triglyceride result greater than 3000 mg/dL can  falsely decrease FAO1H results.      Average Estimated Glucose 06/09/2020 111.2  mg/dL Final    Hemolysis Index 06/09/2020 1  0 - 18 Final    EGFR  06/09/2020 55.2   Final    Comment: Disease State Reference Ranges:    Chronic Kidney Disease; < 60 ml/min/1.73 sq.m    Kidney Failure; < 15 ml/min/1.73 sq.m    [Calculated using IDMS-Traceable MDRD equation (based on    gender, age and black vs. non-black race) recommended by    [employer] Kidney Disease Education Program. No data    available for non-white, non-black race.]    GFR estimates are unreliable in patients with:    Rapidly changing kidney function or recent dialysis,    extreme age, body size or body composition(obesity,    severe malnutrition). Abnormal muscle mass (limb    amputation, muscle wasting). In these patients,    alternative determinations of GFR should be obtained.     Office Visit on 05/13/2020   Component Date Value Ref Range Status    Glucose 05/13/2020 87  70 - 100 mg/dL Final    Comment: ADA guidelines for diabetes mellitus:  Fasting:  Equal to or greater than 126 mg/dL  Random:   Equal to or greater than 200 mg/dL      BUN 08/65/7846 96.2  7 - 19 mg/dL Final    Creatinine 95/28/4132 1.0  0.4 - 1.5 mg/dL Final    Sodium 44/10/270 143  136 - 145 mEq/L Final    Potassium 05/13/2020 4.4  3.5 - 5.1 mEq/L Final    Chloride 05/13/2020 108  100 - 111 mEq/L Final    CO2 05/13/2020 23  21 - 29 mEq/L Final    Calcium 05/13/2020 10.0  8.5 - 10.5 mg/dL Final    Protein, Total 05/13/2020 7.4  6.0 - 8.3 g/dL Final    Albumin 53/66/4403 4.0  3.5 - 5.0 g/dL Final    AST (SGOT) 47/42/5956 19  5 - 34 U/L Final    ALT 05/13/2020 16  0 - 55 U/L Final    Alkaline Phosphatase 05/13/2020 100  37 - 106 U/L Final    Bilirubin, Total 05/13/2020 0.7  0.2 - 1.2 mg/dL Final    Globulin 38/75/6433 3.4  2.0 - 3.7 g/dL Final    Albumin/Globulin Ratio 05/13/2020 1.2  0.9 - 2.2 Final    Anion Gap 05/13/2020 12.0  5 - 15 Final    Comment: Calculated AGAP = Na - (CL + CO2)  Interpret with caution; calculated AGAP may not reflect patient's  true clinical status.  Cholesterol 05/13/2020 216* 0 - 199  mg/dL Final    Triglycerides 05/13/2020 106  34 - 149 mg/dL Final    HDL 54/06/8118 63  40 - 9,999 mg/dL Final    Comment: An HDL cholesterol <40 mg/dL is low and constitutes a  coronary heart disease risk factor, and HDL-C>59 mg/dL is  a negative risk factor for CHD.  Ref: American Heart Association; Circulation 2004      LDL Calculated 05/13/2020 147* 0 - 99 mg/dL Final    VLDL Calculated 05/13/2020 21  10 - 40 mg/dL Final    Cholesterol / HDL Ratio 05/13/2020 3.4  See Below Final    Comment: Chol/HDL Ratio:  Classification                   Female     Female  Very Low (1/2 Average Risk)      <3.4     <3.3  Low Risk                         4.0      3.8  Average Risk                     5.0      4.5  Moderate Risk (2X Average risk)  9.5      7.0  High Risk (3X Average Risk)      >23.0    >11.0      WBC 05/13/2020 4.95  3.1 - 9.5 x10 3/uL Final    Hgb 05/13/2020 13.0  11.4 - 14.8 g/dL Final    Hematocrit 82/95/6213 42.6  34.7 - 43.7 % Final    Platelets 05/13/2020 165  142 - 346 x10 3/uL Final    RBC 05/13/2020 4.78  3.90 - 5.10 x10 6/uL Final    MCV 05/13/2020 89.1  78.0 - 96.0 fL Final    MCH 05/13/2020 27.2  25.1 - 33.5 pg Final    MCHC 05/13/2020 30.5* 31 - 35 g/dL Final    RDW 08/65/7846 14  11.0 - 15.0 % Final    MPV 05/13/2020 13.6* 8.9 - 12.5 fL Final    Neutrophils 05/13/2020 46.7  None % Final    Lymphocytes Automated 05/13/2020 43.8  None % Final    Monocytes 05/13/2020 7.3  None % Final    Eosinophils Automated 05/13/2020 1.2  None % Final    Basophils Automated 05/13/2020 0.6  None % Final    Immature Granulocytes 05/13/2020 0.4  None % Final    Nucleated RBC 05/13/2020 0.0  0.0 - 0.0 /100 WBC Final    Neutrophils Absolute 05/13/2020 2.31  1 - 6 x10 3/uL Final    Lymphocytes Absolute Automated 05/13/2020 2.17  0.42 - 3.22 x10 3/uL Final    Monocytes Absolute Automated 05/13/2020 0.36  0.21 - 0.85 x10 3/uL Final    Eosinophils Absolute Automated 05/13/2020 0.06  0.00 - 0.44 x10  3/uL Final    Basophils Absolute Automated 05/13/2020 0.03  0.00 - 0.08 x10 3/uL Final    Immature Granulocytes Absolute 05/13/2020 0.02  0.00 - 0.07 x10 3/uL Final    Absolute NRBC 05/13/2020 0.00  0.00 - 0.00 x10 3/uL Final    Hemolysis Index 05/13/2020 12  0 - 18 Final    EGFR 05/13/2020 55.2   Final    Comment: Disease State Reference Ranges:    Chronic Kidney Disease; < 60 ml/min/1.73 sq.m  Kidney Failure; < 15 ml/min/1.73 sq.m    [Calculated using IDMS-Traceable MDRD equation (based on    gender, age and black vs. non-black race) recommended by    [employer] Disease Education Program. No data    available for non-white, non-black race.]    GFR estimates are unreliable in patients with:    Rapidly changing kidney function or recent dialysis,    extreme age, body size or body composition(obesity,    severe malnutrition). Abnormal muscle mass (limb    amputation, muscle wasting). In these patients,    alternative determinations of GFR should be obtained.       .  Radiology Results for the past 90 days.   @[email or web address] @      Bach Rocchi R. Marvina Danner, DO, FACS, FASMBS, FACOS

## 2020-07-27 NOTE — Anesthesia Preprocedure Evaluation (Addendum)
Anesthesia Evaluation    AIRWAY    Mallampati: II    TM distance: >3 FB  Neck ROM: full  Mouth Opening:full   CARDIOVASCULAR    regular and normal       DENTAL    no notable dental hx     PULMONARY    clear to auscultation     OTHER FINDINGS                  Relevant Problems   CARDIO   (+) Essential hypertension      GI   (+) Gastroesophageal reflux disease   (+) Hiatal hernia      OTHER   (+) Psoriatic arthritis       PSS Anesthesia Comments: GERD, HTN,HLD; Labs-WNL; EKG- SR; CXR- No acute process; Medical clearance- Dr. Eben Burow.LS         Anesthesia Plan    ASA 2     general                     Detailed anesthesia plan: general endotracheal            informed consent obtained    Plan discussed with CRNA.    ECG reviewed  pertinent labs reviewed             Signed by: Helaine Chess 07/27/20 12:59 PM

## 2020-07-27 NOTE — Pre-Procedure Instructions (Signed)
Day Before Surgery Confirmation Call    Spoke to:pt  Left Message:    Confirmed surgery date, arrival time, and location.   Arrival: 0600  Surgery: 0800    NPO instructions reviewed: Clear liquids up to 2 hours prior to arrival time, then NPO. No solid food 8 hours prior to scheduled procedure time. Examples of clear liquids include water, apple juice, sports drinks such as Gatorade, coffee or tea without milk or cream. Sugar or sweetener may be added    Fasting Requirements per Preoperative Fasting Guidelines for Elective Surgeries and Procedures Requiring Anesthesia Policy Revised 02/2020  Ingested material Fasting requirement   Clear liquids/Ice Chips 2 hours prior to arrival time   Breast milk 4 hours prior to scheduled procedure time   Infant formula 6 hours prior to scheduled procedure time   Non-human milk 8 hours prior to scheduled procedure time   Solid food 8 hours prior to scheduled procedure time       Ambulatory Screening Tool:   Patient denies themselves or anyone in immediate family currently experiencing fever or any symptoms of acute respiratory illness (cough, or shortness of breath).  Patient denies travel outside of the [place] in the last month.   Patient denies being in close contact with a confirmed COVID19 patient.   Patient does not reside in a nursing home or long-term care facility.   Patient denies recent visit to ED, hospitalization or PCP visit for acute illness since PSS RN interview.     Patient instructed that if you or your family does develop new symptoms, to contact surgeons office immediately, prior to arrival at the hospital.     Visitor Restriction Guidelines per [facility] Policy as of 05/10/20:    All visitors must adhere to the following:   • Exhibit no COVID-19 symptoms   • Age 16+ (internally exceptions can be made by administrative team as needed, e.g., siblings, end of life situations, etc.)   • In keeping with the CDC’s current guidance, regardless of vaccination status,  everyone in a healthcare facility must wear a mask covering their mouth and nose the entire time they are in the facility. Visitors who fail to wear a mask properly will be asked to leave.   • The following face coverings cannot be worn at any [facility] location: gaiter style masks, bandanas or vented masks.   • No visitors are allowed for patients with suspected or confirmed COVID-19, except in end-of-life situations.    Hospital Inpatient:   Visitation hours: 9 a.m. - 6:30 p.m. daily   • Adult patients may have two visitors, in addition to a Designated Support Person (DSP), if applicable   • Pediatric patients may have two parents/guardians at bedside 24/7. For pediatric outpatient areas, only parents/guardians may visit  Outpatient/Ambulatory Surgery:   • Adult patients may have one visitor, in addition to their Designated Support Person (DSP) on the day of surgery.   • No family members will be allowed into Phase 1 recovery areas. Physicians will call contact person to give report of procedure.   • Family / visitor will be called by PACU staff  to review discharge instructions via phone and answer any questions.       Advise to call surgeon if need to cancel surgery arises.     Patient verbalized understanding and acceptance of above information.  If a voicemail is left for the patient and any of the COVID19 ambulatory screening questions are positive, patient is asked   to call PSS ASAP.

## 2020-07-28 ENCOUNTER — Inpatient Hospital Stay: Payer: Medicare Other | Admitting: Acute Care

## 2020-07-28 ENCOUNTER — Encounter
Admission: RE | Disposition: A | Discharge status: Home / Self Care | Payer: Self-pay | Source: Ambulatory Visit | Attending: Surgery

## 2020-07-28 ENCOUNTER — Inpatient Hospital Stay
Admission: RE | Admit: 2020-07-28 | Discharge: 2020-07-29 | DRG: 621 | Disposition: A | Discharge status: Home / Self Care | Payer: Medicare Other | Source: Ambulatory Visit | Attending: Surgery | Admitting: Surgery

## 2020-07-28 DIAGNOSIS — K449 Diaphragmatic hernia without obstruction or gangrene: Secondary | ICD-10-CM | POA: Diagnosis present

## 2020-07-28 DIAGNOSIS — Z6835 Body mass index (BMI) 35.0-35.9, adult: Secondary | ICD-10-CM

## 2020-07-28 DIAGNOSIS — K3189 Other diseases of stomach and duodenum: Secondary | ICD-10-CM

## 2020-07-28 DIAGNOSIS — Z6837 Body mass index (BMI) 37.0-37.9, adult: Secondary | ICD-10-CM

## 2020-07-28 DIAGNOSIS — E559 Vitamin D deficiency, unspecified: Secondary | ICD-10-CM | POA: Diagnosis present

## 2020-07-28 DIAGNOSIS — E782 Mixed hyperlipidemia: Secondary | ICD-10-CM | POA: Diagnosis present

## 2020-07-28 DIAGNOSIS — Z9884 Bariatric surgery status: Secondary | ICD-10-CM

## 2020-07-28 DIAGNOSIS — I10 Essential (primary) hypertension: Secondary | ICD-10-CM | POA: Diagnosis present

## 2020-07-28 DIAGNOSIS — L4050 Arthropathic psoriasis, unspecified: Secondary | ICD-10-CM | POA: Diagnosis present

## 2020-07-28 DIAGNOSIS — K219 Gastro-esophageal reflux disease without esophagitis: Secondary | ICD-10-CM | POA: Diagnosis present

## 2020-07-28 DIAGNOSIS — E785 Hyperlipidemia, unspecified: Secondary | ICD-10-CM

## 2020-07-28 DIAGNOSIS — Z79899 Other long term (current) drug therapy: Secondary | ICD-10-CM

## 2020-07-28 DIAGNOSIS — N3281 Overactive bladder: Secondary | ICD-10-CM | POA: Diagnosis present

## 2020-07-28 HISTORY — PX: LAPAROSCOPIC GASTRECTOMY SLEEVE WITH HIATAL HERNIA: SHX6545

## 2020-07-28 LAB — ABO/RH: ABO Rh: A POS

## 2020-07-28 LAB — TYPE AND SCREEN
AB Screen Gel: NEGATIVE
ABO Rh: A POS

## 2020-07-28 SURGERY — LAPAROSCOPIC GASTRECTOMY SLEEVE WITH HIATAL HERNIA
Anesthesia: Anesthesia General | Site: Abdomen | Wound class: Clean Contaminated

## 2020-07-28 MED ORDER — LACTATED RINGERS IV SOLN
INTRAVENOUS | Status: DC
Start: 2020-07-28 — End: 2020-07-29

## 2020-07-28 MED ORDER — GLYCOPYRROLATE 0.2 MG/ML IJ SOLN (WRAP)
INTRAMUSCULAR | Status: DC | PRN
Start: 2020-07-28 — End: 2020-07-28
  Administered 2020-07-28: .8 mg via INTRAVENOUS

## 2020-07-28 MED ORDER — ONDANSETRON HCL 4 MG/2ML IJ SOLN
4.0000 mg | Freq: Once | INTRAMUSCULAR | Status: DC | PRN
Start: 2020-07-28 — End: 2020-07-28

## 2020-07-28 MED ORDER — NEOSTIGMINE METHYLSULFATE 1 MG/ML IJ/IV SOLN (WRAP)
Status: DC | PRN
Start: 2020-07-28 — End: 2020-07-28
  Administered 2020-07-28: 4 mg via INTRAVENOUS

## 2020-07-28 MED ORDER — DEXMEDETOMIDINE HCL IN NACL 200 MCG/50ML IV SOLN
INTRAVENOUS | Status: DC | PRN
Start: 2020-07-28 — End: 2020-07-28
  Administered 2020-07-28: .5 ug/kg/h via INTRAVENOUS

## 2020-07-28 MED ORDER — ACETAMINOPHEN 10 MG/ML IV SOLN
1000.0000 mg | INTRAVENOUS | Status: AC
Start: 2020-07-28 — End: 2020-07-28
  Administered 2020-07-28: 1000 mg via INTRAVENOUS

## 2020-07-28 MED ORDER — DIPHENHYDRAMINE HCL 50 MG/ML IJ SOLN
INTRAMUSCULAR | Status: DC | PRN
Start: 2020-07-28 — End: 2020-07-28
  Administered 2020-07-28: 12.5 mg via INTRAVENOUS

## 2020-07-28 MED ORDER — PHENYLEPHRINE HCL 10 MG/ML IV SOLN (WRAP)
Status: DC | PRN
Start: 2020-07-28 — End: 2020-07-28
  Administered 2020-07-28 (×3): 100 ug via INTRAVENOUS
  Administered 2020-07-28: 200 ug via INTRAVENOUS

## 2020-07-28 MED ORDER — GABAPENTIN 50 MG/ML UNIT DOSE
600.0000 mg | ORAL | Status: AC
Start: 2020-07-28 — End: 2020-07-28
  Administered 2020-07-28: 600 mg via ORAL

## 2020-07-28 MED ORDER — DEXTROSE 5 % IV SOLN
2.0000 g | INTRAVENOUS | Status: AC
Start: 2020-07-28 — End: 2020-07-28
  Administered 2020-07-28: 3 g via INTRAVENOUS
  Filled 2020-07-28: qty 2000

## 2020-07-28 MED ORDER — SCOPOLAMINE 1 MG/3DAYS TD PT72
1.0000 | MEDICATED_PATCH | TRANSDERMAL | Status: DC | PRN
Start: 2020-07-28 — End: 2020-07-29

## 2020-07-28 MED ORDER — ROCURONIUM BROMIDE 50 MG/5ML IV SOLN
INTRAVENOUS | Status: AC
Start: 2020-07-28 — End: ?
  Filled 2020-07-28: qty 5

## 2020-07-28 MED ORDER — GLUCAGON 1 MG IJ SOLR (WRAP)
1.0000 mg | INTRAMUSCULAR | Status: DC | PRN
Start: 2020-07-28 — End: 2020-07-29

## 2020-07-28 MED ORDER — LIDOCAINE HCL 2 % IJ SOLN
INTRAMUSCULAR | Status: AC
Start: 2020-07-28 — End: ?
  Filled 2020-07-28: qty 20

## 2020-07-28 MED ORDER — SODIUM CHLORIDE 0.9 % IR SOLN
Status: DC | PRN
Start: 2020-07-28 — End: 2020-07-28
  Administered 2020-07-28: 1000 mL

## 2020-07-28 MED ORDER — DIPHENHYDRAMINE HCL 50 MG/ML IJ SOLN
12.5000 mg | INTRAMUSCULAR | Status: DC | PRN
Start: 2020-07-28 — End: 2020-07-28

## 2020-07-28 MED ORDER — SUCCINYLCHOLINE CHLORIDE 20 MG/ML IJ SOLN
INTRAMUSCULAR | Status: AC
Start: 2020-07-28 — End: ?
  Filled 2020-07-28: qty 10

## 2020-07-28 MED ORDER — ONDANSETRON HCL 4 MG/2ML IJ SOLN
4.0000 mg | Freq: Three times a day (TID) | INTRAMUSCULAR | Status: DC | PRN
Start: 2020-07-28 — End: 2020-07-29
  Administered 2020-07-28: 4 mg via INTRAVENOUS
  Filled 2020-07-28: qty 2

## 2020-07-28 MED ORDER — OXYCODONE HCL 5 MG/5ML PO SOLN
5.0000 mg | ORAL | Status: DC | PRN
Start: 2020-07-29 — End: 2020-07-29

## 2020-07-28 MED ORDER — NON FORMULARY
1000.0000 mg | Freq: Four times a day (QID) | Status: DC
Start: 2020-07-28 — End: 2020-07-28

## 2020-07-28 MED ORDER — ROCURONIUM BROMIDE 10 MG/ML IV SOLN (WRAP)
INTRAVENOUS | Status: DC | PRN
Start: 2020-07-28 — End: 2020-07-28
  Administered 2020-07-28: 45 mg via INTRAVENOUS
  Administered 2020-07-28: 5 mg via INTRAVENOUS
  Administered 2020-07-28: 10 mg via INTRAVENOUS

## 2020-07-28 MED ORDER — SIMETHICONE 80 MG PO CHEW
80.0000 mg | CHEWABLE_TABLET | Freq: Four times a day (QID) | ORAL | Status: DC | PRN
Start: 2020-07-29 — End: 2020-07-29
  Administered 2020-07-29: 80 mg via ORAL
  Filled 2020-07-28: qty 1

## 2020-07-28 MED ORDER — MIDAZOLAM HCL 1 MG/ML IJ SOLN (WRAP)
INTRAMUSCULAR | Status: DC | PRN
Start: 2020-07-28 — End: 2020-07-28
  Administered 2020-07-28: 2 mg via INTRAVENOUS

## 2020-07-28 MED ORDER — PROMETHAZINE HCL 25 MG/ML IJ SOLN
6.2500 mg | Freq: Once | INTRAMUSCULAR | Status: DC | PRN
Start: 2020-07-28 — End: 2020-07-28

## 2020-07-28 MED ORDER — FENTANYL CITRATE (PF) 50 MCG/ML IJ SOLN (WRAP)
INTRAMUSCULAR | Status: DC | PRN
Start: 2020-07-28 — End: 2020-07-28
  Administered 2020-07-28: 100 ug via INTRAVENOUS

## 2020-07-28 MED ORDER — MIDAZOLAM HCL 1 MG/ML IJ SOLN (WRAP)
INTRAMUSCULAR | Status: AC
Start: 2020-07-28 — End: ?
  Filled 2020-07-28: qty 2

## 2020-07-28 MED ORDER — FAMOTIDINE 10 MG/ML IV SOLN (WRAP)
20.0000 mg | INTRAVENOUS | Status: AC
Start: 2020-07-28 — End: 2020-07-28
  Administered 2020-07-28: 20 mg via INTRAVENOUS

## 2020-07-28 MED ORDER — ENOXAPARIN SODIUM 40 MG/0.4ML SC SOLN
40.0000 mg | Freq: Two times a day (BID) | SUBCUTANEOUS | Status: DC
Start: 2020-07-29 — End: 2020-07-29
  Administered 2020-07-29: 40 mg via SUBCUTANEOUS
  Filled 2020-07-28: qty 0.4

## 2020-07-28 MED ORDER — DEXTROSE 50 % IV SOLN
12.5000 g | INTRAVENOUS | Status: DC | PRN
Start: 2020-07-28 — End: 2020-07-29

## 2020-07-28 MED ORDER — LISINOPRIL 5 MG PO TABS
5.0000 mg | ORAL_TABLET | Freq: Every day | ORAL | Status: DC
Start: 2020-07-29 — End: 2020-07-29
  Administered 2020-07-29: 5 mg via ORAL
  Filled 2020-07-28: qty 1

## 2020-07-28 MED ORDER — SODIUM CHLORIDE 0.9 % IV MBP
1.0000 g | INTRAVENOUS | Status: DC
Start: 2020-07-28 — End: 2020-07-28

## 2020-07-28 MED ORDER — PROPOFOL 10 MG/ML IV EMUL (WRAP)
INTRAVENOUS | Status: AC
Start: 2020-07-28 — End: ?
  Filled 2020-07-28: qty 20

## 2020-07-28 MED ORDER — ACETAMINOPHEN 10 MG/ML IV SOLN
1000.0000 mg | Freq: Four times a day (QID) | INTRAVENOUS | Status: AC
Start: 2020-07-28 — End: 2020-07-28
  Administered 2020-07-28 (×2): 1000 mg via INTRAVENOUS
  Filled 2020-07-28 (×2): qty 100

## 2020-07-28 MED ORDER — DEXMEDETOMIDINE HCL IN NACL 200 MCG/50ML IV SOLN
INTRAVENOUS | Status: AC
Start: 2020-07-28 — End: ?
  Filled 2020-07-28: qty 50

## 2020-07-28 MED ORDER — DIPHENHYDRAMINE HCL 50 MG/ML IJ SOLN
12.5000 mg | Freq: Four times a day (QID) | INTRAMUSCULAR | Status: DC | PRN
Start: 2020-07-28 — End: 2020-07-29

## 2020-07-28 MED ORDER — SUCCINYLCHOLINE CHLORIDE 20 MG/ML IJ SOLN
INTRAMUSCULAR | Status: DC | PRN
Start: 2020-07-28 — End: 2020-07-28
  Administered 2020-07-28: 120 mg via INTRAVENOUS

## 2020-07-28 MED ORDER — ENOXAPARIN SODIUM 30 MG/0.3ML SC SOLN
SUBCUTANEOUS | Status: AC
Start: 2020-07-28 — End: ?
  Filled 2020-07-28: qty 0.3

## 2020-07-28 MED ORDER — MEPERIDINE HCL 25 MG/ML IJ SOLN
25.0000 mg | Freq: Once | INTRAMUSCULAR | Status: DC | PRN
Start: 2020-07-28 — End: 2020-07-28

## 2020-07-28 MED ORDER — SODIUM CHLORIDE BACTERIOSTATIC 0.9 % IJ SOLN
INTRAMUSCULAR | Status: DC | PRN
Start: 2020-07-28 — End: 2020-07-28
  Administered 2020-07-28: 10 mL

## 2020-07-28 MED ORDER — HYDRALAZINE HCL 20 MG/ML IJ SOLN
10.0000 mg | Freq: Four times a day (QID) | INTRAMUSCULAR | Status: DC | PRN
Start: 2020-07-28 — End: 2020-07-29

## 2020-07-28 MED ORDER — GLUCOSE 40 % PO GEL
15.0000 g | ORAL | Status: DC | PRN
Start: 2020-07-28 — End: 2020-07-29

## 2020-07-28 MED ORDER — FENTANYL CITRATE (PF) 50 MCG/ML IJ SOLN (WRAP)
INTRAMUSCULAR | Status: AC
Start: 2020-07-28 — End: ?
  Filled 2020-07-28: qty 2

## 2020-07-28 MED ORDER — BUPIVACAINE HCL (PF) 0.5 % IJ SOLN
INTRAMUSCULAR | Status: AC
Start: 2020-07-28 — End: ?
  Filled 2020-07-28: qty 30

## 2020-07-28 MED ORDER — GABAPENTIN 250 MG/5ML PO SOLN
ORAL | Status: AC
Start: 2020-07-28 — End: ?
  Filled 2020-07-28: qty 12

## 2020-07-28 MED ORDER — POTASSIUM CHLORIDE IN NACL 20-0.9 MEQ/L-% IV SOLN
INTRAVENOUS | Status: DC
Start: 2020-07-28 — End: 2020-07-29

## 2020-07-28 MED ORDER — BUPIVACAINE LIPOSOME 1.3 % IJ SUSP
20.0000 mL | Freq: Once | INTRAMUSCULAR | Status: AC
Start: 2020-07-28 — End: 2020-07-28
  Administered 2020-07-28: 20 mL

## 2020-07-28 MED ORDER — ONDANSETRON HCL 4 MG/2ML IJ SOLN
INTRAMUSCULAR | Status: DC | PRN
Start: 2020-07-28 — End: 2020-07-28
  Administered 2020-07-28: 4 mg via INTRAVENOUS

## 2020-07-28 MED ORDER — BUPIVACAINE LIPOSOME 1.3 % IJ SUSP
INTRAMUSCULAR | Status: AC
Start: 2020-07-28 — End: ?
  Filled 2020-07-28: qty 20

## 2020-07-28 MED ORDER — FAMOTIDINE 10 MG/ML IV SOLN (WRAP)
20.0000 mg | Freq: Two times a day (BID) | INTRAVENOUS | Status: DC
Start: 2020-07-28 — End: 2020-07-29
  Administered 2020-07-28 – 2020-07-29 (×2): 20 mg via INTRAVENOUS
  Filled 2020-07-28 (×2): qty 2

## 2020-07-28 MED ORDER — HYDROMORPHONE HCL 0.5 MG/0.5 ML IJ SOLN
0.5000 mg | INTRAMUSCULAR | Status: DC | PRN
Start: 2020-07-28 — End: 2020-07-28
  Administered 2020-07-28: 0.5 mg via INTRAVENOUS
  Filled 2020-07-28: qty 0.5

## 2020-07-28 MED ORDER — CEFAZOLIN SODIUM 1 G IJ SOLR
INTRAMUSCULAR | Status: AC
Start: 2020-07-28 — End: ?
  Filled 2020-07-28: qty 3000

## 2020-07-28 MED ORDER — FENTANYL CITRATE (PF) 50 MCG/ML IJ SOLN (WRAP)
25.0000 ug | INTRAMUSCULAR | Status: DC | PRN
Start: 2020-07-28 — End: 2020-07-28

## 2020-07-28 MED ORDER — SODIUM CHLORIDE (PF) 0.9 % IJ SOLN
INTRAMUSCULAR | Status: AC
Start: 2020-07-28 — End: ?
  Filled 2020-07-28: qty 10

## 2020-07-28 MED ORDER — ACETAMINOPHEN 160 MG/5ML PO SOLN
650.0000 mg | ORAL | Status: DC
Start: 2020-07-29 — End: 2020-07-29
  Administered 2020-07-29 (×2): 650 mg via ORAL
  Filled 2020-07-28 (×2): qty 20.3

## 2020-07-28 MED ORDER — GABAPENTIN 300 MG PO CAPS
300.0000 mg | ORAL_CAPSULE | Freq: Three times a day (TID) | ORAL | Status: DC
Start: 2020-07-29 — End: 2020-07-29
  Administered 2020-07-29: 300 mg via ORAL
  Filled 2020-07-28: qty 1

## 2020-07-28 MED ORDER — ALBUTEROL SULFATE 1.25 MG/3ML IN NEBU
1.2500 mg | INHALATION_SOLUTION | RESPIRATORY_TRACT | Status: DC | PRN
Start: 2020-07-28 — End: 2020-07-29

## 2020-07-28 MED ORDER — PROCHLORPERAZINE EDISYLATE 10 MG/2ML IJ SOLN
5.0000 mg | Freq: Four times a day (QID) | INTRAMUSCULAR | Status: DC | PRN
Start: 2020-07-28 — End: 2020-07-29

## 2020-07-28 MED ORDER — BUPIVACAINE-EPINEPHRINE 0.5% -1:200000 IJ SOLN
INTRAMUSCULAR | Status: DC | PRN
Start: 2020-07-28 — End: 2020-07-28
  Administered 2020-07-28: 30 mL

## 2020-07-28 MED ORDER — ENOXAPARIN SODIUM 30 MG/0.3ML SC SOLN
30.0000 mg | Freq: Once | SUBCUTANEOUS | Status: AC
Start: 2020-07-28 — End: 2020-07-28
  Administered 2020-07-28: 30 mg via SUBCUTANEOUS

## 2020-07-28 MED ORDER — PROPOFOL INFUSION 10 MG/ML
INTRAVENOUS | Status: DC | PRN
Start: 2020-07-28 — End: 2020-07-28
  Administered 2020-07-28: 200 mg via INTRAVENOUS

## 2020-07-28 MED ORDER — LIDOCAINE HCL (PF) 1 % IJ SOLN
INTRAMUSCULAR | Status: DC | PRN
Start: 2020-07-28 — End: 2020-07-28
  Administered 2020-07-28: 100 mg via INTRAVENOUS

## 2020-07-28 MED ORDER — EPINEPHRINE HCL 1 MG/ML IJ SOLN (WRAP)
Status: AC
Start: 2020-07-28 — End: ?
  Filled 2020-07-28: qty 1

## 2020-07-28 MED ORDER — AMMONIA AROMATIC IN INHA
1.0000 | Freq: Once | RESPIRATORY_TRACT | Status: DC | PRN
Start: 2020-07-28 — End: 2020-07-28

## 2020-07-28 MED ORDER — DIPHENHYDRAMINE HCL 50 MG/ML IJ SOLN
INTRAMUSCULAR | Status: AC
Start: 2020-07-28 — End: ?
  Filled 2020-07-28: qty 1

## 2020-07-28 MED ORDER — FAMOTIDINE 20 MG/2ML IV SOLN
INTRAVENOUS | Status: AC
Start: 2020-07-28 — End: ?
  Filled 2020-07-28: qty 2

## 2020-07-28 MED ORDER — ACETAMINOPHEN 10 MG/ML IV SOLN
INTRAVENOUS | Status: AC
Start: 2020-07-28 — End: ?
  Filled 2020-07-28: qty 100

## 2020-07-28 MED ORDER — HYDROMORPHONE HCL 1 MG/ML IJ SOLN
1.0000 mg | INTRAMUSCULAR | Status: DC | PRN
Start: 2020-07-28 — End: 2020-07-29
  Administered 2020-07-28 – 2020-07-29 (×3): 1 mg via INTRAVENOUS
  Filled 2020-07-28 (×3): qty 1

## 2020-07-28 MED ORDER — ATORVASTATIN CALCIUM 10 MG PO TABS
10.0000 mg | ORAL_TABLET | Freq: Every evening | ORAL | Status: DC
Start: 2020-07-29 — End: 2020-07-29

## 2020-07-28 MED ORDER — LACTATED RINGERS IV SOLN
INTRAVENOUS | Status: AC | PRN
Start: 2020-07-28 — End: 2020-07-28
  Administered 2020-07-28: 1000 mL

## 2020-07-28 MED ORDER — HYOSCYAMINE SULFATE 0.125 MG SL SUBL
0.1250 mg | SUBLINGUAL_TABLET | Freq: Four times a day (QID) | SUBLINGUAL | Status: DC | PRN
Start: 2020-07-28 — End: 2020-07-29

## 2020-07-28 SURGICAL SUPPLY — 125 items
BULB VISIGI 3D (Procedure Accessories)
CANULA STABILITY 5MM (Procedure Accessories) ×2
CLIP SUTURE ABSORBABLE LAPRA-TY VICRYL (Suture) ×1
CLIP SUTURE ABSORBABLE LAPRA-TY VICRYL POLYDIOXANONE (Suture) ×1 IMPLANT
DEVICE LIGASURE MD JAW 44CM (Laparoscopy Supplies) ×1
DEVICE SUTUREURING ENDO STITCH (Laparoscopy Supplies) ×1
DEVICE SUTURING L15 IN PUSH BUTTON GRIP (Laparoscopy Supplies) ×1
DEVICE SUTURING L15 IN PUSH BUTTON GRP HNDL 10MM ENDO STTCH ENDOSCOPIC (Laparoscopy Supplies) ×1 IMPLANT
DRAPE SURG 3/4 53X77IN (Drape) ×2
DRAPE SURGICAL SHEET L77 IN X W53 IN (Drape) ×2
DRAPE SURGICAL SHEET L77 IN X W53 IN MEDLINE SMS 3/4 BLUE (Drape) ×2 IMPLANT
DRESSING TELFA 3X8IN STERILE (Dressing) ×2 IMPLANT
ENDOSTITCH SILK 2-0 48IN (Suture) ×3
GLOVE SURG BIOGEL LF SZ8 (Glove) ×2
GLOVE SURG BIOGEL PF LTX SZ7.0 (Glove) ×1
GLOVE SURG INDCTR STRL SZ 8 RT (Glove) ×2
GLOVE SURGICAL 7 1/2 BIOGEL PI INDICATOR (Glove) ×1
GLOVE SURGICAL 7 1/2 BIOGEL PI INDICATOR UNDERGLOVE POWDER FREE SMOOTH (Glove) ×1 IMPLANT
GLOVE SURGICAL 7 BIOGEL PI INDICATOR (Glove) ×1
GLOVE SURGICAL 7 BIOGEL PI INDICATOR UNDERGLOVE POWDER FREE SMOOTH (Glove) ×1 IMPLANT
GLOVE SURGICAL 7 BIOGEL SURGEONS POWDER (Glove) ×1
GLOVE SURGICAL 7 BIOGEL SURGEONS POWDER FREE BEAD CUFF TEXTURE SURFACE (Glove) ×1 IMPLANT
GLOVE SURGICAL 8 BIOGEL PI INDICATOR (Glove) ×2
GLOVE SURGICAL 8 BIOGEL PI INDICATOR UNDERGLOVE POWDER FREE SMOOTH (Glove) ×2 IMPLANT
GLOVE SURGICAL 8 BIOGEL SURGEONS POWDER (Glove) ×2
GLOVE SURGICAL 8 BIOGEL SURGEONS POWDER FREE BEAD CUFF TEXTURE SURFACE (Glove) ×2 IMPLANT
HANDLE LIGHT ADAPTIVE LIGHT CONTROL PLUS (Other) ×1
HANDLE LIGHT ADAPTIVE LIGHT CONTROL PLUS TECHNOLOGY SNAP ON LENS TOUCH (Other) ×1 IMPLANT
HANDLE TRUMPF LIGHT  DISP (Other) ×1
HOLDER ENDOSCOPIC INSTRUMENT TUBE SET (Endoscopic Supplies) ×1
HOLDER ENDOSCOPIC INSTRUMENT TUBE SET ACTIVATION BUTTON WINGMAN (Endoscopic Supplies) ×1 IMPLANT
IRRIGATOR SUCTION ERGONOMIC HAND PIECE STRYKEFLOW II (Suction) ×1 IMPLANT
IRRIGATOR SUCTION STRYKEFLOW 2 (Suction) ×1
IRRIGATOR SUCTN PUMP/HANDPIECE (Suction) ×1
KIT ADV LAPSCPC CARE (Kits) ×1
KIT INFECTION CONTROL CUSTOM (Kits) ×2
KIT INFECTION CONTROL CUSTOM IFOH03 (Kits) ×1 IMPLANT
LACTATED RINGERS 1000ML (IV Solutions) ×1
MAT PATIENT L81 IN X W39 IN M2 (Positioning Supplies) ×1
MAT PATIENT L81 IN X W39 IN M2 MICROCLIMATE BODY PAD PREVALON MOBILE (Positioning Supplies) ×1 IMPLANT
MATRIX SEAMGRD BIOABSRB TRI60P (Sealant) ×1 IMPLANT
NEEDLE SPINAL 20G X 3.5IN (Needles) ×1
NEEDLE SPINAL L3 1/2 IN REGULAR WALL QUINCKE TIP OD20 GA BD (Needles) ×1 IMPLANT
NEEDLE SPNL PP RW BD QNCK 20GA 3.5IN LF (Needles) ×1
PACK SURGICAL GEN LAPAROSCOPY FFX (Pack) ×1 IMPLANT
PASSER SUTURE ONLY WECK EFX (Procedure Accessories) ×1
POUCH SPECIMEN ENDO 15MM CATCH (Laparoscopy Supplies) ×1
POUCH SPECIMEN RETRIEVAL L34.5 CM (Laparoscopy Supplies) ×1
POUCH SPECIMEN RETRIEVAL L34.5 CM FLEXIBLE LONG CYLINDRICAL TUBE OD15 (Laparoscopy Supplies) ×1 IMPLANT
PREVALON MAT SYSTEM QC BLEND (Positioning Supplies) ×1
REINFORCEMENT STAPLE LINE BIOABSORBABLE (Sealant) ×1 IMPLANT
REINFORCEMENT STAPLE LINE BIOABSORBABLE GORE SEAMGUARD PURPLE ENDO GIA (Sealant) ×1 IMPLANT
RELOAD STAPLER EXTRA THICK L60 MM ENDO (Staplers) ×2
RELOAD STAPLER EXTRA THICK L60 MM ENDO GIA REINFORCE BLACK (Staplers) ×2 IMPLANT
RELOAD STAPLER MEDIUM CURVE L60 MM (Staplers) ×1
RELOAD STAPLER MEDIUM CURVE L60 MM TRI-STAPLE 2.0 VASCULAR TAN (Staplers) ×1 IMPLANT
RELOAD STAPLER MEDIUM THICK CURVE L60 MM (Staplers) ×1
RELOAD STAPLER MEDIUM THICK CURVE L60 MM TRI-STAPLE 2.0 PURPLE (Staplers) ×1 IMPLANT
RELOAD STAPLER MEDIUM THICK L60 MM (Staplers) ×2
RELOAD STAPLER MEDIUM THICK L60 MM TRI-STAPLE 2.0 REINFORCE PURPLE (Staplers) ×2 IMPLANT
RELOAD SUTURE ASSISTANT L48 IN 0 ES-9 (Laparoscopy Supplies) ×3
RELOAD SUTURE ASSISTANT L48 IN 0 ES-9 (Suture)
RELOAD SUTURE ASSISTANT L48 IN 0 ES-9 ENDO STTCH SRGDC LACTOMER GRN (Laparoscopy Supplies) ×3 IMPLANT
RELOAD SUTURE ASSISTANT L48 IN 0 ES-9 NONABSORBABLE NONMUTAGENIC ENDO (Suture) IMPLANT
RELOAD SUTURE ASSISTANT L48 IN 2-0 ES-9 (Suture) ×3
RELOAD SUTURE ASSISTANT L48 IN 2-0 ES-9 1 STITCH ENDO STITCH POLYSORB (Suture) ×3 IMPLANT
RELOAD TRI-STPLE 2.0 RNFRC 60M (Staplers) ×2
SCISSOR ENDOCUT DISP LAPO (Instrument) ×1
SEALER/DIVIDER LAPAROSCOPIC L44 CM 350 D (Laparoscopy Supplies) ×1
SEALER/DIVIDER LAPAROSCOPIC L44 CM 350 D L18.5 MM MARYLAND CURVE JAW (Laparoscopy Supplies) ×1 IMPLANT
SET HIGH FLOW SMOKE EVACUATION (Tubing) ×1
SET HIGH FLOW SMOKE EVACUATION PNEUMOCLEAR TUBING (Tubing) ×1 IMPLANT
SET TUBE W/BTN F/SCOPE HLDR (Endoscopic Supplies) ×1
SLEEVE LAPAROSCOPIC L100 MM UNIVERSAL (Procedure Accessories) ×2
SLEEVE LAPSCP UNV EPTH XCL 5MM 100MM (Procedure Accessories) ×2
SLEEVE TROCAR L100 MM UNIVERSAL STABILITY OD5 MM ENDOPATH XCEL (Procedure Accessories) ×2 IMPLANT
SOLUTION IV LACTATED RINGERS 1000 ML (IV Solutions) ×1
SOLUTION IV LACTATED RINGERS 1000 ML PLASTIC CONTAINER (IV Solutions) ×1 IMPLANT
SPONGE GAUZE L4 IN X W4 IN 12 PLY (Sponge) ×1
SPONGE GAUZE L4 IN X W4 IN 12 PLY CRINKLE WEAVE FLUFF WOVEN KERLIX (Sponge) ×1 IMPLANT
SPONGE GZE STR 2'S 12PLY 4X4 (Sponge) ×1
SPONGE LAP XRAY 18X18IN (Sponge) ×1
SPONGE LAPAROTOMY L18 IN X W18 IN (Sponge) ×1
SPONGE LAPAROTOMY L18 IN X W18 IN PREWASH WHITE (Sponge) ×1 IMPLANT
STAPLE RELOAD ARTIC CRVD 60MM (Staplers) ×1
STAPLE TRI-STAPLE 2.0 60MM BLK (Staplers) ×2
STAPLER ENDOGIA ART MED 60MM (Staplers) ×1
SUT VICRYL 0 54IN (Suture) ×1
SUTURE COATED VICRYL 0 L54 IN BRAID TIES (Suture) ×1 IMPLANT
SUTURE COATED VICRYL 2-0 SH L27 IN BRAID (Suture) ×4
SUTURE COATED VICRYL 2-0 SH L27 IN BRAID COATED VIOLET ABSORBABLE (Suture) ×4 IMPLANT
SUTURE ENDOSTITCH SILK 0 48IN (Suture)
SUTURE ETHIBOND 2-0 SH 30IN (Suture) ×2
SUTURE ETHIBOND EXCEL GREEN 2-0 SH L30 (Suture) ×2
SUTURE ETHIBOND EXCEL GREEN 2-0 SH L30 IN BRAID NONABSORBABLE (Suture) ×2 IMPLANT
SUTURE LAPRA TY ABSORB CLIP (Suture) ×1
SUTURE MONOCRYL 4-0 PS-2 L18 IN (Suture) ×2
SUTURE MONOCRYL 4-0 PS-2 L18 IN MONOFILAMENT UNDYED ABSORBABLE (Suture) ×2 IMPLANT
SUTURE MONOCRYL 4-0 PS2 18 IN (Suture) ×2
SUTURE PASSER WECK EFX CLASSIC (Procedure Accessories) ×1 IMPLANT
SUTURE UNIT ENDOSTITCH GREN 48 (Laparoscopy Supplies) ×3
SUTURE VICRYL 2-0 SH 27IN (Suture) ×4
SYRINGE 30 ML CONCENTRIC TIP GRADUATE (Syringes, Needles) ×2
SYRINGE 30 ML CONCENTRIC TIP GRADUATE NONPYROGENIC DEHP FREE LOK (Syringes, Needles) ×2 IMPLANT
SYRINGE LUER LOCK WO SFTY 30ML (Syringes, Needles) ×2
SYSTEM GASTRIC LAVAGE SELECTION VALVE FENESTRATION PATTERN 107CM 36FR (Procedure Accessories) ×1 IMPLANT
SYSTEM GASTRIC LAVAGE VALVE FENESTRATION 107CM 50FR SLEEVE BULB (Procedure Accessories) IMPLANT
SYSTEM GSTRC LAV THRMPLST ELSTMR VISIGI (Procedure Accessories) ×1
SYSTEM IMAGING 8X6IN CLEARIFY MICROFIBER WARM HUB TRCR WIPE DSPSBL (Kits) ×1 IMPLANT
SYSTEM IMG MRFBR CLEARIFY 8X6IN WRM HUB (Kits) ×1
TIP SCISSORS ENDOCUT L19.3 MM (Instrument) ×1 IMPLANT
TRAY GEN LAPAROSCOPY FFX (Pack) ×1
TROCAR BLADELESS ENDO 5X100MM (Laparoscopy Supplies) ×1
TROCAR ENDOPATH XCEL 15X100MM (Laparoscopy Supplies) ×1
TROCAR LAPAROSCOPIC L100 MM OD15 MM STABILITY SLEEVE BLADELESS (Laparoscopy Supplies) ×1 IMPLANT
TROCAR LAPAROSCOPIC STABILITY SLEEVE (Laparoscopy Supplies) ×1
TROCAR LAPSCP EPTH XCL 5MM 100MM LF STRL (Laparoscopy Supplies) ×1
TROCAR OD5 MM L100 MM BLADELESS STABLE SLEEVE ENDOPATH XCEL ENDOSCOPIC (Laparoscopy Supplies) ×1 IMPLANT
TUBE VISIGI 3D SLEEVE 36F (Procedure Accessories) ×1
TUBING PNEUMOCLR SMOKE EVAC HF (Tubing) ×1
TUNNELER ONQ SHTH 17GX8IN (Procedure Accessories) ×1
TUNNELER SURGICAL L8 IN SHEATH OD17 GA (Procedure Accessories) ×1
TUNNELER SURGICAL L8 IN SHEATH OD17 GA ON-Q* (Procedure Accessories) ×1 IMPLANT
UNDRGLOV SZ 7 PI INDICATR BLUE (Glove) ×1
UNDRGLOV SZ7.5 PI INDICATR BLU (Glove) ×1

## 2020-07-28 NOTE — Plan of Care (Signed)
Pt is drowsy at this time. Report was received from [place], [place] on pt. No reports of N/V. Pt is negative for flatus. Pain is a 5/10 at this time in abdomen and PRN pain medication was given per orders. 5 lap sites noted on abdomen that are C/D/I and are OTA. Cold compress is noted on abdomen. Active bowel sounds noted in all four quadrants of abdomen and abdomen is rounded/soft to the touch. Pt is DTV. Skin assessment was completed with Misty Stanley, RN - old scar noted on RLE - skin is C/D/I. Pt is NPO except for ice chips at this time per orders. Pt is resting in bed at this time.     Pt will be free of injury during hospital visit.  Pt belongings, phone, call bell within reach.  Pt instructed to call for assistance when needed, demonstrates understanding.  Pt bed in the lowest position, bed alarm on.          Problem: Safety  Goal: Patient will be free from injury during hospitalization  Outcome: Progressing  Flowsheets (Taken 07/28/2020 1241)  Patient will be free from injury during hospitalization:   Assess patient's risk for falls and implement fall prevention plan of care per policy   Provide and maintain safe environment  Goal: Patient will be free from infection during hospitalization  Outcome: Progressing     Problem: Pain  Goal: Pain at adequate level as identified by patient  Outcome: Progressing  Flowsheets (Taken 07/28/2020 1241)  Pain at adequate level as identified by patient:   Identify patient comfort function goal   Assess for risk of opioid induced respiratory depression, including snoring/sleep apnea. Alert healthcare team of risk factors identified.   Assess pain on admission, during daily assessment and/or before any "as needed" intervention(s)   Reassess pain within 30-60 minutes of any procedure/intervention, per Pain Assessment, Intervention, Reassessment (AIR) Cycle     Problem: Side Effects from Pain Analgesia  Goal: Patient will experience minimal side effects of analgesic therapy  Outcome:  Progressing  Flowsheets (Taken 07/28/2020 1241)  Patient will experience minimal side effects of analgesic therapy:   Monitor/assess patient's respiratory status (RR depth, effort, breath sounds)   Assess for changes in cognitive function     Problem: Discharge Barriers  Goal: Patient will be discharged home or other facility with appropriate resources  Outcome: Progressing     Problem: Psychosocial and Spiritual Needs  Goal: Demonstrates ability to cope with hospitalization/illness  Outcome: Progressing     Problem: Moderate/High Fall Risk Score >5  Goal: Patient will remain free of falls  Outcome: Progressing     Problem: Day of Surgery- Sleeve Gastrectomy Surgery  Goal: Stable vital signs and fluid balance  Outcome: Progressing  Flowsheets (Taken 07/28/2020 1241)  Stable vital signs and fluid balance:   Monitor vital signs   Monitor/assess O2 saturation  Goal: Pain at adequate level  Outcome: Progressing  Goal: Effective ventilation maintained  Outcome: Progressing  Goal: Tissue perfusion is adequate  Outcome: Progressing  Flowsheets (Taken 07/28/2020 1241)  Tissue perfusion is adequate: Assess and monitor skin color and temperature  Goal: Nutritional intake is adequate  Outcome: Progressing  Goal: Mobility/activity is maintained at optimum level  Outcome: Progressing  Flowsheets (Taken 07/28/2020 1241)  Mobility/activity is maintained at optimum level:   Out of bed with assistance as needed   Ambulate at least once per shift with assistance  Goal: Patient/patient care companion demonstrates understanding of surgery/treatment plan, medications, and discharge plans  Outcome:  Progressing     Problem: Post Op Day 1- Sleeve Gastrectomy Surgery  Goal: Stable vital signs and fluid balance  Outcome: Progressing  Goal: Pain at adequate level  Outcome: Progressing  Goal: Effective ventilation maintained  Outcome: Progressing  Goal: Tissue perfusion is adequate  Outcome: Progressing  Goal: Nutritional Intake is  adequate  Outcome: Progressing  Goal: Mobility/activity is maintained at optimum level  Outcome: Progressing  Goal: Patient/patient care companion demonstrates understanding of surgery/treatment plan, medications, and discharge plans  Outcome: Progressing

## 2020-07-28 NOTE — Discharge Instr - AVS First Page (Signed)
Drs. Moazzez, Nain, and Pourshojae   [address], [address]   [place], [place] [address]   (O) [phone]   (F) [phone]    [address], [address]   [place], [place] [address]   (O) [phone]   (F) [phone]       PATIENT DISCHARGE INFORMATION: BARIATRIC SURGERY    We are concerned about your health and have developed a few guidelines to help you during your hospitalization and at home. Please call your physician if you have any questions about your care when you get home.    DIET    Only sugar free, caffeine free, non carbonated liquids are allowed. Bariatric clear liquids for 2 weeks plus protein shakes, and then bariatric full liquids for the following week until advanced by your physician or dietician.  Do not eat solids. Small sips.    Increase your liquid intake from a minimum of 6 to 8 cups a day as tolerated. Sip on water throughout the day. Ensure to get a minimum of 50 grams (for women) and 63 grams (men) of protein per day from your supplement.     The 30/30 rule (not drinking liquids for 30 minutes before meals or 30 minutes after meals) will apply after you are finished with your liquid diet.    Avoid foods high in sugar or fat. Maintain an adequate protein intake. Avoid nuts, popcorn, some citrus fruits, and soft breads for 2 months. Take chewable multivitamin supplement daily    If vomiting occurs, and persists more than 24 hours, please notify your doctor.    ACTIVITY    Avoid heavy lifting (more than 10 pounds) or strenuous exercise for at least 4 weeks.    Walk daily, gradually increasing speed with a goal of 2 miles.  Ok to go up and down stairs.    MEDICATION    Follow the medication instructions as written by your doctor. Break all medications in half or crush unless advised differently from your doctor.    You should refrain from drinking alcohol, you should refrain from driving if you are taking a narcotic medication for pain.    If the pain is unrelieved by the  prescribed medication, please call you doctor.    Take all medications as prescribed.  - Take the Tylenol (650 mg or 20 mL) every four hours for pain control for the next 3-5 days and then as needed.  Suggested times are 7:00am, 11:00am, 3:00pm, and 7:00pm.  You can add another dose at 11:00pm if needed before bedtime . Do not exceed 4 grams (4000 mg) of tylenol total in 24 hours. The current dosing will give you 2600 mg- 3250 mg which is below the daily maximum.     - Take the gabapentin three times a day for the next 3-5 days and then as needed for pain.  This is for sharper pain.    - Take the oxycodone (or Dilaudid, whichever you were prescribed) as needed for severe pain.  This is a narcotic and has additive properties if used and used for a prolonged period of time.  You cannot operate heavy machinery (a vehicle) while on narcotics.  Contact your insurance company for official recommendations for when you can drive after discontinuing narcotic medications.     - Take Prevacid (lansoprazole) or Prilosec (omeprazole), whichever you were prescribed, every day.  This is for acid and prevention of ulcers.  It is important to take this even if you don't   feel like you're having acid reflux.    - Use Zofran (ondansetron) as needed for nausea.    - If you have received Lovenox (blood thinner), please use as instructed (2 -4 weeks)    - If you are prescribed URSODIOL (Actigall), start that medication 2 weeks after surgery.  This is for prevention of gallstones.  You can still develop gallstones but this therapy helps to limit this process with rapid weight loss.       INCISION    Keep your incision clean and dry. You may shower daily and pat your incision dry. No bathing or swimming for 4 weeks    Check you incision daily and notify your doctor if you notice any redness or drainage.    It is not necessary to take your temperature routinely, but if you feel like you have a fever and it is 101 degrees Farenheit or more,  please call your doctor.    OTHER CARE MEASURES    Continue to use incentive spirometer 10 times per hour while awake until your next doctor visit.  Make sure you are drinking enough and urinating at least 4-6 times per day.    Notify physician with:  -fever >101  -one sided calf pain  -vomiting  -increasing abdominal pain  -sudden onset shoulder pain    MAKE SURE YOU REMAIN HYDRATED.  You can keep water with you at all times and should sip to maintain hydration.     TENTATIVE SCHEDULE:    7:00am:  Morning water 6-8oz, Breakfast, Tylenol/Gabapentin  8:00am:  Daily Medications  9:00am:  Protein Shake/Lovenox  10:00am:  Vitamins  11:00am:  Water 6-8 oz, Tylenol  12:00pm:  Lunch  1:00pm:  Water 6-8 oz  2:00pm:  Water 6-8 oz  3:00pm:  Medications (Gabapentin/Tylenol)  4:00pm:  Water 6-8 oz  5:00pm:  Dinner  6:00pm:  Water 6-8 oz  7:00pm:  Multivitamin, Tylenol  9:00pm:  Water, Gabapentin, second Lovenox (if prescribed twice daily)  11:00pm: Tylenol if needed      CONGRATULATIONS on your commitment to your success.  Fall in love with your success and JOIN US ON FACEBOOK!    This online community is designed to be a site for support to all of our POST OPERATIVE bariatric patients.  This will connect you with our staff and our patients.  Follow the process and share your story.  YOU ARE NOT ALONE and we all need to be engaged to support and educate each other.  Welcome to the [facility].    [email or web address]

## 2020-07-28 NOTE — Anesthesia Postprocedure Evaluation (Signed)
Anesthesia Post Evaluation    Patient: Barbara Rubio    Procedure(s):  LAPAROSCOPIC GASTRECTOMY SLEEVE WITH HIATAL HERNIA, OMENTOPEXY, LAPAROSCOPIC RESECTION OF ANTRAL AND FUNDIC GASTRIC MASS    Anesthesia type: general    Last Vitals:   Vitals Value Taken Time   BP 152/77 07/28/20 1130   Temp 36.8 C (98.2 F) 07/28/20 1130   Pulse 74 07/28/20 1130   Resp 16 07/28/20 1130   SpO2 95 % 07/28/20 1130                 Anesthesia Post Evaluation:     Patient Evaluated: PACU  Patient Participation: complete - patient participated  Level of Consciousness: sleepy but conscious  Pain Score: 5  Pain Management: adequate    Airway Patency: patent    Anesthetic complications: No      PONV Status: none    Cardiovascular status: acceptable  Respiratory status: acceptable  Hydration status: acceptable        Signed by: Deneise Lever, MD, 07/28/2020 11:46 AM

## 2020-07-28 NOTE — Op Note (Signed)
Patient: Barbara Rubio, Barbara Rubio    Date: 07/28/20    Procedure date: 07/28/2020      Pre op diagnosis:   Morbid obesity  BMI of 37  GERD without esophagitis  Hypertension  Hyperlipidemia  Hiatal hernia  Vitamin D deficiency  Psoriatic arthritis    Post op diagnosis:   Morbid obesity  BMI of 37  GERD without esophagitis  Hypertension  Hyperlipidemia  Hiatal hernia  Vitamin D deficiency  Psoriatic arthritis  Antral and fundic gastric masses    Procedure: Procedure(s):  LAPAROSCOPIC GASTRECTOMY SLEEVE WITH HIATAL HERNIA, OMENTOPEXY, LAPAROSCOPIC RESECTION OF ANTRAL AND FUNDIC GASTRIC MASS                        Surgeon: Surgeon(s) and Role:     * Maely Clements R, DO - Primary     * Culbreath, Frederik Schmidt, MD - Fellow     who was essential and present throughout the procedure to help in   positioning, transfer, port placement, skin closure, retraction and   exposure during critical parts of the procedure, also running the camera   and scope.     No qualified surgical resident was available to assist in this case    Estimated blood loss: minimal    Complications:none    Anaesthesia: General Endotracheal Intubation    Findings:   Leak test is negative.  The sleeve was created over a 36-French VisiGi bougie.  A large hiatal hernia was present  3 cm exophytic fundic gastric mass  1 cm exophytic antral gastric mass    Specimen:   Portion of stomach with fundic gastric mass  Antral gastric mass    Drain:  no    Implant:   Implant Name Type Inv. Item Serial No. Manufacturer Lot No. LRB No. Used Action   [employer] [facility] BIOABSRB TRI60P - [id number] Sealant [employer] SEAMGR[employer] BIOABSRB TRI60P  Dewitt Hoes [id number] N/A 1 Implanted               INDICATIONS:  Barbara Rubio is a 69 y.o. morbidly obese female who has failed multiple previous attempts at conservative weight loss who opted to proceed with laparoscopic sleeve gastrectomy as a surgical weight loss option understanding all other options including gastric bypass and  malabsorptive procedures and gastric banding.  After an extensive preoperative education workup, psychiatric and dietary counseling, meeting all the preoperative clearance criteria and being cleared by medicine was cleared and scheduled for surgery. For a detailed explanation of risks please refer to the hospital and/or office chart.     DESCRIPTION OF PROCEDURE:  Barbara Rubio was admitted through the preoperative holding area, received preoperative antibiotics, subcutaneous Lovenox, and compression stockings were placed.  Patient was identified by myself in the preoperative area, where surgical consent was once again obtained and all further questions and concerns were addressed; was then taken to operating room and was placed in supine position.  General endotracheal anesthesia was instituted.  Footboard was placed and pressure points were padded.  The abdomen was prepped and draped using chlorhexidine.  Surgical pause was made and an incision was made in the supraumbilical region through which a 5-mm Optiview blunt-tipped trocar was placed under direct vision and laparoscope into the abdominal cavity using the Optiview technique.  The patient was then placed in reverse Trendelenburg position.  A 5-mm trocar was placed in left flank.  A 5-mm trocar was placed in between last 2 trocars.  The supraumbilical 5-mm  trocar was exchanged to a 15-mm blunted trocar and a 5-mm trocar was placed in right upper quadrant.  We proceeded to give a laparoscopic tap block with a mixture of 20 mL of Exparel, 30 mm of plain 0.5 Marcaine, and 10 mL of normal saline on the bilateral subcostal and flank areas. A Nathanson liver retractor was placed to retract left lobe of liver anteriorly.      A hiatal hernia was identified.  Patient had a exophytic proximal antral mass at the superior border.  This was resected using a 60 mm Endo GIA with reinforcement.  The mass was approximately 1 cm.  The greater curvature of the stomach  was marked about 6 cm proximal to the pylorus.  The angle of His was exposed bluntly. The anterior fat pad of the stomach was removed using a LigaSure device.   After the fat pad was removed and the angle of his had been dissected the hernia sac was then incised anteriorly to expose the right and left crus.  The gastrohepatic ligament was taken down using the vessel sealer.  This exposed the right column of the crus.  The hernia sac was dissected off the right crus by using the vessel sealer.  The junction of the right and left crus posterior to the esophagus was identified dissected.  A window was made posterior to the esophagus for retraction.  The hernia sac was then circumferentially excised using the vessel sealer.  The esophagus was then mobilized 3 cm below the diaphragm by taking down the aortic, pericardium, pleural mediastinal attachments to the esophagus.  This was done using the LigaSure and blunt dissection.  The hernia defect was then repaired by approximating the right and left crus using a 0 Ethibond suture in a figure-of-eight fashion.  A 0 Ethibond suture was also placed anterior to the esophagus to approximate the right and left crus.  This was done over a 36 French bougie.    The greater curvature of the stomach was then mobilized starting at about 6 cm proximal to the pylorus to divide the gastrocolic ligament using the LigaSure device.  The dissection then continued proximally to divide the rest of the gastrocolic ligament, followed by gastrosplenic ligament, followed by the short gastric vessels to completely mobilize the fundus of the stomach and expose the angle of His and also the base of the left crus of the diaphragm.  A 3 cm exophytic mass was seen at the lateral aspect of the proximal fundus.  This was also mobilized with the stomach.  There was enough space between the exophytic mass and the bougie to safely resect this with the specimen of the sleeve gastrectomy.  After completely  mobilizing the stomach and dividing all the adhesions in the lesser sac a 36-French VisiGi blunt-tip bougie was inserted into the stomach by the anesthesiologist.  The tip of the 36-French bougie was placed in the prepyloric region.  The stomach was then divided starting at about 4 cm proximal to pylorus, pointing toward and lateral to the angularis.  The stapler was fired wide at the angularis to minimize risk of stricture formation at that site.  The black endoGIA stapler was used over linear enforcement.  This was followed by a black load linear stapler over the linear reinforcement to get around the angularis and point towards the angle of His.  2 more purple load linear staplers over linear reinforcements were used to divide the stomach, marching toward the angle of His, followed  by 1 purple loads to completely construct the sleeve.  The last or most proximal stapler was fired just 1 cm or so lateral to the gastroesophageal junction.  All of the staple were fired over linear reinforcements.  The staple lines was reinforced using an omental patch pexy with a 2-0 Vicryl suture starting at the Angle of His.  This was also done to minimize risk of slippage of the sleeve at the angularis.  The 36-French VisiGi bougie was used to do the leak test.  The distal stomach was clamped as the sleeve was submerged under saline solution.  The sleeve was then inflated with air through the VisiGi bougie to up to 50 mmHg without any leaks noted.  Hemostasis was ensured.  The left upper quadrant was irrigated with saline solution.   The irrigant was aspirated.  The resected stomach was retrieved through thesupraumbilical trocar site in a 15 mm Endo Catch bag which included the fundic mass.  The antral mass had easily been taken out through the 15 mm port site.  It was passed off the table for permanent  pathology.  The fascial opening at the supraumbilical trocar site was approximated using 2 figure-of-eight #0 Vicryl suture  using the Carter-Thomason fascial closure device.  The trocars were all removed under direct vision.  No bleeding was noted from any of the trocar sites.  Skin incisions were approximated #4-0 Monocryl suture.  Dermabond was placed over the incision sites.  The patient was then extubated  and taken to the post anesthesia care unit without any complications,  tolerating the procedure well.      Signed by: Willow Ora Jasiah Buntin, DO, FACS, FASMBS, FACOS    [facility]

## 2020-07-28 NOTE — Transfer of Care (Signed)
Anesthesia Transfer of Care Note    Patient: Barbara Rubio    Procedures performed: Procedure(s):  LAPAROSCOPIC GASTRECTOMY SLEEVE WITH HIATAL HERNIA, OMENTOPEXY, LAPAROSCOPIC RESECTION OF ANTRAL AND FUNDIC GASTRIC MASS    Anesthesia type: General ETT    Patient location:Phase I PACU    Post pain: Patient not complaining of pain, continue current therapy                 Mental Status:awake    Respiratory Function: tolerating face mask    Cardiovascular: stable    Nausea/Vomiting: patient not complaining of nausea or vomiting    Hydration Status: adequate    Post assessment: no apparent anesthetic complications    Signed by: Ernst Bowler, CRNA  07/28/20 10:13 AM

## 2020-07-28 NOTE — Plan of Care (Signed)
Problem: Safety  Goal: Patient will be free from injury during hospitalization  Outcome: Progressing  Flowsheets (Taken 07/28/2020 2204)  Patient will be free from injury during hospitalization:   Assess patient's risk for falls and implement fall prevention plan of care per policy   Provide and maintain safe environment   Use appropriate transfer methods   Ensure appropriate safety devices are available at the bedside   Include patient/ family/ care giver in decisions related to safety   Hourly rounding     Problem: Pain  Goal: Pain at adequate level as identified by patient  Outcome: Progressing  Flowsheets (Taken 07/28/2020 2204)  Pain at adequate level as identified by patient:   Identify patient comfort function goal   Assess for risk of opioid induced respiratory depression, including snoring/sleep apnea. Alert healthcare team of risk factors identified.   Assess pain on admission, during daily assessment and/or before any "as needed" intervention(s)   Reassess pain within 30-60 minutes of any procedure/intervention, per Pain Assessment, Intervention, Reassessment (AIR) Cycle   Evaluate if patient comfort function goal is met   Evaluate patient's satisfaction with pain management progress     Problem: Day of Surgery- Sleeve Gastrectomy Surgery  Goal: Stable vital signs and fluid balance  Outcome: Progressing  Flowsheets (Taken 07/28/2020 2204)  Stable vital signs and fluid balance:   Monitor vital signs   Monitor/assess O2 saturation   Monitor lab values. Notify LIP of abnormal results.   Monitor intake and output. Notify LIP if urine output is less than 240 mL in 8 hours  Goal: Pain at adequate level  Outcome: Progressing  Flowsheets (Taken 07/28/2020 2204)  Pain at adequate level as identified by patient:   Identify patient comfort function goal   Evaluate if patient comfort function goal is met  Goal: Nutritional intake is adequate  Outcome: Progressing  Flowsheets (Taken 07/28/2020 2204)  Nutritional intake  is adequate:   Monitor intake and output   Assess GI status (bowel sounds, nausea/vomiting, distention, flatus)   Administer acid-reducer meds as prescribed   Bariatric dietician consult as indicated for next day   Provide sips of water. Do not use straws.  Goal: Mobility/activity is maintained at optimum level  Outcome: Progressing  Flowsheets (Taken 07/28/2020 2204)  Mobility/activity is maintained at optimum level:   Out of bed with assistance as needed   Out of bed to chair if indicated with assistance as needed   Ambulate at least once per shift with assistance

## 2020-07-28 NOTE — Interval H&P Note (Signed)
I have examined the patient and reviewed the H&P. No pertinent change in the patient's condition since the H&P was completed.          Discussed with the patient the plan for operative intervention and post-operative expectations for timing of discharge, timing for resuming a diet and any limitations to the diet, activity expectations/ limitations, pain expectations and management strategies for making the pain manageable.     Miguel Medal R. Adley Castello, DO, FACS, FASMBS, FACOS  7:31 AM 07/28/2020

## 2020-07-29 ENCOUNTER — Encounter: Payer: Self-pay | Admitting: Surgery

## 2020-07-29 DIAGNOSIS — Z9884 Bariatric surgery status: Secondary | ICD-10-CM

## 2020-07-29 LAB — CBC
Absolute NRBC: 0 10*3/uL (ref 0.00–0.00)
Hematocrit: 41.5 % (ref 34.7–43.7)
Hgb: 13.4 g/dL (ref 11.4–14.8)
MCH: 27.7 pg (ref 25.1–33.5)
MCHC: 32.3 g/dL (ref 31.5–35.8)
MCV: 85.7 fL (ref 78.0–96.0)
MPV: 13.1 fL — ABNORMAL HIGH (ref 8.9–12.5)
Nucleated RBC: 0 /100 WBC (ref 0.0–0.0)
Platelets: 139 10*3/uL — ABNORMAL LOW (ref 142–346)
RBC: 4.84 10*6/uL (ref 3.90–5.10)
RDW: 14 % (ref 11–15)
WBC: 8.87 10*3/uL (ref 3.10–9.50)

## 2020-07-29 LAB — BASIC METABOLIC PANEL
Anion Gap: 13 (ref 5.0–15.0)
BUN: 7 mg/dL (ref 7–19)
CO2: 20 mEq/L — ABNORMAL LOW (ref 22–29)
Calcium: 9.1 mg/dL (ref 8.5–10.5)
Chloride: 102 mEq/L (ref 100–111)
Creatinine: 0.8 mg/dL (ref 0.6–1.0)
Glucose: 94 mg/dL (ref 70–100)
Potassium: 4.6 mEq/L (ref 3.5–5.1)
Sodium: 135 mEq/L — ABNORMAL LOW (ref 136–145)

## 2020-07-29 LAB — GFR: EGFR: >60

## 2020-07-29 LAB — PHOSPHORUS: Phosphorus: 2.3 mg/dL (ref 2.3–4.7)

## 2020-07-29 LAB — CK: Creatine Kinase (CK): 351 U/L — ABNORMAL HIGH (ref 29–168)

## 2020-07-29 LAB — MAGNESIUM: Magnesium: 1.8 mg/dL (ref 1.6–2.6)

## 2020-07-29 MED ORDER — ENOXAPARIN SODIUM 40 MG/0.4ML SC SOLN
40.0000 mg | SUBCUTANEOUS | Status: DC
Start: 2020-07-30 — End: 2020-07-29

## 2020-07-29 MED ORDER — ACETAMINOPHEN 160 MG/5ML PO SOLN
650.0000 mg | ORAL | 1 refills | Status: DC
Start: 2020-07-29 — End: 2020-08-10

## 2020-07-29 MED ORDER — URSODIOL 300 MG PO CAPS
300.0000 mg | ORAL_CAPSULE | Freq: Two times a day (BID) | ORAL | 1 refills | Status: DC
Start: 2020-08-11 — End: 2021-01-07

## 2020-07-29 MED ORDER — GABAPENTIN 300 MG PO CAPS
300.0000 mg | ORAL_CAPSULE | Freq: Three times a day (TID) | ORAL | 0 refills | Status: DC
Start: 2020-07-29 — End: 2020-08-03

## 2020-07-29 MED ORDER — ENOXAPARIN SODIUM 40 MG/0.4ML SC SOLN
40.0000 mg | Freq: Every day | SUBCUTANEOUS | 0 refills | Status: AC
Start: 2020-07-29 — End: 2020-08-12

## 2020-07-29 MED ORDER — OXYCODONE HCL 5 MG/5ML PO SOLN
5.0000 mg | ORAL | 0 refills | Status: AC | PRN
Start: 2020-07-29 — End: 2020-08-05

## 2020-07-29 MED ORDER — LANSOPRAZOLE 30 MG PO CPDR
30.0000 mg | DELAYED_RELEASE_CAPSULE | Freq: Every day | ORAL | 1 refills | Status: DC
Start: 2020-07-29 — End: 2021-01-07

## 2020-07-29 NOTE — Progress Notes (Signed)
Pt is AAOX4. Pain is controlled with scheduled tylenol. Denies N/V. Pt ambulated in the hallway. Instructed to do I.S. when awake. Denies passing gas at this time. Voiding adequately. Pt BP is elevated. Asymptomatic. With PRN hydralazine if systolic BP is more than 180. Diet to be advance to bariatic clears at 5am.  We will continue to monitor.

## 2020-07-29 NOTE — Progress Notes (Addendum)
Case Management Initial Assessment and Discharge Planning:    Situation Barbara Rubio, Barbara Rubio y.o.,female  HospitalDay:1  Admitting DX: Morbid obesity [E66.01]  Severe obesity (BMI 35.0-35.9 with comorbidity) [E66.01, Z68.35]    Lace Score: 1   Background   DME:  na  Home Health: na  Transportation: car  Out Patient: na     Assessment Care Manager role and services introduced to patient and daughter at the bedside. Demographics verified, and her daughter will provide support at discharge.    Discharge Barriers identified upon initial assessment: na   Recommendation   Possible post-hospitalization need based on patient's GOC and treatment plan:      Home with daughter, has all needed supplies at home. Her daughter will stay with her. She has no identified needs at this time                CM will continue to monitor patient's progression of care, d/c needs and possible barriers to discharge.     Max Fickle RN BSN CMSRN   Case Manager ext [phone]       07/29/20 1252   Patient Type   Within 30 Days of Previous Admission? No   Healthcare Decisions   Interviewed: Patient;Family   Orientation/Decision Making Abilities of Patient Alert and Oriented x3, able to make decisions   Prior to admission   Prior level of function Independent with ADLs   Type of Residence Private residence   Have running water, electricity, heat, etc? Yes   Living Arrangements Children;Family members   How do you get to your MD appointments? daughter   How do you get your groceries? daughter   Who fixes your meals? daughter   Who does your laundry? daughter   Who picks up your prescriptions? daughter   Dressing Independent   Grooming Independent   Feeding Independent   Bathing Independent   Toileting Independent   Name of Prior Assisted Living Facility na   Prior SNF admission? (Detail) na   Discharge Planning   Support Systems Children   Anticipated [place] plan discussed with: Same as interviewed   Mode of transportation: Private car (family  member)   Does the patient have perscription coverage? Yes   Consults/Providers   Outcome Palliative Care Screen Screened but did not meet criteria for intervention   Important Message from Medicare Notice   Patient received 1st IMM Letter? Yes   Date of most recent IMM given: 07/28/20

## 2020-07-29 NOTE — Progress Notes (Signed)
Asked about plan for exercise after cleared and released YES    Handed out information on post op physical activity YES    Talked about increasing number of steps YES    Gave hand out on increasing steps YES    Gave info on Post Op class and Personal Training services YES    Additional comments: N/A

## 2020-07-29 NOTE — Progress Notes (Signed)
[facility]    Progress Note          POD# 1 ABX: None         ASSESSMENT:  Barbara Rubio is a 69 y.o. 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    PLAN:  Patient is doing well.  Patient's surgical findings were discussed with the patient and the patient's daughter.  Clear liquids  Ambulate  Incentive spirometry  DVT prophylaxis  Anticipate discharge later today when parameters have been met.      SUBJECTIVE:    Overnight events: Voiding without any difficulty.  Nausea is controlled.        OBJECTIVE:    Physical Exam:  Temp (24hrs), Avg:97.9 F (36.6 C), Min:97.5 F (36.4 C), Max:98.2 F (36.8 C)   BP 158/84    Pulse 75    Temp 97.7 F (36.5 C) (Oral)    Resp 16    Ht 5\' 7"     Wt 235 lb 6.4 oz    SpO2 100%    BMI 36.87 kg/m     Intake/Output Summary (Last 24 hours) at 07/29/2020 0710  Last data filed at 07/29/2020 0600  Gross per 24 hour   Intake 3843.67 ml   Output 2550 ml   Net 1293.67 ml         PHYSICAL EXAM  General:  Patient appeared in no distress  Vitals:  Vital signs reviewed, see nurses notes  Neck:  No JVD, or lymphadenopathy.  Abdomen:  Soft, non distended, bowel sounds active, incision(s) are clean, dry, and intact.  Extremities:  Non-edematous and Normal distal pulses.  Neuro:  Nonfocal and Normal sensation.        Data Review    Results     Procedure Component Value Units Date/Time    Phosphorus [[id number]] Collected: 07/29/20 0424    Specimen: Blood Updated: 07/29/20 0454     Phosphorus 2.3 mg/dL     Narrative:      If indicated    Creatine Kinase (CK) [[id number]]  (Abnormal) Collected: 07/29/20 0424    Specimen: Blood Updated: 07/29/20 0454     Creatine Kinase (CK) 351 U/L     Narrative:      If indicated    GFR [[id number]] Collected: 07/29/20 0424     Updated: 07/29/20 0454     EGFR >60.0    Narrative:      If indicated    Basic Metabolic Panel [[id number]]  (Abnormal)  Collected: 07/29/20 0424    Specimen: Blood Updated: 07/29/20 0454     Glucose 94 mg/dL      BUN 7 mg/dL      Creatinine 0.8 mg/dL      Calcium 9.1 mg/dL      Sodium 244 mEq/L      Potassium 4.6 mEq/L      Chloride 102 mEq/L      CO2 20 mEq/L      Anion Gap 13.0    Narrative:      If indicated    Magnesium [[id number]] Collected: 07/29/20 0424    Specimen: Blood Updated: 07/29/20 0454     Magnesium 1.8 mg/dL     Narrative:      If indicated    CBC without differential [[id number]]  (Abnormal) Collected: 07/29/20 0424    Specimen: Blood Updated: 07/29/20 0443     WBC 8.87 x10 3/uL  Hgb 13.4 g/dL      Hematocrit 16.1 %      Platelets 139 x10 3/uL      RBC 4.84 x10 6/uL      MCV 85.7 fL      MCH 27.7 pg      MCHC 32.3 g/dL      RDW 14 %      MPV 13.1 fL      Nucleated RBC 0.0 /100 WBC      Absolute NRBC 0.00 x10 3/uL     Narrative:      If indicated          No results found.    Gara Kincade R. Meta Hatchet, [facility]  [address], [address]  [place], [place]  [address]    [address]  [place], [place]  [address]    [phone]  [email or web address]  Electronically Signed on 07/29/2020, 7:10 AM

## 2020-07-29 NOTE — UM Notes (Signed)
NAME:  Barbara Rubio, Barbara Rubio  DOB:  10-25-1950     PMH:    Past Medical History:   Diagnosis Date    Gastroesophageal reflux disease 10/23/2013    Acid reflux alot after meals. TUMS, PRILOSEC     Hyperlipidemia     Hypertension     well controlled    Left wrist fracture 2018    Psoriatic arthritis     LOWER BACK, LEFT HAND AND FINGERS     Urinary incontinence 12/21/2017    Over active bladder. TOVIAZ HELPS       ELECTIVE PROCEDURE DONE 07/28/2020:  Procedure(s):  LAPAROSCOPIC GASTRECTOMY SLEEVE WITH HIATAL HERNIA, OMENTOPEXY, LAPAROSCOPIC RESECTION OF ANTRAL AND FUNDIC GASTRIC MASS    CPT Code(s)  16109   is/are  on the Medicare IP Only list.       ADMITTED TO SURGICAL UNIT POST-OPERATIVELY.    07/28/20 1154  ADMIT TO INPATIENT Once    Diagnosis: Severe Obesity (Bmi 35.0-35.9 With Comorbidity)    Level of Care: Acute    Patient Class: Inpatient       References: IAH Bed Placement CriteriaIFMC Bed Placement CriteriaIFOH Bed Placement CriteriaILH Bed Placement CriteriaIMVH Bed Placement Criteria   Question Answer Comment   Admitting Physician POURSHOJAE, HAMID R    Service: Surgery    Estimated Length of Stay > or = to 2 midnights    Tentative Discharge Plan? Home or Self Care    Does patient need telemetry? No                Current Facility-Administered Medications   Medication Dose Route Frequency    acetaminophen  650 mg Oral Q4H    atorvastatin  10 mg Oral QHS    enoxaparin  40 mg Subcutaneous BID    famotidine  20 mg Intravenous Q12H [facility]    gabapentin  300 mg Oral Q8H    lisinopril  5 mg Oral Daily      0.9 % NaCl with KCl 20 mEq 130 mL/hr at 07/29/20 0758    lactated ringers Stopped (07/28/20 1226)    lactated ringers            Day 2:  July 29, 2020    Vitals:    07/29/20 0751   BP: 153/90   Pulse: 76   Resp:    Temp: 97.9 F (36.6 C)   SpO2: 100%         LABS:  Results     Procedure Component Value Units Date/Time    Phosphorus [[id number]] Collected: 07/29/20 0424     Specimen: Blood Updated: 07/29/20 0454     Phosphorus 2.3 mg/dL     Narrative:      If indicated    Creatine Kinase (CK) [[id number]]  (Abnormal) Collected: 07/29/20 0424    Specimen: Blood Updated: 07/29/20 0454     Creatine Kinase (CK) 351 U/L     Narrative:      If indicated    GFR [[id number]] Collected: 07/29/20 0424     Updated: 07/29/20 0454     EGFR >60.0    Narrative:      If indicated    Basic Metabolic Panel [[id number]]  (Abnormal) Collected: 07/29/20 0424    Specimen: Blood Updated: 07/29/20 0454     Glucose 94 mg/dL      BUN 7 mg/dL      Creatinine 0.8 mg/dL      Calcium 9.1 mg/dL      Sodium 841 mEq/L  Potassium 4.6 mEq/L      Chloride 102 mEq/L      CO2 20 mEq/L      Anion Gap 13.0    Narrative:      If indicated    Magnesium [[id number]] Collected: 07/29/20 0424    Specimen: Blood Updated: 07/29/20 0454     Magnesium 1.8 mg/dL     Narrative:      If indicated    CBC without differential [[id number]]  (Abnormal) Collected: 07/29/20 0424    Specimen: Blood Updated: 07/29/20 0443     WBC 8.87 x10 3/uL      Hgb 13.4 g/dL      Hematocrit 78.2 %      Platelets 139 x10 3/uL      RBC 4.84 x10 6/uL      MCV 85.7 fL      MCH 27.7 pg      MCHC 32.3 g/dL      RDW 14 %      MPV 13.1 fL      Nucleated RBC 0.0 /100 WBC      Absolute NRBC 0.00 x10 3/uL     Narrative:      If indicated            Current Facility-Administered Medications   Medication Dose Route Frequency    acetaminophen  650 mg Oral Q4H    atorvastatin  10 mg Oral QHS    enoxaparin  40 mg Subcutaneous BID    famotidine  20 mg Intravenous Q12H [facility]    gabapentin  300 mg Oral Q8H    lisinopril  5 mg Oral Daily      0.9 % NaCl with KCl 20 mEq 130 mL/hr at 07/29/20 0758    lactated ringers Stopped (07/28/20 1226)    lactated ringers              _____________________________________________      ** This clinical review is compiled from documentation provided by the treatment team in the medical record. **     _____________________________________________    Jenell Milliner, RN, BSN  Utilization Review Case Manager II  [facility]  [address]  [place], [place] [address]  Phone:  [phone]  Fax:  [phone]  Case Management Main Phone:  [phone]    [employer] [facility] Tax ID:  [id number]  NPI:  [id number]    Please use fax number [phone] to provide authorization for hospital services or to request additional information.  ------------------------------------------------------------------------

## 2020-07-29 NOTE — Discharge Summary (Signed)
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    Severe obesity (BMI 35.0-35.9 with comorbidity) 07/28/2020    Vitamin D deficiency 07/20/2020    Hiatal hernia 06/08/2020    Gastroesophageal reflux disease 03/16/2020    Severe obesity (BMI 35.0-39.9) with comorbidity 02/27/2020    Psoriatic arthritis 01/27/2020    Pain in both hands 04/05/2018    Pain in both feet 04/05/2018    Arthralgia, unspecified joint 04/05/2018    Chronic pain of both ankles 04/05/2018    Essential hypertension     Mixed hyperlipidemia      Gastric tumors    Indication for Admission/HPI:  Morbid obesity  Gastric tumors    Hospital Course:   Patient was admitted on the day of surgery.  Post operatively patient was transferred form the PACU to the surgical floor.  Patient had an uneventful post operative period and was discharged home on post operative day 1.    Significant Diagnostic Studies: labs: refer to chart    Treatments: IV hydration and procedures: Laparoscopic sleeve gastrectomy with hiatal hernia repair.  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    Discharge Exam:  BP 153/90    Pulse 76    Temp 97.9 F (36.6 C) (Oral)    Resp 16    Ht 5\' 7"     Wt 235 lb 6.4 oz    SpO2 100%    BMI 36.87 kg/m   General appearance: alert, appears stated age and cooperative  Head: Normocephalic, without obvious abnormality, atraumatic  Eyes: negative findings: conjunctivae and sclerae normal  Lungs: clear to auscultation bilaterally  Heart: regular rate and rhythm, S1, S2 normal, no murmur, click, rub or gallop  Abdomen: soft, non-tender;  bowel sounds normal; no masses,  no organomegaly and Incisions are clean, dry, intact  Extremities: extremities normal, atraumatic, no cyanosis or edema, Homans sign is negative, no sign of DVT and no edema, redness or tenderness in the calves or thighs    Disposition: Home or Self Care    Discharge Medications:  Current Discharge Medication List        START taking these medications    Details   acetaminophen (TYLENOL) 160 MG/5ML solution Take 20.3 mLs (650 mg total) by mouth every 4 (four) hours  Qty: 118 mL, Refills: 1      enoxaparin (LOVENOX) 40 MG/0.4ML Solution Inject 0.4 mLs (40 mg total) into the skin daily for 14 days  Qty: 5.6 mL, Refills: 0      gabapentin (NEURONTIN) 300 MG capsule Take 1 capsule (300 mg total) by mouth every 8 (eight) hours for 5 days  Qty: 15 capsule, Refills: 0      lansoprazole (PREVACID) 30 MG capsule Take 1 capsule (30 mg total) by mouth daily  Qty: 90 capsule, Refills: 1      oxyCODONE (ROXICODONE) 5 MG/5ML solution Take 5 mLs (5 mg total) by mouth every 4 (four) hours as needed for Pain  Qty: 100 mL, Refills: 0      ursodiol (ACTIGALL)  300 MG capsule Take 1 capsule (300 mg total) by mouth 2 (two) times daily Start 2 weeks after surgery  Qty: 180 capsule, Refills: 1           CONTINUE these medications which have NOT CHANGED    Details   atorvastatin (LIPITOR) 10 MG tablet TAKE 1 TABLET BY MOUTH EVERY DAY AT NIGHT  Qty: 90 tablet, Refills: 1    Associated Diagnoses: Mixed hyperlipidemia      Fesoterodine Fumarate (Toviaz) 4 MG Tablet SR 24 hr 2 (two) times daily         lisinopril (ZESTRIL) 5 MG tablet Take 1 tablet (5 mg total) by mouth daily  Qty: 90 tablet, Refills: 3    Associated Diagnoses: Essential hypertension      estradiol (ESTRACE) 0.1 MG/GM vaginal cream TWICE A WEEK               Patient Instructions:   Activity: no lifting, Driving, or Strenuous exercise for 4  Diet:  Clear bariatric liquids  Wound Care: none needed    Follow-up with Dr. Jeanell Sparrow in 2  week.    Wendy Hoback R. Jasiel Apachito, DO, FACS, FASMBS, FACOS

## 2020-07-29 NOTE — Plan of Care (Signed)
Problem: Post Op Day 1- Sleeve Gastrectomy Surgery  Goal: Stable vital signs and fluid balance  Flowsheets (Taken 07/29/2020 1000)  Patient has stable vital signs and fluid balance:   Discontinue telemetry monitor as ordered if not previously done   Monitor/assess O2 saturation   Monitor vital signs   Monitor intake and output. Notify LIP if urine output is less than 240 mL in 8 hours.   Monitor lab values. Notify LIP of abnormal results.     Problem: Post Op Day 1- Sleeve Gastrectomy Surgery  Goal: Pain at adequate level  Flowsheets (Taken 07/29/2020 1000)  Pain at adequate level as identified by patient:   Identify and evaluate patient comfort function goal   Crush oral pain medication per physician order/preference   Administer Toradol if prescribed

## 2020-07-29 NOTE — Consults (Signed)
**Note De-Identified  Obfuscation** Inpatient Bariatric Nutrition Education       Surgery Type: Sleeve  Surgery Date: 07/28/20    Nutrition education done by surgeon's RD pre-operatively: Yes    Reinforced nutritional guidelines: Yes    Instructed patient on adequate fluid intake with goal of drinking 64 ounces/day: Yes    Patient verbalized understanding of diet progression: Yes    Patient has home supply of protein supplements: Yes    Patient has home supply of vitamins and minerals: Yes, but still needs calcium.     Going Home Nutrition information reviewed with patient: Yes    Educational materials/handouts given to patient: Yes    Patient verbalized comprehension of bariatric nutrition guidelines to follow upon discharge: Yes    Notes: questions answered.

## 2020-07-29 NOTE — Progress Notes (Signed)
Pt interviewed and denies any anesthetic complications   except for nausea controlled by antiemetics.  + voiding.

## 2020-07-30 ENCOUNTER — Encounter (INDEPENDENT_AMBULATORY_CARE_PROVIDER_SITE_OTHER): Payer: Self-pay

## 2020-07-30 LAB — BETA HCG, TUMOR, QUANTITATIVE: TUMOR MARKER BHCG QUANT: 4

## 2020-07-31 ENCOUNTER — Encounter (INDEPENDENT_AMBULATORY_CARE_PROVIDER_SITE_OTHER): Payer: Self-pay

## 2020-08-02 LAB — LAB USE ONLY - HISTORICAL SURGICAL PATHOLOGY

## 2020-08-03 ENCOUNTER — Encounter (HOSPITAL_BASED_OUTPATIENT_CLINIC_OR_DEPARTMENT_OTHER): Payer: Self-pay

## 2020-08-04 ENCOUNTER — Telehealth (HOSPITAL_BASED_OUTPATIENT_CLINIC_OR_DEPARTMENT_OTHER): Payer: Self-pay

## 2020-08-04 ENCOUNTER — Telehealth: Payer: Self-pay

## 2020-08-04 NOTE — Telephone Encounter (Signed)
**Note De-identified  Obfuscation** Discharge Phone Call for Bariatric Surgery Patients     mychart message sent

## 2020-08-04 NOTE — Telephone Encounter (Signed)
Pt called stating that she hadn't had a bowel movement x 3 days.  Instructed her to take Miralax/Dulcolax, as well as use a suppository.

## 2020-08-10 ENCOUNTER — Ambulatory Visit (INDEPENDENT_AMBULATORY_CARE_PROVIDER_SITE_OTHER): Payer: Medicare Other | Admitting: Surgery

## 2020-08-10 ENCOUNTER — Encounter (HOSPITAL_BASED_OUTPATIENT_CLINIC_OR_DEPARTMENT_OTHER): Payer: Self-pay | Admitting: Surgery

## 2020-08-10 ENCOUNTER — Encounter (HOSPITAL_BASED_OUTPATIENT_CLINIC_OR_DEPARTMENT_OTHER): Payer: Self-pay | Admitting: Registered"

## 2020-08-10 ENCOUNTER — Ambulatory Visit (INDEPENDENT_AMBULATORY_CARE_PROVIDER_SITE_OTHER): Payer: Self-pay | Admitting: Registered"

## 2020-08-10 VITALS — BP 121/77 | HR 81 | Temp 96.9°F | Ht 67.0 in | Wt 225.0 lb

## 2020-08-10 DIAGNOSIS — Z719 Counseling, unspecified: Secondary | ICD-10-CM

## 2020-08-10 DIAGNOSIS — Z9884 Bariatric surgery status: Secondary | ICD-10-CM

## 2020-08-10 DIAGNOSIS — Z713 Dietary counseling and surveillance: Secondary | ICD-10-CM

## 2020-08-10 NOTE — Progress Notes (Signed)
S:  Pt presents for f/u after sleeve procedure.  Pt states tolerating clear liquid diet well.  Pt reports drinking 48-64 oz clear fluids daily and consuming about 30-60 grams protein from supplemental sources daily.  Pt reports taking all the recommended vitamin/mineral supplements at this time.  Pt is 13 days out from surgery and has lost 10 pounds so far.  Pt reports no issues w N/V/C/D at this time.    O:  Today's Wt:   225 lbs   Previous Wts:    Wt Readings from Last 10 Encounters:   08/10/20 225 lb   07/28/20 235 lb 6.4 oz   07/20/20 237 lb   06/22/20 247 lb   06/08/20 244 lb   05/18/20 240 lb   05/13/20 245 lb   05/10/20 250 lb   05/17/20 258 lb   03/18/20 249 lb    BMI:  There is no height or weight on file to calculate BMI.    A:  Pt appears to be getting adequate hydration and protein as evidenced by diet recall.  Pt verbalizes comprehension and desired compliance with next diet stage.    P:  1.  Advance diet to full liquids on 10/20 and then to soft diet on 10/27.  Diet guidelines reviewed and pt verbalized understanding and desired compliance.  2.  F/u in 6 weeks for diet advance to solids.      Spent a total of 15 minutes educating pt in a individual one-on-one setting.

## 2020-08-10 NOTE — Progress Notes (Signed)
Assessment:  Status Post Robotic/laparoscopic SG-Sleeve      Plan:  1.  Diet advance as per dietitian  2.  Patient is to increase exercise.  3.  Patient is to continue with supplements.  4.  Patient is to follow up with PCP as needed.  5.  Patient is to follow up in 6 week(s)          Subjective:  [name] returns for follow up 2 week(s) post robotic/laparoscopic SG-Sleeve. She is doing well with 23 lbs. total weight loss. She is tolerating a liquid diet and denies any nausea, vomiting, reflux or abdominal pain.  She is compliant with portion control and vitamin supplementation. Her energy level is good. She is walking regularly  Objective:  Blood pressure 121/77, pulse 81, temperature (!) 96.9 F (36.1 C), temperature source Temporal, height 5\' 7" , weight 225 lb.     The following portions of the patient's history were reviewed and updated as appropriate: allergies, current medications, past family history, past medical history, past social history, past surgical history and problem list.    General appearance: alert, appears stated age and cooperative  Head: Normocephalic, without obvious abnormality, atraumatic  Abdomen: Soft, nondistended, positive bowel sounds.  Incisions are clean, dry, and intact  Extremities: extremities normal, atraumatic, no cyanosis or edema, Homans sign is negative, no sign of DVT and no edema, redness or tenderness in the calves or thighs  A female chaperone was present during the entire physical exam, Timoteo Ace    Admission on 07/28/2020, Discharged on 07/29/2020   Component Date Value Ref Range Status    ABO Rh 07/28/2020 A POS   Final    AB Screen Gel 07/28/2020 NEG   Final    TUMOR MARKER BHCG QUANT 07/29/2020 4.0   Final    Comment: HCG concentrations above the reference interval can  occur if the patient is hypogonadal. Inadequate  negative feedback to the pituitary, due to low sex  hormone levels, may result in elevated HCG. It  is  recommended that serum luteinizing hormone (LH) or  follicle stimulating hormone [facility]) be determined to  assess this possibility.    -------------------REFERENCE VALUE--------------------------  Females:  <1.0  (Premenopausal,  non-pregnant)  <7.0  (Postmenopausal)    -------------------ADDITIONAL INFORMATION-------------------  This test has been modified from the manufacturer's  instructions. Its performance characteristics were  determined by [facility] in a manner consistent with  CLIA requirements. This test has not been cleared or  approved by the U.S. Food and Drug Administration.    The testing method is an electrochemiluminescence  assay manufactured by [employer]. and  performed on the Modular or [employer] system.    Values obtained with different assay methods or ki                           ts  may be different and cannot be used interchangeably.    Test results cannot be interpreted as absolute evidence  for the presence or absence of malignant disease.    Test Performed by:  [facility]  [address] [place], [place], [place] [address]  Lab Director: Paul Dykes M.D. Ph.D.; CLIA# [id number]      ABO Rh 07/28/2020 A POS   Final    Glucose 07/29/2020 94  70 - 100 mg/dL Final    Comment: ADA guidelines for diabetes mellitus:  Fasting:  Equal to  or greater than 126 mg/dL  Random:   Equal to or greater than 200 mg/dL      BUN 16/07/9603 7  7 - 19 mg/dL Final    Creatinine 54/06/8118 0.8  0.6 - 1.0 mg/dL Final    Calcium 14/78/2956 9.1  8.5 - 10.5 mg/dL Final    Sodium 21/30/8657 135* 136 - 145 mEq/L Final    Potassium 07/29/2020 4.6  3.5 - 5.1 mEq/L Final    Chloride 07/29/2020 102  100 - 111 mEq/L Final    CO2 07/29/2020 20* 22 - 29 mEq/L Final    Anion Gap 07/29/2020 13.0  5 - 15 Final    Comment: Calculated AGAP = Na - (CL + CO2)  Interpret with caution; calculated AGAP may not reflect patient's  true clinical status.      WBC 07/29/2020  8.87  3.1 - 9.5 x10 3/uL Final    Hgb 07/29/2020 13.4  11.4 - 14.8 g/dL Final    Hematocrit 84/69/6295 41.5  34.7 - 43.7 % Final    Platelets 07/29/2020 139* 142 - 346 x10 3/uL Final    RBC 07/29/2020 4.84  3.90 - 5.10 x10 6/uL Final    MCV 07/29/2020 85.7  78.0 - 96.0 fL Final    MCH 07/29/2020 27.7  25.1 - 33.5 pg Final    MCHC 07/29/2020 32.3  31 - 35 g/dL Final    RDW 28/41/3244 14  11.0 - 15.0 % Final    MPV 07/29/2020 13.1* 8.9 - 12.5 fL Final    Nucleated RBC 07/29/2020 0.0  0.0 - 0.0 /100 WBC Final    Absolute NRBC 07/29/2020 0.00  0.00 - 0.00 x10 3/uL Final    Magnesium 07/29/2020 1.8  1.6 - 2.6 mg/dL Final    Phosphorus 10/25/7251 2.3  2.3 - 4.7 mg/dL Final    Creatine Kinase (CK) 07/29/2020 351* 29 - 168 U/L Final    EGFR 07/29/2020 >60.0   Final    Comment: Disease State Reference Ranges:    Chronic Kidney Disease; < 60 ml/min/1.73 sq.m    Kidney Failure; < 15 ml/min/1.73 sq.m    [Calculated using IDMS-Traceable MDRD equation (based on    gender, age and black vs. non-black race) recommended by    [employer] Kidney Disease Education Program. No data    available for non-white, non-black race.]    GFR estimates are unreliable in patients with:    Rapidly changing kidney function or recent dialysis,    extreme age, body size or body composition(obesity,    severe malnutrition). Abnormal muscle mass (limb    amputation, muscle wasting). In these patients,    alternative determinations of GFR should be obtained.     Appointment on 06/09/2020   Component Date Value Ref Range Status    H. pylori Breath Test 06/09/2020 NOT DETECTED  NOT DETECTED Final    Comment:   Antimicrobials, proton pump inhibitors, and bismuth  preparations are known to suppress H. pylori, and  ingestion of these prior to H. pylori diagnostic  testing may lead to false negative results.  If clinically indicated, the test may be repeated  on a new specimen obtained two weeks after  discontinuing treatment. However, a positive  result is  still clinically valid.    Test Performed by Clerance Lav,  [facility],  [address] [place], [place] [address]  Si Raider, M.D., Ph.D., Director of Laboratories  [phone], CLIA [id number]     Appointment on 06/09/2020  Component Date Value Ref Range Status    Vitamin A 06/09/2020 46.7  32.5 - 78.0 Final    Comment:   -------------------ADDITIONAL INFORMATION-------------------  This test was developed and its performance characteristics  determined by [facility] in a manner consistent with CLIA  requirements. This test has not been cleared or approved by  the U.S. Food and Drug Administration.    Test Performed by:  [facility]  [address] [place], [place], [place] [address]  Lab Director: Paul Dykes M.D. Ph.D.; CLIA# [id number]      Vitamin B1 (Thiamine) 06/09/2020 9  8 - 30 nmol/L Final    Comment:   Vitamin supplementation within 24 hours prior to  blood draw may affect the accuracy of the results.    This test was developed and its analytical performance  characteristics have been determined by [facility] [place], [place]. It has  not been cleared or approved by the U.S. Food and Drug  Administration. This assay has been validated pursuant  to the CLIA regulations and is used for clinical  purposes.    Test Performed by Clerance Lav,  [facility],  [address] [place], [place] [address]  Si Raider, M.D., Ph.D., Director of Laboratories  [phone], CLIA [id number]      Cholesterol 06/09/2020 208* 0 - 199 mg/dL Final    Triglycerides 06/09/2020 98  34 - 149 mg/dL Final    HDL 32/44/0102 63  40 - 9,999 mg/dL Final    Comment: An HDL cholesterol <40 mg/dL is low and constitutes a  coronary heart disease risk factor, and HDL-C>59 mg/dL is  a negative risk factor for CHD.  Ref: American Heart Association; Circulation 2004      LDL Calculated  06/09/2020 125* 0 - 99 mg/dL Final    VLDL Calculated 06/09/2020 20  10 - 40 mg/dL Final    Cholesterol / HDL Ratio 06/09/2020 3.3  See Below Final    Comment: Chol/HDL Ratio:  Classification                   Female     Female  Very Low (1/2 Average Risk)      <3.4     <3.3  Low Risk                         4.0      3.8  Average Risk                     5.0      4.5  Moderate Risk (2X Average risk)  9.5      7.0  High Risk (3X Average Risk)      >23.0    >11.0      Folate 06/09/2020 9.0  See below ng/mL Final    Comment: Deficient    : <3.5 ng/mL  Intermediate : 3.5 - 5.4 ng/mL  Normal       : Greater Than 5.4 ng/mL      Vitamin B-12 06/09/2020 528  211 - 911 pg/mL Final    WBC 06/09/2020 4.79  3.1 - 9.5 x10 3/uL Final    Hgb 06/09/2020 13.3  11.4 - 14.8 g/dL Final    Hematocrit 72/53/6644 43.4  34.7 - 43.7 % Final    Platelets 06/09/2020 177  142 - 346 x10 3/uL Final    RBC 06/09/2020  4.92  3.90 - 5.10 x10 6/uL Final    MCV 06/09/2020 88.2  78.0 - 96.0 fL Final    MCH 06/09/2020 27.0  25.1 - 33.5 pg Final    MCHC 06/09/2020 30.6* 31 - 35 g/dL Final    RDW 16/07/9603 14  11.0 - 15.0 % Final    MPV 06/09/2020 13.0* 8.9 - 12.5 fL Final    Nucleated RBC 06/09/2020 0.0  0.0 - 0.0 /100 WBC Final    Absolute NRBC 06/09/2020 0.00  0.00 - 0.00 x10 3/uL Final    Glucose 06/09/2020 90  70 - 100 mg/dL Final    Comment: ADA guidelines for diabetes mellitus:  Fasting:  Equal to or greater than 126 mg/dL  Random:   Equal to or greater than 200 mg/dL      BUN 54/06/8118 14.7  7 - 19 mg/dL Final    Creatinine 82/95/6213 1.0  0.4 - 1.5 mg/dL Final    Sodium 08/65/7846 141  136 - 145 mEq/L Final    Potassium 06/09/2020 4.3  3.5 - 5.1 mEq/L Final    Chloride 06/09/2020 106  100 - 111 mEq/L Final    CO2 06/09/2020 27  21 - 29 mEq/L Final    Calcium 06/09/2020 9.3  8.5 - 10.5 mg/dL Final    Protein, Total 06/09/2020 7.2  6.0 - 8.3 g/dL Final    Albumin 96/29/5284 3.8  3.5 - 5.0 g/dL Final    AST (SGOT)  13/24/4010 30  5 - 34 U/L Final    ALT 06/09/2020 35  0 - 55 U/L Final    Alkaline Phosphatase 06/09/2020 92  37 - 106 U/L Final    Bilirubin, Total 06/09/2020 0.7  0.2 - 1.2 mg/dL Final    Globulin 27/25/3664 3.4  2.0 - 3.7 g/dL Final    Albumin/Globulin Ratio 06/09/2020 1.1  0.9 - 2.2 Final    Anion Gap 06/09/2020 8.0  5 - 15 Final    Comment: Calculated AGAP = Na - (CL + CO2)  Interpret with caution; calculated AGAP may not reflect patient's  true clinical status.      Iron 06/09/2020 105  40 - 145 ug/dL Final    UIBC 40/34/7425 166  126 - 382 ug/dL Final    TIBC 95/63/8756 271  265 - 497 ug/dL Final    Iron Saturation 06/09/2020 39  15 - 50 % Final    PTH Intact 06/09/2020 107.4* 9.0 - 72.0 pg/mL Final    Copper 06/09/2020 1.23  0.75 - 1.45 Final    Comment:   -------------------ADDITIONAL INFORMATION-------------------  This test was developed and its performance characteristics  determined by [facility] in a manner consistent with CLIA  requirements. This test has not been cleared or approved by  the U.S. Food and Drug Administration.    Test Performed by:  [facility]  [address] [place], [place], [place] [address]  Lab Director: Paul Dykes M.D. Ph.D.; CLIA# [id number]      Zinc 06/09/2020 0.86  0.66 - 1.10 Final    Comment:   -------------------ADDITIONAL INFORMATION-------------------  This test was developed and its performance characteristics  determined by [facility] in a manner consistent with CLIA  requirements. This test has not been cleared or approved by  the U.S. Food and Drug Administration.    Test Performed by:  [facility]  [address] [place], [place], [place] [address]  Lab Director: Paul Dykes  M.D. Ph.D.; CLIA# [id number]      TSH 06/09/2020 2.04  0.35 - 4.94 uIU/mL Final    Vitamin D, 25 OH, Total 06/09/2020 16* 30 - 100 ng/mL Final    Comment: Vitamin D, 25-OH, Total: Testing performed  using  [employer] chemiluminescent microparticle  immunoassay methodology. Method-dependent minor difference  may exist based on the test platform used.  Vitamin D levels of <20 ng/ml are considered deficiency.  Vitamin D levels of 20-30 ng/ml suggest insufficiency.  Optimal Vitamin D levels are > or =30 ng/ml.      PT 06/09/2020 11.5  10.1 - 12.9 sec Final    PT INR 06/09/2020 1.0  0.9 - 1.1 Final    Comment: Recommended Ranges for Protime INR:  2.0-3.0 for most medical and surgical thromboembolic states  7.8-2.9 for artificial heart valves INR result may not represent  exact Warfarin dosing level during the transition period from  Heparin to Warfarin therapy. Results should be interpreted based  on current anticoagulant therapy and patient's clinical presentation.      Hemoglobin A1C 06/09/2020 5.5  4.6 - 5.9 % Final    Comment: Test performed using [employer] enzymatic method.  HBA1C: Hemoglobin A1c values of 5.7-6.4% indicate an increased  risk for developing diabetes mellitus. Hemoglobin A1c values  greater than or equal to 6.5% are diagnostic of diabetes  mellitus. A Triglyceride result greater than 3000 mg/dL can  falsely decrease FAO1H results.      Average Estimated Glucose 06/09/2020 111.2  mg/dL Final    Hemolysis Index 06/09/2020 1  0 - 18 Final    EGFR 06/09/2020 55.2   Final    Comment: Disease State Reference Ranges:    Chronic Kidney Disease; < 60 ml/min/1.73 sq.m    Kidney Failure; < 15 ml/min/1.73 sq.m    [Calculated using IDMS-Traceable MDRD equation (based on    gender, age and black vs. non-black race) recommended by    [employer] Kidney Disease Education Program. No data    available for non-white, non-black race.]    GFR estimates are unreliable in patients with:    Rapidly changing kidney function or recent dialysis,    extreme age, body size or body composition(obesity,    severe malnutrition). Abnormal muscle mass (limb    amputation, muscle wasting). In these patients,     alternative determinations of GFR should be obtained.     Office Visit on 05/13/2020   Component Date Value Ref Range Status    Glucose 05/13/2020 87  70 - 100 mg/dL Final    Comment: ADA guidelines for diabetes mellitus:  Fasting:  Equal to or greater than 126 mg/dL  Random:   Equal to or greater than 200 mg/dL      BUN 08/65/7846 96.2  7 - 19 mg/dL Final    Creatinine 95/28/4132 1.0  0.4 - 1.5 mg/dL Final    Sodium 44/10/270 143  136 - 145 mEq/L Final    Potassium 05/13/2020 4.4  3.5 - 5.1 mEq/L Final    Chloride 05/13/2020 108  100 - 111 mEq/L Final    CO2 05/13/2020 23  21 - 29 mEq/L Final    Calcium 05/13/2020 10.0  8.5 - 10.5 mg/dL Final    Protein, Total 05/13/2020 7.4  6.0 - 8.3 g/dL Final    Albumin 53/66/4403 4.0  3.5 - 5.0 g/dL Final    AST (SGOT) 47/42/5956 19  5 - 34 U/L Final    ALT 05/13/2020 16  0 -  55 U/L Final    Alkaline Phosphatase 05/13/2020 100  37 - 106 U/L Final    Bilirubin, Total 05/13/2020 0.7  0.2 - 1.2 mg/dL Final    Globulin 16/07/9603 3.4  2.0 - 3.7 g/dL Final    Albumin/Globulin Ratio 05/13/2020 1.2  0.9 - 2.2 Final    Anion Gap 05/13/2020 12.0  5 - 15 Final    Comment: Calculated AGAP = Na - (CL + CO2)  Interpret with caution; calculated AGAP may not reflect patient's  true clinical status.      Cholesterol 05/13/2020 216* 0 - 199 mg/dL Final    Triglycerides 05/13/2020 106  34 - 149 mg/dL Final    HDL 54/06/8118 63  40 - 9,999 mg/dL Final    Comment: An HDL cholesterol <40 mg/dL is low and constitutes a  coronary heart disease risk factor, and HDL-C>59 mg/dL is  a negative risk factor for CHD.  Ref: American Heart Association; Circulation 2004      LDL Calculated 05/13/2020 147* 0 - 99 mg/dL Final    VLDL Calculated 05/13/2020 21  10 - 40 mg/dL Final    Cholesterol / HDL Ratio 05/13/2020 3.4  See Below Final    Comment: Chol/HDL Ratio:  Classification                   Female     Female  Very Low (1/2 Average Risk)      <3.4     <3.3  Low Risk                          4.0      3.8  Average Risk                     5.0      4.5  Moderate Risk (2X Average risk)  9.5      7.0  High Risk (3X Average Risk)      >23.0    >11.0      WBC 05/13/2020 4.95  3.1 - 9.5 x10 3/uL Final    Hgb 05/13/2020 13.0  11.4 - 14.8 g/dL Final    Hematocrit 82/95/6213 42.6  34.7 - 43.7 % Final    Platelets 05/13/2020 165  142 - 346 x10 3/uL Final    RBC 05/13/2020 4.78  3.90 - 5.10 x10 6/uL Final    MCV 05/13/2020 89.1  78.0 - 96.0 fL Final    MCH 05/13/2020 27.2  25.1 - 33.5 pg Final    MCHC 05/13/2020 30.5* 31 - 35 g/dL Final    RDW 08/65/7846 14  11.0 - 15.0 % Final    MPV 05/13/2020 13.6* 8.9 - 12.5 fL Final    Neutrophils 05/13/2020 46.7  None % Final    Lymphocytes Automated 05/13/2020 43.8  None % Final    Monocytes 05/13/2020 7.3  None % Final    Eosinophils Automated 05/13/2020 1.2  None % Final    Basophils Automated 05/13/2020 0.6  None % Final    Immature Granulocytes 05/13/2020 0.4  None % Final    Nucleated RBC 05/13/2020 0.0  0.0 - 0.0 /100 WBC Final    Neutrophils Absolute 05/13/2020 2.31  1 - 6 x10 3/uL Final    Lymphocytes Absolute Automated 05/13/2020 2.17  0.42 - 3.22 x10 3/uL Final    Monocytes Absolute Automated 05/13/2020 0.36  0.21 - 0.85 x10 3/uL Final    Eosinophils Absolute  Automated 05/13/2020 0.06  0.00 - 0.44 x10 3/uL Final    Basophils Absolute Automated 05/13/2020 0.03  0.00 - 0.08 x10 3/uL Final    Immature Granulocytes Absolute 05/13/2020 0.02  0.00 - 0.07 x10 3/uL Final    Absolute NRBC 05/13/2020 0.00  0.00 - 0.00 x10 3/uL Final    Hemolysis Index 05/13/2020 12  0 - 18 Final    EGFR 05/13/2020 55.2   Final    Comment: Disease State Reference Ranges:    Chronic Kidney Disease; < 60 ml/min/1.73 sq.m    Kidney Failure; < 15 ml/min/1.73 sq.m    [Calculated using IDMS-Traceable MDRD equation (based on    gender, age and black vs. non-black race) recommended by    [employer] Kidney Disease Education Program. No data    available for non-white,  non-black race.]    GFR estimates are unreliable in patients with:    Rapidly changing kidney function or recent dialysis,    extreme age, body size or body composition(obesity,    severe malnutrition). Abnormal muscle mass (limb    amputation, muscle wasting). In these patients,    alternative determinations of GFR should be obtained.         No results found..         No results found.      This note was generated by the [facility] EMR system/Dragon speech recognition and may contain inherent errors or omissions not intended by the user. Grammatical errors, random word insertions, deletions, pronoun errors and incomplete sentences are occasional consequences of this technology due to software limitations. Not all errors are caught or corrected. If there are questions or concerns about the content of this note or information contained within the body of this dictation they should be addressed directly with the author for clarification.    Barbara Rubio R. Barbara Harter, DO, FACS, FASMBS, FACOS

## 2020-08-30 ENCOUNTER — Encounter (INDEPENDENT_AMBULATORY_CARE_PROVIDER_SITE_OTHER): Payer: Self-pay

## 2020-08-31 ENCOUNTER — Encounter (INDEPENDENT_AMBULATORY_CARE_PROVIDER_SITE_OTHER): Payer: Self-pay

## 2020-09-21 ENCOUNTER — Telehealth (HOSPITAL_BASED_OUTPATIENT_CLINIC_OR_DEPARTMENT_OTHER): Payer: Self-pay | Admitting: Registered"

## 2020-09-21 ENCOUNTER — Telehealth (HOSPITAL_BASED_OUTPATIENT_CLINIC_OR_DEPARTMENT_OTHER): Payer: Medicare Other | Admitting: Registered"

## 2020-09-29 ENCOUNTER — Encounter (INDEPENDENT_AMBULATORY_CARE_PROVIDER_SITE_OTHER): Payer: Self-pay

## 2020-09-30 ENCOUNTER — Encounter (INDEPENDENT_AMBULATORY_CARE_PROVIDER_SITE_OTHER): Payer: Self-pay

## 2020-10-30 ENCOUNTER — Encounter (INDEPENDENT_AMBULATORY_CARE_PROVIDER_SITE_OTHER): Payer: Self-pay

## 2020-10-31 ENCOUNTER — Encounter (INDEPENDENT_AMBULATORY_CARE_PROVIDER_SITE_OTHER): Payer: Self-pay

## 2020-11-12 ENCOUNTER — Ambulatory Visit (HOSPITAL_BASED_OUTPATIENT_CLINIC_OR_DEPARTMENT_OTHER): Payer: Medicare Other | Admitting: Surgery

## 2020-11-12 ENCOUNTER — Other Ambulatory Visit (FREE_STANDING_LABORATORY_FACILITY): Payer: Medicare Other

## 2020-11-12 DIAGNOSIS — Z9884 Bariatric surgery status: Secondary | ICD-10-CM

## 2020-11-12 LAB — PTH, INTACT: PTH Intact: 87.7 pg/mL — ABNORMAL HIGH (ref 17.7–84.5)

## 2020-11-12 LAB — COMPREHENSIVE METABOLIC PANEL
ALT: 16 U/L (ref 0–55)
AST (SGOT): 20 U/L (ref 5–34)
Albumin/Globulin Ratio: 1.3 (ref 0.9–2.2)
Albumin: 3.8 g/dL (ref 3.5–5.0)
Alkaline Phosphatase: 91 U/L (ref 37–117)
Anion Gap: 6 (ref 5.0–15.0)
BUN: 8 mg/dL (ref 7.0–19.0)
Bilirubin, Total: 0.7 mg/dL (ref 0.2–1.2)
CO2: 28 mEq/L (ref 21–29)
Calcium: 9.5 mg/dL (ref 8.5–10.5)
Chloride: 107 mEq/L (ref 100–111)
Creatinine: 0.8 mg/dL (ref 0.4–1.5)
Globulin: 3 g/dL (ref 2.0–3.7)
Glucose: 89 mg/dL (ref 70–100)
Potassium: 4.6 mEq/L (ref 3.5–5.1)
Protein, Total: 6.8 g/dL (ref 6.0–8.3)
Sodium: 141 mEq/L (ref 136–145)

## 2020-11-12 LAB — IRON PROFILE
Iron Saturation: 42 % (ref 15–50)
Iron: 120 ug/dL (ref 40–145)
TIBC: 288 ug/dL (ref 265–497)
UIBC: 168 ug/dL (ref 126–382)

## 2020-11-12 LAB — VITAMIN B12: Vitamin B-12: 631 pg/mL (ref 211–911)

## 2020-11-12 LAB — GFR: EGFR: >60

## 2020-11-12 LAB — CBC
Absolute NRBC: 0 10*3/uL (ref 0.00–0.00)
Hematocrit: 41.2 % (ref 34.7–43.7)
Hgb: 12.4 g/dL (ref 11.4–14.8)
MCH: 27.4 pg (ref 25.1–33.5)
MCHC: 30.1 g/dL — ABNORMAL LOW (ref 31.5–35.8)
MCV: 91.2 fL (ref 78.0–96.0)
MPV: 14 fL — ABNORMAL HIGH (ref 8.9–12.5)
Nucleated RBC: 0 /100 WBC (ref 0.0–0.0)
Platelets: 153 10*3/uL (ref 142–346)
RBC: 4.52 10*6/uL (ref 3.90–5.10)
RDW: 15 % (ref 11–15)
WBC: 4.46 10*3/uL (ref 3.10–9.50)

## 2020-11-12 LAB — HEMOLYSIS INDEX: Hemolysis Index: 31 — ABNORMAL HIGH (ref 0–24)

## 2020-11-12 LAB — LIPID PANEL
Cholesterol / HDL Ratio: 3.7
Cholesterol: 201 mg/dL — ABNORMAL HIGH (ref 0–199)
HDL: 54 mg/dL (ref 40–9999)
LDL Calculated: 130 mg/dL — ABNORMAL HIGH (ref 0–99)
Triglycerides: 86 mg/dL (ref 34–149)
VLDL Calculated: 17 mg/dL (ref 10–40)

## 2020-11-12 LAB — FOLATE: Folate: 8.5 ng/mL

## 2020-11-12 LAB — TSH: TSH: 1.45 u[IU]/mL (ref 0.35–4.94)

## 2020-11-12 LAB — VITAMIN D,25 OH,TOTAL: Vitamin D, 25 OH, Total: 39 ng/mL (ref 30–100)

## 2020-11-15 LAB — VITAMIN B1, PLASMA: Vitamin B1 (Thiamine): 6 nmol/L — ABNORMAL LOW (ref 8–30)

## 2020-11-15 LAB — ZINC: Zinc: 0.79 (ref 0.66–1.10)

## 2020-11-15 LAB — COPPER, SERUM: Copper: 1.24 (ref 0.75–1.45)

## 2020-11-16 LAB — VITAMIN A: Vitamin A: 38.4 (ref 32.5–78.0)

## 2020-11-23 ENCOUNTER — Ambulatory Visit (HOSPITAL_BASED_OUTPATIENT_CLINIC_OR_DEPARTMENT_OTHER): Payer: Medicare Other | Admitting: Surgery

## 2020-11-26 ENCOUNTER — Telehealth (HOSPITAL_BASED_OUTPATIENT_CLINIC_OR_DEPARTMENT_OTHER): Payer: Medicare Other | Admitting: Surgery

## 2020-11-29 ENCOUNTER — Telehealth (INDEPENDENT_AMBULATORY_CARE_PROVIDER_SITE_OTHER): Payer: Medicare Other | Admitting: Nurse Practitioner

## 2020-11-29 ENCOUNTER — Encounter (HOSPITAL_BASED_OUTPATIENT_CLINIC_OR_DEPARTMENT_OTHER): Payer: Self-pay | Admitting: Nurse Practitioner

## 2020-11-29 VITALS — Ht 67.0 in | Wt 206.0 lb

## 2020-11-29 DIAGNOSIS — E519 Thiamine deficiency, unspecified: Secondary | ICD-10-CM

## 2020-11-29 DIAGNOSIS — K912 Postsurgical malabsorption, not elsewhere classified: Secondary | ICD-10-CM

## 2020-11-29 DIAGNOSIS — R7989 Other specified abnormal findings of blood chemistry: Secondary | ICD-10-CM

## 2020-11-29 DIAGNOSIS — K9089 Other intestinal malabsorption: Secondary | ICD-10-CM

## 2020-11-29 DIAGNOSIS — E669 Obesity, unspecified: Secondary | ICD-10-CM

## 2020-11-29 DIAGNOSIS — Z9884 Bariatric surgery status: Secondary | ICD-10-CM

## 2020-11-29 DIAGNOSIS — E559 Vitamin D deficiency, unspecified: Secondary | ICD-10-CM

## 2020-11-29 DIAGNOSIS — Z713 Dietary counseling and surveillance: Secondary | ICD-10-CM

## 2020-11-29 DIAGNOSIS — R110 Nausea: Secondary | ICD-10-CM

## 2020-11-29 MED ORDER — LANSOPRAZOLE 30 MG PO CPDR
30.0000 mg | DELAYED_RELEASE_CAPSULE | Freq: Every day | ORAL | 0 refills | Status: AC
Start: 2020-11-29 — End: 2021-02-27

## 2020-11-29 NOTE — Progress Notes (Addendum)
Progress Note   Patient Name: Barbara Rubio, Barbara Rubio  Age: 70 y.o.  Sex: female   DOB: 1951-02-11  MRN: [id number]    Verbal consent has been obtained from the patient to conduct a video and telephone visit to minimize exposure to COVID-19: yes.  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   She is struggling with compliance. She denies vomiting, diarrhea, or abdominal pain.   She complains of + nausea (states improved with "ginerale"), dumping syndrome X 2, "sneezing after eating", constipation, occasional sensation of reflux (she has stopped PPI).  Portion sizes are 3  oz and she admits to measuring - three meals a day with no snacks. She is not following 30/30 rule . She is not drinking adequate fluids (30 oz a day) or  protein supplements (no protein supplements - states they are too sweet).   She is taking vitamin supplementation. Her energy level is good and she is not exercising regularly, but is active.  She is happy with her weight loss to date, although it has been slow, and has had 42 lbs total weight loss and 19 lbs since LOV.       Past Medical History:     Past Medical History:   Diagnosis Date    Gastroesophageal reflux disease 10/23/2013    Acid reflux alot after meals. TUMS, PRILOSEC     Hyperlipidemia     Hypertension     well controlled    Left wrist fracture 2018    Psoriatic arthritis     LOWER BACK, LEFT HAND AND FINGERS     Urinary incontinence 12/21/2017    Over active bladder. TOVIAZ HELPS        Past Surgical History:     Past Surgical History:   Procedure Laterality Date    BUNIONECTOMY Left     COLONOSCOPY      X 2     EGD N/A 05/18/2020    Procedure: EGD w/ bx's;  Surgeon: Sabas Sous, MD;  Location: [facility];  Service: Gastroenterology;  Laterality: N/A;    HYSTERECTOMY       partial    LAPAROSCOPIC GASTRECTOMY SLEEVE WITH HIATAL HERNIA N/A 07/28/2020     Procedure: LAPAROSCOPIC GASTRECTOMY SLEEVE WITH HIATAL HERNIA, OMENTOPEXY, LAPAROSCOPIC RESECTION OF ANTRAL AND FUNDIC GASTRIC MASS;  Surgeon: Nicola Police, DO;  Location: [facility];  Service: General;  Laterality: N/A;       Family History:     Family History   Problem Relation Age of Onset    Hypertension Mother     Hypertension Father     Hypertension Sister     Hypertension Brother     Migraines Neg Hx     Stroke Neg Hx     Seizures Neg Hx        Allergies:   No Known Allergies    Medications:     Prior to Admission medications    Medication Sig Start Date End Date Taking? Authorizing Provider   atorvastatin (LIPITOR) 10 MG tablet TAKE 1 TABLET BY MOUTH EVERY DAY AT NIGHT 05/26/20   Morrison Old, MD   estradiol (ESTRACE) 0.1 MG/GM vaginal cream TWICE A WEEK   01/21/20   [provider]   Fesoterodine Fumarate (Toviaz) 4 MG Tablet SR 24 hr 2 (two) times daily    03/31/20   [provider]  lansoprazole (PREVACID) 30 MG capsule Take 1 capsule (30 mg total) by mouth daily 07/29/20 01/25/21  Pourshojae, Hamid R, DO   lisinopril (ZESTRIL) 5 MG tablet Take 1 tablet (5 mg total) by mouth daily 06/25/20   Morrison Old, MD   ursodiol (ACTIGALL) 300 MG capsule Take 1 capsule (300 mg total) by mouth 2 (two) times daily Start 2 weeks after surgery 08/11/20 02/07/21  Josefa Half R, DO       Review of Systems:   All systems reviewed and negative except as noted in the subjective.     Physical Exam:   There were no vitals filed for this visit.  BMI: Body mass index is 32.26 kg/m.  Previous Weight:   Wt Readings from Last 15 Encounters:   08/10/20 225 lb   07/28/20 235 lb 6.4 oz   07/20/20 237 lb   06/22/20 247 lb   06/08/20 244 lb   05/18/20 240 lb   05/13/20 245 lb   05/10/20 250 lb   05/17/20 258 lb   03/18/20 249 lb   03/16/20 248 lb   02/27/20 245 lb 9.6 oz   01/27/20 244 lb 12.8 oz   12/24/19 246 lb   06/19/19 242 lb          General: Alert and oriented x3, no distress, well  appearing  HEENT: Normocephalic, anicteric sclera, moist mucous membrane.  Lungs: no respiratory distress,  Abdominal: Reports no abdominal pain, discomfort or lumps  Extremities: No Cyanosis, no reported edema, no clubbing  Neurologic: gait normal, cranial nerves II through XII intact,   Psych: Cooperative and pleasant, mood and affect proper, responds to questions appropriately      Labs Reviewed:     Lab Results   Component Value Date    WBC 4.46 11/12/2020    HGB 12.4 11/12/2020    HCT 41.2 11/12/2020    MCV 91.2 11/12/2020    PLT 153 11/12/2020     '  Chemistry        Component Value Date/Time    NA 141 11/12/2020 1007    K 4.6 11/12/2020 1007    CL 107 11/12/2020 1007    CO2 28 11/12/2020 1007    BUN 8.0 11/12/2020 1007    CREAT 0.8 11/12/2020 1007    GLU 89 11/12/2020 1007        Component Value Date/Time    CA 9.5 11/12/2020 1007    ALKPHOS 91 11/12/2020 1007    AST 20 11/12/2020 1007    ALT 16 11/12/2020 1007    BILITOTAL 0.7 11/12/2020 1007          Lab Results   Component Value Date    ALT 16 11/12/2020    AST 20 11/12/2020    ALKPHOS 91 11/12/2020    BILITOTAL 0.7 11/12/2020     Lab Results   Component Value Date    VITD 39 11/12/2020     Lab Results   Component Value Date    B12 631 11/12/2020     Lab Results   Component Value Date    VITAMINB1 6 (L) 11/12/2020     No results found for: VITAMINARETI  Cholesterol   Date Value Ref Range Status   11/12/2020 201 (H) 0 - 199 mg/dL Final   16/07/9603 540 (H) 0 - 199 mg/dL Final   98/08/9146 829 (H) 0 - 199 mg/dL Final     Triglycerides   Date Value Ref Range Status   11/12/2020  86 34 - 149 mg/dL Final   84/13/2440 98 34 - 149 mg/dL Final   08/19/2535 644 34 - 149 mg/dL Final     HDL   Date Value Ref Range Status   11/12/2020 54 40 - 9,999 mg/dL Final     Comment:     An HDL cholesterol <40 mg/dL is low and constitutes a  coronary heart disease risk factor, and HDL-C>59 mg/dL is  a negative risk factor for CHD.  Ref: American Heart Association; Circulation  2004     06/09/2020 63 40 - 9,999 mg/dL Final     Comment:     An HDL cholesterol <40 mg/dL is low and constitutes a  coronary heart disease risk factor, and HDL-C>59 mg/dL is  a negative risk factor for CHD.  Ref: American Heart Association; Circulation 2004     05/13/2020 63 40 - 9,999 mg/dL Final     Comment:     An HDL cholesterol <40 mg/dL is low and constitutes a  coronary heart disease risk factor, and HDL-C>59 mg/dL is  a negative risk factor for CHD.  Ref: American Heart Association; Circulation 2004       Cholesterol / HDL Ratio   Date Value Ref Range Status   11/12/2020 3.7 See Below Final     Comment:     Chol/HDL Ratio:  Classification                   Female     Female  Very Low (1/2 Average Risk)      <3.4     <3.3  Low Risk                         4.0      3.8  Average Risk                     5.0      4.5  Moderate Risk (2X Average risk)  9.5      7.0  High Risk (3X Average Risk)      >23.0    >11.0     06/09/2020 3.3 See Below Final     Comment:     Chol/HDL Ratio:  Classification                   Female     Female  Very Low (1/2 Average Risk)      <3.4     <3.3  Low Risk                         4.0      3.8  Average Risk                     5.0      4.5  Moderate Risk (2X Average risk)  9.5      7.0  High Risk (3X Average Risk)      >23.0    >11.0     05/13/2020 3.4 See Below Final     Comment:     Chol/HDL Ratio:  Classification                   Female     Female  Very Low (1/2 Average Risk)      <3.4     <3.3  Low Risk  4.0      3.8  Average Risk                     5.0      4.5  Moderate Risk (2X Average risk)  9.5      7.0  High Risk (3X Average Risk)      >23.0    >11.0       Lab Results   Component Value Date    TSH 1.45 11/12/2020     Lab Results   Component Value Date    CA 9.5 11/12/2020    PHOS 2.3 07/29/2020     Lab Results   Component Value Date    IRON 120 11/12/2020    TIBC 288 11/12/2020     Lab Results   Component Value Date    HGBA1C 5.5 06/09/2020       Rads:    Radiological Procedure reviewed.    Radiology Results (24 Hour)     ** No results found for the last 24 hours. **          Assessment and Plan:   4 months post Laparoscopic Sleeve Gastrectomy  and the patient has lost 42 lbs.    She is struggling with reported dietary compliance and lifestyle changes.     # Labs were reviewed: Continue supplements as prescribed with the addition of:    Elevated PTH: Improved from Aug but still elevated. 05/2020 @ 107 and 10/2020 @ 87. In Aug her Vit D was @ 69 and in Jan @ 39.   Increase Vit D intake. Add extra Vit D (5,000 IU) three times a week   Increase Calcium intake from one 400 mg once a day to 400 mg three times a day - separate from iron and Mvi     Low B1: B1 @ 6.  She confirms she is taking B complex . Add 100 mg Thiamine twice a day for two weeks - then continue 100 mg once a day.     # Reflux & Nausea: Go back on Lansoprazole 30 mg once a day. Will send 90 day supply to pharmacy.  Nausea could also be r/t low B1. If no improvemnt  in 2-3 weeks consider stopping Ursodiol. Instructed her to message me with update.     # Stressed importance of adequate fluid and protein intake. Increase fluid intake to at least 64 oz a day.   and start using protein supplements as snacks two times a day w/ goal of 50 -65 grams a day from supplements. I will send unflavored recommendations to patient.     # Stressed the importance of following the 30/30 rule as well as avoiding carbonated beverages.    #Make apt with RD asap. I will ask front desk to call pt to schedule.     #Constipation   Constipation is a common problem after bariatric surgery. This is what we would recommend you take to help with managing it:     - Miralax daily  - Drink at least 80 oz fluids daily   - Dulcolax suppository daily x 3 days to get things moving   - Enemas, as needed, to soften hard stools   - Once you start having softer stools, add in a fiber supplement daily like sugar-free Citrucel, Metamucil or  Benefiber     Plan/instructions sent to pt via my chart.      We will continue to monitor your labs and  nutritional status at regular intervals, typically at 3, 6, 9, 12, 18 and 24 months after surgery.   I have entered fasting lab orders to be completed before next visit: Fasting labs before next visit. HOLD vitamins 24 hours prior to labs.     Three Month post op millstones were discussed:    Regular diet -  encouraged to choose mostly high-protein foods and to avoid foods that are usually not well tolerated. You will continue practicing all the good eating habits you learned as you were preparing for surgery.  Foods to avoid: rice, bread, pasta, dry/tough meats, high-sugar foods, high-fat foods, and fibrous vegetables (e.g., raw celery, asparagus, raw cabbage). At  3-6 months post op you should be consuming 3 oz  or 1/3 cup at each meal unless other instructed by your RD.   Beverages to avoid: carbonated beverages, whole or 2% milk, fruit juice, smoothies, energy drinks, caffeine, and alcohol.      1. Continue monitoring and measuring portions. Increase protein from supplements daily and Increase fluid intake for a minimum of 64 oz daily.   2. Increase exercise as tolerated.  3. Follow up with PCP regularly.   4. Medications reviewed. Continue vitamins with any changes as noted above.    5. Routine care in 3 months with labs or sooner if needed.     *Note general follow up recommendations:   2 weeks post op: NP/MD and RD  6 weeks post op: RD  12 weeks post op: NP/MD and RD  6, 9, and 12 months post op: NP/MD and RD  18 and 24 months post op: NP/MD and RD  Depending on progress at 24 months you may be followed every 6 months or yearly  Meet with behavior health specialist at 6 and 12 months for support, and more frequently if necessary      Over half, 50%, of the time of face to face interaction was spent discussing and counseling the patient on their bariatric follow up, weight loss, life style modification,  post operative and life long management concerns.     Return in about 3 months (around 02/26/2021), or NP,RD.    Signed by: Jodelle Gross Skarlette Lattner, NP, NP-C    This note was generated by the [facility] EMR system/Dragon speech recognition and may contain inherent errors or omissions not intended by the user. Grammatical errors, random word insertions, deletions, pronoun errors and incomplete sentences are occasional consequences of this technology due to software limitations. Not all errors are caught or corrected. If there are questions or concerns about the content of this note or information contained within the body of this dictation they should be addressed directly with the author for clarification.

## 2020-11-30 ENCOUNTER — Encounter (INDEPENDENT_AMBULATORY_CARE_PROVIDER_SITE_OTHER): Payer: Self-pay

## 2020-12-01 ENCOUNTER — Encounter (INDEPENDENT_AMBULATORY_CARE_PROVIDER_SITE_OTHER): Payer: Self-pay

## 2020-12-08 ENCOUNTER — Encounter (HOSPITAL_BASED_OUTPATIENT_CLINIC_OR_DEPARTMENT_OTHER): Payer: Self-pay | Admitting: Registered"

## 2020-12-08 ENCOUNTER — Telehealth (INDEPENDENT_AMBULATORY_CARE_PROVIDER_SITE_OTHER): Payer: Medicare Other | Admitting: Registered"

## 2020-12-08 DIAGNOSIS — Z713 Dietary counseling and surveillance: Secondary | ICD-10-CM

## 2020-12-08 DIAGNOSIS — Z719 Counseling, unspecified: Secondary | ICD-10-CM

## 2020-12-08 DIAGNOSIS — Z9884 Bariatric surgery status: Secondary | ICD-10-CM

## 2020-12-08 NOTE — Progress Notes (Signed)
Verbal consent has been obtained from the patient to conduct a telephone/video visit encounter to minimize exposure to COVID-19: YES    S:  Pt presents for 4 month post sleeve surgery diet follow up.  She was encouraged to f/u with RD due dietary noncompliance resulting in some nausea and vomiting.  Pt has lost 29 pounds at this time which is somewhat lower than expected at this point post operatively.  Pt states consuming about 3-4 oz per meal.  Pt reports measuring her foods occasionally, but will use a food scale.  Pt continues with all recommended vitamin/mineral supplements at this time.  Pt reports she hasn't been consistent with the 30/30 rule.  Pt also reports previously consuming 0 grams protein daily from a supplemental source, but since her last office visit has started with 2 Premier Protein shakes daily.  For physical activity, pt reports participating in:  lifestyle activity only.  Currently, pt is able to consume about 30 oz clear fluids daily.  Pt reports she thought she had dumping syndrome, but from her symptom report, this was actually just vomiting from eating too much/eating too fast.  Pt reports recently having episodes of sneezing after she eats, however hasn't happened recently.    O:  Today's Wt:    206 lbs    Previous Visit Wt:    Wt Readings from Last 10 Encounters:   11/29/20 206 lb   08/10/20 225 lb   07/28/20 235 lb 6.4 oz   07/20/20 237 lb   06/22/20 247 lb   06/08/20 244 lb   05/18/20 240 lb   05/13/20 245 lb   05/10/20 250 lb   05/17/20 258 lb     BMI:  There is no height or weight on file to calculate BMI.  Total Wt Loss:  29 lbs  Surgery Date:  07/28/2020    Allergies:  No Known Allergies    Diet Recall:   Breakfast:  1 sausage, oatmeal and 1 scrambled egg/yogurt   Lunch:  Soup w crackers OR fish sandwich from [employer] w the bread   Dinner:  Salad with meat   Snacks:  Protein shakes (Premier Protein), cheez-its   Beverages:  Water, juice   Alcohol:  none    A:  Per diet recall, pt  consumes slightly larger than recommended portion sizes which is most likely why she's experiencing the "stuck" feeling, "foamies" and the sneezing.  Pt needs to continue to actively work on changing her schedule to allow for her 3 meals and her protein shakes.  She's also having fast food for lunch and using the fact that the fried fish feels good on her stomach as a reason to continue to include this into her diet.   Pt will benefit from meal prep to help her stick to healthier choices and feel less rushed at her lunch time at work.  Food choices are acceptable and appropriate.  She's also drinking juice, which should be minimized in the diet after surgery due to sugar content.  Pt appears motivated to continue monitoring calories, exercising regularly and following recommended dietary guidelines to continue and then maintain weight loss.    P:  1.  Pt to decrease portion sizes to 2.5-3 oz per meal.  Pt encouraged to measure all portions.  2.  Pt to continue using protein supplements for snacks at this time.  Pt to d/c all other snacking at this time.  3.  Pt to decrease her consumption of fruit juices (  except prune juice PRN).  4.  Pt to work on chewing thoroughly, eating slowly and taking small bites to prevent negative feedback.  5.  Pt also to continue working on her schedule to find the best times for her meals and shakes in order to ensure she gets the required amount in daily.  6.  Pt to f/u in 3 months or PRN.  Pt provided contact information for any questions or concerns.    Spent a total of 15 minutes educating pt in a individual one-on-one setting.  Plan reviewed with surgeon.

## 2020-12-15 ENCOUNTER — Telehealth (INDEPENDENT_AMBULATORY_CARE_PROVIDER_SITE_OTHER): Payer: Self-pay | Admitting: Family

## 2020-12-15 NOTE — Telephone Encounter (Signed)
-----   Message from [place] sent at 12/15/2020  9:37 AM EST -----  Regarding: Lansoprazole 1pd or 2pd  Hi    This is the patient I asked you about in the hallway. She said she isnt getting relief from 1 per day 30 mg. She still has to take tums.       Barbara Rubio

## 2020-12-16 ENCOUNTER — Telehealth (HOSPITAL_BASED_OUTPATIENT_CLINIC_OR_DEPARTMENT_OTHER): Payer: Self-pay | Admitting: Nurse Practitioner

## 2020-12-16 ENCOUNTER — Encounter (HOSPITAL_BASED_OUTPATIENT_CLINIC_OR_DEPARTMENT_OTHER): Payer: Self-pay | Admitting: Nurse Practitioner

## 2020-12-16 NOTE — Telephone Encounter (Addendum)
My chart message and telephone call placed (LVM) to Pt to F/U re: continued acid reflux & epigastric discomfort.     Addendum 12/17/20: communicated via my chart - instructed her to get upper GI to evaluate -order placed, results with dictate management.

## 2020-12-16 NOTE — Telephone Encounter (Signed)
Yes I will thanks.

## 2020-12-17 ENCOUNTER — Other Ambulatory Visit (HOSPITAL_BASED_OUTPATIENT_CLINIC_OR_DEPARTMENT_OTHER): Payer: Self-pay | Admitting: Nurse Practitioner

## 2020-12-17 DIAGNOSIS — R1010 Upper abdominal pain, unspecified: Secondary | ICD-10-CM

## 2020-12-17 DIAGNOSIS — Z9884 Bariatric surgery status: Secondary | ICD-10-CM

## 2020-12-17 DIAGNOSIS — K219 Gastro-esophageal reflux disease without esophagitis: Secondary | ICD-10-CM

## 2020-12-21 ENCOUNTER — Ambulatory Visit (INDEPENDENT_AMBULATORY_CARE_PROVIDER_SITE_OTHER): Payer: Medicare Other | Admitting: Internal Medicine

## 2020-12-21 HISTORY — PX: EGD: SHX3789

## 2020-12-24 ENCOUNTER — Ambulatory Visit (INDEPENDENT_AMBULATORY_CARE_PROVIDER_SITE_OTHER): Payer: Medicare Other | Admitting: Internal Medicine

## 2020-12-24 ENCOUNTER — Ambulatory Visit
Admission: RE | Admit: 2020-12-24 | Discharge: 2020-12-24 | Disposition: A | Discharge status: Home / Self Care | Payer: Medicare Other | Source: Ambulatory Visit | Attending: Nurse Practitioner | Admitting: Nurse Practitioner

## 2020-12-24 ENCOUNTER — Other Ambulatory Visit (HOSPITAL_BASED_OUTPATIENT_CLINIC_OR_DEPARTMENT_OTHER): Payer: Self-pay | Admitting: Nurse Practitioner

## 2020-12-24 DIAGNOSIS — K219 Gastro-esophageal reflux disease without esophagitis: Secondary | ICD-10-CM

## 2020-12-24 DIAGNOSIS — R1010 Upper abdominal pain, unspecified: Secondary | ICD-10-CM

## 2020-12-24 DIAGNOSIS — Z9884 Bariatric surgery status: Secondary | ICD-10-CM | POA: Insufficient documentation

## 2020-12-24 MED ORDER — BARIUM SULFATE 96 % PO SUSR
380.0000 mL | Freq: Once | ORAL | Status: AC | PRN
Start: 2020-12-24 — End: 2020-12-24
  Administered 2020-12-24: 380 mL via ORAL

## 2020-12-27 ENCOUNTER — Other Ambulatory Visit (INDEPENDENT_AMBULATORY_CARE_PROVIDER_SITE_OTHER): Payer: Self-pay | Admitting: Internal Medicine

## 2020-12-27 ENCOUNTER — Other Ambulatory Visit (HOSPITAL_BASED_OUTPATIENT_CLINIC_OR_DEPARTMENT_OTHER): Payer: Self-pay | Admitting: Nurse Practitioner

## 2020-12-27 ENCOUNTER — Encounter (HOSPITAL_BASED_OUTPATIENT_CLINIC_OR_DEPARTMENT_OTHER): Payer: Self-pay | Admitting: Nurse Practitioner

## 2020-12-27 DIAGNOSIS — E782 Mixed hyperlipidemia: Secondary | ICD-10-CM

## 2020-12-27 DIAGNOSIS — Z9884 Bariatric surgery status: Secondary | ICD-10-CM

## 2020-12-27 DIAGNOSIS — K219 Gastro-esophageal reflux disease without esophagitis: Secondary | ICD-10-CM

## 2020-12-27 MED ORDER — SUCRALFATE 1 GM/10ML PO SUSP
1.0000 g | Freq: Four times a day (QID) | ORAL | 1 refills | Status: DC
Start: 2020-12-27 — End: 2021-01-07

## 2020-12-27 MED ORDER — SUCRALFATE 1 G PO TABS
1.0000 g | ORAL_TABLET | Freq: Four times a day (QID) | ORAL | 1 refills | Status: DC
Start: 2020-12-27 — End: 2021-01-07

## 2020-12-27 MED ORDER — FAMOTIDINE 40 MG PO TABS
40.0000 mg | ORAL_TABLET | Freq: Every evening | ORAL | 2 refills | Status: AC
Start: 2020-12-27 — End: 2021-03-27

## 2020-12-29 ENCOUNTER — Encounter (INDEPENDENT_AMBULATORY_CARE_PROVIDER_SITE_OTHER): Payer: Self-pay

## 2021-01-06 ENCOUNTER — Encounter (HOSPITAL_BASED_OUTPATIENT_CLINIC_OR_DEPARTMENT_OTHER): Payer: Self-pay | Admitting: Nurse Practitioner

## 2021-01-06 NOTE — Progress Notes (Signed)
Late entry: Communication with pt on 12/27/20  Plan:       1. Pepcid (Famotidine) 40 mg in the PM. It is OTC, but I will send in Rx as well to see if insurance will cover.  2. Carafate (sucralfate) 1 gram (10 ml) 4 times a day. Take sucralfate on an empty stomach, 2 hours after or 1 hour before meals.   3. Continue your Lansoprazole - take every morning on empty stomach at least 30 min before eating for best absorption.     Please let me know if this does not help.

## 2021-01-07 ENCOUNTER — Encounter (INDEPENDENT_AMBULATORY_CARE_PROVIDER_SITE_OTHER): Payer: Self-pay | Admitting: Internal Medicine

## 2021-01-07 ENCOUNTER — Ambulatory Visit (INDEPENDENT_AMBULATORY_CARE_PROVIDER_SITE_OTHER): Payer: Medicare Other | Admitting: Internal Medicine

## 2021-01-07 VITALS — BP 135/76 | HR 63 | Temp 98.3°F | Ht 67.0 in | Wt 205.0 lb

## 2021-01-07 DIAGNOSIS — Z0000 Encounter for general adult medical examination without abnormal findings: Secondary | ICD-10-CM

## 2021-01-07 DIAGNOSIS — Z1211 Encounter for screening for malignant neoplasm of colon: Secondary | ICD-10-CM

## 2021-01-07 DIAGNOSIS — E782 Mixed hyperlipidemia: Secondary | ICD-10-CM

## 2021-01-07 DIAGNOSIS — I10 Essential (primary) hypertension: Secondary | ICD-10-CM

## 2021-01-07 DIAGNOSIS — Z1212 Encounter for screening for malignant neoplasm of rectum: Secondary | ICD-10-CM

## 2021-01-07 DIAGNOSIS — E669 Obesity, unspecified: Secondary | ICD-10-CM

## 2021-01-07 DIAGNOSIS — Z9884 Bariatric surgery status: Secondary | ICD-10-CM

## 2021-01-07 DIAGNOSIS — Z1231 Encounter for screening mammogram for malignant neoplasm of breast: Secondary | ICD-10-CM

## 2021-01-07 DIAGNOSIS — E559 Vitamin D deficiency, unspecified: Secondary | ICD-10-CM

## 2021-01-07 NOTE — Progress Notes (Signed)
Have you seen any specialists/other providers since your last visit with us?    No    Arm preference verified?   Yes    The patient is due for shingles vaccine

## 2021-01-07 NOTE — Progress Notes (Signed)
[facility]            Barbara Rubio is a 70 y.o. female who presents today for the following Medicare Wellness Visit:  []  Initial Preventive Physical Exam (IPPE) - "Welcome to Medicare" preventive visit (Vision Screening required)   []  Annual Wellness Visit - Initial  [x]  Annual Wellness Visit - Subsequent     Health Risk Assessment:   During the past month, how would you rate your general health?:  (P) Good  Which of the following tasks can you do without assistance - drive or take the bus alone; shop for groceries or clothes; prepare your own meals; do your own housework/laundry; handle your own finances/pay bills; eat, bathe or get around your home?: (P) Drive or take the bus alone, Shop for groceries or clothes, Prepare your own meals, Do your own housework/laundry, Handle your own finances/pay bills, Eat, bathe, dress or get around your home  Which of the following problems have you been bothered by in the past month - dizzy when standing up; problems using the phone; feeling tired or fatigued; moderate or severe body pain?: (P) None of these  Do you exercise for about 20 minutes 3 or more days per week?:(P) Yes  During the past month was someone available to help if you needed and wanted help?  For example, if you felt nervous, lonely, got sick and had to stay in bed, needed someone to talk to, needed help with daily chores or needed help just taking care of yourself.: (P) Yes  Do you always wear a seat belt?: (P) Yes  Do you have any trouble taking medications the way you have been told to take them?: (P) No  Have you been given any information that can help you with keeping track of your medications?: (P) Yes  Do you have trouble paying for your medications?: (P) No  Have you been given any information that can help you with hazards in your house, such as scatter rugs, furniture, etc?: (P) No  Do you feel unsteady when standing or walking?: (P) No  Do you  worry about falling?: (P) No  Have you fallen two or more times in the past year?: (P) No  Did you suffer any injuries from your falls in the past year?: (P) No     Care Team:   Patient Care Team:  Morrison Old, MD as PCP - General (Internal Medicine)      Hospitalizations:   Hospitalization within past year: [x]  No  []  Yes     Diagnosis:      Screenings:   Ambulatory Screenings 12/17/2020 12/17/2020 12/31/2020   Falls Risk: De [place] more than 2 times in past year N N N   Falls Risk: Suffer any injuries? N N N   Depression: PHQ2 Total Score - - 0   Depression: PHQ9 Total Score - - 0        Substance Use Disorder Screen:  In the past year, how often have you used the following?  1) Alcohol (For men, 5 or more drinks a day. For women, 4 or more drinks a day)  [x]  Never []  Once or Twice []  Monthly []  Weekly []  Daily or Almost Daily  2) Tobacco Products  [x]  Never []  Once or Twice []  Monthly []  Weekly []  Daily or Almost Daily  3) Prescription Drugs for Non-Medical Reasons  [x]  Never []  Once or Twice []  Monthly []  Weekly []  Daily or Almost  Daily  4) Illegal Drugs  [x]  Never []  Once or Twice []  Monthly []  Weekly []  Daily or Almost Daily             Functional Ability/Level of Safety:   Falls Risk/Home Safety Assessment:  ( see HRA and Screenings sections for additional assessment)  Home Safety: []  Stair handrails  []  Skid-resistant rugs/remove throw rugs   []  Grab bars  []  Clear pathways between rooms  []  Proper lighting stairs/ bathrooms/bedrooms  Get Up and Go (optional):  []   <20 secs  []   >20 secs    []   High risk for falls - Home Safety/Falls Risk Precautions reviewed with pt/family    Hearing Assessment:  Concerns for hearing loss: []  Yes  [x]   No  Hearing aids:   []   Right  []   Left  []   Bilateral   []   None  Whisper Test (optional):  [x]  Normal  []   Slightly decreased  []   Significantly decreased    Exercise:  Frequency:  []   No formal exercise  []   1-2x/wk  [x]   3-4x/wk  []   >4x/wk  Duration:  [x]   15-30 mins/day  []    30-45 mins/day  []   45+ mins/day  Intensity:  [x]   Light  []   Moderate  []   Heavy        Activities of Daily Living:   ADL's Independent Minimal  Assistance Moderate  Assistance Total   Assistance   Bathing [x]  []  []  []    Dressing [x]  []  []  []    Mobility   [x]  []  []  []    Transfer [x]  []  []  []    Eating [x]  []  []  []    Toileting [x]  []  []  []      IADL's Independent Minimal  Assistance Moderate  Assistance Total   Assistance   Phone [x]  []  []  []    Housekeeping [x]  []  []  []    Laundry [x]  []  []  []    Transportation [x]  []  []  []    Medications [x]  []  []  []    Finances [x]  []  []  []       ADL assistance: [x]  No assistance needed  []  Spouse  []  Sibling  []  Son   []  Daughter []  Children  []  Home Health Aide []  Other:       Advance Care Planning:   Discussion of Advance Directives:   []  Advance Directive in chart  [x]  Advance Directive not in chart - requested to provide []  No Advance Directive.  Form Provided  []  No Advance Directive.  Pt declines. []  Not addressed today  []  Other:     Exam:   BP 135/76    Pulse 63    Temp 98.3 F (36.8 C)    Ht 1.702 m (5\' 7" )    Wt 93 kg (205 lb)    SpO2 97%    BMI 32.11 kg/m      Physical Exam   GENERAL APPEARANCE: alert, in no acute distress, well developed, well nourished      oriented to time, place, and person.   EYES: PERRL, EOMI  NECK/THYROID: neck supple, no carotid bruit, carotid pulse 2+ bilaterally, no neck mass palpated, no jugular venous distention, no thyromegaly.  LYMPH: no cervical/supraclavicular adenopathy   HEART: S1, S2 normal, no murmurs, rubs, gallops, regular rate and rhythm.   LUNGS: normal effort / no distress, normal breath sounds, clear to auscultation    bilaterally, no wheezes, rales, rhonchi.   EXTREMITIES: No LE edema bilaterally.   PERIPHERAL PULSES:  peripheral  pulses normal, no pedal edema, no clubbing or cyanosis  PSYCH: alert and oriented to time,place and person.   SKIN: moist, no focal rash  NEURO: no focal deficit                Evaluation of Cognitive  Function:   Mood/affect: [x]  Appropriate  []   Other:   Appearance: [x]  Neatly groomed  [x]  Adequately nourished  []  Other:  Family member/caregiver input: []  Present - no concerns  []   Not present in room  []  Present - concerns:    Cognitive Assessment:  Mini-Cog Result (three word registration- banana, sunrise, chair / clock drawing):   [x]   > 3 points - negative screen for dementia   []  3 recalled words - negative screen for dementia   []  1-2 recalled words and normal clock draw - negative for cognitive impairment   []  1-2 recalled words and abnormal clock draw - positive for cognitive impairment   []  0 recalled words - positive for cognitive impairment         Assessment/Plan:   1. Routine general medical examination at a health care facility  Recommend annual flu vaccine, Tdap every 10 years,  sunscreen, seatbelts, no texting while driving.  Health promotion and disease prevention discussed. Exercise and diet discussed. Optimize aerobic exercise per ACC guidelines 150 min per week. Aim for 2-3 L of water per day. Dietary fiber 25 grams per day. Optimize weight. Daily calcium and vitamin D.      2. Essential hypertension  Pt stable on current medication regimen, no concerns. Continue current medication    3. Mixed hyperlipidemia  Pt stable on current medication regimen, no concerns. Continue current medication    4. S/P laparoscopic sleeve gastrectomy  Following with the bariatric surgeon regular  Continue small frequent meals  Continue exercise  5. Vitamin D deficiency  Continue vitamin D supplement  6. Obesity (BMI 30-39.9)  HIgh BMI Follow Up   BMI Follow Up Care Plan Documented   Encouragement to Exercise    7. Encounter for screening mammogram for malignant neoplasm of breast  - Mammo Digital Screening Bilateral W Cad; Future    8. Screening for colorectal cancer  - Ambulatory referral to Gastroenterology      Education/Counseling:  During the course of the visit, the patient was educated and counseled  about appropriate screening and preventive services including:   Pneumococcal vaccine  Influenza vaccine  Shingrix vaccine  COVID vaccine  Screening mammography  Bone densitometry screening  Colorectal cancer screening  Diabetes screening  Glaucoma screening  Nutrition counseling  Physical activity/exercise counseling  Heart healthy diet counseling  Falls risk prevention and home safety      Falls Risk/Home Safety:  The following falls risk/home safety measures were discussed with the patient:  [x]  Home Environment - 1) clear floors, walkways, and stairs of items 2) Remove throw rugs or use non-skid rugs 3) Install grab bars in bathroom 4) Use of handrails with stairs 5) Proper lighting of hallways, bathroom, stairs, bedroom  [x]  Physical Activity - age appropriate activity/exercise 150 mins per week.  [x]  Nutrition - adequate hydration, limit soda, coffee, alcohol   [x]  Vision - yearly eye exam, keep glasses clean  [x]  Medications - side effect education           Morrison Old, MD    01/07/2021     The following sections were reviewed this encounter by the provider:   Tobacco   Allergies   Meds  Problems   Med Hx   Surg Hx   Fam Hx             History:   Patient Active Problem List   Diagnosis    Essential hypertension    Mixed hyperlipidemia    Pain in both hands    Gastroesophageal reflux disease    Hiatal hernia    Vitamin D deficiency    S/P laparoscopic sleeve gastrectomy    Obesity (BMI 30-39.9)      Past Medical History:   Diagnosis Date    Gastroesophageal reflux disease 10/23/2013    Acid reflux alot after meals. TUMS, PRILOSEC     Hyperlipidemia     Hypertension     well controlled    Left wrist fracture 2018    Psoriatic arthritis     LOWER BACK, LEFT HAND AND FINGERS     Urinary incontinence 12/21/2017    Over active bladder. TOVIAZ HELPS      Past Surgical History:   Procedure Laterality Date    BUNIONECTOMY Left     COLONOSCOPY      X 2     EGD N/A 05/18/2020    Procedure: EGD w/ bx's;   Surgeon: Sabas Sous, MD;  Location: [facility];  Service: Gastroenterology;  Laterality: N/A;    HYSTERECTOMY       partial    LAPAROSCOPIC GASTRECTOMY SLEEVE WITH HIATAL HERNIA N/A 07/28/2020    Procedure: LAPAROSCOPIC GASTRECTOMY SLEEVE WITH HIATAL HERNIA, OMENTOPEXY, LAPAROSCOPIC RESECTION OF ANTRAL AND FUNDIC GASTRIC MASS;  Surgeon: Nicola Police, DO;  Location: [facility];  Service: General;  Laterality: N/A;     No Known Allergies   Outpatient Medications Marked as Taking for the 01/07/21 encounter (Office Visit) with Morrison Old, MD   Medication Sig Dispense Refill    atorvastatin (LIPITOR) 10 MG tablet TAKE 1 TABLET BY MOUTH EVERY DAY AT NIGHT 90 tablet 1    estradiol (ESTRACE) 0.1 MG/GM vaginal cream TWICE A WEEK        famotidine (PEPCID) 40 MG tablet Take 1 tablet (40 mg total) by mouth every evening 30 tablet 2    lansoprazole (PREVACID) 30 MG capsule Take 1 capsule (30 mg total) by mouth daily 90 capsule 0    lisinopril (ZESTRIL) 5 MG tablet Take 1 tablet (5 mg total) by mouth daily 90 tablet 3    [DISCONTINUED] Fesoterodine Fumarate (Toviaz) 4 MG Tablet SR 24 hr 2 (two) times daily         [DISCONTINUED] sucralfate (CARAFATE) 1 GM/10ML suspension Take 10 mLs (1 g total) by mouth 4 (four) times daily 1200 mL 1    [DISCONTINUED] ursodiol (ACTIGALL) 300 MG capsule Take 1 capsule (300 mg total) by mouth 2 (two) times daily Start 2 weeks after surgery 180 capsule 1     Social History     Tobacco Use    Smoking status: Never Smoker    Smokeless tobacco: Never Used    Tobacco comment: Never smoked or used tobacco   Vaping Use    Vaping Use: Never used   Substance Use Topics    Alcohol use: Never     Comment:  once a month     Drug use: Never      Family History   Problem Relation Age of Onset    Hypertension Mother     Hypertension Father     Hypertension Sister     Hypertension Brother  Migraines Neg Hx     Stroke Neg Hx     Seizures Neg Hx             ===================================================================    Additional Documentation:

## 2021-01-07 NOTE — Patient Instructions (Signed)
MEDICARE WELLNESS PERSONAL PREVENTION PLAN   As part of the Medicare Wellness portion of your visit today, we are providing you with this personalized preventative plan of care. We have listed below some of the preventative services that are recommended for patients based upon their age and gender. These recommendations are taken directly from the [place] [employer] (USPSTF) and the [employer] (ACIP).     Health Maintenance   Topic Date Due    Shingrix Vaccine 50+ (1) Never done    COVID-19 Vaccine (3 - Booster for [employer] series) 06/13/2020    FALLS RISK ANNUAL  12/31/2021    DEPRESSION SCREENING  12/31/2021    Medicare Annual Wellness Visit  01/07/2022    MAMMOGRAM  01/20/2022    Colorectal Cancer Screening  10/23/2022    HEPATITIS C SCREENING  Completed    INFLUENZA VACCINE  Completed    DXA Scan  Completed    Pneumonia Vaccine Age 7+  Completed       Health Maintenance Topics with due status: Overdue       Topic Date Due    Shingrix Vaccine 50+ Never done    COVID-19 Vaccine 06/13/2020        Immunization History   Administered Date(s) Administered    COVID-19 mRNA vaccine 12 years and above [employer]) 30 mcg/0.3 mL 11/21/2019, 11/21/2019, 12/15/2019, 12/15/2019    INFLUENZA HIGH DOSE 65 YRS+ 10/26/2017, 07/30/2018, 06/22/2020    INFLUENZA QUADRIVALENT ADJUVANTED PF 65 YRS & OLDER (FLUAD) 07/25/2019    Pneumococcal 23 valent 07/30/2018    Pneumococcal Conjugate 13-Valent 10/29/2017, 05/31/2018    Tdap 07/30/2018        Your major risk factors:   Recommendations for improvement:      The list below includes many common screening recommendations but is not meant to be comprehensive. You may be eligible for other preventative services depending upon your personal risk factors.   Colorectal Cancer Screening - All adults age 34-75 should undergo periodic colorectal cancer screening. The decision to screen for colorectal cancer in adults aged 70 to  15 years should be an individual one,taking into account your overall health and prior screening history.   Breast Cancer Screening - Women age 67-74 should have mammograms every other year (please note that this recommendation may not be appropriate for every woman - your physician can answer specific questions you may have). The USPSTF concludes that the current evidence is insufficient to assess the balance of benefits and harms of screening mammography in women aged 78 years or older.    Cervical Cancer Screening - Women over 48 do not require pap smears as long as prior screening has been normal and are not otherwise at high risk for cervical cancer. For women aged 59 to 78 years, the USPSTF recommends screening every 3 years with cervical cytology alone, every 5 years with high-risk human papillomavirus (hrHPV) testing alone, or every 5 years with hrHPV testing in combination with cytology (cotesting).   Osteoporosis Screening -  The USPSTF recommends screening for osteoporosis with bone measurement testing to prevent osteoporotic fractures in women 65 years and older.  Hepatitis C Screening - Recommend screening for hepatitis C virus (HCV) infection in all adults aged 59 to 18 years.  Lung cancer Screening - Recommend annual screening for lung cancer with low-dose computed tomography (LDCT) in adults ages 14 to 11 years who have a 20 pack-year smoking history and currently smoke or have  quit within the past 15 years.  Recommended Vaccinations   Influenza one dose annually   Tetanus/diphtheria one booster every 10 years   Zoster/Shingles - Shingrix two doses after age 37 (second dose given 2-6 months after first dose)  Pneumovax (PPSV23) one dose for adults aged ?65 years  Prevnar(PCV13) shared clinical decision-making is recommended regarding administration of this vaccine to persons aged ?65 years who do not have an immunocompromising condition, cerebrospinal fluid leak, or cochlear implant.     PERSONAL  PREVENTION PLAN   Your Personal Prevention Plan is based on your overall health and your responses to the health questionnaire you completed. The following information is for you to review in addition to the recommendations, referrals, and tests we have discussed at your visit.     Physical Activity:   Physical activity can help you maintain a healthy weight, prevent or control illness, reduce stress, and sleep better. It can also help you improve your balance to avoid falls. Try to build up to and maintain a total of 30 minutes of activity each day. If you are able, try walking, doing yard or housework, and taking the stairs more often. You can also strengthen your muscles with exercises done while sitting or lying down.   Emotional Health:   Feeling down in the dumps or anxious every now and then is a natural part of life. If this feeling lasts for a few weeks or more, talk with me as soon as possible. It could be a sign of a problem that needs treatment. There are many types of treatment available.   Falls:   You can reduce your risk of falling by making changes in your home. Remove items that may cause tripping, improve lighting, and consider installing grab bars.   Talk with me if you have problems with balance and walking. To prevent falls, you may need your vision, hearing, or blood pressure checked. Exercises to improve your strength and balance, or using a cane or walker, may help. Review your medicines with me at every visit, because some can affect balance. Please be sure to let me know if you fall or are fearful you may fall.   Urinary Leakage:   Urine leakage is common, but it is not a normal part of aging. Talk with me about any urine leakage so that the cause can be found and treated. Treatment can include bladder training, exercises, medicine or surgery.   Pain:   We all have aches and pains at times, but chronic pain can change how you feel and live every day. Please talk with me about any  symptoms of chronic pain so that we can determine how best to treat.   Sleep:   Getting a good nights sleep is vital to your health and well-being and can help prevent or manage health problems. Often, sleep can be improved by changing behaviors, including when you go to bed and what you do before bed. Sleep apnea can cause problems such as struggling to stay awake during the day. Please let me know if you would like to learn more about improving your sleep and/or think you may have sleep apnea.   Seat Belt:   Please remember to wear a seat belt when driving or riding in a vehicle. It is one of the most important things you can do to stay safe in a car.   Nutrition:   Remember to eat plenty of fruits, vegetables, whole grains, and dairy. Drink at least  64 ounces (8 full glasses) of water a day, unless you have been advised to limit fluids.   Alcohol:   Alcohol can have a greater effect on older people, who may feel its effects at a lower amount. Older people should limit alcoholic drinks (no more than one a day for women and no more than two a day for men). Please let me know if alcohol use becomes a problem.   Tobacco:   Not smoking or using other forms of tobacco is one of the most important things you can do for your health. Here is some more information about the importance of quitting smoking and how to quit smoking - BroadJournal.com.pt  Advance Directives:   There may come a time when medical decisions need to be made on your behalf. Please talk with your family, and with me, about your wishes. It is important to provide information about your decisions, and any formal advance directives, for your medical record. Here is additional information on advanced directives - [email or web address]  Additional Support:   Sometimes it can be challenging to manage all aspects of daily life. Finding the right support can help you maintain or improve your health and  independence. Please let me know if you would like to talk further about finding resources to assist you.

## 2021-01-24 ENCOUNTER — Other Ambulatory Visit: Payer: Self-pay | Admitting: Internal Medicine

## 2021-01-25 ENCOUNTER — Other Ambulatory Visit (INDEPENDENT_AMBULATORY_CARE_PROVIDER_SITE_OTHER): Payer: Self-pay | Admitting: Internal Medicine

## 2021-01-25 DIAGNOSIS — Z1231 Encounter for screening mammogram for malignant neoplasm of breast: Secondary | ICD-10-CM

## 2021-01-28 ENCOUNTER — Encounter (INDEPENDENT_AMBULATORY_CARE_PROVIDER_SITE_OTHER): Payer: Self-pay

## 2021-01-29 ENCOUNTER — Encounter (INDEPENDENT_AMBULATORY_CARE_PROVIDER_SITE_OTHER): Payer: Self-pay

## 2021-02-27 ENCOUNTER — Encounter (INDEPENDENT_AMBULATORY_CARE_PROVIDER_SITE_OTHER): Payer: Self-pay

## 2021-02-28 ENCOUNTER — Encounter (INDEPENDENT_AMBULATORY_CARE_PROVIDER_SITE_OTHER): Payer: Self-pay

## 2021-03-07 ENCOUNTER — Other Ambulatory Visit (INDEPENDENT_AMBULATORY_CARE_PROVIDER_SITE_OTHER): Payer: Self-pay

## 2021-03-07 ENCOUNTER — Telehealth (INDEPENDENT_AMBULATORY_CARE_PROVIDER_SITE_OTHER): Payer: Self-pay

## 2021-03-07 DIAGNOSIS — Z1211 Encounter for screening for malignant neoplasm of colon: Secondary | ICD-10-CM

## 2021-03-07 MED ORDER — PEG 3350-KCL-NABCB-NACL-NASULF 240 G PO SOLR
4000.0000 mL | Freq: Once | ORAL | 0 refills | Status: AC
Start: 2021-03-07 — End: 2021-03-07

## 2021-03-07 NOTE — Telephone Encounter (Signed)
1st attempt made on 03/07/21 at 1:20 pm.  Direct booked the patient for a CLN. Appt for 06/09 will be cancelled.  Will mail a copy of instructions and directs. Patient stated she gets kicked out of my cghart every time she logs in. Verified address on file.

## 2021-03-22 ENCOUNTER — Telehealth: Payer: Medicare Other

## 2021-03-25 ENCOUNTER — Ambulatory Visit: Payer: Medicare Other

## 2021-03-25 NOTE — Pre-Procedure Instructions (Signed)
Important Instructions Before Your Procedure        Your case is currently scheduled for 04/13/2021 at 1330 with Franki Monte, MD.  Please direct any questions regarding your bowel prep to Dr. Merry Proud office.    The date and/or time of your surgery may change.  Your surgeon's office will notify you, up until the business day before surgery, if there is any change to your surgery date or time.  Please don't hesitate to call your surgeon's office directly with any questions.    Your procedure is scheduled at [facility] located in the [facility] Services Building.  On the following map, the [facility] lab is #7: [email or web address].  Once inside the Professional Services Building, please take the [facility] elevators to the 1st floor. Please arrive 1 hour early to your scheduled procedure time.  Please park in the [facility]. Kindly call [phone] if you are lost or running late on the day of procedure.        Please call Pre-Surgical Services at [phone] with any questions regarding our phone interview today.  You may also find this website helpful: [email or web address]     IMPORTANT You must visit the Preparing for Your Procedure guide link below for additional instructions including fasting guidelines and directions before your procedure. If you received fasting or skin preparation instructions from your surgeon or pre-procedural provider, please follow those specific instructions. The instructions here are general instructions that do not pertain to all patients.  [email or web address]    If the link doesn't open, please copy and paste in your browser

## 2021-03-25 NOTE — PSS Phone Screening (Signed)
Pre-Anesthesia Evaluation    Pre-op phone visit requested by:   Reason for pre-op phone visit: Patient anticipating COLONOSCOPY procedure.    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 Abnormal vision     wears glasses   . Gastroesophageal reflux disease 10/23/2013    Acid reflux a lot after meals. TUMS, PRILOSEC   . Hiatal hernia     repaired with surgery   . Hyperlipidemia    . Hypertension     checks BP at home: 128-130/70s   . Left wrist fracture 2018   . Psoriatic arthritis     LOWER BACK, LEFT HAND, WRIST AND FINGERS   . Urinary incontinence 12/21/2017    Over active bladder-history     Past Surgical History:   Procedure Laterality Date   . BUNIONECTOMY Left    . COLONOSCOPY      X 2    . EGD N/A 05/18/2020    Procedure: EGD w/ bx's;  Surgeon: Sabas Sous, MD;  Location: [facility];  Service: Gastroenterology;  Laterality: N/A;   . EGD  12/2020   . HYSTERECTOMY       partial   . LAPAROSCOPIC GASTRECTOMY SLEEVE WITH HIATAL HERNIA N/A 07/28/2020    Procedure: LAPAROSCOPIC GASTRECTOMY SLEEVE WITH HIATAL HERNIA, OMENTOPEXY, LAPAROSCOPIC RESECTION OF ANTRAL AND FUNDIC GASTRIC MASS;  Surgeon: Jeanell Sparrow, Hamid R, DO;  Location: [facility];  Service: General;  Laterality: N/A;       Current Outpatient Medications:   .  atorvastatin (LIPITOR) 10 MG tablet, TAKE 1 TABLET BY MOUTH EVERY DAY AT NIGHT, Disp: 90 tablet, Rfl: 1  .  estradiol (ESTRACE) 0.1 MG/GM vaginal cream, TWICE A WEEK , Disp: , Rfl:   .  famotidine (PEPCID) 40 MG tablet, Take 1 tablet (40 mg total) by mouth every evening, Disp: 30 tablet, Rfl: 2  .  lisinopril (ZESTRIL) 5 MG tablet, Take 1 tablet (5 mg total) by mouth daily (Patient taking differently: Take 5 mg by mouth nightly), Disp: 90 tablet, Rfl: 3  .  B Complex Vitamins (B-Complex/B-12) Tab, Take 1 tablet by mouth daily Takes liquid, Disp: , Rfl:   .  VITAMIN D PO, Take 5,000 IU by mouth daily, Disp: , Rfl:      Medication List  Accurate as of March 25, 2021 10:10 AM. Always use your most recent med list.            atorvastatin 10 MG tablet  TAKE 1 TABLET BY MOUTH EVERY DAY AT NIGHT  Commonly known as: LIPITOR  Notes to patient: Take evening before surgery     B-Complex/B-12 Tabs  Take 1 tablet by mouth daily Takes liquid  Notes to patient: Patient is currently not taking this medication     estradiol 0.1 MG/GM vaginal cream  TWICE A WEEK  Commonly known as: ESTRACE  Medication Adjustments for Surgery: Stop 7 days before surgery     famotidine 40 MG tablet  Take 1 tablet (40 mg total) by mouth every evening  Commonly known as: PEPCID  Notes to patient: Take evening before surgery     lisinopril 5 MG tablet  Take 1 tablet (5 mg total) by mouth daily  Commonly known as: ZESTRIL  Notes to patient: Take evening before surgery     VITAMIN D PO  Take 5,000 IU by mouth daily  Notes to patient: Patient is currently not taking this medication          No Known Allergies  Family History   Problem Relation Age of Onset   . Hypertension Mother    . Hypertension Father    . Hypertension Sister    . Hypertension Brother    . Migraines Neg Hx    . Stroke Neg Hx    . Seizures Neg Hx      Social History     Occupational History    . Not on file   Tobacco Use   . Smoking status: Never Smoker   . Smokeless tobacco: Never Used   . Tobacco comment: Never smoked or used tobacco   Vaping Use   . Vaping Use: Never used   Substance and Sexual Activity   . Alcohol use: Yes     Comment: once-twice a month   . Drug use: Never   . Sexual activity: Not on file       Menstrual History:   LMP / Status  Hysterectomy     No LMP recorded. Patient has had a hysterectomy.    Tubal Ligation?  No valid surgical or medical questions entered.             Exam Scores:   SDB score Risk Category: No Risk    PONV score Nausea Risk: SEVERE RISK    MST score MST Score: 0    Allergy score Risk Category: Low Risk    Frailty score CFS Score: 2       Visit Vitals  Ht 1.727 m (5\' 8" )   Wt 91.2 kg (201 lb)   BMI 30.56 kg/m       Recent Labs   CBC (last 180 days) 11/12/20  1007   WBC 4.46   Hgb 12.4   Hematocrit 41.2   Platelets 153     Recent Labs   BMP (last 180 days) 11/12/20  1007   Sodium 141   Potassium 4.6   Chloride 107   CO2 28   BUN 8.0   Glucose 89     Recent Labs   ABG (last 180 days) 11/12/20  1007   Hgb 12.4         Recent Labs   Other (last 180 days) 11/12/20  1007   TSH 1.45   ALT 16   AST (SGOT) 20  Protein, Total 6.8   Iron 120

## 2021-03-30 ENCOUNTER — Encounter (INDEPENDENT_AMBULATORY_CARE_PROVIDER_SITE_OTHER): Payer: Self-pay

## 2021-03-31 ENCOUNTER — Ambulatory Visit (INDEPENDENT_AMBULATORY_CARE_PROVIDER_SITE_OTHER): Payer: Medicare Other | Admitting: Internal Medicine

## 2021-03-31 ENCOUNTER — Encounter (INDEPENDENT_AMBULATORY_CARE_PROVIDER_SITE_OTHER): Payer: Self-pay

## 2021-04-13 ENCOUNTER — Ambulatory Visit: Payer: Medicare Other | Admitting: Anesthesiology

## 2021-04-13 ENCOUNTER — Encounter
Admission: RE | Disposition: A | Discharge status: Home / Self Care | Payer: Self-pay | Source: Ambulatory Visit | Attending: Internal Medicine

## 2021-04-13 ENCOUNTER — Encounter: Payer: Self-pay | Admitting: Internal Medicine

## 2021-04-13 ENCOUNTER — Ambulatory Visit
Admission: RE | Admit: 2021-04-13 | Discharge: 2021-04-13 | Disposition: A | Discharge status: Home / Self Care | Payer: Medicare Other | Source: Ambulatory Visit | Attending: Internal Medicine | Admitting: Internal Medicine

## 2021-04-13 DIAGNOSIS — K648 Other hemorrhoids: Secondary | ICD-10-CM | POA: Insufficient documentation

## 2021-04-13 DIAGNOSIS — Z79899 Other long term (current) drug therapy: Secondary | ICD-10-CM | POA: Insufficient documentation

## 2021-04-13 DIAGNOSIS — Z1211 Encounter for screening for malignant neoplasm of colon: Secondary | ICD-10-CM | POA: Insufficient documentation

## 2021-04-13 DIAGNOSIS — Z86010 Personal history of colonic polyps: Secondary | ICD-10-CM | POA: Insufficient documentation

## 2021-04-13 DIAGNOSIS — D120 Benign neoplasm of cecum: Secondary | ICD-10-CM | POA: Insufficient documentation

## 2021-04-13 DIAGNOSIS — D123 Benign neoplasm of transverse colon: Secondary | ICD-10-CM | POA: Insufficient documentation

## 2021-04-13 DIAGNOSIS — K640 First degree hemorrhoids: Secondary | ICD-10-CM | POA: Insufficient documentation

## 2021-04-13 DIAGNOSIS — Z09 Encounter for follow-up examination after completed treatment for conditions other than malignant neoplasm: Secondary | ICD-10-CM | POA: Insufficient documentation

## 2021-04-13 HISTORY — PX: COLONOSCOPY, DIAGNOSTIC (SCREENING): SHX174

## 2021-04-13 SURGERY — DONT USE, USE 1094-COLONOSCOPY, DIAGNOSTIC (SCREENING)
Anesthesia: Anesthesia General | Site: Anus

## 2021-04-13 MED ORDER — PROPOFOL INFUSION 10 MG/ML
INTRAVENOUS | Status: DC | PRN
Start: 2021-04-13 — End: 2021-04-13
  Administered 2021-04-13: 150 ug/kg/min via INTRAVENOUS

## 2021-04-13 MED ORDER — PROPOFOL 10 MG/ML IV EMUL (WRAP)
INTRAVENOUS | Status: DC | PRN
Start: 2021-04-13 — End: 2021-04-13
  Administered 2021-04-13: 120 mg via INTRAVENOUS

## 2021-04-13 MED ORDER — LACTATED RINGERS IV SOLN
INTRAVENOUS | Status: DC
Start: 2021-04-13 — End: 2021-04-13

## 2021-04-13 MED ORDER — PROPOFOL 10 MG/ML IV EMUL (WRAP)
INTRAVENOUS | Status: AC
Start: 2021-04-13 — End: ?
  Filled 2021-04-13: qty 50

## 2021-04-13 SURGICAL SUPPLY — 56 items
AMPOULE ELEVIEW 10ML (Endoscopic Supplies)
CANISTER 1000CC (Procedure Accessories) ×1
CANISTER WOUND DRAINAGE GEL INFOVAC 1000 (Procedure Accessories) ×1
CANISTER WOUND DRAINAGE GEL INFOVAC 1000 ML VAC ULTA THERAPY SYSTEM (Procedure Accessories) ×1 IMPLANT
CONNECTOR HYDRA WATER JET (Connector) ×1
CONNECTOR IRRIGATION AUXILIARY WATER JET (Connector) ×1
CONNECTOR IRRIGATION AUXILIARY WATER JET 1 WAY VALVE HYDRA (Connector) ×1 IMPLANT
CONTAINER HISTOLOGY 60 ML 30 ML GRADUATE LEAK RESISTANT O RING PREFILL (Procedure Accessories) IMPLANT
ELECTRODE ADULT PATIENT RETURN L9 FT REM POLYHESIVE ACRYLIC FOAM (Procedure Accessories) IMPLANT
ELECTRODE PATIENT RETURN L9 FT VALLEYLAB (Procedure Accessories)
ENDOCUFF VISION LARGE GREEN (Procedure Accessories)
FORCEPS BIOPSY L240 CM STANDARD CAPACITY (Disposable Instruments)
FORCEPS BIOPSY L240 CM STANDARD CAPACITY NEEDLE OD2.2 MM RADIAL JAW (Disposable Instruments) IMPLANT
FORCEPS BX RADIAL JAW 4 2.8MM (Disposable Instruments)
GEL ORISE SUBMUCOSAL (Endoscopic Supplies)
GLOVE EXAM LARGE NITRILE CHEMOTHERAPY POWDER FREE SENSE OATMEAL (Glove) ×1 IMPLANT
GLOVE EXAM LARGE NITRILE POWDER FREE SENSE OATMEAL (Glove) ×1 IMPLANT
GLOVE EXAM NITRILE RESTORE LG (Glove) ×1
GLV EXAM NITRILE RESTORE LG (Glove) ×2
GOWN CP ELSTC WRIST REG/LG BL (Gown) ×1
GOWN ISL PP PE REG LG LF FULL BCK NK TIE (Gown) ×1
GOWN ISOLATION REGULAR LARGE FULL BACK NECK TIE ELASTIC CUFF (Gown) ×1 IMPLANT
KIT ENDO W/ ORCA AND SEAL ONLY (Kits) ×1
KIT ENDOSCOPIC COMPLIANCE ENDOKIT (Kits) ×1
KIT ENDOSCOPIC COMPLIANCE ENDOKIT ORCAPOD 3 1.1 OZ (Kits) ×1 IMPLANT
LIFTER SRG 10ML ELEVIEW 5X5.1X1.2IN INJ (Endoscopic Supplies) IMPLANT
LIFTER SURGICAL 23 G SYRINGE TWIN PACK (Endoscopic Supplies)
LIFTER SURGICAL 23 G SYRINGE TWIN PACK ORISE GEL 10 ML CLEAR (Endoscopic Supplies) IMPLANT
LIFTER SURGICAL 23 G SYRINGE TWIN PACK ORISEâ„¢ GEL 10 ML CLEAR (Endoscopic Supplies) IMPLANT
MARKER ENDOSCOPIC PERMANENT INDICATION (Syringes, Needles)
MARKER ENDOSCOPIC PERMANENT INDICATION DARK SYRINGE SPOT EX 5 ML (Syringes, Needles) IMPLANT
PAD ELECTROSRG GRND REM W CRD (Procedure Accessories)
SINGLE USE ELECTROSURGICAL SNARE (Endoscopic Supplies) ×1
SNARE CAPTIVATOR 13MMX240CM (GE Lab Supplies)
SNARE CAPTVATR COLD 10MM LOOP (Procedure Accessories)
SNARE ESCP MIC CPTVTR 13MM 240IN STRL (GE Lab Supplies)
SNARE ESCP RND CPTVTR 2.4MM 240CM CLD (Procedure Accessories)
SNARE HEXAGON L15 MM SNAREMASTER THIN (Endoscopic Supplies) ×1
SNARE HEXAGON L15 MM SNAREMASTER THIN WIRE HYBRID HOT COLD ENDOSCOPIC (Endoscopic Supplies) IMPLANT
SNARE OD10 MM CAPTIVATOR ENDOSCOPIC POLYPECTOMY (Procedure Accessories) IMPLANT
SNARE SMALL HEXAGON CAPTIVATOR STIFF ENDOSCOPIC POLYPECTOMY (GE Lab Supplies) IMPLANT
SOL FORMALIN 10% PREFILL 30ML (Procedure Accessories) ×4
STERILE WATER 1000ML (Solution) ×1
SYRING SPOT EX ENDO MARK 5CC (Syringes, Needles)
SYRINGE 50 ML GRADUATE NONPYROGENIC DEHP (Syringes, Needles) ×1
SYRINGE 50 ML GRADUATE NONPYROGENIC DEHP FREE PVC FREE BD MEDICAL (Syringes, Needles) ×1 IMPLANT
SYRINGE SLIP-TIP 60CC (Syringes, Needles) ×1
TIP COLONOSCOPE LARGE FLEXIBLE ARM ID11.2 MM ENDOCUFF VISION GREEN (Procedure Accessories) IMPLANT
TIP CSCP LG ENDOCUFF VSN 11.2MM LF STRL (Procedure Accessories)
TRAP 1 CHAMBER ENDO SUCTION QUICK CATCH (Endoscopic Supplies)
TRAP 1 CHAMBER ENDO SUCTION QUICK CATCH WIDE-EYE SPECIMEN POLYP (Endoscopic Supplies) IMPLANT
TRAP POLYP SUCTION WIDE EYE (Endoscopic Supplies)
TUBING SUCTION OD3/16 IN L12 FT FEMALE (Tubing) ×1
TUBING SUCTION OD3/16 IN L12 FT FEMALE CONNECTOR RIBBED UNIVERSAL (Tubing) ×1 IMPLANT
TUBING SUCTN UNIV 3/16INX12FT (Tubing) ×1
WATER STERILE PVC FREE DEHP FREE 1000 ML (Solution) ×1 IMPLANT

## 2021-04-13 NOTE — Anesthesia Preprocedure Evaluation (Addendum)
Anesthesia Evaluation    AIRWAY    Mallampati: II    TM distance: >3 FB  Neck ROM: full  Mouth Opening:full   CARDIOVASCULAR    cardiovascular exam normal       DENTAL           PULMONARY    pulmonary exam normal     OTHER FINDINGS    S/p gastric sleeve  HTN  gerd            Relevant Problems   CARDIO   (+) Essential hypertension      GI   (+) Gastroesophageal reflux disease   (+) Hiatal hernia               Anesthesia Plan    ASA 2     general                     Detailed anesthesia plan: general IV            informed consent obtained                   Signed by: Henreitta Leber, MD 04/13/21 8:47 AM

## 2021-04-13 NOTE — H&P (Signed)
PRE-PROCEDURE CONSULT NOTE  [address] [place], [place] [address]  Appointments: [phone]          70 year old female here referred for evaluation of colonoscopy. She denies any nausea, vomiting, anorexia, gross gastrointestinal bleeding, weight loss, constipation, diarrhea, jaundice. Last colonoscopy > 5 years ago, had colon polyps on index colonoscopy.      Past Medical History:   Diagnosis Date    Abnormal vision     wears glasses    Gastroesophageal reflux disease 10/23/2013    Acid reflux a lot after meals. TUMS, PRILOSEC    Hiatal hernia     repaired with surgery    Hyperlipidemia     Hypertension     checks BP at home: 128-130/70s    Left wrist fracture 2018    Psoriatic arthritis     LOWER BACK, LEFT HAND, WRIST AND FINGERS    Urinary incontinence 12/21/2017    Over active bladder-history           Past Surgical History:   Procedure Laterality Date    BUNIONECTOMY Left     COLONOSCOPY      X 2     EGD N/A 05/18/2020    Procedure: EGD w/ bx's;  Surgeon: Sabas Sous, MD;  Location: [facility];  Service: Gastroenterology;  Laterality: N/A;    EGD  12/2020    HYSTERECTOMY       partial    LAPAROSCOPIC GASTRECTOMY SLEEVE WITH HIATAL HERNIA N/A 07/28/2020    Procedure: LAPAROSCOPIC GASTRECTOMY SLEEVE WITH HIATAL HERNIA, OMENTOPEXY, LAPAROSCOPIC RESECTION OF ANTRAL AND FUNDIC GASTRIC MASS;  Surgeon: Nicola Police, DO;  Location: [facility];  Service: General;  Laterality: N/A;          Social History     Tobacco Use    Smoking status: Never    Smokeless tobacco: Never    Tobacco comments:     Never smoked or used tobacco   Substance Use Topics    Alcohol use: Yes     Comment: once-twice a month         Family History   Problem Relation Age of Onset    Hypertension Mother     Hypertension Father     Hypertension Sister     Hypertension Brother     Migraines Neg Hx     Stroke Neg Hx     Seizures Neg Hx    No GI cancers    Current Outpatient  Medications   Medication Instructions    atorvastatin (LIPITOR) 10 MG tablet TAKE 1 TABLET BY MOUTH EVERY DAY AT NIGHT    B Complex Vitamins (B-Complex/B-12) Tab 1 tablet, Oral, Daily, Takes liquid    estradiol (ESTRACE) 0.1 MG/GM vaginal cream TWICE A WEEK    lisinopril (ZESTRIL) 5 mg, Oral, Daily    VITAMIN D PO 5,000 IU, Oral, Daily          Allergies:  No Known Allergies     Review of Systems: A comprehensive 12 point review of systems was negative except for as mentioned in history of present illness.    PHYSICAL EXAMINATION:   BP 151/70   Pulse 60   Temp 97.6 F (36.4 C) (Temporal)   Resp 20   Wt 91.2 kg (201 lb)   SpO2 99%   BMI 30.56 kg/m     General:    Alert, cooperative, no distress, appears stated age  Head:    Normocephalic, without obvious abnormality, atraumatic   Eyes:    PERLA, conjunctiva/corneas clear, EOM's intact   Throat:   Lips, mucosa,tongue normal; teeth and gums normal   Back:     Symmetric, no curvature, no CVA tenderness   Lungs:     Clear to auscultation bilaterally, respirations unlabored   Chest wall:    No tenderness or deformity   Heart:    Regular rate and rhythm, S1 and S2 normal    No murmur, rub or gallop   Abdomen:     Soft, non-tender, bowel sounds active. no masses    no organomegaly       Extremities:   Extremities normal, atraumatic, no cyanosis or edema       Lab Tests:   No visits with results within 1 Day(s) from this visit.   Latest known visit with results is:   Appointment on 11/12/2020   Component Date Value Ref Range Status    Vitamin A 11/12/2020 38.4  32.5 - 78.0 Final    Vitamin B1 (Thiamine) 11/12/2020 6 (A) 8 - 30 nmol/L Final    Cholesterol 11/12/2020 201 (A) 0 - 199 mg/dL Final    Triglycerides 11/12/2020 86  34 - 149 mg/dL Final    HDL 50/06/3817 54  40 - 9,999 mg/dL Final    LDL Calculated 11/12/2020 299 (A) 0 - 99 mg/dL Final    VLDL Calculated 11/12/2020 17  10 - 40 mg/dL Final    Cholesterol / HDL Ratio 11/12/2020 3.7  See Below Final    Folate  11/12/2020 8.5  See below ng/mL Final    Vitamin B-12 11/12/2020 631  211 - 911 pg/mL Final    WBC 11/12/2020 4.46  3.10 - 9.50 x10 3/uL Final    Hgb 11/12/2020 12.4  11.4 - 14.8 g/dL Final    Hematocrit 37/16/9678 41.2  34.7 - 43.7 % Final    Platelets 11/12/2020 153  142 - 346 x10 3/uL Final    RBC 11/12/2020 4.52  3.90 - 5.10 x10 6/uL Final    MCV 11/12/2020 91.2  78.0 - 96.0 fL Final    MCH 11/12/2020 27.4  25.1 - 33.5 pg Final    MCHC 11/12/2020 30.1 (A) 31.5 - 35.8 g/dL Final    RDW 93/81/0175 15  11 - 15 % Final    MPV 11/12/2020 14.0 (A) 8.9 - 12.5 fL Final    Nucleated RBC 11/12/2020 0.0  0.0 - 0.0 /100 WBC Final    Absolute NRBC 11/12/2020 0.00  0.00 - 0.00 x10 3/uL Final    Glucose 11/12/2020 89  70 - 100 mg/dL Final    BUN 08/16/8526 8.0  7.0 - 19.0 mg/dL Final    Creatinine 78/24/2353 0.8  0.4 - 1.5 mg/dL Final    Sodium 61/44/3154 141  136 - 145 mEq/L Final    Potassium 11/12/2020 4.6  3.5 - 5.1 mEq/L Final    Chloride 11/12/2020 107  100 - 111 mEq/L Final    CO2 11/12/2020 28  21 - 29 mEq/L Final    Calcium 11/12/2020 9.5  8.5 - 10.5 mg/dL Final    Protein, Total 11/12/2020 6.8  6.0 - 8.3 g/dL Final    Albumin 00/86/7619 3.8  3.5 - 5.0 g/dL Final    AST (SGOT) 50/93/2671 20  5 - 34 U/L Final    ALT 11/12/2020 16  0 - 55 U/L Final    Alkaline Phosphatase 11/12/2020 91  37 -  117 U/L Final    Bilirubin, Total 11/12/2020 0.7  0.2 - 1.2 mg/dL Final    Globulin 16/07/9603 3.0  2.0 - 3.7 g/dL Final    Albumin/Globulin Ratio 11/12/2020 1.3  0.9 - 2.2 Final    Anion Gap 11/12/2020 6.0  5.0 - 15.0 Final    Iron 11/12/2020 120  40 - 145 ug/dL Final    UIBC 54/06/8118 168  126 - 382 ug/dL Final    TIBC 14/78/2956 288  265 - 497 ug/dL Final    Iron Saturation 11/12/2020 42  15 - 50 % Final    PTH Intact 11/12/2020 87.7 (A) 17.7 - 84.5 pg/mL Final    Copper 11/12/2020 1.24  0.75 - 1.45 Final    Zinc 11/12/2020 0.79  0.66 - 1.10 Final    TSH 11/12/2020 1.45  0.35 - 4.94 uIU/mL Final    Vitamin D, 25 OH, Total  11/12/2020 39  30 - 100 ng/mL Final    EGFR 11/12/2020 >60.0   Final    Hemolysis Index 11/12/2020 31 (A) 0 - 24 Final         @GETRADS  (2d,1)@    Assessment & Plan:     Personal history of colon polyps.  Recommend endoscopic evaluation of the Lower gastrointestinal tract. I informed in detail the benefits of such evaluation. I then discussed the potential very rare risks including bleeding, perforation, missed lesions, missed cancers, rare need for emergent surgery and occasional need for repeat procedures. The standard instruction for education and preparation were given. The patient was given an opportunity to ask questions and is willing to proceed ahead. Shared informed consent was obtained.      Electronic Signature:  Franki Monte, MD (Signed 04/13/2021 9:14 AM)  Service - Gastroenterology

## 2021-04-13 NOTE — Discharge Instr - AVS First Page (Addendum)
Reason for your Hospital Admission:  Colonoscopy      Instructions for after your discharge:    General Instructions:    Following sedation, your judgment, perception, and coordination are considered impaired. Even though you may feel awake and alert, you are considered legally intoxicated. Therefore, until the next morning:  Do not drive  Do not operate appliances or equipment that requires reaction time (e.g. stove, electrical tools, machinery)  Do not sign legal documents or be involved in important decisions  Do not smoke if alone  Do not drink alcoholic beverages    Go directly home and rest for several hours before resuming your routine activities  It is highly recommended to have a responsible adult stay with you for the next 24 hours    Tenderness, swelling, or pain may occur at the IV site where you received sedation. If you experience this, apply warm soaks to the area. Notify your physician if this persists.   If you have questions or problems contact your MD immediately. If you need immediate attention, call your MD, 911 and/or go to the nearest emergency room.       Report to the Physician any of the following:    For Colonoscopy, Sigmoidoscopy, or Proctoscopy:  Severe and persistent abdominal pain/bloating which does not subside within 2-3 hours  Large amount of rectal bleeding (some mucous blood streaking may occur, especially if biopsy or polypectomy was done or if hemorrhoids are present  Nausea/vomiting  Fever/chills within 24 hours after procedure. Temp > 101 degrees Farenheit    * If polyp has been removed, DO NOT take aspirin or aspirin-containing products (e.g. Anacin, Alka Seltzer, Bufferin, etc) or non-steroidal anti-inflammatory drugs (e.g. Advil, Motrin, etc) for 2 days unless otherwise advised by doctor. Tylenol or Extra Strength Tylenol is permitted.   *Special instructions: Follow MD instructions on procedural report.       Lower GI

## 2021-04-13 NOTE — Transfer of Care (Signed)
Anesthesia Transfer of Care Note    Patient: Barbara Rubio    Procedures performed: Procedure(s):  COLONOSCOPY    Anesthesia type: General TIVA    Patient location:[facility]    Last vitals:   Vitals:    04/13/21 0904   BP: 151/70   Pulse: 60   Resp: 20   Temp: 36.4 C (97.6 F)   SpO2: 99%       Post pain: Patient not complaining of pain, continue current therapy      Mental Status:awake    Respiratory Function: tolerating room air    Cardiovascular: stable    Nausea/Vomiting: patient not complaining of nausea or vomiting    Hydration Status: adequate    Post assessment: no apparent anesthetic complications, no reportable events and no evidence of recall    Signed by: Henreitta Leber, MD  04/13/21 9:56 AM

## 2021-04-13 NOTE — Anesthesia Postprocedure Evaluation (Signed)
Anesthesia Post Evaluation    Patient: Barbara Rubio    Procedure(s):  COLONOSCOPY    Anesthesia type: general    Last Vitals:   Vitals Value Taken Time   BP 127/62 04/13/21 0957   Temp 36.3 C (97.3 F) 04/13/21 0957   Pulse 61 04/13/21 0957   Resp 17 04/13/21 0957   SpO2 98 % 04/13/21 0957                 Anesthesia Post Evaluation:     Patient Evaluated: PACU    Level of Consciousness: awake and alert    Pain Management: adequate    Airway Patency: patent    Anesthetic complications: No      PONV Status: none    Cardiovascular status: acceptable  Respiratory status: acceptable  Hydration status: acceptable        Signed by: Henreitta Leber, MD, 04/13/2021 10:06 AM

## 2021-04-14 ENCOUNTER — Encounter: Payer: Self-pay | Admitting: Internal Medicine

## 2021-04-19 LAB — LAB USE ONLY - HISTORICAL SURGICAL PATHOLOGY

## 2021-04-21 ENCOUNTER — Encounter: Payer: Self-pay | Admitting: Internal Medicine

## 2021-04-29 ENCOUNTER — Encounter (INDEPENDENT_AMBULATORY_CARE_PROVIDER_SITE_OTHER): Payer: Self-pay

## 2021-04-30 ENCOUNTER — Encounter (INDEPENDENT_AMBULATORY_CARE_PROVIDER_SITE_OTHER): Payer: Self-pay

## 2021-05-01 ENCOUNTER — Other Ambulatory Visit (HOSPITAL_BASED_OUTPATIENT_CLINIC_OR_DEPARTMENT_OTHER): Payer: Self-pay | Admitting: Nurse Practitioner

## 2021-05-01 DIAGNOSIS — Z9884 Bariatric surgery status: Secondary | ICD-10-CM

## 2021-05-01 DIAGNOSIS — K219 Gastro-esophageal reflux disease without esophagitis: Secondary | ICD-10-CM

## 2021-05-27 ENCOUNTER — Other Ambulatory Visit (FREE_STANDING_LABORATORY_FACILITY): Payer: Medicare Other

## 2021-05-27 ENCOUNTER — Other Ambulatory Visit: Payer: Medicare Other

## 2021-05-27 DIAGNOSIS — Z9884 Bariatric surgery status: Secondary | ICD-10-CM

## 2021-05-27 DIAGNOSIS — K9089 Other intestinal malabsorption: Secondary | ICD-10-CM

## 2021-05-27 DIAGNOSIS — E559 Vitamin D deficiency, unspecified: Secondary | ICD-10-CM

## 2021-05-27 DIAGNOSIS — R110 Nausea: Secondary | ICD-10-CM

## 2021-05-27 DIAGNOSIS — R7989 Other specified abnormal findings of blood chemistry: Secondary | ICD-10-CM

## 2021-05-27 DIAGNOSIS — E669 Obesity, unspecified: Secondary | ICD-10-CM

## 2021-05-27 DIAGNOSIS — K912 Postsurgical malabsorption, not elsewhere classified: Secondary | ICD-10-CM

## 2021-05-27 DIAGNOSIS — E519 Thiamine deficiency, unspecified: Secondary | ICD-10-CM

## 2021-05-27 DIAGNOSIS — Z713 Dietary counseling and surveillance: Secondary | ICD-10-CM

## 2021-05-27 LAB — COMPREHENSIVE METABOLIC PANEL
ALT: 11 U/L (ref 0–55)
AST (SGOT): 14 U/L (ref 5–34)
Albumin/Globulin Ratio: 1.3 (ref 0.9–2.2)
Albumin: 3.7 g/dL (ref 3.5–5.0)
Alkaline Phosphatase: 86 U/L (ref 37–117)
Anion Gap: 7 (ref 5.0–15.0)
BUN: 12 mg/dL (ref 7.0–19.0)
Bilirubin, Total: 0.7 mg/dL (ref 0.2–1.2)
CO2: 30 mEq/L — ABNORMAL HIGH (ref 21–29)
Calcium: 9.4 mg/dL (ref 8.5–10.5)
Chloride: 104 mEq/L (ref 100–111)
Creatinine: 0.8 mg/dL (ref 0.4–1.5)
Globulin: 2.9 g/dL (ref 2.0–3.6)
Glucose: 84 mg/dL (ref 70–100)
Potassium: 4.1 mEq/L (ref 3.5–5.1)
Protein, Total: 6.6 g/dL (ref 6.0–8.3)
Sodium: 141 mEq/L (ref 136–145)

## 2021-05-27 LAB — CBC
Absolute NRBC: 0 10*3/uL (ref 0.00–0.00)
Hematocrit: 41.8 % (ref 34.7–43.7)
Hgb: 12.7 g/dL (ref 11.4–14.8)
MCH: 27.5 pg (ref 25.1–33.5)
MCHC: 30.4 g/dL — ABNORMAL LOW (ref 31.5–35.8)
MCV: 90.7 fL (ref 78.0–96.0)
MPV: 13 fL — ABNORMAL HIGH (ref 8.9–12.5)
Nucleated RBC: 0 /100 WBC (ref 0.0–0.0)
Platelets: 163 10*3/uL (ref 142–346)
RBC: 4.61 10*6/uL (ref 3.90–5.10)
RDW: 14 % (ref 11–15)
WBC: 4.17 10*3/uL (ref 3.10–9.50)

## 2021-05-27 LAB — IRON PROFILE
Iron Saturation: 37 % (ref 15–50)
Iron: 113 ug/dL (ref 40–145)
TIBC: 303 ug/dL (ref 265–497)
UIBC: 190 ug/dL (ref 126–382)

## 2021-05-27 LAB — PTH, INTACT: PTH Intact: 65.7 pg/mL (ref 17.7–84.5)

## 2021-05-27 LAB — HEMOLYSIS INDEX: Hemolysis Index: 0 Index (ref 0–24)

## 2021-05-27 LAB — GFR: EGFR: >60

## 2021-05-27 LAB — VITAMIN B12: Vitamin B-12: 580 pg/mL (ref 211–911)

## 2021-05-27 LAB — VITAMIN D,25 OH,TOTAL: Vitamin D, 25 OH, Total: 30 ng/mL (ref 30–100)

## 2021-05-27 LAB — FOLATE: Folate: 9.5 ng/mL

## 2021-05-28 LAB — ZINC: Zinc: 86 ug/dL (ref 60–106)

## 2021-05-28 LAB — COPPER, SERUM: Copper: 125 ug/dL (ref 77–206)

## 2021-05-30 ENCOUNTER — Encounter (INDEPENDENT_AMBULATORY_CARE_PROVIDER_SITE_OTHER): Payer: Self-pay

## 2021-05-30 LAB — VITAMIN A: Vitamin A: 41.8 ug/dL (ref 32.5–78.0)

## 2021-05-31 ENCOUNTER — Encounter (INDEPENDENT_AMBULATORY_CARE_PROVIDER_SITE_OTHER): Payer: Self-pay

## 2021-06-01 LAB — VITAMIN B1, WHOLE BLOOD: Whole Blood Vitamin B1: 79 nmol/L (ref 70–180)

## 2021-06-10 ENCOUNTER — Encounter (HOSPITAL_BASED_OUTPATIENT_CLINIC_OR_DEPARTMENT_OTHER): Payer: Self-pay | Admitting: Surgery

## 2021-06-10 ENCOUNTER — Telehealth (INDEPENDENT_AMBULATORY_CARE_PROVIDER_SITE_OTHER): Payer: Self-pay | Admitting: Internal Medicine

## 2021-06-10 ENCOUNTER — Ambulatory Visit (INDEPENDENT_AMBULATORY_CARE_PROVIDER_SITE_OTHER): Payer: Medicare Other | Admitting: Surgery

## 2021-06-10 VITALS — BP 143/92 | HR 71 | Temp 97.8°F | Ht 68.0 in | Wt 201.0 lb

## 2021-06-10 DIAGNOSIS — I10 Essential (primary) hypertension: Secondary | ICD-10-CM

## 2021-06-10 DIAGNOSIS — Z9884 Bariatric surgery status: Secondary | ICD-10-CM

## 2021-06-10 MED ORDER — LISINOPRIL 5 MG PO TABS
5.0000 mg | ORAL_TABLET | Freq: Every day | ORAL | 3 refills | Status: DC
Start: 2021-06-10 — End: 2022-06-29

## 2021-06-10 NOTE — Progress Notes (Signed)
Assessment:  Status Post Robotic/laparoscopic SG-Sleeve      Plan:  1.  Patient is to continue with portion control.  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  2.  Patient is to increase exercise. Needs to start resistant exercises.  3.  Patient is to continue with supplements.  4.  Patient is to follow up with PCP as needed.  5.  Patient is to follow up in 3 month(s)          Subjective:  [name] returns for follow up 10 month(s) post robotic/laparoscopic SG-Sleeve. She is doing well with 47 lbs. total weight loss. She is tolerating a regular diet and denies any nausea, vomiting, reflux or abdominal pain.  She is compliant with portion control and vitamin supplementation. Her energy level is good. She is exercising regularly.  Lab work was reviewed.    Objective:  Blood pressure (!) 143/92, pulse 71, temperature 97.8 F (36.6 C), temperature source Temporal, height 5\' 8" , weight 201 lb.     The following portions of the patient's history were reviewed and updated as appropriate: allergies, current medications, past family history, past medical history, past social history, past surgical history, and problem list.    General appearance: alert, appears stated age, and cooperative  Head: Normocephalic, without obvious abnormality, atraumatic  Eyes: negative findings: conjunctivae and sclerae normal      Appointment on 05/27/2021   Component Date Value Ref Range Status    WBC 05/27/2021 4.17  3.10 - 9.50 x10 3/uL Final    Hgb 05/27/2021 12.7  11.4 - 14.8 g/dL Final    Hematocrit 16/07/9603 41.8  34.7 - 43.7 % Final    Platelets 05/27/2021 163  142 - 346 x10 3/uL Final    RBC 05/27/2021 4.61  3.90 - 5.10 x10 6/uL Final    MCV 05/27/2021 90.7  78.0 - 96.0 fL Final    MCH 05/27/2021 27.5  25.1 - 33.5 pg Final    MCHC 05/27/2021 30.4 (A) 31.5 - 35.8 g/dL Final    RDW 54/06/8118 14  11 - 15 % Final    MPV 05/27/2021 13.0 (A) 8.9 - 12.5 fL Final     Nucleated RBC 05/27/2021 0.0  0.0 - 0.0 /100 WBC Final    Absolute NRBC 05/27/2021 0.00  0.00 - 0.00 x10 3/uL Final    Glucose 05/27/2021 84  70 - 100 mg/dL Final    Comment: ADA guidelines for diabetes mellitus:  Fasting:  Equal to or greater than 126 mg/dL  Random:   Equal to or greater than 200 mg/dL      BUN 14/78/2956 21.3  7.0 - 19.0 mg/dL Final    Creatinine 08/65/7846 0.8  0.4 - 1.5 mg/dL Final    Sodium 96/29/5284 141  136 - 145 mEq/L Final    Potassium 05/27/2021 4.1  3.5 - 5.1 mEq/L Final    Chloride 05/27/2021 104  100 - 111 mEq/L Final    CO2 05/27/2021 30 (A) 21 - 29 mEq/L Final    Calcium 05/27/2021 9.4  8.5 - 10.5 mg/dL Final    Protein, Total 05/27/2021 6.6  6.0 - 8.3 g/dL Final    Albumin 13/24/4010 3.7  3.5 - 5.0 g/dL Final    AST (SGOT) 27/25/3664 14  5 - 34 U/L Final    ALT 05/27/2021 11  0 - 55 U/L Final    Alkaline Phosphatase 05/27/2021 86  37 - 117 U/L  Final    Bilirubin, Total 05/27/2021 0.7  0.2 - 1.2 mg/dL Final    Globulin 21/30/8657 2.9  2.0 - 3.6 g/dL Final    Albumin/Globulin Ratio 05/27/2021 1.3  0.9 - 2.2 Final    Anion Gap 05/27/2021 7.0  5.0 - 15.0 Final    Comment: Calculated AGAP = Na - (CL + CO2)  Interpret with caution; calculated AGAP may not reflect patient's  true clinical status.      Iron 05/27/2021 113  40 - 145 ug/dL Final    UIBC 84/69/6295 190  126 - 382 ug/dL Final    TIBC 28/41/3244 303  265 - 497 ug/dL Final    Iron Saturation 05/27/2021 37  15 - 50 % Final    Vitamin A 05/27/2021 41.8  32.5 - 78.0 mcg/dL Final    Comment:   -------------------ADDITIONAL INFORMATION-------------------  This test was developed and its performance characteristics  determined by [facility] in a manner consistent with CLIA  requirements. This test has not been cleared or approved by  the U.S. Food and Drug Administration.    Test Performed by:  [facility]  [address] [place], [place], [place] [address]  Lab Director: Paul Dykes M.D. Ph.D.;  CLIA# [id number]      Whole Blood Vitamin B1 05/27/2021 79  70 - 180 nmol/L Final    Comment:   -------------------ADDITIONAL INFORMATION-------------------  This test was developed and its performance characteristics  determined by [facility] in a manner consistent with CLIA  requirements. This test has not been cleared or approved by  the U.S. Food and Drug Administration.    Test Performed by:  [facility]  [address] [place], [place], [place] [address]  Lab Director: Paul Dykes M.D. Ph.D.; CLIA# [id number]      Vitamin B-12 05/27/2021 580  211 - 911 pg/mL Final    PTH Intact 05/27/2021 65.7  17.7 - 84.5 pg/mL Final    Copper 05/27/2021 125  77 - 206 mcg/dL Final    Comment:   -------------------ADDITIONAL INFORMATION-------------------  This test was developed and its performance characteristics  determined by [facility], [facility], [facility] in a manner consistent with CLIA  requirements. This test has not been cleared or approved by  the U.S. Food and Drug Administration.    Test Performed by:  [facility]  [address] [place], [place], [place] [address]  Lab Director: Paul Dykes M.D. Ph.D.; CLIA# [id number]      Zinc 05/27/2021 86  60 - 106 mcg/dL Final    Comment:   -------------------ADDITIONAL INFORMATION-------------------  This test was developed and its performance characteristics  determined by [facility] in a manner consistent with CLIA  requirements. This test has not been cleared or approved by  the U.S. Food and Drug Administration.    Test Performed by:  [facility]  [address] [place], [place], [place] [address]  Lab Director: Paul Dykes M.D. Ph.D.; CLIA# [id number]      Folate 05/27/2021 9.5  See below ng/mL Final    Comment: Deficient    : <3.5 ng/mL  Intermediate : 3.5 - 5.4 ng/mL  Normal       : Greater Than 5.4 ng/mL      Vitamin D, 25 OH, Total 05/27/2021 30  30 - 100 ng/mL Final     Comment: Vitamin D, 25-OH, Total: Testing performed using Abbott  Alinity chemiluminescent Microparticle immunoassay methodology.  Method dependent minor difference may exist based on the test  platform used.  Vitamin D levels of <20 ng/ml are considered deficiency.  Vitamin D levels of 20-30 ng/ml suggest insufficiency.  Optimal Vitamin D levels are > or =30 ng/ml.      EGFR 05/27/2021 >60.0   Final    Comment: Disease State Reference Ranges:    Chronic Kidney Disease; < 60 ml/min/1.73 sq.m    Kidney Failure; < 15 ml/min/1.73 sq.m    [Calculated using IDMS-Traceable MDRD equation (based on    gender, age and black vs. non-black race) recommended by    [employer] Kidney Disease Education Program. No data    available for non-white, non-black race.]    GFR estimates are unreliable in patients with:    Rapidly changing kidney function or recent dialysis,    extreme age, body size or body composition(obesity,    severe malnutrition). Abnormal muscle mass (limb    amputation, muscle wasting). In these patients,    alternative determinations of GFR should be obtained.      Hemolysis Index 05/27/2021 0  0 - 24 Index Final       No results found..         No results found.      This note was generated by the [facility] EMR system/Dragon speech recognition and may contain inherent errors or omissions not intended by the user. Grammatical errors, random word insertions, deletions, pronoun errors and incomplete sentences are occasional consequences of this technology due to software limitations. Not all errors are caught or corrected. If there are questions or concerns about the content of this note or information contained within the body of this dictation they should be addressed directly with the author for clarification.    Owen Pratte R. Meher Kucinski, DO, FACS, FASMBS, FACOS

## 2021-06-10 NOTE — Telephone Encounter (Signed)
Name, strength, directions of requested refill(s):    lisinopril (ZESTRIL) 5 MG tablet      Pharmacy to send refill to or patient to pick up rx from office (mark requested pharmacy in BOLD):      [id number] - [place], [place] - [address]  [address]  [place] [address]  Phone: [phone] Fax: [phone]        Please mark "X" next to the preferred call back number:    Mobile:   Telephone Information:   Mobile [phone]       Home: @HOMEPHONE @    Work: @WORKPHONE @        Medication refill request , see above. Thank you     Additional Notes:     Next Visit:

## 2021-06-10 NOTE — Telephone Encounter (Signed)
Refill sent.

## 2021-06-12 ENCOUNTER — Other Ambulatory Visit (HOSPITAL_BASED_OUTPATIENT_CLINIC_OR_DEPARTMENT_OTHER): Payer: Self-pay | Admitting: Nurse Practitioner

## 2021-06-30 ENCOUNTER — Other Ambulatory Visit (INDEPENDENT_AMBULATORY_CARE_PROVIDER_SITE_OTHER): Payer: Self-pay | Admitting: Internal Medicine

## 2021-06-30 ENCOUNTER — Encounter (INDEPENDENT_AMBULATORY_CARE_PROVIDER_SITE_OTHER): Payer: Self-pay

## 2021-06-30 DIAGNOSIS — E782 Mixed hyperlipidemia: Secondary | ICD-10-CM

## 2021-07-01 ENCOUNTER — Encounter (INDEPENDENT_AMBULATORY_CARE_PROVIDER_SITE_OTHER): Payer: Self-pay

## 2021-07-06 ENCOUNTER — Encounter (INDEPENDENT_AMBULATORY_CARE_PROVIDER_SITE_OTHER): Payer: Self-pay

## 2021-07-15 ENCOUNTER — Ambulatory Visit (INDEPENDENT_AMBULATORY_CARE_PROVIDER_SITE_OTHER): Payer: Medicare Other | Admitting: Internal Medicine

## 2021-07-16 ENCOUNTER — Ambulatory Visit (INDEPENDENT_AMBULATORY_CARE_PROVIDER_SITE_OTHER): Payer: Medicare Other

## 2021-07-16 DIAGNOSIS — Z23 Encounter for immunization: Secondary | ICD-10-CM

## 2021-07-25 ENCOUNTER — Telehealth (HOSPITAL_BASED_OUTPATIENT_CLINIC_OR_DEPARTMENT_OTHER): Payer: Medicare Other | Admitting: Registered"

## 2021-07-30 ENCOUNTER — Encounter (INDEPENDENT_AMBULATORY_CARE_PROVIDER_SITE_OTHER): Payer: Self-pay

## 2021-07-31 ENCOUNTER — Encounter (INDEPENDENT_AMBULATORY_CARE_PROVIDER_SITE_OTHER): Payer: Self-pay

## 2021-08-02 ENCOUNTER — Ambulatory Visit (INDEPENDENT_AMBULATORY_CARE_PROVIDER_SITE_OTHER): Payer: Medicare Other | Admitting: Internal Medicine

## 2021-08-16 ENCOUNTER — Encounter (INDEPENDENT_AMBULATORY_CARE_PROVIDER_SITE_OTHER): Payer: Self-pay | Admitting: Internal Medicine

## 2021-08-16 ENCOUNTER — Ambulatory Visit (INDEPENDENT_AMBULATORY_CARE_PROVIDER_SITE_OTHER): Payer: Medicare Other | Admitting: Internal Medicine

## 2021-08-16 VITALS — BP 115/63 | HR 79 | Temp 97.5°F | Wt 202.0 lb

## 2021-08-16 DIAGNOSIS — Z9884 Bariatric surgery status: Secondary | ICD-10-CM

## 2021-08-16 DIAGNOSIS — M549 Dorsalgia, unspecified: Secondary | ICD-10-CM

## 2021-08-16 DIAGNOSIS — E669 Obesity, unspecified: Secondary | ICD-10-CM

## 2021-08-16 DIAGNOSIS — E782 Mixed hyperlipidemia: Secondary | ICD-10-CM

## 2021-08-16 DIAGNOSIS — I10 Essential (primary) hypertension: Secondary | ICD-10-CM

## 2021-08-16 NOTE — Progress Notes (Signed)
Have you seen any specialists/other providers since your last visit with us?    No    Arm preference verified?   Yes    The patient is due for shingles vaccine

## 2021-08-16 NOTE — Progress Notes (Signed)
Subjective:      Date: 08/16/2021 1:20 PM   Patient ID: Barbara Rubio is a 70 y.o. female.    Chief Complaint:  Chief Complaint   Patient presents with    Back Pain     More than a week .. still have issues with constipation        HPI:  HPI    HYPERTENSION  Visit type: routine follow-up of HTN  Type: essential  Associated Conditions: no evidence of CHF  Compliance: is compliant with the current regimen  Comorbid illness: none  Interval events: no interval events  Interval symptoms: none  Patient denies: dizziness, chest pain, shortness of breath, palpitations, pre-syncope, edema, DOE, orthopnea or PND  Average ambulatory systolic pressure: does not check ambulatory blood pressure  Average ambulatory diastolic pressure: does not check ambulatory blood pressure  End organ damage from HTN: none  Exercise: regular  Response to medications: has been good  Prior testing: a chemistry profile showed normal renal function  Additional concerns: In addition, pt also needs medication refill.    Pt has a history of hyperlipidemia and is compliant with anti-lipidemic therapy.  Pt denies side effects of anti-lipidemic therapy - specifically denies myalgia.      Back pain   Lumbar   No radiation/tingling .numbness  Getting better     Lab Results   Component Value Date    NA 141 05/27/2021    K 4.1 05/27/2021    CL 104 05/27/2021    CO2 30 (H) 05/27/2021    GLU 84 05/27/2021    BUN 12.0 05/27/2021    CREAT 0.8 05/27/2021    CA 9.4 05/27/2021    PROT 6.6 05/27/2021    ALB 3.7 05/27/2021    GLOB 2.9 05/27/2021    AGRATIO 1.3 05/27/2021    BILITOTAL 0.7 05/27/2021    AST 14 05/27/2021    ALT 11 05/27/2021    ALKPHOS 86 05/27/2021    EGFR >60.0 05/27/2021     Lab Results   Component Value Date    CHOL 201 (H) 11/12/2020    CHOL 208 (H) 06/09/2020    CHOL 216 (H) 05/13/2020     Lab Results   Component Value Date    HDL 54 11/12/2020    HDL 63 06/09/2020    HDL 63 05/13/2020     Lab Results   Component Value Date    LDL 130 (H)  11/12/2020    LDL 125 (H) 06/09/2020    LDL 132 (H) 05/13/2020     Lab Results   Component Value Date    TRIG 86 11/12/2020    TRIG 98 06/09/2020    TRIG 106 05/13/2020         Problem List:  Patient Active Problem List   Diagnosis    Essential hypertension    Mixed hyperlipidemia    Pain in both hands    Gastroesophageal reflux disease    Hiatal hernia    Vitamin D deficiency    S/P laparoscopic sleeve gastrectomy    Obesity (BMI 30-39.9)    Screen for colon cancer       Current Medications:  Outpatient Medications Marked as Taking for the 08/16/21 encounter (Office Visit) with Morrison Old, MD   Medication Sig Dispense Refill    atorvastatin (LIPITOR) 10 MG tablet TAKE 1 TABLET BY MOUTH EVERY DAY AT NIGHT 90 tablet 1    B Complex Vitamins (B-Complex/B-12) Tab Take 1 tablet by mouth daily  Takes liquid      estradiol (ESTRACE) 0.1 MG/GM vaginal cream TWICE A WEEK        lisinopril (ZESTRIL) 5 MG tablet Take 1 tablet (5 mg total) by mouth daily 90 tablet 3    VITAMIN D PO Take 5,000 IU by mouth daily         Allergies:  No Known Allergies    Past Medical History:  Past Medical History:   Diagnosis Date    Abnormal vision     wears glasses    Gastroesophageal reflux disease 10/23/2013    Acid reflux a lot after meals. TUMS, PRILOSEC    Hiatal hernia     repaired with surgery    Hyperlipidemia     Hypertension     checks BP at home: 128-130/70s    Left wrist fracture 2018    Psoriatic arthritis     LOWER BACK, LEFT HAND, WRIST AND FINGERS    Urinary incontinence 12/21/2017    Over active bladder-history       Past Surgical History:  Past Surgical History:   Procedure Laterality Date    BUNIONECTOMY Left     COLONOSCOPY      X 2     COLONOSCOPY N/A 04/13/2021    Procedure: COLONOSCOPY;  Surgeon: Franki Monte, MD;  Location: [facility];  Service: Gastroenterology;  Laterality: N/A;    EGD N/A 05/18/2020    Procedure: EGD w/ bx's;  Surgeon: Sabas Sous, MD;  Location: [facility];  Service:  Gastroenterology;  Laterality: N/A;    EGD  12/2020    HYSTERECTOMY       partial    LAPAROSCOPIC GASTRECTOMY SLEEVE WITH HIATAL HERNIA N/A 07/28/2020    Procedure: LAPAROSCOPIC GASTRECTOMY SLEEVE WITH HIATAL HERNIA, OMENTOPEXY, LAPAROSCOPIC RESECTION OF ANTRAL AND FUNDIC GASTRIC MASS;  Surgeon: Nicola Police, DO;  Location: [facility];  Service: General;  Laterality: N/A;       Family History:  Family History   Problem Relation Age of Onset    Hypertension Mother     Hypertension Father     Hypertension Sister     Hypertension Brother     Migraines Neg Hx     Stroke Neg Hx     Seizures Neg Hx        Social History:  Social History     Socioeconomic History    Marital status: Divorced   Tobacco Use    Smoking status: Never    Smokeless tobacco: Never    Tobacco comments:     Never smoked or used tobacco   Vaping Use    Vaping Use: Never used   Substance and Sexual Activity    Alcohol use: Yes     Comment: once-twice a month    Drug use: Never   Other Topics Concern    Dietary supplements / vitamins Yes    Anesthesia problems No    Blood thinners No    Pregnant No    Future Children No    Number of Pregnancies? Yes     Comment: 1    Number of children Yes     Comment: 1    Miscarriages / Abortions? No    Eats large amounts No    Excessive Sweets Yes    Skips meals No    Eats excessive starches Yes    Snacks or grazes Yes    Emotional eater No    Eats fried food Yes  Eats fast food Yes    Diet Center No    Doylene Bode Yes    LA Weight Loss No    Nutri-System Yes    Opti-Fast / Medi-Fast No    Overeaters Anonymous No    [facility] No    TOPS No    Weight Watchers Yes    Atkins No    Binging / Purging No    Calorie Counting Yes    Fasting No    High Protein Yes    Low Carb Yes    Low Fat Yes    [facility] Diet No    Slim Fast No    [place] Yes    Stationary cycle or treadmill No    Gym/fitness Classes No    Home exercise/video No    Swimming No    Weight training No    Walking or  running Yes    Hospitalization No    Hypnosis No    Physical therapy No    Psychological therapy No    Residential program No    Acutrim No    [place] No    Contrave No    Dexatrim No    Diethylpropion No    Fastin No    Fen - Phen No    Ionamin / Adipex No    Phentermine No    Qsymia No    Prozac No    Saxenda No    Topamax No    Wellbutrin No    Xenical (Orlistat, Alli) No    Other Med No    No impairment No    Walks with cane/crutch No    Requires a wheelchair No    Bedridden No    Are you currently being treated for depression? No    Do you snore? Yes    Are you receiving any medical or psychological services? No    Do you ever wake up at night gasping for breath? Yes    Do you have or have you been treated for an eating disorder? No    Anyone ever told you that you stop breathing while asleep? No    Do you exercise regularly? No    Have you or family member ever have trouble with anesthesia? Yes     Social Determinants of Health     Financial Resource Strain: Medium Risk    Difficulty of Paying Living Expenses: Somewhat hard   Food Insecurity: Food Insecurity Present    Worried About Programme researcher, broadcasting/film/video in the Last Year: Sometimes true    Ran Out of Food in the Last Year: Sometimes true   Transportation Needs: No Transportation Needs    Lack of Transportation (Medical): No    Lack of Transportation (Non-Medical): No   Physical Activity: Sufficiently Active    Days of Exercise per Week: 5 days    Minutes of Exercise per Session: 90 min   Stress: No Stress Concern Present    Feeling of Stress : Only a little   Social Connections: Moderately Integrated    Frequency of Communication with Friends and Family: More than three times a week    Frequency of Social Gatherings with Friends and Family: More than three times a week    Attends Religious Services: More than 4 times per year    Active Member of [employer] or Organizations: Yes    Attends Banker Meetings: More than 4 times per year  Marital Status:  Divorced   Catering manager Violence: Not At Risk    Fear of Current or Ex-Partner: No    Emotionally Abused: No    Physically Abused: No    Sexually Abused: No   Housing Stability: Low Risk     Unable to Pay for Housing in the Last Year: No    Number of Places Lived in the Last Year: 1    Unstable Housing in the Last Year: No        The following sections were reviewed this encounter by the provider:   Tobacco   Allergies   Meds   Problems   Med Hx   Surg Hx   Fam Hx          Vitals:  BP 115/63 (BP Site: Left arm, Patient Position: Sitting, Cuff Size: Medium)    Pulse 79    Temp 97.5 F (36.4 C) (Temporal)    Wt 91.6 kg (202 lb)    SpO2 97%    BMI 30.71 kg/m       ROS:  General/Constitutional:   Denies Chills.Denies Fever.   Ophthalmologic:   Denies Blurred vision.   ENT:   Denies Nasal Discharge. Denies Sinus pain. Denies Sore throat.   Respiratory:   Denies Cough. Denies Shortness of breath. Denies Wheezing.   Cardiovascular:   Denies Chest pain. Denies Chest pain with exertion. Denies Palpitations. Denies  Swelling in hands/feet.   Gastrointestinal:   Denies Abdominal pain. Denies Constipation. Denies Diarrhea. Denies Nausea. Denies  Vomiting.   Skin:   Denies Rash.   Neurologic:   Denies Dizziness. Denies Headache. Denies Tingling/Numbness.        Objective:       Physical Exam:  General Examination:   GENERAL APPEARANCE: alert, in no acute distress, well developed, well nourished,  oriented to time, place, and person.   NECK/THYROID: neck supple, no carotid bruit, carotid pulse 2+ bilaterally, no cervical  lymphadenopathy, no neck mass palpated, no jugular venous distention, no  thyromegaly.   HEART: S1, S2 normal, no murmurs, rubs, gallops, regular rate and rhythm.   LUNGS: normal effort / no distress, normal breath sounds, clear to auscultation  bilaterally, no wheezes, rales, rhonchi.   EXTREMITIES: no clubbing, cyanosis, or edema bilaterally.   PERIPHERAL PULSES: 2+ dorsalis pedis, 2+ posterior tibial  bilaterally.   NEUROLOGIC: alert and oriented.        Assessment/Plan:       1. Essential hypertension  Pt stable on current medication regimen, no concerns. Continue current medication    2. Mixed hyperlipidemia  Pt stable on current medication regimen, no concerns. Continue current medication    3. Obesity (BMI 30-39.9)  HIgh BMI Follow Up  BMI Follow Up Care Plan Documented  Encouragement to Exercise    4. S/P laparoscopic sleeve gastrectomy  Lost 50 lbs   Follows with bariatric surgeon     5.Musculoskeletal back pain  Stretching   Regular exercise  Avoid bending or lifting    Morrison Old, MD

## 2021-08-29 ENCOUNTER — Telehealth (HOSPITAL_BASED_OUTPATIENT_CLINIC_OR_DEPARTMENT_OTHER): Payer: Medicare Other | Admitting: Family

## 2021-08-30 ENCOUNTER — Encounter (INDEPENDENT_AMBULATORY_CARE_PROVIDER_SITE_OTHER): Payer: Self-pay

## 2021-09-27 NOTE — Progress Notes (Signed)
 Self Swab Type: Anterior Nasal    Time of patient swab

## 2021-09-29 ENCOUNTER — Encounter (INDEPENDENT_AMBULATORY_CARE_PROVIDER_SITE_OTHER): Payer: Self-pay

## 2021-10-12 ENCOUNTER — Encounter (INDEPENDENT_AMBULATORY_CARE_PROVIDER_SITE_OTHER): Payer: Self-pay | Admitting: Family

## 2021-10-12 ENCOUNTER — Other Ambulatory Visit (INDEPENDENT_AMBULATORY_CARE_PROVIDER_SITE_OTHER): Payer: Self-pay | Admitting: Family

## 2021-10-12 ENCOUNTER — Ambulatory Visit (INDEPENDENT_AMBULATORY_CARE_PROVIDER_SITE_OTHER): Payer: Medicare Other | Admitting: Family

## 2021-10-12 VITALS — BP 143/77 | HR 67 | Temp 97.3°F | Resp 19 | Wt 200.0 lb

## 2021-10-12 DIAGNOSIS — R0781 Pleurodynia: Secondary | ICD-10-CM

## 2021-10-12 LAB — ECG 12-LEAD
Atrial Rate: 64 {beats}/min
IHS MUSE NARRATIVE AND IMPRESSION: NORMAL
P Axis: 66 degrees
P-R Interval: 206 ms
Q-T Interval: 414 ms
QRS Duration: 94 ms
QTC Calculation (Bezet): 427 ms
R Axis: 45 degrees
T Axis: 49 degrees
Ventricular Rate: 64 {beats}/min

## 2021-10-12 NOTE — Progress Notes (Signed)
[facility]    PROGRESS NOTE      Patient: Barbara Rubio   Date: 10/12/2021   MRN: [id number]     Past Medical History:   Diagnosis Date    Abnormal vision     wears glasses    Gastroesophageal reflux disease 10/23/2013    Acid reflux a lot after meals. TUMS, PRILOSEC    Hiatal hernia     repaired with surgery    Hyperlipidemia     Hypertension     checks BP at home: 128-130/70s    Left wrist fracture 2018    Psoriatic arthritis     LOWER BACK, LEFT HAND, WRIST AND FINGERS    Urinary incontinence 12/21/2017    Over active bladder-history     Social History     Socioeconomic History    Marital status: Divorced   Tobacco Use    Smoking status: Never    Smokeless tobacco: Never    Tobacco comments:     Never smoked or used tobacco   Vaping Use    Vaping Use: Never used   Substance and Sexual Activity    Alcohol use: Yes     Comment: once-twice a month    Drug use: Never   Other Topics Concern    Dietary supplements / vitamins Yes    Anesthesia problems No    Blood thinners No    Pregnant No    Future Children No    Number of Pregnancies? Yes     Comment: 1    Number of children Yes     Comment: 1    Miscarriages / Abortions? No    Eats large amounts No    Excessive Sweets Yes    Skips meals No    Eats excessive starches Yes    Snacks or grazes Yes    Emotional eater No    Eats fried food Yes    Eats fast food Yes    Diet Center No    Doylene Bode Yes    LA Weight Loss No    Nutri-System Yes    Opti-Fast / Medi-Fast No    Overeaters Anonymous No    [facility] No    TOPS No    Weight Watchers Yes    Atkins No    Binging / Purging No    Calorie Counting Yes    Fasting No    High Protein Yes    Low Carb Yes    Low Fat Yes    [facility] Diet No    Slim Fast No    [place] Yes    Stationary cycle or treadmill No    Gym/fitness Classes No    Home exercise/video No    Swimming No    Weight training No    Walking or running Yes    Hospitalization No    Hypnosis No    Physical  therapy No    Psychological therapy No    Residential program No    Acutrim No    [place] No    Contrave No    Dexatrim No    Diethylpropion No    Fastin No    Fen - Phen No    Ionamin / Adipex No    Phentermine No    Qsymia No    Prozac No    Saxenda No    Topamax No    Wellbutrin No    Xenical (Orlistat,  Alli) No    Other Med No    No impairment No    Walks with cane/crutch No    Requires a wheelchair No    Bedridden No    Are you currently being treated for depression? No    Do you snore? Yes    Are you receiving any medical or psychological services? No    Do you ever wake up at night gasping for breath? Yes    Do you have or have you been treated for an eating disorder? No    Anyone ever told you that you stop breathing while asleep? No    Do you exercise regularly? No    Have you or family member ever have trouble with anesthesia? Yes     Social Determinants of Health     Financial Resource Strain: Medium Risk    Difficulty of Paying Living Expenses: Somewhat hard   Food Insecurity: Food Insecurity Present    Worried About Programme researcher, broadcasting/film/video in the Last Year: Sometimes true    Ran Out of Food in the Last Year: Sometimes true   Transportation Needs: No Transportation Needs    Lack of Transportation (Medical): No    Lack of Transportation (Non-Medical): No   Physical Activity: Sufficiently Active    Days of Exercise per Week: 5 days    Minutes of Exercise per Session: 90 min   Stress: No Stress Concern Present    Feeling of Stress : Only a little   Social Connections: Moderately Integrated    Frequency of Communication with Friends and Family: More than three times a week    Frequency of Social Gatherings with Friends and Family: More than three times a week    Attends Religious Services: More than 4 times per year    Active Member of [employer] or Organizations: Yes    Attends Engineer, structural: More than 4 times per year    Marital Status: Divorced   Catering manager Violence: Not At Risk    Fear of  Current or Ex-Partner: No    Emotionally Abused: No    Physically Abused: No    Sexually Abused: No   Housing Stability: Low Risk     Unable to Pay for Housing in the Last Year: No    Number of Places Lived in the Last Year: 1    Unstable Housing in the Last Year: No     Family History   Problem Relation Age of Onset    Hypertension Mother     Hypertension Father     Hypertension Sister     Hypertension Brother     Migraines Neg Hx     Stroke Neg Hx     Seizures Neg Hx        ASSESSMENT/PLAN     Barbara Rubio is a 70 y.o. female    Chief Complaint   Patient presents with    Pain     Pt stated constant  Dull ache in Left rib (below the Left Breast) x a month and   morse worse x  3 days.Pain even at rest.        1. Rib pain on left side  - ECG 12 lead  - X-ray chest PA and lateral; Future    EKG shows sinus rhythm, no acute S-T/T changes    Chest x-ray: FINDINGS:   The heart is normal in size. The aorta is atherosclerotic and unfolded. The  pulmonary vascular  pattern is unremarkable.  There is minimal left basilar  atelectasis, lungs are otherwise clear. There is no effusion. There is no  pneumothorax.      IMPRESSION:    Basilar atelectasis, otherwise unremarkable chest examination.    Recommend deep breathing exercise. Continue with ibuprofen/tylenol to help with papin  Follow up for any new or worsening symptoms     Recent Results (from the past 24 hour(s))   ECG 12 lead    Collection Time: 10/12/21  9:12 AM   Result Value Ref Range    Ventricular Rate 64 BPM    Atrial Rate 64 BPM    P-R Interval 206 ms    QRS Duration 94 ms    Q-T Interval 414 ms    QTC Calculation (Bezet) 427 ms    P Axis 66 degrees    R Axis 45 degrees    T Axis 49 degrees    [facility] MUSE NARRATIVE AND IMPRESSION       NORMAL SINUS RHYTHM  NORMAL ECG  NO PREVIOUS ECGS AVAILABLE  Confirmed by Loistine Chance ([id number]) on 10/12/2021 3:47:09 PM           Risk & Benefits of the new medication(s) were explained to the patient (and family) who  verbalized understanding & agreed to the treatment plan. Patient (family) encouraged to contact me/clinical staff with any questions/concerns      MEDICATIONS     Outpatient Medications Marked as Taking for the 10/12/21 encounter (Office Visit) with Morene Crocker, FNP   Medication Sig Dispense Refill    atorvastatin (LIPITOR) 10 MG tablet TAKE 1 TABLET BY MOUTH EVERY DAY AT NIGHT 90 tablet 1    B Complex Vitamins (B-Complex/B-12) Tab Take 1 tablet by mouth daily Takes liquid      estradiol (ESTRACE) 0.1 MG/GM vaginal cream TWICE A WEEK        lisinopril (ZESTRIL) 5 MG tablet Take 1 tablet (5 mg total) by mouth daily 90 tablet 3    VITAMIN D PO Take 5,000 IU by mouth daily           No Known Allergies    SUBJECTIVE     Chief Complaint   Patient presents with    Pain     Pt stated constant  Dull ache in Left rib (below the Left Breast) x a month and   morse worse x  3 days.Pain even at rest.        Chest Pain   This is a new problem. Episode onset: 2 episodes in the past month. The onset quality is gradual. The problem occurs constantly. The problem has been unchanged. The pain is present in the epigastric region. The pain is at a severity of 3/10. The pain is mild. The quality of the pain is described as dull and burning. The pain does not radiate. Associated symptoms include a cough (occasional coughing) and shortness of breath (feels like she cannot take a full breath). Pertinent negatives include no abdominal pain, back pain, fever, headaches, nausea or vomiting. The pain is aggravated by deep breathing and movement. She has tried acetaminophen and rest for the symptoms. The treatment provided no relief. There are no known risk factors.   Her past medical history is significant for hyperlipidemia and hypertension.     Had similar pain around Thanksgiving, under left breast extending to left flank and sometimes extending across to under right breast. Pain lasted 3-4 days. Got better after taking Tylenol/Advil with  improvement.     2 days ago pain started again. Improves with tylenol/advil. Taking deep breaths does increase pain. At night she has to sleep on her right side due to discomfort putting pressure on the area.     +hx of Bariatric surgery. Does not have BMs as often due to this. Had colonoscopy over the summer, reports it was normal.   ROS     Review of Systems   Constitutional:  Negative for appetite change, chills, fatigue and fever.   Respiratory:  Positive for cough (occasional coughing), chest tightness and shortness of breath (feels like she cannot take a full breath). Negative for wheezing.    Cardiovascular:  Positive for chest pain.   Gastrointestinal:  Negative for abdominal pain, blood in stool, diarrhea, nausea and vomiting.   Musculoskeletal:  Negative for back pain and myalgias.   Skin:  Negative for rash.   Neurological:  Negative for headaches.      The following sections were reviewed this encounter by the provider:   Tobacco   Allergies   Meds   Problems   Med Hx   Surg Hx   Fam Hx          PHYSICAL EXAM     Vitals:    10/12/21 0850   BP: 143/77   BP Site: Left arm   Patient Position: Sitting   Cuff Size: Medium   Pulse: 67   Resp: 19   Temp: 97.3 F (36.3 C)   TempSrc: Temporal   SpO2: 97%   Weight: 90.7 kg (200 lb)       Physical Exam  Vitals and nursing note reviewed.   Constitutional:       General: She is not in acute distress.     Appearance: She is well-developed. She is not diaphoretic.   HENT:      Head: Normocephalic and atraumatic.   Eyes:      Pupils: Pupils are equal, round, and reactive to light.   Cardiovascular:      Rate and Rhythm: Normal rate and regular rhythm.      Heart sounds: Normal heart sounds. No murmur heard.    No friction rub. No gallop.   Pulmonary:      Effort: Pulmonary effort is normal. No respiratory distress.      Breath sounds: Normal breath sounds. No decreased breath sounds, wheezing, rhonchi or rales.   Skin:     General: Skin is warm and dry.   Neurological:       Mental Status: She is alert and oriented to person, place, and time.   Psychiatric:         Behavior: Behavior normal.         Thought Content: Thought content normal.         Judgment: Judgment normal.     Ortho Exam  Neurologic Exam     Mental Status   Oriented to person, place, and time.     Cranial Nerves     CN III, IV, VI   Pupils are equal, round, and reactive to light.    PROCEDURE(S)     Procedures        Signed,  Morene Crocker, FNP  10/12/2021

## 2021-10-12 NOTE — Progress Notes (Signed)
I called and spoke with Mrs. Barbara Rubio and reviewed result of chest x-ray which shows left basilar atelectasis - reviewed she needs to be taking 10 deep breaths every hour while awake. Discussed this is likely due to not deep breathing from recent pain. If there is any fever, shortness of breath, cough or worsening symptoms seek prompt follow up.

## 2021-10-23 DIAGNOSIS — U071 COVID-19: Secondary | ICD-10-CM

## 2021-10-23 HISTORY — DX: COVID-19: U07.1

## 2021-10-28 ENCOUNTER — Ambulatory Visit (INDEPENDENT_AMBULATORY_CARE_PROVIDER_SITE_OTHER): Payer: Medicare Other | Admitting: Internal Medicine

## 2021-10-30 ENCOUNTER — Encounter (INDEPENDENT_AMBULATORY_CARE_PROVIDER_SITE_OTHER): Payer: Self-pay

## 2021-11-23 ENCOUNTER — Encounter (INDEPENDENT_AMBULATORY_CARE_PROVIDER_SITE_OTHER): Payer: Self-pay | Admitting: Family Medicine

## 2021-11-23 ENCOUNTER — Telehealth (INDEPENDENT_AMBULATORY_CARE_PROVIDER_SITE_OTHER): Payer: Medicare Other | Admitting: Family Medicine

## 2021-11-23 DIAGNOSIS — J0100 Acute maxillary sinusitis, unspecified: Secondary | ICD-10-CM

## 2021-11-23 DIAGNOSIS — U071 COVID-19: Secondary | ICD-10-CM

## 2021-11-23 MED ORDER — AZITHROMYCIN 250 MG PO TABS
ORAL_TABLET | ORAL | 0 refills | Status: AC
Start: 2021-11-23 — End: 2021-11-29

## 2021-11-23 NOTE — Progress Notes (Unsigned)
[facility]                       Date of Exam: 11/23/2021 4:24 PM        Patient ID: Barbara Rubio is a 71 y.o. female.  Attending Physician: Esmeralda Links, MD        Chief Complaint:    Chief Complaint   Patient presents with    Nasal Congestion     COVID + for 10 days now. Still has issues and still testing positive, fever for first 4-5 days, hard to breathe, night sweats, productive cough.         Verbal consent has been obtained from the patient to conduct this video visit encounter. Pt is present in [place] at the time of this visit.       HPI:    HPI Pt able to visit reports feeling tired nasal congestion, sinus pain and sinus pressure.  Tested positive for COVID 10 days ago, and tested again for COVID there is positive. Initially had fever chills and not better.  Now having productive cough, hard to breathe, denies SOB, wheezing.   Taking Tylenol, Mucinex, NyQuil, trying to drink more water, has been sleeping a lot.  Quickly gets tired doing things around the house.  Denies chest pain, no dizziness, no nausea vomiting, no abdominal pain, no joint swellings             Problem List:    Patient Active Problem List   Diagnosis    Essential hypertension    Mixed hyperlipidemia    Pain in both hands    Gastroesophageal reflux disease    Hiatal hernia    Vitamin D deficiency    S/P laparoscopic sleeve gastrectomy    Obesity (BMI 30-39.9)    Screen for colon cancer             Current Meds:    Outpatient Medications Marked as Taking for the 11/23/21 encounter (Telemedicine Visit) with Esmeralda Links, MD   Medication Sig Dispense Refill    atorvastatin (LIPITOR) 10 MG tablet TAKE 1 TABLET BY MOUTH EVERY DAY AT NIGHT 90 tablet 1    B Complex Vitamins (B-Complex/B-12) Tab Take 1 tablet by mouth daily Takes liquid      estradiol (ESTRACE) 0.1 MG/GM vaginal cream TWICE A WEEK        lisinopril (ZESTRIL) 5 MG tablet Take 1 tablet (5 mg total) by mouth daily 90 tablet 3    VITAMIN D  PO Take 5,000 IU by mouth daily            Allergies:    No Known Allergies          Past Surgical History:    Past Surgical History:   Procedure Laterality Date    BUNIONECTOMY Left     COLONOSCOPY      X 2     COLONOSCOPY N/A 04/13/2021    Procedure: COLONOSCOPY;  Surgeon: Franki Monte, MD;  Location: [id number];  Service: Gastroenterology;  Laterality: N/A;    EGD N/A 05/18/2020    Procedure: EGD w/ bx's;  Surgeon: Sabas Sous, MD;  Location: [facility];  Service: Gastroenterology;  Laterality: N/A;    EGD  12/2020    HYSTERECTOMY       partial    LAPAROSCOPIC GASTRECTOMY SLEEVE WITH HIATAL HERNIA N/A 07/28/2020    Procedure: LAPAROSCOPIC GASTRECTOMY SLEEVE WITH HIATAL HERNIA, OMENTOPEXY,  LAPAROSCOPIC RESECTION OF ANTRAL AND FUNDIC GASTRIC MASS;  Surgeon: Nicola Police, DO;  Location: [facility];  Service: General;  Laterality: N/A;           Family History:    Family History   Problem Relation Age of Onset    Hypertension Mother     Hypertension Father     Hypertension Sister     Hypertension Brother     Migraines Neg Hx     Stroke Neg Hx     Seizures Neg Hx            Social History:    Social History     Tobacco Use    Smoking status: Never    Smokeless tobacco: Never    Tobacco comments:     Never smoked or used tobacco   Vaping Use    Vaping Use: Never used   Substance Use Topics    Alcohol use: Yes     Comment: once-twice a month    Drug use: Never           The following sections were reviewed this encounter by the provider:   Tobacco  Allergies  Meds  Problems  Med Hx  Surg Hx  Fam Hx             Vital Signs:    There were no vitals taken for this visit.         ROS:    Review of Systems   General/Constitutional:   Denies Chills. Denies Fatigue.   Respiratory:   Denies Cough. SOB   Cardiovascular:   Denies Chest pain or Palpitations.   Gastrointestinal:   Denies Abdominal pain, Nausea/Vomiting  Neurologic:   Denies Dizziness. Denies Headache.    Psych- Denies  anxiety, depression, difficulty sleeping             Physical Exam:    Physical Exam   GENERAL APPEARANCE: alert, in no acute distress, well developed        Assessment/Plan:     1. COVID-19    2. Acute maxillary sinusitis, recurrence not specified  - azithromycin (ZITHROMAX) 250 MG tablet; Take 2 tablets (500 mg) on  Day 1,  followed by 1 tablet (250 mg) once daily on Days 2 through 5.  Dispense: 6 tablet; Refill: 0    -Recommend self isolation/quarantine for 5 days and 10 days of masking  -Recommend taking vitamin C, Zinc 50 or 25 mg daily and Vit D3 5000 IU daiy until feeling better   -Rest at home and push fluids, warm beverages including warm teas and soups,   - gargle throat with salt and water   - tylenol and Ibuprofen prn for pain and fever prn   - Continue to take dayquil and nyquil may switch or musinex  - Recommend further evaluation if the following symptoms develop: worsening of symptoms, fever, shortness of breath, persistent cough, wheezing or developing a new sx.             Follow-up:    Return if symptoms worsen or fail to improve.         Esmeralda Links, MD

## 2021-11-23 NOTE — Progress Notes (Unsigned)
Have you seen any specialists since your last visit with us?  No      The patient was informed that the following HM items are still outstanding:   shingles vaccine

## 2021-11-30 ENCOUNTER — Encounter (INDEPENDENT_AMBULATORY_CARE_PROVIDER_SITE_OTHER): Payer: Self-pay

## 2021-12-22 ENCOUNTER — Other Ambulatory Visit (INDEPENDENT_AMBULATORY_CARE_PROVIDER_SITE_OTHER): Payer: Self-pay | Admitting: Internal Medicine

## 2021-12-22 DIAGNOSIS — E782 Mixed hyperlipidemia: Secondary | ICD-10-CM

## 2021-12-28 ENCOUNTER — Encounter (INDEPENDENT_AMBULATORY_CARE_PROVIDER_SITE_OTHER): Payer: Self-pay

## 2022-01-09 ENCOUNTER — Encounter (INDEPENDENT_AMBULATORY_CARE_PROVIDER_SITE_OTHER): Payer: Self-pay | Admitting: Family

## 2022-01-09 ENCOUNTER — Other Ambulatory Visit: Payer: Self-pay

## 2022-01-09 ENCOUNTER — Encounter (INDEPENDENT_AMBULATORY_CARE_PROVIDER_SITE_OTHER): Payer: Self-pay

## 2022-01-09 DIAGNOSIS — R1012 Left upper quadrant pain: Secondary | ICD-10-CM

## 2022-01-09 DIAGNOSIS — K5909 Other constipation: Secondary | ICD-10-CM | POA: Insufficient documentation

## 2022-01-16 ENCOUNTER — Ambulatory Visit: Payer: Medicare Other

## 2022-01-16 VITALS — Ht 69.0 in | Wt 199.0 lb

## 2022-01-16 DIAGNOSIS — Z01818 Encounter for other preprocedural examination: Secondary | ICD-10-CM

## 2022-01-16 NOTE — Pre-Procedure Instructions (Addendum)
Important Instructions Before Your Procedure        Your case is currently scheduled for 01/24/2022 at 1200 with Annamaria Helling D, MD.      The date and/or time of your surgery may change.  The OR Control Desk from [facility] will notify you, up until the business day before surgery, if there is any change to your surgery date or time.      CBC/BMP LABS:  As we discussed, orders have been placed for you to complete your CBC/CMP labs at the [facility] lab on 01/17/22.  The Lab is open from 8:00am - 4:30pm  and no appointment is necessary.    You will need to bring your photo ID and insurance card when you come.     YOU SHOULD NOT TAKE IBUPROFEN, MOTRIN, ADVIL, ALEVE, NSAIDS OR ASPIRIN AS OF 01/17/22.     YOU MAY TAKE TYLENOL AS NEEDED.    Discontinue herbal supplements and herbal/green tea as of  01/17/22.    EGD / COLONOSCOPY PROCEDURES:  PLEASE FOLLOW THE DIETARY GUIDELINES AND/OR BOWEL PREP INSTRUCTIONS PROVIDED BY YOUR SURGEON'S OFFICE.     Bathe or shower the morning of the procedure with an anti-bacterial soap before arriving (unless instructed to use Chlorhexidine (CHG) or Hibiclens).      IMPORTANT You must visit the Preparing for Your Procedure guide link below for additional instructions including fasting guidelines and directions before your procedure. If you received fasting or skin preparation instructions from your surgeon or pre-procedural provider, please follow those specific instructions. The instructions here are general instructions that do not pertain to all patients.  [email or web address]    If the link doesn't open, please copy and paste in your browser    QUESTIONS?  If you have any questions about your pre-procedural evaluation, please call the clinic location where your appointment was performed:     [facility]: [phone]  [facility]: [phone]  [facility]: [phone]  [place]: [phone]  [place]: [phone]

## 2022-01-16 NOTE — Addendum Note (Signed)
Addended by: Sinclair Grooms on: 01/16/2022 01:53 PM     Modules accepted: Orders

## 2022-01-16 NOTE — PSS Phone Screening (Addendum)
Pre-Anesthesia Evaluation    Pre-op phone visit requested by:   Reason for pre-op phone visit: Patient anticipating EGD procedure.    No orders of the defined types were placed in this encounter.      History of Present Illness/Summary:        Problem List:  Medical Problems       Hospital Problem List  Date Reviewed: 11/23/2021   None        Non-Hospital Problem List  Date Reviewed: 11/23/2021            ICD-10-CM Priority Class Noted    Essential hypertension I10   Unknown    Mixed hyperlipidemia E78.2   Unknown    Pain in both hands M79.641, M79.642   04/05/2018    Gastroesophageal reflux disease K21.9   03/16/2020    Hiatal hernia K44.9   06/08/2020    Vitamin D deficiency E55.9   07/20/2020    S/P laparoscopic sleeve gastrectomy Z98.84   07/29/2020    Obesity (BMI 30-39.9) E66.9   01/07/2021    Screen for colon cancer Z12.11   03/07/2021    Overview Signed 03/07/2021  1:32 PM by Leonie Douglas, MA     Added automatically from request for surgery [phone]         LUQ abdominal pain R10.12   01/09/2022    Overview Signed 01/09/2022 12:19 PM by Bridgett Larsson     Added automatically from request for surgery [phone]         Chronic constipation K59.09   01/09/2022    Overview Signed 01/09/2022 12:19 PM by Bridgett Larsson     Added automatically from request for surgery [phone]             Medical History   Diagnosis Date    Abnormal vision     wears glasses    Constipation     HX - HAS BEEN MORE PREVELANT POST BARIATRIC SURGERY.    COVID-19 10/2021    HX - 21//23 URGENT CARE NOTE IN EPIC.    Fibroids     PT HAD A PARTIAL HYSTERECTOMY - OVARIES ARE STILL IN PLACE.    Gastroesophageal reflux disease 10/23/2013    HX.    Hyperlipidemia     MANAGED WITH MEDS.    Hypertension     PT STATES HE BP RUNS FROM 128-130/70s. MANAGED WITH MEDS - FOLLOWED BY PCP.    Left wrist fracture 2018    HX    LUQ pain     HX    Psoriatic arthritis     HX - LOWER BACK, LEFT HAND, WRIST AND FINGERS    Urinary incontinence 12/21/2017    HX OVERACTIVE  BLADDER. ISSUE RESOLVED POST BARIATRIC SURGERY - PT STATED SHE LOST 65 LBS.     Past Surgical History:   Procedure Laterality Date    BUNIONECTOMY Left     COLONOSCOPY      X 2     COLONOSCOPY N/A 04/13/2021    Procedure: COLONOSCOPY;  Surgeon: Franki Monte, MD;  Location: [facility];  Service: Gastroenterology;  Laterality: N/A;    EGD N/A 05/18/2020    Procedure: EGD w/ bx's;  Surgeon: Sabas Sous, MD;  Location: [facility];  Service: Gastroenterology;  Laterality: N/A;    EGD  12/2020    HYSTERECTOMY      PARTIAL - OVARIES IN PLACE.    LAPAROSCOPIC GASTRECTOMY SLEEVE WITH HIATAL HERNIA N/A 07/28/2020    Procedure: LAPAROSCOPIC  GASTRECTOMY SLEEVE WITH HIATAL HERNIA, OMENTOPEXY, LAPAROSCOPIC RESECTION OF ANTRAL AND FUNDIC GASTRIC MASS;  Surgeon: Nicola Police, DO;  Location: [facility];  Service: General;  Laterality: N/A;        Medication List            Accurate as of January 16, 2022  1:25 PM. Always use your most recent med list.                atorvastatin 10 MG tablet  TAKE 1 TABLET BY MOUTH EVERY DAY AT NIGHT  Commonly known as: LIPITOR  Notes to patient: TAKES DOSE AT NIGHT.     B-Complex/B-12 Tabs  Take 1 tablet by mouth daily 5000 MCG  Medication Adjustments for Surgery: Hold day of surgery     estradiol 0.1 MG/GM vaginal cream  1 g twice a week TWICE A WEEK - USES DOSE MONDAYS AND WEDNESDAYS  Commonly known as: ESTRACE  Notes to patient: TAKES DOSE MONDAYS AND WEDNESDAYS.     lisinopril 5 MG tablet  Take 1 tablet (5 mg total) by mouth daily  Commonly known as: ZESTRIL  Notes to patient: TAKES DOSE AT NIGHT.     multivitamin capsule  Take 1 capsule by mouth daily  Medication Adjustments for Surgery: Hold day of surgery     Vitamin C 500 MG Caps  Take 2 capsules by mouth daily  Medication Adjustments for Surgery: Hold day of surgery     vitamin E 200 UNIT capsule  Take 200 Units by mouth daily  Medication Adjustments for Surgery: Hold day of surgery     zinc gluconate 50 MG  tablet  Take 50 mg by mouth daily  Medication Adjustments for Surgery: Hold day of surgery            No Known Allergies  Family History   Problem Relation Age of Onset    Hypertension Mother     Hypertension Father     Hypertension Sister     Hypertension Brother     Migraines Neg Hx     Stroke Neg Hx     Seizures Neg Hx      Social History     Occupational History    Not on file   Tobacco Use    Smoking status: Never    Smokeless tobacco: Never    Tobacco comments:     Never smoked or used tobacco   Vaping Use    Vaping status: Never Used   Substance and Sexual Activity    Alcohol use: Yes     Comment: 1-2 DRINKS MONTHLY    Drug use: Never    Sexual activity: Not on file       Menstrual History:   LMP / Status  Hysterectomy     No LMP recorded. Patient has had a hysterectomy.    Tubal Ligation?  No valid surgical or medical questions entered.             Exam Scores:   SDB score Risk Category: No Risk    PONV score Nausea Risk: MODERATE RISK    MST score MST Score: 0    Allergy score Risk Category: Low Risk    Frailty score CFS Score: 2       Visit Vitals  Ht 1.753 m (5\' 9" )   Wt 90.3 kg (199 lb)   BMI 29.39 kg/m     HX BARIATRIC SURGERY 05/2021 - PT STATED SHE LOST 65 LBS.  06/10/21 NOTE IN Epic.    05/2021 LABS IN Epic. PT WILL REPEAT LABS ON 01/17/22 AT [facility] OUTPATIENT LAB (STATED SHE WAS ADVISED BY SURGEON'S OF[facility] THAT LABS MUST BE DONE WITHIN 6 MONTHS).

## 2022-01-17 ENCOUNTER — Ambulatory Visit
Admission: RE | Admit: 2022-01-17 | Discharge: 2022-01-17 | Disposition: A | Discharge status: Home / Self Care | Payer: Medicare Other | Source: Ambulatory Visit | Attending: Anesthesiology | Admitting: Anesthesiology

## 2022-01-17 DIAGNOSIS — Z01818 Encounter for other preprocedural examination: Secondary | ICD-10-CM | POA: Insufficient documentation

## 2022-01-17 LAB — BASIC METABOLIC PANEL
Anion Gap: 9 (ref 5.0–15.0)
BUN: 12 mg/dL (ref 7.0–21.0)
CO2: 26 mEq/L (ref 17–29)
Calcium: 9.2 mg/dL (ref 8.5–10.5)
Chloride: 110 mEq/L (ref 99–111)
Creatinine: 0.8 mg/dL (ref 0.4–1.0)
Glucose: 86 mg/dL (ref 70–100)
Potassium: 4.5 mEq/L (ref 3.5–5.3)
Sodium: 145 mEq/L (ref 135–145)

## 2022-01-17 LAB — CBC AND DIFFERENTIAL
Absolute NRBC: 0 10*3/uL (ref 0.00–0.00)
Basophils Absolute Automated: 0.04 10*3/uL (ref 0.00–0.08)
Basophils Automated: 0.8 %
Eosinophils Absolute Automated: 0.07 10*3/uL (ref 0.00–0.44)
Eosinophils Automated: 1.4 %
Hematocrit: 37.6 % (ref 34.7–43.7)
Hgb: 11.3 g/dL — ABNORMAL LOW (ref 11.4–14.8)
Immature Granulocytes Absolute: 0.01 10*3/uL (ref 0.00–0.07)
Immature Granulocytes: 0.2 %
Instrument Absolute Neutrophil Count: 2.34 10*3/uL (ref 1.10–6.33)
Lymphocytes Absolute Automated: 2.01 10*3/uL (ref 0.42–3.22)
Lymphocytes Automated: 41.5 %
MCH: 27 pg (ref 25.1–33.5)
MCHC: 30.1 g/dL — ABNORMAL LOW (ref 31.5–35.8)
MCV: 90 fL (ref 78.0–96.0)
MPV: 12.4 fL (ref 8.9–12.5)
Monocytes Absolute Automated: 0.37 10*3/uL (ref 0.21–0.85)
Monocytes: 7.6 %
Neutrophils Absolute: 2.34 10*3/uL (ref 1.10–6.33)
Neutrophils: 48.5 %
Nucleated RBC: 0 /100 WBC (ref 0.0–0.0)
Platelets: 182 10*3/uL (ref 142–346)
RBC: 4.18 10*6/uL (ref 3.90–5.10)
RDW: 14 % (ref 11–15)
WBC: 4.84 10*3/uL (ref 3.10–9.50)

## 2022-01-17 LAB — HEMOLYSIS INDEX: Hemolysis Index: 3 Index (ref 0–24)

## 2022-01-17 LAB — GFR: EGFR: >60

## 2022-01-24 ENCOUNTER — Ambulatory Visit: Payer: Medicare Other | Admitting: Anesthesiology

## 2022-01-24 ENCOUNTER — Ambulatory Visit: Payer: Self-pay

## 2022-01-24 ENCOUNTER — Encounter
Admission: RE | Disposition: A | Discharge status: Home / Self Care | Payer: Self-pay | Source: Ambulatory Visit | Attending: Internal Medicine

## 2022-01-24 ENCOUNTER — Encounter: Payer: Self-pay | Admitting: Internal Medicine

## 2022-01-24 ENCOUNTER — Ambulatory Visit
Admission: RE | Admit: 2022-01-24 | Discharge: 2022-01-24 | Disposition: A | Discharge status: Home / Self Care | Payer: Medicare Other | Source: Ambulatory Visit | Attending: Internal Medicine | Admitting: Internal Medicine

## 2022-01-24 DIAGNOSIS — Z79899 Other long term (current) drug therapy: Secondary | ICD-10-CM | POA: Insufficient documentation

## 2022-01-24 DIAGNOSIS — R1012 Left upper quadrant pain: Secondary | ICD-10-CM

## 2022-01-24 DIAGNOSIS — K2950 Unspecified chronic gastritis without bleeding: Secondary | ICD-10-CM | POA: Insufficient documentation

## 2022-01-24 DIAGNOSIS — Z79890 Hormone replacement therapy (postmenopausal): Secondary | ICD-10-CM | POA: Insufficient documentation

## 2022-01-24 DIAGNOSIS — Z9884 Bariatric surgery status: Secondary | ICD-10-CM | POA: Insufficient documentation

## 2022-01-24 DIAGNOSIS — K5909 Other constipation: Secondary | ICD-10-CM | POA: Insufficient documentation

## 2022-01-24 HISTORY — PX: EGD: SHX3789

## 2022-01-24 SURGERY — DONT USE, USE 1095-ESOPHAGOGASTRODUODENOSCOPY (EGD), DIAGNOSTIC
Anesthesia: Anesthesia General | Site: Esophagus | Wound class: Clean Contaminated

## 2022-01-24 MED ORDER — PROPOFOL INFUSION 10 MG/ML
INTRAVENOUS | Status: DC | PRN
Start: 2022-01-24 — End: 2022-01-24
  Administered 2022-01-24: 150 mg via INTRAVENOUS
  Administered 2022-01-24: 50 mg via INTRAVENOUS

## 2022-01-24 MED ORDER — LIDOCAINE HCL 2 % IJ SOLN
INTRAMUSCULAR | Status: DC | PRN
Start: 2022-01-24 — End: 2022-01-24
  Administered 2022-01-24: 100 mg via INTRAVENOUS

## 2022-01-24 MED ORDER — LIDOCAINE 1% BUFFERED - CNR/OUTSOURCED
0.3000 mL | Freq: Once | INTRAMUSCULAR | Status: AC
Start: 2022-01-24 — End: 2022-01-24
  Administered 2022-01-24: 0.3 mL via INTRADERMAL
  Filled 2022-01-24: qty 1

## 2022-01-24 MED ORDER — LACTATED RINGERS IV SOLN
INTRAVENOUS | Status: DC
Start: 2022-01-24 — End: 2022-01-24
  Administered 2022-01-24: 1000 mL via INTRAVENOUS

## 2022-01-24 MED ORDER — PROPOFOL 10 MG/ML IV EMUL (WRAP)
INTRAVENOUS | Status: AC
Start: 2022-01-24 — End: ?
  Filled 2022-01-24: qty 40

## 2022-01-24 SURGICAL SUPPLY — 117 items
BASIN EME PLS 700ML LF GRAD TRNLU PGMNT (Patient Supply)
BASIN EMESIS 700 ML GRADUATED TRANSLUCENT PIGMENT FREE PLASTIC (Patient Supply) IMPLANT
BLOCK BITE OD20 MM POLYETHYLENE ADULT (Patient Supply) ×1
BLOCK BITE OD20 MM POLYETHYLENE ADULT MOUTHPIECE STRAP RETENTION RIM (Patient Supply) ×1 IMPLANT
BLOCK BITE PE LG SCPSVR 20MM 27MM LF NS (Patient Supply) ×1
BRUSH ENDOSCOPIC CLEANING SLIM 2 BUNDLE (Endoscopic Supplies) ×1
BRUSH ENDOSCOPIC CLEANING SLIM 2 BUNDLE CATHETER ROBUST VALVE OD5 MM (Endoscopic Supplies) ×1 IMPLANT
BRUSH ESCP CLN SLIM HEDGEHOG 5MM 2 BNDL (Endoscopic Supplies) ×1
CATHETER BALLOON DILATATION CRE 2.8 MM (Balloons)
CATHETER BALLOON DILATATION CRE PEBAX (Balloons)
CATHETER BALLOON DILATATION L5.5 CM L240 (Balloons)
CATHETER BLNDIL PEBAX CRE 7.5FR 12-15MM (Balloons)
CATHETER BLNDIL PEBAX CRE 7.5FR 15-18MM (Balloons)
CATHETER ELHMST HMGLD GLDPRB 7FR 300CM (Procedure Accessories)
CATHETER ELHMST INJ GLDPRB 7FR 25GA .24 (Catheter Micellaneous)
CATHETER OD10-11-12 MM ODSEC6 FR L180 CM CRE BALLOON DILATATION L8 CM (Balloons) IMPLANT
CATHETER OD10-11-12 MM ODSEC6 FR L180 CM CREâ„¢ BALLOON DILATATION L8 CM (Balloons) IMPLANT
CATHETER OD18-19-20 MM ODSEC6 FR L180 CM CRE BALLOON DILATATION L8 CM (Balloons) IMPLANT
CATHETER OD18-19-20 MM ODSEC6 FR L180 CM CREâ„¢ BALLOON DILATATION L8 CM (Balloons) IMPLANT
CATHETER OD6 FR ODSEC12-13.5-15 MM L180 CM CRE BALLOON DILATATION L8 (Balloons) IMPLANT
CATHETER OD6 FR ODSEC12-13.5-15 MM L180 CM CREâ„¢ BALLOON DILATATION L8 (Balloons) IMPLANT
CATHETER OD6-7-8 MM ODSEC6 FR L180 CM CRE BALLOON DILATATION L8 CM (Balloons) IMPLANT
CATHETER OD6-7-8 MM ODSEC6 FR L180 CM CREâ„¢ BALLOON DILATATION L8 CM (Balloons) IMPLANT
CATHETER OD7 FR L300 CM BIPOLAR ROUND (Procedure Accessories)
CATHETER OD7 FR L300 CM BIPOLAR ROUND DISTAL TIP STANDARD CONNECTOR (Procedure Accessories) IMPLANT
CATHETER OD7 FR ODSEC25 GA ID.24 MM L210 (Catheter Micellaneous)
CATHETER OD7 FR ODSEC25 GA ID.24 MM L210 CM BIPOLAR ROUND DISTAL TIP (Catheter Miscellaneous) IMPLANT
CATHETER OD7.5 FR ODSEC12-15 MM L240 CM CRE BALLOON DILATATION L5.5 (Balloons) IMPLANT
CATHETER OD7.5 FR ODSEC12-15 MM L240 CM CREâ„¢ BALLOON DILATATION L5.5 (Balloons) IMPLANT
CATHETER OD7.5 FR ODSEC15-18 MM L240 CM CRE BALLOON DILATATION L5.5 (Balloons) IMPLANT
CATHETER OD7.5 FR ODSEC15-18 MM L240 CM CREâ„¢ BALLOON DILATATION L5.5 (Balloons) IMPLANT
CATHETER OD8-9-10 MM ODSEC6 FR L180 CM CRE BALLOON DILATATION L8 CM (Balloons) IMPLANT
CATHETER OD8-9-10 MM ODSEC6 FR L180 CM CREâ„¢ BALLOON DILATATION L8 CM (Balloons) IMPLANT
CATHETER OD8-9-10 MM ODSEC7.5 FR L240 CM CRE BALLOON DILATATION L5.5 (Balloons) IMPLANT
CATHETER OD8-9-10 MM ODSEC7.5 FR L240 CM CREâ„¢ BALLOON DILATATION L5.5 (Balloons) IMPLANT
CONTAINER HISTOLOGY 60 ML 30 ML GRADUATE LEAK RESISTANT O RING PREFILL (Procedure Accessories) IMPLANT
DEVICE INFL CRE STERI-FLATE DISP (Endoscopic Supplies)
DEVICE INFLATION CRE (Endoscopic Supplies)
DEVICE INFLATION CRE STERI-FLATE (Endoscopic Supplies) IMPLANT
DILATOR ENDOSCOPIC CRE 2.8 MM 3.2 MM (Balloons)
DILATOR ENDOSCOPIC CRE 2.8 MM 3.2 MM PEBAX ESOPHAGEAL PYLORIC COLONIC (Balloons) IMPLANT
DILATOR ENDOSCOPIC CRE 5.5C 240CM 18-19-20MM 7.5FR PEBAX ESOPHAGEAL (Balloons) IMPLANT
DILATOR ENDOSCOPIC CRE 5.5C 240CM 6-7-8MM 7.5FR PEBAX ESOPHAGEAL (Balloons) IMPLANT
DILATOR ENDOSCOPIC CRE PEBAX ESOPHAGEAL (Balloons)
DILATOR ESCP PEBAX 2.8MM 3.2MM CRE (Balloons)
DILATOR ESCP PEBAX 2.8MM 3.2MM CRE 6-7-8 (Balloons)
DILATOR ESCP PEBAX 2.8MM CRE 10-11-12MM (Balloons)
DILATOR ESCP PEBAX 2.8MM CRE 18-19-20MM (Balloons)
DILATOR ESCP PEBAX 2.8MM CRE 6-7-8MM 6FR (Balloons)
DILATOR ESCP PEBAX 2.8MM CRE 8-9-10MM 6 (Balloons)
DILATOR ESCP PEBAX CRE 6FR 12-13.5-15MM (Balloons)
DILATOR ESCP PEBAX CRE 8-9-10MM 7.5FR (Balloons)
ELECTRODE ADULT PATIENT RETURN L9 FT REM POLYHESIVE ACRYLIC FOAM (Procedure Accessories) IMPLANT
ELECTRODE PATIENT RETURN L9 FT VALLEYLAB (Procedure Accessories)
ELECTRODE PT RTN RM PHSV ACRL FM C30- LB (Procedure Accessories)
FORCEPS BIOPSY L240 CM +2.8 MM HOT OD2.2 (Endoscopic Supplies)
FORCEPS BIOPSY L240 CM +2.8 MM HOT OD2.2 MM RADIAL JAW (Endoscopic Supplies) IMPLANT
FORCEPS BIOPSY L240 CM JUMBO MICROMESH (Instrument)
FORCEPS BIOPSY L240 CM JUMBO MICROMESH TEETH STREAMLINE CATHETER (Instrument) IMPLANT
FORCEPS BIOPSY L240 CM LARGE CAPACITY (Instrument)
FORCEPS BIOPSY L240 CM MICROMESH TEETH STREAMLINE CATHETER NEEDLE (Instrument) IMPLANT
FORCEPS BIOPSY L240 CM STANDARD CAPACITY (Instrument) ×1
FORCEPS BX +2.8MM RJ 4 2.2MM 240CM HOT (Endoscopic Supplies)
FORCEPS BX SS JMB RJ 4 2.8MM 240CM STRL (Instrument)
FORCEPS BX SS LG CPC RJ 4 2.4MM 240CM (Instrument)
FORCEPS BX STD CPC RJ 4 2.2MM 240CM STRL (Instrument) ×1
GOWN ISL SMS XL LF HKLP NK KNIT CUF BLU (Patient Supply) ×2
GOWN ISOLATION XL HOOK LOOP NECK KNIT (Patient Supply) ×2 IMPLANT
KIT ENDO W/ FOUR PACK BUTTONS (Kits) ×1
KIT ENDOSCOPIC COMPLIANCE ENDOKIT (Kits) ×1
KIT ENDOSCOPIC COMPLIANCE ENDOKIT ORCAPOD 4 1.1 OZ (Kits) ×1 IMPLANT
LIFTER SRG 10ML ELEVIEW 5X5.1X1.2IN INJ (Endoscopic Supplies) IMPLANT
MANIFOLD SCT 2 STD NPTN 2 LF NS 4 PORT (Filter) ×1
MANIFOLD SUCTION 2 STANDARD 4 PORT (Filter) ×1
MANIFOLD SUCTION 2 STANDARD 4 PORT NEPTUNE 2 WASTE MANAGEMENT SYSTEM (Filter) ×1 IMPLANT
MARKER ENDOSCOPIC PERMANENT INDICATION (Syringes, Needles)
MARKER ENDOSCOPIC PERMANENT INDICATION DARK SYRINGE SPOT EX 5 ML (Syringes, Needles) IMPLANT
MARKER ESCP 5ML SPOT EX PERM INDCT DRK (Syringes, Needles)
NEEDLE SCLEROTHERAPY CARR-LOCKE OD25 GA ODSEC2.5 MM L230 CM INJECTION (Needles) IMPLANT
NEEDLE SCLEROTHERAPY OD25 GA ODSEC2.5 MM (Needles)
NEEDLE SCLRTX SS TFLN CRLK 25GA 2.5MM (Needles)
NET SPEC RTRVL STD RTHNT 2.5MM 230CM LF (Urology Supply)
NET SPECIMEN RETRIEVAL L230 CM STANDARD (Urology Supply)
NET SPECIMEN RETRIEVAL L230 CM STANDARD SHEATH OD2.5 MM L6 CM X W3 CM (Urology Supply) IMPLANT
PAD TRANSPORT L42 IN X W17 IN (Procedure Accessories)
PAD TRANSPORT L42 IN X W17 IN TRANSLUCENT LEAK PROOF ABSORBENT (Procedure Accessories) IMPLANT
PAD TRNSPT CINCHPAD 42X17IN TRNLU LEK (Procedure Accessories)
PROBE COAG FIAPC 6.9FR 7.2FT CRCMF PLG (Endoscopic Supplies)
PROBE COAGULATION L7.2 FT (Endoscopic Supplies)
PROBE COAGULATION L7.2 FT CIRCUMFERENTIAL PLUG PLAY FUNCTIONALITY (Endoscopic Supplies) IMPLANT
PROBE ELECTROSURGICAL L220 CM FLEXIBLE (Procedure Accessories)
PROBE ELECTROSURGICAL L220 CM FLEXIBLE STRAIGHT FIRE OD2.3 MM FIAPC (Procedure Accessories) IMPLANT
PROBE ESURG FIAPC 2.3MM 220CM STRL FLXB (Procedure Accessories)
SNARE 2.8 CM ROUND L240 CM OD10 MM (Endoscopic Supplies)
SNARE 2.8 CM ROUND L240 CM OD10 MM ODSEC2.4 MM CAPTIVATOR LOOP STIFF (Endoscopic Supplies) IMPLANT
SNARE 9 MM L230 CM OD2.4 MM COLD BRAID (Instrument)
SNARE 9 MM L230 CM OD2.4 MM EXACTO COLD BRAID WIRE CLEAN CUT (Instrument) IMPLANT
SNARE ESCP 2.8CM RND CPTVTR II 10MM 2.4 (Endoscopic Supplies)
SNARE ESCP 9MM EXACTO 2.4MM 230CM LF (Instrument)
SNARE ESCP MED OVL CPTFLX 2.4MM 240CM (Endoscopic Supplies)
SNARE ESCP MIC CPTVTR 13MM 240IN STRL (GE Lab Supplies)
SNARE MD OVAL 240CM 2.4 MM CPTFLX LP FLXBL ENDOSCOPIC POLYPECTOMY 27MM (Endoscopic Supplies) IMPLANT
SNARE MEDIUM OVAL L240 CM OD2.4 MM (Endoscopic Supplies)
SNARE SMALL HEXAGON CAPTIVATOR STIFF ENDOSCOPIC POLYPECTOMY (GE Lab Supplies) IMPLANT
SOL FORMALIN 10% PREFILL 30ML (Procedure Accessories) ×8
SPONGE GAUZE L4 IN X W4 IN 4 PLY HIGH (Sponge)
SPONGE GAUZE L4 IN X W4 IN 4 PLY NONWOVEN LINT FREE CURITY RAYON (Sponge) IMPLANT
SPONGE GZE RYN PLSTR CRTY 4X4IN LF NS 4 (Sponge)
SYRINGE 50 ML GRADUATE NONPYROGENIC DEHP (Syringes, Needles)
SYRINGE 50 ML GRADUATE NONPYROGENIC DEHP FREE PVC FREE BD MEDICAL (Syringes, Needles) IMPLANT
SYRINGE MED 50ML LF STRL GRAD N-PYRG (Syringes, Needles)
TRAP MCS PLS LF STRL SCR CAP TUBE ID LBL (Procedure Accessories)
TRAP MUCUS SCREW CAP TUBE ID LABEL (Procedure Accessories)
TRAP MUCUS SCREW CAP TUBE ID LABEL MEDLINE PLASTIC CLEAR (Procedure Accessories) IMPLANT
TUBING SCT PVC ARG 3/16IN 10FT LF STRL (Tubing) ×1
TUBING SUCTION ID3/16 IN L10 FT (Tubing) ×1
TUBING SUCTION ID3/16 IN L10 FT NONCONDUCTIVE STRAIGHT MALE FEMALE (Tubing) ×1 IMPLANT

## 2022-01-24 NOTE — Transfer of Care (Signed)
Anesthesia Transfer of Care Note    Patient: Barbara Rubio    Procedures performed: Procedure(s):  EGD w/ Bx's    Anesthesia type: General TIVA    Patient location:Phase II PACU    Last vitals:   Vitals:    01/24/22 1233   BP: 107/52   Pulse: 66   Resp: 13   Temp: 36.9 C (98.4 F)   SpO2: 98%       Post pain: Patient not complaining of pain, continue current therapy      Mental Status:sedated    Respiratory Function: tolerating room air    Cardiovascular: stable    Nausea/Vomiting: patient not complaining of nausea or vomiting    Hydration Status: adequate    Post assessment: no apparent anesthetic complications    Signed by: Delbert Phenix, CRNA  01/24/22 12:34 PM

## 2022-01-24 NOTE — Anesthesia Preprocedure Evaluation (Signed)
Anesthesia Evaluation    AIRWAY    Mallampati: II    TM distance: >3 FB  Neck ROM: full  Mouth Opening:full  Planned to use difficult airway equipment: No CARDIOVASCULAR    cardiovascular exam normal       DENTAL    no notable dental hx               PULMONARY    pulmonary exam normal     OTHER FINDINGS                                      Relevant Problems   CARDIO   (+) Essential hypertension      GI   (+) Gastroesophageal reflux disease   (+) Hiatal hernia               Anesthesia Plan    ASA 2     general                     intravenous induction   Detailed anesthesia plan: general endotracheal            informed consent obtained    Plan discussed with CRNA.                 ===============================================================  Inpatient Anesthesia Evaluation    Patient Name: Barbara Rubio,Barbara Rubio  Surgeon: Sabas SousKataria, Rahul D, MD  Patient Age / Sex: 71 y.o. / female    Medical History:     Past Medical History:   Diagnosis Date   . Abnormal vision     wears glasses   . Constipation     HX - HAS BEEN MORE PREVELANT POST BARIATRIC SURGERY.   Marland Kitchen. COVID-19 10/2021    HX - 21//23 URGENT CARE NOTE IN EPIC.   Marland Kitchen. Fibroids     PT HAD A PARTIAL HYSTERECTOMY - OVARIES ARE STILL IN PLACE.   Marland Kitchen. Gastroesophageal reflux disease 10/23/2013    HX.   Marland Kitchen. Hyperlipidemia     MANAGED WITH MEDS.   Marland Kitchen. Hypertension     PT STATES HE BP RUNS FROM 128-130/70s. MANAGED WITH MEDS - FOLLOWED BY PCP.   Marland Kitchen. Left wrist fracture 2018    HX   . LUQ pain     HX   . Psoriatic arthritis     HX - LOWER BACK, LEFT HAND, WRIST AND FINGERS   . Urinary incontinence 12/21/2017    HX OVERACTIVE BLADDER. ISSUE RESOLVED POST BARIATRIC SURGERY - PT STATED SHE LOST 65 LBS.       Past Surgical History:   Procedure Laterality Date   . BUNIONECTOMY Left    . COLONOSCOPY      X 2    . COLONOSCOPY N/A 04/13/2021    Procedure: COLONOSCOPY;  Surgeon: Franki MonteBashir, Showkat, MD;  Location: [id number];  Service: Gastroenterology;  Laterality: N/A;   . EGD N/A 05/18/2020     Procedure: EGD w/ bx's;  Surgeon: Sabas SousKataria, Rahul D, MD;  Location: [facility];  Service: Gastroenterology;  Laterality: N/A;   . EGD  12/2020   . HYSTERECTOMY      PARTIAL - OVARIES IN PLACE.   Marland Kitchen. LAPAROSCOPIC GASTRECTOMY SLEEVE WITH HIATAL HERNIA N/A 07/28/2020    Procedure: LAPAROSCOPIC GASTRECTOMY SLEEVE WITH HIATAL HERNIA, OMENTOPEXY, LAPAROSCOPIC RESECTION OF ANTRAL AND FUNDIC GASTRIC MASS;  Surgeon: Josefa HalfPourshojae, Hamid R, DO;  Location: [facility];  Service: General;  Laterality: N/A;         Allergies:   No Known Allergies      Medications:     Current Facility-Administered Medications   Medication Dose Route Frequency Last Rate Last Admin   . buffered lidocaine (XYLOCAINE) 1 % solution 0.3 mL  0.3 mL Intradermal Once       . lactated ringers infusion   Intravenous Continuous                  Prior to Admission medications    Medication Sig Start Date End Date Taking? Authorizing Provider   Ascorbic Acid (Vitamin C) 500 MG Cap Take 2 capsules by mouth daily   Yes [provider]   atorvastatin (LIPITOR) 10 MG tablet TAKE 1 TABLET BY MOUTH EVERY DAY AT NIGHT  Patient taking differently: Take 10 mg by mouth every evening TAKE 1 TABLET BY MOUTH EVERY DAY AT NIGHT 12/22/21  Yes Morrison Old, MD   B Complex Vitamins (B-Complex/B-12) Tab Take 1 tablet by mouth daily 5000 MCG   Yes [provider]   estradiol (ESTRACE) 0.1 MG/GM vaginal cream 1 g twice a week TWICE A WEEK - USES DOSE MONDAYS AND [facility] 01/21/20  Yes [provider]   lisinopril (ZESTRIL) 5 MG tablet Take 1 tablet (5 mg total) by mouth daily  Patient taking differently: Take 5 mg by mouth every evening 06/10/21  Yes Morrison Old, MD   Multiple Vitamin (multivitamin) capsule Take 1 capsule by mouth daily   Yes [provider]   vitamin E 200 UNIT capsule Take 200 Units by mouth daily   Yes [provider]   zinc gluconate 50 MG tablet Take 50 mg by mouth daily   Yes [provider]      Vitals   Temp:  [36.3 C (97.3 F)] 36.3 C (97.3 F)  Heart Rate:  [63] 63  Resp Rate:  [22] 22  BP: (146)/(76) 146/76    Wt Readings from Last 3 Encounters:   01/16/22 90.3 kg (199 lb)   10/12/21 90.7 kg (200 lb)   08/16/21 91.6 kg (202 lb)     BMI (Estimated body mass index is 29.39 kg/m as calculated from the following:    Height as of 01/16/22: 1.753 m (5\' 9" ).    Weight as of 01/16/22: 90.3 kg (199 lb).)  Temp Readings from Last 3 Encounters:   01/24/22 36.3 C (97.3 F)   10/12/21 36.3 C (97.3 F) (Temporal)   08/16/21 36.4 C (97.5 F) (Temporal)     BP Readings from Last 3 Encounters:   01/24/22 146/76   10/12/21 143/77   08/16/21 115/63     Pulse Readings from Last 3 Encounters:   01/24/22 63   10/12/21 67   08/16/21 79           Labs:   CBC:  Lab Results   Component Value Date    WBC 4.84 01/17/2022    HGB 11.3 (L) 01/17/2022    HCT 37.6 01/17/2022    PLT 182 01/17/2022       Chemistries:  Lab Results   Component Value Date    NA 145 01/17/2022    K 4.5 01/17/2022    CL 110 01/17/2022    CO2 26 01/17/2022    BUN 12.0 01/17/2022    CREAT 0.8 01/17/2022    GLU 86 01/17/2022    HGBA1C 5.5 06/09/2020    CA 9.2  01/17/2022    AST 14 05/27/2021       Coags:  Lab Results   Component Value Date    PT 11.5 06/09/2020    INR 1.0 06/09/2020     _____________________      Signed by: Wendie Simmer, MD  01/24/22   11:14 AM    =============================================================      Signed by: Wendie Simmer, MD 01/24/22 11:13 AM

## 2022-01-24 NOTE — Anesthesia Postprocedure Evaluation (Addendum)
Anesthesia Post Evaluation    Patient: Barbara Rubio    Procedure(s):  EGD w/ Bx's    Anesthesia type: general    Last Vitals:   Vitals Value Taken Time   BP 107/52 01/24/22 1233   Temp 36.9 C (98.4 F) 01/24/22 1233   Pulse 66 01/24/22 1233   Resp 13 01/24/22 1233   SpO2 98 % 01/24/22 1233                 Anesthesia Post Evaluation:     Patient Evaluated: bedside  Patient Participation: waiting for patient participation  Level of Consciousness: responsive to light touch  Pain Score: 0  Pain Management: adequate    Airway Patency: patent    Anesthetic complications: No      PONV Status: none    Cardiovascular status: acceptable  Respiratory status: acceptable  Hydration status: acceptable        Signed by: Delbert Phenix, CRNA, 01/24/2022 12:34 PM

## 2022-01-24 NOTE — Discharge Instr - AVS First Page (Addendum)
Reason for your Hospital Admission:    EGD w/ Bx's    Instructions for after your discharge:    Endoscopy Discharge Instructions -EGD AND COLONOSCOPY      General Instructions:    1. Following sedation, your judgment, perception, and coordination are considered impaired. Even though you may feel awake and alert, you are considered legally intoxicated. Therefore, until the next morning:    * Do not drive    * Do not operate appliances or equipment that requires reaction time (e.g. stove, electrical tools, machinery)    * Do not sign legal documents or be involved in important decisions    * Do not smoke if alone    * Do not drink alcoholic beverages    Go directly home and rest for several hours before resuming your routine activities    It is highly recommended to have a responsible adult stay with you for the next 24 hours    2. Tenderness, swelling, or pain may occur at the IV site where you received sedation. If you experience this, apply warm soaks to the area. Notify your physician if this persists.    3. If you have questions or problems contact your MD immediately. If you need immediate attention, call your MD, 911 and/or go to the nearest emergency room.    Report to the Physician any of the following:      For Upper Endoscopy (EGD), ERCP, EUS, or Dilations:    * Pain in chest    * Nausea/vomiting    * Fever/chills within 24 hours after procedure. Temp > 101 degrees Farenheit    * Severe and persistent abdominal pain or bloating    * Mild throat soreness may follow this procedure. Warm salt water gargling, lozenges, or cold drink and popscicles of your choice will most likely relieve your discomfort        Follow MD instructions on your procedural report

## 2022-01-24 NOTE — Discharge Instructions (Signed)
Post Anesthesia Discharge Instructions    Although you may be awake and alert in the recovery room, small amounts of anesthetic remain in your system for about 24 hours.  You may feel tired and sleepy during this time.      You are advised to go directly home from the hospital.    Plan to stay at home and rest for the remainder of the day.    It is advisable to have someone with you at home for 24 hours after surgery.    Do not operate a motor vehicle, or any mechanical or electrical equipment for the next 24 hours.      If you have not been able to urinate for 6-8 hours after your procedure - contact your surgeon. Be sure you are drinking adequate amounts of fluids.    Be careful when you are walking around, you may become dizzy.  The effects of anesthesia and/or medications are still present and drowsiness may occur    Do not consume alcohol, tranquilizers, sleeping medications, or any other non prescribed medication for the remainder of the day.    Diet:  begin with liquids, progress your diet as tolerated or as directed by your surgeon.  Nausea and vomiting may occur in the next 24 hours.

## 2022-01-24 NOTE — H&P (Signed)
History and Physical Exam for Gastroenterology Elective Procedure    Planned Procedure:  EGD  Procedure Date:  01/24/22  Chief Complaint/Diagnosis: Abdominal Pain  Barbara Rubio is a 71 y.o. female with past medical history below presenting for EGD.      Patient History  Past Medical History:   Diagnosis Date    Abnormal vision     wears glasses    Constipation     HX - HAS BEEN MORE PREVELANT POST BARIATRIC SURGERY.    COVID-19 10/2021    HX - 21//23 URGENT CARE NOTE IN EPIC.    Fibroids     PT HAD A PARTIAL HYSTERECTOMY - OVARIES ARE STILL IN PLACE.    Gastroesophageal reflux disease 10/23/2013    HX.    Hyperlipidemia     MANAGED WITH MEDS.    Hypertension     PT STATES HE BP RUNS FROM 128-130/70s. MANAGED WITH MEDS - FOLLOWED BY PCP.    Left wrist fracture 2018    HX    LUQ pain     HX    Psoriatic arthritis     HX - LOWER BACK, LEFT HAND, WRIST AND FINGERS    Urinary incontinence 12/21/2017    HX OVERACTIVE BLADDER. ISSUE RESOLVED POST BARIATRIC SURGERY - PT STATED SHE LOST 65 LBS.     Past Surgical History:   Procedure Laterality Date    BUNIONECTOMY Left     COLONOSCOPY      X 2     COLONOSCOPY N/A 04/13/2021    Procedure: COLONOSCOPY;  Surgeon: Franki Monte, MD;  Location: [facility];  Service: Gastroenterology;  Laterality: N/A;    EGD N/A 05/18/2020    Procedure: EGD w/ bx's;  Surgeon: Sabas Sous, MD;  Location: [facility];  Service: Gastroenterology;  Laterality: N/A;    EGD  12/2020    HYSTERECTOMY      PARTIAL - OVARIES IN PLACE.    LAPAROSCOPIC GASTRECTOMY SLEEVE WITH HIATAL HERNIA N/A 07/28/2020    Procedure: LAPAROSCOPIC GASTRECTOMY SLEEVE WITH HIATAL HERNIA, OMENTOPEXY, LAPAROSCOPIC RESECTION OF ANTRAL AND FUNDIC GASTRIC MASS;  Surgeon: Nicola Police, DO;  Location: [facility];  Service: General;  Laterality: N/A;     Social History     Tobacco Use    Smoking status: Never    Smokeless tobacco: Never    Tobacco comments:     Never smoked or used tobacco    Vaping Use    Vaping status: Never Used   Substance Use Topics    Alcohol use: Yes     Comment: 1-2 DRINKS MONTHLY    Drug use: Never     Family History   Problem Relation Age of Onset    Hypertension Mother     Hypertension Father     Hypertension Sister     Hypertension Brother     Migraines Neg Hx     Stroke Neg Hx     Seizures Neg Hx    No Known Allergies  Medications  Current Outpatient Medications   Medication Instructions    Ascorbic Acid (Vitamin C) 500 MG Cap 2 capsules, Oral, Daily    atorvastatin (LIPITOR) 10 MG tablet TAKE 1 TABLET BY MOUTH EVERY DAY AT NIGHT    B Complex Vitamins (B-Complex/B-12) Tab 1 tablet, Oral, Daily, 5000 MCG    estradiol (ESTRACE) 1 g, twice weekly, TWICE A WEEK - USES DOSE MONDAYS AND WEDNESDAYS    lisinopril (ZESTRIL) 5 mg, Oral,  Daily    Multiple Vitamin (multivitamin) capsule 1 capsule, Oral, Daily    vitamin E 200 Units, Oral, Daily    zinc gluconate 50 mg, Oral, Daily     Review of Systems  Pertinent items are noted in history of present illness, otherwise all systems were reviewed and are negative.           General Appearance:  Alert, well appearing, and in no distress.  Mood and affect appropriate.   Head:  Normocephalic, without obvious abnormality, atraumatic   Eyes:  Extraocular movements intact   Throat: Moist mucous membranes, pharynx normal without lesions   Neck: Supple, no significant adenopathy   Lungs:   Clear to auscultation bilaterally, respirations unlabored   Heart:  Regular rate and rhythm, S1 and S2 normal   Abdomen:   Soft, non-tender, non-distended, bowel sounds active in all four quadrants, no masses, no hepatosplenomegaly, no rebound or guarding   Rectal: Deferred   Extremities: No pedal edema noted   Skin: Normal coloration and turgor   Neurologic: Grossly normal     Anesthesia History:  No problems with anesthesia    Is patient okay for planned sedation?  Yes    Is patient experiencing pain?  No   Pain Scale:  0    Recommendations were discussed  with patient who concurred with planned procedure.     Annamaria Helling, MD  Gastroenterology & Hepatology

## 2022-01-25 ENCOUNTER — Encounter: Payer: Self-pay | Admitting: Internal Medicine

## 2022-01-26 ENCOUNTER — Emergency Department: Payer: Worker's Comp, Other unspecified

## 2022-01-26 ENCOUNTER — Emergency Department
Admission: EM | Admit: 2022-01-26 | Discharge: 2022-01-26 | Disposition: A | Discharge status: Home / Self Care | Payer: Worker's Comp, Other unspecified | Attending: Emergency Medicine | Admitting: Emergency Medicine

## 2022-01-26 DIAGNOSIS — Y990 Civilian activity done for income or pay: Secondary | ICD-10-CM | POA: Insufficient documentation

## 2022-01-26 DIAGNOSIS — W19XXXA Unspecified fall, initial encounter: Secondary | ICD-10-CM

## 2022-01-26 DIAGNOSIS — Y92219 Unspecified school as the place of occurrence of the external cause: Secondary | ICD-10-CM | POA: Insufficient documentation

## 2022-01-26 DIAGNOSIS — Y9301 Activity, walking, marching and hiking: Secondary | ICD-10-CM | POA: Insufficient documentation

## 2022-01-26 DIAGNOSIS — I10 Essential (primary) hypertension: Secondary | ICD-10-CM | POA: Insufficient documentation

## 2022-01-26 DIAGNOSIS — S0993XA Unspecified injury of face, initial encounter: Secondary | ICD-10-CM | POA: Insufficient documentation

## 2022-01-26 DIAGNOSIS — S0083XA Contusion of other part of head, initial encounter: Secondary | ICD-10-CM | POA: Insufficient documentation

## 2022-01-26 DIAGNOSIS — S0990XA Unspecified injury of head, initial encounter: Secondary | ICD-10-CM | POA: Insufficient documentation

## 2022-01-26 DIAGNOSIS — W109XXA Fall (on) (from) unspecified stairs and steps, initial encounter: Secondary | ICD-10-CM | POA: Insufficient documentation

## 2022-01-26 MED ORDER — PENICILLIN V POTASSIUM 500 MG PO TABS
500.0000 mg | ORAL_TABLET | Freq: Four times a day (QID) | ORAL | 0 refills | Status: AC
Start: 2022-01-26 — End: 2022-02-05

## 2022-01-26 NOTE — Discharge Instructions (Signed)
Keep your appointment with your dentist tomorrow. Take medications as prescribed. Return for any worsening or concerning symptoms.

## 2022-01-26 NOTE — ED Triage Notes (Signed)
Pt reports a she fell while walking up 3 sets of stairs. Pt states she hit her head/face area and chipped her tooth.  Pt denies LOC, head, neck or back pain. Pt was seen at [facility] today after fall and told to come to ER for CT scan. Pt has dentist appt tomorrow.

## 2022-01-26 NOTE — ED Provider Notes (Signed)
History     Chief Complaint   Patient presents with    Fall       Fall  Pertinent negatives include no fever, no abdominal pain, no nausea, no vomiting and no headaches.        Historian: patient  Interpreter: none  Chief Complaint: fall injuries   Location:  face  Duration: 8 hours  Severity: mild  Quality: painless  Timing: constant  Progression:  improving  Radiation:  none  Alleviating Factors: ice   Ineffective Treatments: n/a   Exacerbating Factors:  n/a  Associated Signs and Symptoms:   Erythema, broken tooth    Patient is a 71 year old female with past medical history of GERD hyperlipidemia hypertension presents for evaluation of fall injuries.  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  Around noon today she was walking up 3 stairs outside when her flip-flop got caught on the top stair and she fell.  States she put her right hand over her face to break her fall.  States she hit the right side of her face on the concrete and chipped her right frontal tooth.  She denies any dizziness, chest pain, palpitations prior to the fall or after the fall.  Denies headache, facial pain, vision changes, focal weakness or numbness, nausea, vomiting or neck pain.  She did not lose consciousness  She is not on blood thinners  Denies any neck or back pain   Denies any gait instability or difficulty ambulating  States she has an appointment tomorrow morning for evaluation of her cracked tooth  States she went to urgent care after the fall and was told that she needed a CT scan due to her age and injury  Past Medical History:   Diagnosis Date    Abnormal vision     wears glasses    Constipation     HX - HAS BEEN MORE PREVELANT POST BARIATRIC SURGERY.    COVID-19 10/2021    HX - 21//23 URGENT CARE NOTE IN EPIC.    Fibroids     PT HAD A PARTIAL HYSTERECTOMY - OVARIES ARE STILL IN PLACE.    Gastroesophageal reflux disease 10/23/2013    HX.    Hyperlipidemia     MANAGED WITH MEDS.    Hypertension     PT  STATES HE BP RUNS FROM 128-130/70s. MANAGED WITH MEDS - FOLLOWED BY PCP.    Left wrist fracture 2018    HX    LUQ pain     HX    Psoriatic arthritis     HX - LOWER BACK, LEFT HAND, WRIST AND FINGERS    Urinary incontinence 12/21/2017    HX OVERACTIVE BLADDER. ISSUE RESOLVED POST BARIATRIC SURGERY - PT STATED SHE LOST 65 LBS.       Past Surgical History:   Procedure Laterality Date    BUNIONECTOMY Left     COLONOSCOPY      X 2     COLONOSCOPY N/A 04/13/2021    Procedure: COLONOSCOPY;  Surgeon: Franki Monte, MD;  Location: [facility];  Service: Gastroenterology;  Laterality: N/A;    EGD N/A 05/18/2020    Procedure: EGD w/ bx's;  Surgeon: Sabas Sous, MD;  Location: [facility];  Service: Gastroenterology;  Laterality: N/A;    EGD  12/2020    EGD N/A 01/24/2022    Procedure: EGD w/ Bx's;  Surgeon: Sabas Sous, MD;  Location: [facility];  Service: Gastroenterology;  Laterality:  N/A;    HYSTERECTOMY      PARTIAL - OVARIES IN PLACE.    LAPAROSCOPIC GASTRECTOMY SLEEVE WITH HIATAL HERNIA N/A 07/28/2020    Procedure: LAPAROSCOPIC GASTRECTOMY SLEEVE WITH HIATAL HERNIA, OMENTOPEXY, LAPAROSCOPIC RESECTION OF ANTRAL AND FUNDIC GASTRIC MASS;  Surgeon: Nicola Police, DO;  Location: [facility];  Service: General;  Laterality: N/A;       Family History   Problem Relation Age of Onset    Hypertension Mother     Hypertension Father     Hypertension Sister     Hypertension Brother     Migraines Neg Hx     Stroke Neg Hx     Seizures Neg Hx        Social  Social History     Tobacco Use    Smoking status: Never    Smokeless tobacco: Never    Tobacco comments:     Never smoked or used tobacco   Vaping Use    Vaping status: Never Used   Substance Use Topics    Alcohol use: Yes     Comment: 1-2 DRINKS MONTHLY    Drug use: Never       .     No Known Allergies    Home Medications       Med List Status: In Progress Set By: Kelso Lodge, RN at 01/26/2022  7:57 PM              Ascorbic Acid  (Vitamin C) 500 MG Cap     Take 2 capsules by mouth daily     atorvastatin (LIPITOR) 10 MG tablet     TAKE 1 TABLET BY MOUTH EVERY DAY AT NIGHT     Patient taking differently: Take 10 mg by mouth every evening TAKE 1 TABLET BY MOUTH EVERY DAY AT NIGHT     B Complex Vitamins (B-Complex/B-12) Tab     Take 1 tablet by mouth daily 5000 MCG     estradiol (ESTRACE) 0.1 MG/GM vaginal cream     1 g twice a week TWICE A WEEK - USES DOSE MONDAYS AND WEDNESDAYS     lisinopril (ZESTRIL) 5 MG tablet     Take 1 tablet (5 mg total) by mouth daily     Patient taking differently: Take 5 mg by mouth every evening     Multiple Vitamin (multivitamin) capsule     Take 1 capsule by mouth daily     vitamin E 200 UNIT capsule     Take 200 Units by mouth daily     zinc gluconate 50 MG tablet     Take 50 mg by mouth daily             Review of Systems   Constitutional:  Negative for chills and fever.   HENT:  Positive for dental problem.    Eyes: Negative.  Negative for photophobia, pain and visual disturbance.   Respiratory:  Negative for cough and shortness of breath.    Cardiovascular:  Negative for chest pain and leg swelling.   Gastrointestinal:  Negative for abdominal pain, nausea and vomiting.   Genitourinary:  Negative for dysuria.   Musculoskeletal:  Negative for back pain.   Skin:  Positive for wound. Negative for rash.   Neurological:  Negative for dizziness and headaches.   Hematological:  Does not bruise/bleed easily.       Physical Exam    BP: 172/89, Heart Rate: 67, Temp: 98.4 F (36.9  C), Resp Rate: 16, SpO2: 99 %    Physical Exam  Vitals and nursing note reviewed.   Constitutional:       General: She is not in acute distress.  HENT:      Head: Normocephalic.        Right Ear: External ear normal.      Left Ear: External ear normal.      Nose: Nose normal.      Mouth/Throat:      Mouth: Mucous membranes are moist.      Dentition: Abnormal dentition.     Eyes:      Extraocular Movements: Extraocular movements intact.    Cardiovascular:      Rate and Rhythm: Normal rate and regular rhythm.      Pulses: Normal pulses.      Heart sounds: Normal heart sounds.   Pulmonary:      Effort: Pulmonary effort is normal. No respiratory distress.      Breath sounds: Normal breath sounds.   Chest:      Chest wall: No tenderness.   Abdominal:      General: Abdomen is flat.      Palpations: Abdomen is soft.      Tenderness: There is no abdominal tenderness.   Musculoskeletal:         General: No swelling or deformity.      Cervical back: Normal range of motion and neck supple. No rigidity or tenderness.   Skin:     General: Skin is warm and dry.      Capillary Refill: Capillary refill takes less than 2 seconds.   Neurological:      General: No focal deficit present.      Mental Status: She is alert and oriented to person, place, and time.   Psychiatric:         Mood and Affect: Mood normal.           MDM and ED Course     ED Medication Orders (From admission, onward)      None               Medical Decision Making  Number and Complexity of Problems Addressed (select at least one)    Complexity: High: 1 acute or chronic illness or injury that poses a threat to life or bodily function      Presenting acute/chronic problems:  acute head injury    Differential diagnosis to include but not limited to:  contusion, facial fracture, orbital fracture    Chronic illness impacting care (obesity, diabetes, hypertension, elderly - state impact):  htn, advanced age      Plan:   Patient cardiovascularly and neurologically intact without any focal deficits. Has some mild erythema to the right cheek with minimal tenderness on exam. EOM intact bilaterally. R partially avulsed front tooth---will rx abx and have her fu with dentist tomorrow. Will ct head and face. No neck pain--full ROM    Considered ct c spine but deferred since patient has no pain on exam, has FROM and does not report pain.       Risk of Complications and/or Morbidity or Mortality of Patient  Management  (select one if applicable)    Risk: High       I, Jalyric Kaestner Para March, NP-C, have been the primary provider for Dorethea Clan during this Emergency Dept visit. The attending signature signifies review and agreement of the history, physical exam, evaluation, clinical impression and plan except as noted.  I have reviewed the vital signs, nursing notes, and past medical, surgical, family and social histories.     Oxygen saturation by pulse oximetry is  95-100% room air. Normal- no interventions needed.    " *This note was generated by the Epic EMR system/ Dragon speech recognition and   may contain inherent errors or omissions not intended by the user. Grammatical   errors, random word insertions, deletions, pronoun errors and incomplete   sentences are occasional consequences of this technology due to software   limitations. Not all errors are caught or corrected. If there are questions or   concerns about the content of this note or information contained within the body   of this dictation they should be addressed directly with the author for   Clarification."      Amount and/or Complexity of Data Reviewed  Radiology: ordered and independent interpretation performed. Decision-making details documented in ED Course.     Details: no acute findings on CT scan    Risk  OTC drugs.  Prescription drug management.          ED Course as of 01/26/22 2151   Thu Jan 26, 2022   2140 No acute findings on CT imaging.  She is cardiovascularly and neurologically intact.  No vision changes, nausea or vomiting.  Will discharge patient with strict return precautions.  We will cover her with penicillin for the broken tooth.  She will keep her appointment tomorrow with her dentist.  She will follow-up with her primary care provider.  Give her strict return precautions. [MD]   2150 Patient reassessed. Resting comfortably, appears nontoxic, afebrile, in NAD.     Reports improvement of symptoms.     Discussed diagnosis,  treatment plan, follow up, and avoiding aggravating factors.     Patient will follow up with PCP or specialist in the next 1-2 days.     Patient will return to the ER for any worsening or concerning symptoms. Patient demonstrates understanding.    Questions answered.     [MD]      ED Course User Index  [MD] Inis Sizer, NP     Radiology Results (24 Hour)       Procedure Component Value Units Date/Time    CT Maxillofacial Bones [[id number]] Collected: 01/26/22 2117    Order Status: Completed Updated: 01/26/22 2122    Narrative:      HISTORY: Posttraumatic facial pain    COMPARISON: None available.    TECHNIQUE: CT of the paranasal sinuses performed without intravenous  contrast. Multiplanar reformatted images were created and reviewed. The  following dose reduction techniques were utilized: automated exposure  control and/or adjustment of the mA and/or KV according to patient size,  and the use of an iterative reconstruction technique.    FINDINGS:   There is soft tissue swelling in the right malar region. No acute  maxillofacial fracture is identified. The orbits are intact. There are  no air-fluid levels in the paranasal sinuses.    There are degenerative changes involving the temporal ventricular  joints. There is also advanced degenerative cervical spondylosis,  partially imaged.      Impression:        1.No acute maxillofacial fracture is identified.    Nicoletta Dress, MD  01/26/2022 9:20 PM    CT Head without Contrast [[id number]] Collected: 01/26/22 2114    Order Status: Completed Updated: 01/26/22 2119    Narrative:      HISTORY: Posttraumatic headache    COMPARISON:  None available.    TECHNIQUE: CT of the head performed without intravenous contrast. The  following dose reduction techniques were utilized: automated exposure  control and/or adjustment of the mA and/or KV according to patient size,  and the use of an iterative reconstruction technique.    FINDINGS:  There is no mass effect or midline shift. There  is no acute intracranial  hemorrhage or territorial infarct. The calvarium is intact.      Impression:         1.No acute intracranial abnormality is identified.    Nicoletta Dress, MD  01/26/2022 9:17 PM              Patient was discharged by me at 9:51 PM     Departure Condition: Good, stable  Patient Reevaluation (after medication/intervention): Yes, no change  Mobility at Discharge: Ambulatory  Patient Teaching: Discharge instructions reviewed and patient verbalizes understanding  Departure Mode: By self        Procedures    Clinical Impression & Disposition     Clinical Impression  Final diagnoses:   Fall, initial encounter   Injury of head, initial encounter   Contusion of face, initial encounter   Dental injury, initial encounter        ED Disposition       ED Disposition   Discharge    Condition   --    Date/Time   Thu Jan 26, 2022  9:37 PM    Comment   Abreanna Drawdy discharge to home/self care.    Condition at disposition: Stable                  New Prescriptions    PENICILLIN V POTASSIUM (VEETID) 500 MG TABLET    Take 1 tablet (500 mg) by mouth 4 (four) times daily for 10 days                   Inis Sizer, NP  01/26/22 2151       Inis Sizer, NP  01/26/22 2152       Alinda Deem, MD  01/27/22 [phone]

## 2022-01-27 LAB — LAB USE ONLY - HISTORICAL SURGICAL PATHOLOGY

## 2022-01-28 ENCOUNTER — Encounter (INDEPENDENT_AMBULATORY_CARE_PROVIDER_SITE_OTHER): Payer: Self-pay

## 2022-02-16 ENCOUNTER — Encounter (INDEPENDENT_AMBULATORY_CARE_PROVIDER_SITE_OTHER): Payer: Self-pay | Admitting: Internal Medicine

## 2022-02-16 ENCOUNTER — Ambulatory Visit (INDEPENDENT_AMBULATORY_CARE_PROVIDER_SITE_OTHER): Payer: Medicare Other | Admitting: Internal Medicine

## 2022-02-16 VITALS — BP 138/75 | HR 67 | Temp 97.4°F | Wt 203.0 lb

## 2022-02-16 DIAGNOSIS — E782 Mixed hyperlipidemia: Secondary | ICD-10-CM

## 2022-02-16 DIAGNOSIS — Z1231 Encounter for screening mammogram for malignant neoplasm of breast: Secondary | ICD-10-CM

## 2022-02-16 DIAGNOSIS — Z9884 Bariatric surgery status: Secondary | ICD-10-CM

## 2022-02-16 DIAGNOSIS — I10 Essential (primary) hypertension: Secondary | ICD-10-CM

## 2022-02-16 DIAGNOSIS — E559 Vitamin D deficiency, unspecified: Secondary | ICD-10-CM

## 2022-02-16 DIAGNOSIS — Z0000 Encounter for general adult medical examination without abnormal findings: Secondary | ICD-10-CM

## 2022-02-16 DIAGNOSIS — E669 Obesity, unspecified: Secondary | ICD-10-CM

## 2022-02-16 MED ORDER — ESTRADIOL 0.1 MG/GM VA CREA
1.0000 g | TOPICAL_CREAM | VAGINAL | 0 refills | Status: DC
Start: 2022-02-16 — End: 2022-07-02

## 2022-02-16 NOTE — Progress Notes (Signed)
[facility] [address] [facility]            Barbara Rubio is a 71 y.o. female who presents today for the following Medicare Wellness Visit:  []  Initial Preventive Physical Exam (IPPE) - "Welcome to Medicare" preventive visit (Vision Screening required)   []  Annual Wellness Visit - Initial  [x]  Annual Wellness Visit - Subsequent     Health Risk Assessment:   During the past month, how would you rate your general health?:  (P) Good  Which of the following tasks can you do without assistance - drive or take the bus alone; shop for groceries or clothes; prepare your own meals; do your own housework/laundry; handle your own finances/pay bills; eat, bathe or get around your home?: (P) Drive or take the bus alone, Shop for groceries or clothes, Prepare your own meals, Do your own housework/laundry, Handle your own finances/pay bills, Eat, bathe, dress or get around your home  Which of the following problems have you been bothered by in the past month - dizzy when standing up; problems using the phone; feeling tired or fatigued; moderate or severe body pain?: (P) Moderate or severe body pain  Do you exercise for about 20 minutes 3 or more days per week?:(P) Yes  During the past month was someone available to help if you needed and wanted help?  For example, if you felt nervous, lonely, got sick and had to stay in bed, needed someone to talk to, needed help with daily chores or needed help just taking care of yourself.: (P) Yes  Do you always wear a seat belt?: (P) Yes  Do you have any trouble taking medications the way you have been told to take them?: (P) Yes  Have you been given any information that can help you with keeping track of your medications?: (P) No  Do you have trouble paying for your medications?: (P) No  Have you been given any information that can help you with hazards in your house, such as scatter rugs, furniture, etc?: (P) No  Do you feel unsteady when standing or walking?: (P)  No  Do you worry about falling?: (P) Yes  Have you fallen two or more times in the past year?: (P) No  Did you suffer any injuries from your falls in the past year?: (P) Yes     Care Team:   Patient Care Team:  Morrison Old, MD as PCP - General (Internal Medicine)  Nicola Police, DO as Consulting Physician (Surgery)      Hospitalizations:   Hospitalization within past year: [x]  No  []  Yes     Diagnosis:      Screenings:       12/31/2020     8:00 AM 02/11/2022    10:10 AM 02/11/2022    10:13 AM   Ambulatory Screenings   Falls Risk: Fallen more than 2 times in past year N  N   Falls Risk: Suffer any injuries? N  Y   Depression: PHQ2 Total Score 0 0    Depression: PHQ9 Total Score 0 0         Substance Use Disorder Screen:  In the past year, how often have you used the following?  1) Alcohol (For men, 5 or more drinks a day. For women, 4 or more drinks a day)  [x]  Never []  Once or Twice []  Monthly []  Weekly []  Daily or Almost Daily  2) Tobacco Products  [x]  Never []  Once  or Twice []  Monthly []  Weekly []  Daily or Almost Daily  3) Prescription Drugs for Non-Medical Reasons  [x]  Never []  Once or Twice []  Monthly []  Weekly []  Daily or Almost Daily  4) Illegal Drugs  [x]  Never []  Once or Twice []  Monthly []  Weekly []  Daily or Almost Daily             Functional Ability/Level of Safety:   Falls Risk/Home Safety Assessment:  ( see HRA and Screenings sections for additional assessment)  Home Safety: []  Stair handrails  []  Skid-resistant rugs/remove throw rugs   [x]  Grab bars  [x]  Clear pathways between rooms  [x]  Proper lighting stairs/ bathrooms/bedrooms  Get Up and Go (optional):  [x]   <20 secs  []   >20 secs    []   High risk for falls - Home Safety/Falls Risk Precautions reviewed with pt/family    Hearing Assessment:  Concerns for hearing loss: []  Yes  [x]   No  Hearing aids:   []   Right  []   Left  []   Bilateral   [x]   None  Whisper Test (optional):  []  Normal  []   Slightly decreased  []   Significantly  decreased    Exercise:  Frequency:  []   No formal exercise  []   1-2x/wk  [x]   3-4x/wk  []   >4x/wk  Duration:  []   15-30 mins/day  []   30-45 mins/day  []   45+ mins/day  Intensity:  []   Light  [x]   Moderate  []   Heavy        Activities of Daily Living:   ADL's Independent Minimal  Assistance Moderate  Assistance Total   Assistance   Bathing [x]  []  []  []    Dressing [x]  []  []  []    Mobility   [x]  []  []  []    Transfer [x]  []  []  []    Eating [x]  []  []  []    Toileting [x]  []  []  []      IADL's Independent Minimal  Assistance Moderate  Assistance Total   Assistance   Phone [x]  []  []  []    Housekeeping [x]  []  []  []    Laundry [x]  []  []  []    Transportation [x]  []  []  []    Medications [x]  []  []  []    Finances [x]  []  []  []       ADL assistance: [x]  No assistance needed  []  Spouse  []  Sibling  []  Son   []  Daughter []  Children  []  Home Health Aide []  Other:       Advance Care Planning:   Discussion of Advance Directives:   [x]  Advance Directive in chart  []  Advance Directive not in chart - requested to provide []  No Advance Directive.  Form Provided  []  No Advance Directive.  Pt declines. []  Not addressed today  []  Other:     Exam:   BP 138/75 (BP Site: Left arm, Patient Position: Sitting, Cuff Size: Medium)   Pulse 67   Temp 97.4 F (36.3 C) (Temporal)   Wt 92.1 kg (203 lb)   SpO2 97%   BMI 30.87 kg/m      Physical Exam   GENERAL APPEARANCE: alert, in no acute distress, well developed, well nourished      oriented to time, place, and person.   EYES: PERRL, EOMI  NECK/THYROID: neck supple, no carotid bruit, carotid pulse 2+ bilaterally, no neck mass palpated, no jugular venous distention, no thyromegaly.  LYMPH: no cervical/supraclavicular adenopathy   HEART: S1, S2 normal, no murmurs, rubs, gallops, regular rate and rhythm.   LUNGS: normal  effort / no distress, normal breath sounds, clear to auscultation    bilaterally, no wheezes, rales, rhonchi.   EXTREMITIES: No LE edema bilaterally.   PERIPHERAL PULSES:  peripheral pulses  normal, no pedal edema, no clubbing or cyanosis  PSYCH: alert and oriented to time,place and person.   SKIN: moist, no focal rash  NEURO: no focal deficit              Evaluation of Cognitive Function:   Mood/affect: [x]  Appropriate  []   Other:   Appearance: [x]  Neatly groomed  [x]  Adequately nourished  []  Other:  Family member/caregiver input: []  Present - no concerns  [x]   Not present in room  []  Present - concerns:    Cognitive Assessment:  Mini-Cog Result (three word registration- banana, sunrise, chair / clock drawing):   [x]   > 3 points - negative screen for dementia   []  3 recalled words - negative screen for dementia   []  1-2 recalled words and normal clock draw - negative for cognitive impairment   []  1-2 recalled words and abnormal clock draw - positive for cognitive impairment   []  0 recalled words - positive for cognitive impairment         Assessment/Plan:   1. Routine general medical examination at a health care facility  Recommend annual flu vaccine, Tdap every 10 years,  sunscreen, seatbelts, no texting while driving.  Health promotion and disease prevention discussed. Exercise and diet discussed. Optimize aerobic exercise per ACC guidelines 150 min per week. Aim for 2-3 L of water per day. Dietary fiber 25 grams per day. Optimize weight. Daily calcium and vitamin D.    2. Essential hypertension  Pt stable on current medication regimen, no concerns. Continue current medication    3. Mixed hyperlipidemia  - Lipid panel; Future  - Comprehensive metabolic panel; Future  Pt stable on current medication regimen, no concerns. Continue current medication  4. Obesity (BMI 30-39.9)  HIgh BMI Follow Up  BMI Follow Up Care Plan Documented  Encouragement to Exercise    5. Vitamin D deficiency  resolved  6. S/P laparoscopic sleeve gastrectomy  Continue to f/u yearly with bariatric  7. Encounter for screening mammogram for malignant neoplasm of breast  - Mammo Digital 2D Screening Bilateral W CAD; Future    During  the course of the visit, the patient was educated and counseled about appropriate screening and preventive services including:   Pneumococcal vaccine  Influenza vaccine  Td vaccine  Shingrix vaccine  COVID vaccine  Screening mammography  Bone densitometry screening  Colorectal cancer screening  Diabetes screening  Glaucoma screening  Nutrition counseling  Physical activity/exercise counseling  Heart healthy diet counseling  Falls risk prevention and home safety         Morrison Old, MD    02/16/2022     The following sections were reviewed this encounter by the provider:   Tobacco  Allergies  Meds  Problems  Med Hx  Surg Hx  Fam Hx          History:   Patient Active Problem List   Diagnosis    Essential hypertension    Mixed hyperlipidemia    Gastroesophageal reflux disease    Hiatal hernia    Vitamin D deficiency    S/P laparoscopic sleeve gastrectomy    Obesity (BMI 30-39.9)    LUQ abdominal pain    Chronic constipation      Past Medical History:   Diagnosis Date  Abnormal vision     wears glasses    Constipation     HX - HAS BEEN MORE PREVELANT POST BARIATRIC SURGERY.    COVID-19 10/2021    HX - 21//23 URGENT CARE NOTE IN EPIC.    Fibroids     PT HAD A PARTIAL HYSTERECTOMY - OVARIES ARE STILL IN PLACE.    Gastroesophageal reflux disease 10/23/2013    HX.    Hyperlipidemia     MANAGED WITH MEDS.    Hypertension     PT STATES HE BP RUNS FROM 128-130/70s. MANAGED WITH MEDS - FOLLOWED BY PCP.    Left wrist fracture 2018    HX    LUQ pain     HX    Psoriatic arthritis     HX - LOWER BACK, LEFT HAND, WRIST AND FINGERS    Urinary incontinence 12/21/2017    HX OVERACTIVE BLADDER. ISSUE RESOLVED POST BARIATRIC SURGERY - PT STATED SHE LOST 65 LBS.     Past Surgical History:   Procedure Laterality Date    BUNIONECTOMY Left     COLONOSCOPY      X 2     COLONOSCOPY N/A 04/13/2021    Procedure: COLONOSCOPY;  Surgeon: Franki Monte, MD;  Location: [facility];  Service: Gastroenterology;  Laterality: N/A;    EGD N/A  05/18/2020    Procedure: EGD w/ bx's;  Surgeon: Sabas Sous, MD;  Location: [facility];  Service: Gastroenterology;  Laterality: N/A;    EGD  12/2020    EGD N/A 01/24/2022    Procedure: EGD w/ Bx's;  Surgeon: Sabas Sous, MD;  Location: [facility];  Service: Gastroenterology;  Laterality: N/A;    HYSTERECTOMY      PARTIAL - OVARIES IN PLACE.    LAPAROSCOPIC GASTRECTOMY SLEEVE WITH HIATAL HERNIA N/A 07/28/2020    Procedure: LAPAROSCOPIC GASTRECTOMY SLEEVE WITH HIATAL HERNIA, OMENTOPEXY, LAPAROSCOPIC RESECTION OF ANTRAL AND FUNDIC GASTRIC MASS;  Surgeon: Nicola Police, DO;  Location: [facility];  Service: General;  Laterality: N/A;     No Known Allergies   Outpatient Medications Marked as Taking for the 02/16/22 encounter (Office Visit) with Morrison Old, MD   Medication Sig Dispense Refill    Ascorbic Acid (Vitamin C) 500 MG Cap Take 2 capsules by mouth daily      atorvastatin (LIPITOR) 10 MG tablet TAKE 1 TABLET BY MOUTH EVERY DAY AT NIGHT (Patient taking differently: Take 10 mg by mouth every evening TAKE 1 TABLET BY MOUTH EVERY DAY AT NIGHT) 90 tablet 1    B Complex Vitamins (B-Complex/B-12) Tab Take 1 tablet by mouth daily 5000 MCG      lisinopril (ZESTRIL) 5 MG tablet Take 1 tablet (5 mg total) by mouth daily (Patient taking differently: Take 5 mg by mouth every evening) 90 tablet 3    Multiple Vitamin (multivitamin) capsule Take 1 capsule by mouth daily      vitamin E 200 UNIT capsule Take 200 Units by mouth daily      zinc gluconate 50 MG tablet Take 50 mg by mouth daily      [DISCONTINUED] estradiol (ESTRACE) 0.1 MG/GM vaginal cream 1 g twice a week TWICE A WEEK - USES DOSE MONDAYS AND WEDNESDAYS       Social History     Tobacco Use    Smoking status: Never    Smokeless tobacco: Never    Tobacco comments:     Never smoked or used tobacco  Vaping Use    Vaping status: Never Used   Substance Use Topics    Alcohol use: Yes     Comment: 1-2 DRINKS MONTHLY    Drug use:  Never      Family History   Problem Relation Age of Onset    Hypertension Mother     Hypertension Father     Hypertension Sister     Hypertension Brother     Migraines Neg Hx     Stroke Neg Hx     Seizures Neg Hx            ===================================================================    Additional Documentation:

## 2022-02-16 NOTE — Patient Instructions (Signed)
MEDICARE WELLNESS PERSONAL PREVENTION PLAN   As part of the Medicare Wellness portion of your visit today, we are providing you with this personalized preventative plan of care. We have listed below some of the preventative services that are recommended for patients based upon their age and gender. These recommendations are taken directly from the [place] [employer] (USPSTF) and the [employer] (ACIP).     Health Maintenance   Topic Date Due    Shingrix Vaccine 50+ (1) Never done    INFLUENZA VACCINE  05/23/2022    Statin Use  12/23/2022    MAMMOGRAM  01/26/2023    FALLS RISK ANNUAL  02/12/2023    DEPRESSION SCREENING  02/12/2023    Tetanus Ten-Year  07/30/2028    Colorectal Cancer Screening  04/14/2031    HEPATITIS C SCREENING  Completed    DXA Scan  Completed    Pneumonia Vaccine Age 33+  Completed    COVID-19 Vaccine  Addressed       Health Maintenance Topics with due status: Overdue       Topic Date Due    Shingrix Vaccine 50+ Never done        Immunization History   Administered Date(s) Administered    COVID-19 mRNA MONOVALENT vaccine PRIMARY SERIES 12 years and above [employer]) 30 mcg/0.3 mL (DILUTE BEFORE USE) 11/21/2019, 11/21/2019, 12/15/2019, 12/15/2019, 08/14/2020    COVID-19 mRNA MONOVALENT vaccine PRIMARY SERIES 12 years and above [employer]) 30 mcg/0.3 mL (DO NOT DILUTE) 03/11/2021    INFLUENZA HIGH DOSE 65 YRS+ 10/26/2017, 07/30/2018, 06/22/2020    INFLUENZA HIGH DOSE 65 YRS+ Quad 0.7 mL 07/16/2021    INFLUENZA QUADRIVALENT ADJUVANTED PF 65 YRS & OLDER (FLUAD) 07/25/2019    Influenza (Im) Preservative Free 06/22/2015    Pneumococcal 23 valent 07/30/2018    Pneumococcal Conjugate 13-Valent 10/29/2017, 05/31/2018    Pneumococcal Conjugate 20-Valent 03/23/2021    Tdap 07/30/2018        Your major risk factors:   Recommendations for improvement:      The list below includes many common screening recommendations but is not meant to be comprehensive. You  may be eligible for other preventative services depending upon your personal risk factors.   Colorectal Cancer Screening - All adults age 58-75 should undergo periodic colorectal cancer screening. The decision to screen for colorectal cancer in adults aged 26 to 61 years should be an individual one,taking into account your overall health and prior screening history.   Breast Cancer Screening - Women age 86-74 should have mammograms every other year (please note that this recommendation may not be appropriate for every woman - your physician can answer specific questions you may have). The USPSTF concludes that the current evidence is insufficient to assess the balance of benefits and harms of screening mammography in women aged 37 years or older.    Cervical Cancer Screening - Women over 43 do not require pap smears as long as prior screening has been normal and are not otherwise at high risk for cervical cancer. For women aged 36 to 32 years, the USPSTF recommends screening every 3 years with cervical cytology alone, every 5 years with high-risk human papillomavirus (hrHPV) testing alone, or every 5 years with hrHPV testing in combination with cytology (cotesting).   Osteoporosis Screening -  The USPSTF recommends screening for osteoporosis with bone measurement testing to prevent osteoporotic fractures in women 65 years and older.  Hepatitis C Screening - Recommend screening for hepatitis  C virus (HCV) infection in all adults aged 2 to 70 years.  Lung cancer Screening - Recommend annual screening for lung cancer with low-dose computed tomography (LDCT) in adults ages 37 to 100 years who have a 20 pack-year smoking history and currently smoke or have quit within the past 15 years.  Recommended Vaccinations   Influenza one dose annually   Tetanus/diphtheria one booster every 10 years   Zoster/Shingles - Shingrix two doses after age 27 (second dose given 2-6 months after first dose)  Pneumovax (PPSV23) one dose for  adults aged ?65 years  Prevnar(PCV13) shared clinical decision-making is recommended regarding administration of this vaccine to persons aged ?65 years who do not have an immunocompromising condition, cerebrospinal fluid leak, or cochlear implant.     PERSONAL PREVENTION PLAN   Your Personal Prevention Plan is based on your overall health and your responses to the health questionnaire you completed. The following information is for you to review in addition to the recommendations, referrals, and tests we have discussed at your visit.     Physical Activity:   Physical activity can help you maintain a healthy weight, prevent or control illness, reduce stress, and sleep better. It can also help you improve your balance to avoid falls. Try to build up to and maintain a total of 30 minutes of activity each day. If you are able, try walking, doing yard or housework, and taking the stairs more often. You can also strengthen your muscles with exercises done while sitting or lying down.   Emotional Health:   Feeling "down in the dumps" or anxious every now and then is a natural part of life. If this feeling lasts for a few weeks or more, talk with me as soon as possible. It could be a sign of a problem that needs treatment. There are many types of treatment available.   Falls:   You can reduce your risk of falling by making changes in your home. Remove items that may cause tripping, improve lighting, and consider installing grab bars.   Talk with me if you have problems with balance and walking. To prevent falls, you may need your vision, hearing, or blood pressure checked. Exercises to improve your strength and balance, or using a cane or walker, may help. Review your medicines with me at every visit, because some can affect balance. Please be sure to let me know if you fall or are fearful you may fall.   Urinary Leakage:   Urine leakage is common, but it is not a normal part of aging. Talk with me about any urine leakage  so that the cause can be found and treated. Treatment can include bladder training, exercises, medicine or surgery.   Pain:   We all have aches and pains at times, but chronic pain can change how you feel and live every day. Please talk with me about any symptoms of chronic pain so that we can determine how best to treat.   Sleep:   Getting a good night's sleep is vital to your health and well-being and can help prevent or manage health problems. Often, sleep can be improved by changing behaviors, including when you go to bed and what you do before bed. Sleep apnea can cause problems such as struggling to stay awake during the day. Please let me know if you would like to learn more about improving your sleep and/or think you may have sleep apnea.   Seat Belt:   Please remember to  wear a seat belt when driving or riding in a vehicle. It is one of the most important things you can do to stay safe in a car.   Nutrition:   Remember to eat plenty of fruits, vegetables, whole grains, and dairy. Drink at least 64 ounces (8 full glasses) of water a day, unless you have been advised to limit fluids.   Alcohol:   Alcohol can have a greater effect on older people, who may feel its effects at a lower amount. Older people should limit alcoholic drinks (no more than one a day for women and no more than two a day for men). Please let me know if alcohol use becomes a problem.   Tobacco:   Not smoking or using other forms of tobacco is one of the most important things you can do for your health. Here is some more information about the importance of quitting smoking and how to quit smoking - [employer]  Advance Directives:   There may come a time when medical decisions need to be made on your behalf. Please talk with your family, and with me, about your wishes. It is important to provide information about your decisions, and any formal advance directives, for your medical record. Here is  additional information on advanced directives - [employer]com[employer]ee.html  Additional Support:   Sometimes it can be challenging to manage all aspects of daily life. Finding the right support can help you maintain or improve your health and independence. Please let me know if you would like to talk further about finding resources to assist you.

## 2022-02-27 ENCOUNTER — Encounter (INDEPENDENT_AMBULATORY_CARE_PROVIDER_SITE_OTHER): Payer: Self-pay

## 2022-03-30 ENCOUNTER — Encounter (INDEPENDENT_AMBULATORY_CARE_PROVIDER_SITE_OTHER): Payer: Self-pay

## 2022-04-29 ENCOUNTER — Encounter (INDEPENDENT_AMBULATORY_CARE_PROVIDER_SITE_OTHER): Payer: Self-pay

## 2022-05-05 ENCOUNTER — Other Ambulatory Visit: Payer: Self-pay | Admitting: Internal Medicine

## 2022-05-30 ENCOUNTER — Encounter (INDEPENDENT_AMBULATORY_CARE_PROVIDER_SITE_OTHER): Payer: Self-pay

## 2022-06-28 ENCOUNTER — Other Ambulatory Visit (INDEPENDENT_AMBULATORY_CARE_PROVIDER_SITE_OTHER): Payer: Self-pay | Admitting: Internal Medicine

## 2022-06-29 ENCOUNTER — Other Ambulatory Visit (INDEPENDENT_AMBULATORY_CARE_PROVIDER_SITE_OTHER): Payer: Self-pay | Admitting: Internal Medicine

## 2022-06-29 DIAGNOSIS — I10 Essential (primary) hypertension: Secondary | ICD-10-CM

## 2022-06-29 NOTE — Telephone Encounter (Signed)
LV- 02/16/22 advised to "Return in about 6 months (around 08/18/2022) for follow up."      NV- 08/18/22    Rx refilled.

## 2022-06-30 ENCOUNTER — Encounter (INDEPENDENT_AMBULATORY_CARE_PROVIDER_SITE_OTHER): Payer: Self-pay

## 2022-07-01 ENCOUNTER — Other Ambulatory Visit (INDEPENDENT_AMBULATORY_CARE_PROVIDER_SITE_OTHER): Payer: Self-pay | Admitting: Internal Medicine

## 2022-07-01 DIAGNOSIS — E782 Mixed hyperlipidemia: Secondary | ICD-10-CM

## 2022-07-30 ENCOUNTER — Encounter (INDEPENDENT_AMBULATORY_CARE_PROVIDER_SITE_OTHER): Payer: Self-pay

## 2022-08-18 ENCOUNTER — Ambulatory Visit (INDEPENDENT_AMBULATORY_CARE_PROVIDER_SITE_OTHER): Payer: Medicare Other | Admitting: Internal Medicine

## 2022-08-30 ENCOUNTER — Encounter (INDEPENDENT_AMBULATORY_CARE_PROVIDER_SITE_OTHER): Payer: Self-pay

## 2022-09-26 ENCOUNTER — Ambulatory Visit (INDEPENDENT_AMBULATORY_CARE_PROVIDER_SITE_OTHER): Payer: Medicare Other | Admitting: Internal Medicine

## 2022-09-29 ENCOUNTER — Encounter (INDEPENDENT_AMBULATORY_CARE_PROVIDER_SITE_OTHER): Payer: Self-pay

## 2022-09-30 ENCOUNTER — Other Ambulatory Visit (INDEPENDENT_AMBULATORY_CARE_PROVIDER_SITE_OTHER): Payer: Self-pay | Admitting: Internal Medicine

## 2022-09-30 DIAGNOSIS — I10 Essential (primary) hypertension: Secondary | ICD-10-CM

## 2022-10-02 NOTE — Telephone Encounter (Signed)
LV- 02/16/22 advised to "Return in about 6 months (around 08/18/2022) for follow up. "

## 2022-10-12 ENCOUNTER — Encounter (INDEPENDENT_AMBULATORY_CARE_PROVIDER_SITE_OTHER): Payer: Self-pay | Admitting: Internal Medicine

## 2022-10-30 ENCOUNTER — Encounter (INDEPENDENT_AMBULATORY_CARE_PROVIDER_SITE_OTHER): Payer: Self-pay

## 2022-11-24 ENCOUNTER — Encounter (INDEPENDENT_AMBULATORY_CARE_PROVIDER_SITE_OTHER): Payer: Self-pay | Admitting: Internal Medicine

## 2022-11-24 ENCOUNTER — Ambulatory Visit (INDEPENDENT_AMBULATORY_CARE_PROVIDER_SITE_OTHER): Payer: Medicare Other | Admitting: Internal Medicine

## 2022-11-24 VITALS — BP 128/79 | HR 58 | Temp 97.6°F | Wt 217.0 lb

## 2022-11-24 DIAGNOSIS — E782 Mixed hyperlipidemia: Secondary | ICD-10-CM

## 2022-11-24 DIAGNOSIS — I10 Essential (primary) hypertension: Secondary | ICD-10-CM

## 2022-11-24 DIAGNOSIS — M79642 Pain in left hand: Secondary | ICD-10-CM

## 2022-11-24 MED ORDER — LISINOPRIL 5 MG PO TABS
5.0000 mg | ORAL_TABLET | Freq: Every day | ORAL | 3 refills | Status: DC
Start: 2022-11-24 — End: 2023-01-05

## 2022-11-24 NOTE — Progress Notes (Signed)
Subjective:      Date: 11/24/2022 3:12 PM   Patient ID: Barbara Rubio is a 72 y.o. female.    Chief Complaint:  Chief Complaint   Patient presents with    Hypertension     Discuss BP        HPI:  HPI    HYPERTENSION  Visit type: routine follow-up of HTN  Type: essential  Associated Conditions: no evidence of CHF  Compliance: is compliant with the current regimen  Comorbid illness: none  Interval events: no interval events  Interval symptoms: none  Patient denies: dizziness, chest pain, shortness of breath, palpitations, pre-syncope, edema, DOE, orthopnea or PND  Average ambulatory systolic pressure: does not check ambulatory blood pressure  Average ambulatory diastolic pressure: does not check ambulatory blood pressure  End organ damage from HTN: none  Response to medications: has been good  Prior testing: a chemistry profile showed normal renal function  Additional concerns: In addition, pt also needs medication refill.    Pt has a history of hyperlipidemia and is compliant with anti-lipidemic therapy.  Pt denies side effects of anti-lipidemic therapy - specifically denies myalgia.    -She has gained some weight recently  She is overdue to follow-up with her bariatric surgeon  Will make an appointment soon    Problem List:  Patient Active Problem List   Diagnosis    Essential hypertension    Mixed hyperlipidemia    Gastroesophageal reflux disease    Hiatal hernia    Vitamin D deficiency    S/P laparoscopic sleeve gastrectomy    Obesity (BMI 30-39.9)    LUQ abdominal pain    Chronic constipation       Current Medications:  Outpatient Medications Marked as Taking for the 11/24/22 encounter (Office Visit) with Marrian Salvage, MD   Medication Sig Dispense Refill    atorvastatin (LIPITOR) 10 MG tablet Take 1 tablet (10 mg) by mouth every evening TAKE 1 TABLET BY MOUTH EVERY DAY AT NIGHT 90 tablet 3    estradiol (ESTRACE) 0.1 MG/GM vaginal cream PLACE 1 G VAGINALLY TWICE A WEEK TWICE A WEEK - USES DOSE MONDAYS AND  WEDNESDAYS 42.5 g 0    Multiple Vitamin (multivitamin) capsule Take 1 capsule by mouth daily      zinc gluconate 50 MG tablet Take 1 tablet (50 mg) by mouth daily      [DISCONTINUED] lisinopril (ZESTRIL) 5 MG tablet TAKE 1 TABLET (5 MG TOTAL) BY MOUTH DAILY. 90 tablet 0       Allergies:  No Known Allergies    Past Medical History:  Past Medical History:   Diagnosis Date    Abnormal vision     wears glasses    Constipation     HX - HAS BEEN MORE PREVELANT POST BARIATRIC SURGERY.    COVID-19 10/2021    HX - 21//23 URGENT CARE NOTE IN EPIC.    Fibroids     PT HAD A PARTIAL HYSTERECTOMY - OVARIES ARE STILL IN PLACE.    Gastroesophageal reflux disease 10/23/2013    HX.    Hyperlipidemia     MANAGED WITH MEDS.    Hypertension     PT STATES HE BP RUNS FROM 128-130/70s. MANAGED WITH MEDS - FOLLOWED BY PCP.    Left wrist fracture 2018    HX    LUQ pain     HX    Psoriatic arthritis     HX - LOWER BACK, LEFT HAND, WRIST AND FINGERS  Urinary incontinence 12/21/2017    HX OVERACTIVE BLADDER. ISSUE RESOLVED POST BARIATRIC SURGERY - PT STATED SHE LOST 65 LBS.       Past Surgical History:  Past Surgical History:   Procedure Laterality Date    BUNIONECTOMY Left     COLONOSCOPY      X 2     COLONOSCOPY N/A 04/13/2021    Procedure: COLONOSCOPY;  Surgeon: Collier Salina, MD;  Location: [facility];  Service: Gastroenterology;  Laterality: N/A;    EGD N/A 05/18/2020    Procedure: EGD w/ bx's;  Surgeon: Lenora Boys, MD;  Location: [facility];  Service: Gastroenterology;  Laterality: N/A;    EGD  12/2020    EGD N/A 01/24/2022    Procedure: EGD w/ Bx's;  Surgeon: Lenora Boys, MD;  Location: [facility];  Service: Gastroenterology;  Laterality: N/A;    HYSTERECTOMY      PARTIAL - OVARIES IN PLACE.    LAPAROSCOPIC GASTRECTOMY SLEEVE WITH HIATAL HERNIA N/A 07/28/2020    Procedure: LAPAROSCOPIC GASTRECTOMY SLEEVE WITH HIATAL HERNIA, OMENTOPEXY, LAPAROSCOPIC RESECTION OF ANTRAL AND FUNDIC GASTRIC MASS;  Surgeon:  Santa Genera R, DO;  Location: [facility];  Service: General;  Laterality: N/A;       Family History:  Family History   Problem Relation Age of Onset    Hypertension Mother     Hypertension Father     Hypertension Sister     Hypertension Brother     Migraines Neg Hx     Stroke Neg Hx     Seizures Neg Hx        Social History:  Social History     Socioeconomic History    Marital status: Divorced   Tobacco Use    Smoking status: Never    Smokeless tobacco: Never    Tobacco comments:     Never smoked or used tobacco   Vaping Use    Vaping Use: Never used   Substance and Sexual Activity    Alcohol use: Yes     Comment: 1-2 DRINKS MONTHLY    Drug use: Never   Other Topics Concern    Dietary supplements / vitamins Yes    Anesthesia problems No    Blood thinners No    Pregnant No    Future Children No    Number of Pregnancies? Yes     Comment: 1    Number of children Yes     Comment: 1    Miscarriages / Abortions? No    Eats large amounts No    Excessive Sweets Yes    Skips meals No    Eats excessive starches Yes    Snacks or grazes Yes    Emotional eater No    Eats fried food Yes    Eats fast food Yes    Diet Center No    [facility] Patience Yes    [facility] No    Nutri-System Yes    Opti-Fast / Medi-Fast No    Overeaters Anonymous No    [facility] No    TOPS No    Weight Watchers Yes    Atkins No    Binging / Purging No    Calorie Counting Yes    Fasting No    High Protein Yes    Low Carb Yes    Low Fat Yes    [facility] Diet No    Slim Fast No  [facility] Yes    Stationary cycle or treadmill No    Gym/fitness Classes No    Home exercise/video No    Swimming No    Weight training No    Walking or running Yes    Hospitalization No    Hypnosis No    Physical therapy No    Psychological therapy No    Residential program No    Acutrim No    [place] No    Contrave No    Dexatrim No    Diethylpropion No    Fastin No    Fen - Phen No    Ionamin / Adipex No    Phentermine No    Qsymia No    Prozac  No    Saxenda No    Topamax No    Wellbutrin No    Xenical (Orlistat, Alli) No    Other Med No    No impairment No    Walks with cane/crutch No    Requires a wheelchair No    Bedridden No    Are you currently being treated for depression? No    Do you snore? Yes    Are you receiving any medical or psychological services? No    Do you ever wake up at night gasping for breath? Yes    Do you have or have you been treated for an eating disorder? No    Anyone ever told you that you stop breathing while asleep? No    Do you exercise regularly? No    Have you or family member ever have trouble with anesthesia? Yes     Social Determinants of Health     Financial Resource Strain: Medium Risk (11/23/2022)    Overall Financial Resource Strain (CARDIA)     Difficulty of Paying Living Expenses: Somewhat hard   Food Insecurity: No Food Insecurity (11/23/2022)    Hunger Vital Sign     Worried About Running Out of Food in the Last Year: Never true     Ran Out of Food in the Last Year: Never true   Transportation Needs: No Transportation Needs (11/23/2022)    PRAPARE - Armed forces logistics/support/administrative officer (Medical): No     Lack of Transportation (Non-Medical): No   Physical Activity: Insufficiently Active (11/23/2022)    Exercise Vital Sign     Days of Exercise per Week: 2 days     Minutes of Exercise per Session: 50 min   Stress: No Stress Concern Present (11/23/2022)    [place]     Feeling of Stress : Only a little   Social Connections: Moderately Integrated (11/23/2022)    Social Connection and Isolation Panel [NHANES]     Frequency of Communication with Friends and Family: More than three times a week     Frequency of Social Gatherings with Friends and Family: Three times a week     Attends Religious Services: More than 4 times per year     Active Member of Clubs or Organizations: Yes     Attends Archivist Meetings: More than 4 times per year     Marital  Status: Divorced   Intimate Partner Violence: Not At Risk (11/23/2022)    Humiliation, Afraid, Rape, and Kick questionnaire     Fear of Current or Ex-Partner: No     Emotionally Abused: No     Physically Abused: No     Sexually  Abused: No   Housing Stability: Low Risk  (11/23/2022)    Housing Stability Vital Sign     Unable to Pay for Housing in the Last Year: No     Number of Places Lived in the Last Year: 1     Unstable Housing in the Last Year: No        The following sections were reviewed this encounter by the provider:   Tobacco  Allergies  Meds  Problems  Med Hx  Surg Hx  Fam Hx         Vitals:  BP 128/79 (BP Site: Left arm, Patient Position: Sitting, Cuff Size: Medium)   Pulse (!) 58   Temp 97.6 F (36.4 C) (Temporal)   Wt 98.4 kg (217 lb)   SpO2 97%   BMI 32.99 kg/m       ROS:  General/Constitutional:   Denies Chills. Denies Fatigue. Denies Fever.   Ophthalmologic:   Denies Blurred vision.   ENT:   Denies Nasal Discharge. Denies Sinus pain. Denies Sore throat.   Respiratory:   Denies Cough. Denies Shortness of breath. Denies Wheezing.   Cardiovascular:   Denies Chest pain. Denies Chest pain with exertion. Denies Palpitations. Denies  Swelling in hands/feet.   Gastrointestinal:   Denies Abdominal pain. Denies Constipation. Denies Diarrhea. Denies Nausea. Denies  Vomiting.   Skin:   Denies Rash.   Neurologic:   Denies Dizziness. Denies Headache. Denies Tingling/Numbness.        Objective:       Physical Exam:  General Examination:   GENERAL APPEARANCE: alert, in no acute distress, well developed, well nourished,  oriented to time, place, and person.   NECK/THYROID: neck supple, no carotid bruit, carotid pulse 2+ bilaterally, no cervical  lymphadenopathy, no neck mass palpated, no jugular venous distention, no  thyromegaly.   HEART: S1, S2 normal, no murmurs, rubs, gallops, regular rate and rhythm.   LUNGS: normal effort / no distress, normal breath sounds, clear to auscultation  bilaterally, no  wheezes, rales, rhonchi.   EXTREMITIES: no clubbing, cyanosis, or edema bilaterally.   PERIPHERAL PULSES: 2+ dorsalis pedis, 2+ posterior tibial bilaterally.   NEUROLOGIC: alert and oriented.        Assessment/Plan:       1. Essential hypertension  - lisinopril (ZESTRIL) 5 MG tablet; Take 1 tablet (5 mg) by mouth daily  Dispense: 90 tablet; Refill: 3  - Comprehensive metabolic panel; Future  Stable continue current medication  2. Mixed hyperlipidemia  - Comprehensive metabolic panel; Future  - Lipid panel; Future  Stable continue current medication  3. Left hand pain  - Ambulatory referral to Physical Therapy; Future          Marrian Salvage, MD

## 2022-11-24 NOTE — Progress Notes (Signed)
Have you seen any specialists since your last visit with us?  No      The patient was informed that the following HM items are still outstanding:   shingles vaccine

## 2022-11-29 ENCOUNTER — Other Ambulatory Visit (INDEPENDENT_AMBULATORY_CARE_PROVIDER_SITE_OTHER): Payer: Self-pay | Admitting: Internal Medicine

## 2022-11-30 ENCOUNTER — Encounter (INDEPENDENT_AMBULATORY_CARE_PROVIDER_SITE_OTHER): Payer: Self-pay

## 2022-11-30 ENCOUNTER — Other Ambulatory Visit (FREE_STANDING_LABORATORY_FACILITY): Payer: Medicare Other

## 2022-11-30 DIAGNOSIS — I10 Essential (primary) hypertension: Secondary | ICD-10-CM

## 2022-11-30 DIAGNOSIS — E782 Mixed hyperlipidemia: Secondary | ICD-10-CM

## 2022-11-30 LAB — COMPREHENSIVE METABOLIC PANEL
ALT: 11 U/L (ref 0–55)
AST (SGOT): 16 U/L (ref 5–41)
Albumin/Globulin Ratio: 1.2 (ref 0.9–2.2)
Albumin: 3.7 g/dL (ref 3.5–5.0)
Alkaline Phosphatase: 86 U/L (ref 37–117)
Anion Gap: 7 (ref 5.0–15.0)
BUN: 11 mg/dL (ref 7.0–21.0)
Bilirubin, Total: 0.6 mg/dL (ref 0.2–1.2)
CO2: 25 mEq/L (ref 17–29)
Calcium: 9.6 mg/dL (ref 7.9–10.2)
Chloride: 108 mEq/L (ref 99–111)
Creatinine: 0.8 mg/dL (ref 0.4–1.0)
Globulin: 3 g/dL (ref 2.0–3.6)
Glucose: 89 mg/dL (ref 70–100)
Potassium: 4.8 mEq/L (ref 3.5–5.3)
Protein, Total: 6.7 g/dL (ref 6.0–8.3)
Sodium: 140 mEq/L (ref 135–145)
eGFR: >60 mL/min/{1.73_m2} (ref >=60)

## 2022-11-30 LAB — LIPID PANEL
Cholesterol / HDL Ratio: 3.4 Index
Cholesterol: 224 mg/dL — ABNORMAL HIGH (ref 0–199)
HDL: 66 mg/dL (ref 40–9999)
LDL Calculated: 136 mg/dL — ABNORMAL HIGH (ref 0–99)
Triglycerides: 109 mg/dL (ref 34–149)
VLDL Calculated: 22 mg/dL (ref 10–40)

## 2022-11-30 LAB — HEMOLYSIS INDEX: Hemolysis Index: 7 Index (ref 0–24)

## 2022-11-30 NOTE — Telephone Encounter (Signed)
LV- 11/24/22    NV- 02/27/23    RX Pended, please advise and refill or reject if appropriate.       Lab Results   Component Value Date    WBC 4.84 01/17/2022    HGB 11.3 (L) 01/17/2022    HCT 37.6 01/17/2022    PLT 182 01/17/2022    CHOL 201 (H) 11/12/2020    TRIG 86 11/12/2020    HDL 54 11/12/2020    LDL 130 (H) 11/12/2020    ALT 11 05/27/2021    AST 14 05/27/2021    NA 145 01/17/2022    K 4.5 01/17/2022    CL 110 01/17/2022    CREAT 0.8 01/17/2022    BUN 12.0 01/17/2022    CO2 26 01/17/2022    TSH 1.45 11/12/2020    INR 1.0 06/09/2020    GLU 86 01/17/2022    HGBA1C 5.5 06/09/2020

## 2022-12-29 ENCOUNTER — Encounter (INDEPENDENT_AMBULATORY_CARE_PROVIDER_SITE_OTHER): Payer: Self-pay

## 2023-01-01 ENCOUNTER — Encounter (HOSPITAL_BASED_OUTPATIENT_CLINIC_OR_DEPARTMENT_OTHER): Payer: Self-pay | Admitting: Family

## 2023-01-01 ENCOUNTER — Ambulatory Visit (INDEPENDENT_AMBULATORY_CARE_PROVIDER_SITE_OTHER): Payer: Medicare Other | Admitting: Family

## 2023-01-01 VITALS — BP 146/82 | HR 67 | Temp 98.0°F | Ht 68.0 in | Wt 214.0 lb

## 2023-01-01 DIAGNOSIS — Z1321 Encounter for screening for nutritional disorder: Secondary | ICD-10-CM

## 2023-01-01 DIAGNOSIS — E569 Vitamin deficiency, unspecified: Secondary | ICD-10-CM

## 2023-01-01 DIAGNOSIS — Z713 Dietary counseling and surveillance: Secondary | ICD-10-CM

## 2023-01-01 DIAGNOSIS — Z9884 Bariatric surgery status: Secondary | ICD-10-CM

## 2023-01-01 DIAGNOSIS — I10 Essential (primary) hypertension: Secondary | ICD-10-CM

## 2023-01-01 DIAGNOSIS — Z8639 Personal history of other endocrine, nutritional and metabolic disease: Secondary | ICD-10-CM

## 2023-01-01 NOTE — Progress Notes (Signed)
Progress Note    Patient Name: Barbara Rubio, Barbara Rubio  Age: 72 y.o.  Sex: female   DOB: 12/12/1950  MRN: [id number]    HPI:   Jeretta Bezerra returns for follow up 2.5 years post Laparoscopic Sleeve Gastrectomy .   She is doing well overall.    Denies any nausea, vomiting, diarrhea, constipation, reflux, abdominal pain.   Tolerating a regular diet.   Vitamins - Taking regularly.   Portion sizes - about 6-8 oz. She complains of hunger and is eating about 6 times a day.   Fluids - 40-45 oz.  Protein supplements - 0 grams.  Physical activity - Walks 3000-4000 steps daily at work.   She has had 34 lbs total weight loss.     Allergies:   No Known Allergies    Review of Systems:   As per the HPI.    Physical Exam:   BP 146/82 (BP Site: Right arm, Patient Position: Sitting, Cuff Size: Large)   Pulse 67   Temp 98 F (36.7 C) (Temporal)   Ht '5\' 8"'$    Wt 214 lb   BMI 32.54 kg/m   BMI: Body mass index is 32.54 kg/m.  Previous Weight:   Wt Readings from Last 15 Encounters:   01/01/23 214 lb   11/24/22 217 lb   02/16/22 203 lb   01/24/22 198 lb 1.6 oz   01/16/22 199 lb   10/12/21 200 lb   08/16/21 202 lb   06/10/21 201 lb   04/13/21 201 lb   03/25/21 201 lb   01/07/21 205 lb   11/29/20 206 lb   08/10/20 225 lb   07/28/20 235 lb 6.4 oz   07/20/20 237 lb      Appearance: Comfortable, no acute distress, well nourished  HEENT: Normocephalic, clear conjunctiva   Chest: Breathing unlabored  Neuro: A&Ox3  Psych: Calm, cooperative    Assessment and Plan:   Jackleen Holloway is 2.5 years post Laparoscopic Sleeve Gastrectomy . 34 lbs total weight loss with a 13 lbs weight recurrence. Dietary compliance - Struggling with weight recurrence secondary to hunger.     Problems addressed:  Weight recurrence- Consult with medical weight loss physician.   Consult with Registered Dietitian.     We will continue to monitor.     Portion control  Protein goal: 50-60 gm from supplement daily  Fluid goal: at least 64 oz daily  Activity  as tolerated  F/u w/ PCP routinely  Problem list and obesity-related co-morbidities reviewed. Continue recommended bariatric surgery follow-up plan as noted to improve health outcomes and resolve obesity.  Routine care in 3-6 months with labs or sooner if needed.      Patient verbalized understanding and agrees with the plan.    Return for Medical Weight Loss Consultation, Registered Dietitian, Laverta Baltimore, NP, with labs.    Signed by: Elenor Legato, NP, FNP-BC    This note was generated by the [facility] EMR system/Dragon speech recognition and may contain inherent errors or omissions not intended by the user. Grammatical errors, random word insertions, deletions, pronoun errors and incomplete sentences are occasional consequences of this technology due to software limitations. Not all errors are caught or corrected. If there are questions or concerns about the content of this note or information contained within the body of this dictation they should be addressed directly with the author for clarification.

## 2023-01-03 ENCOUNTER — Other Ambulatory Visit (FREE_STANDING_LABORATORY_FACILITY): Payer: Medicare Other

## 2023-01-03 DIAGNOSIS — I10 Essential (primary) hypertension: Secondary | ICD-10-CM

## 2023-01-03 DIAGNOSIS — Z713 Dietary counseling and surveillance: Secondary | ICD-10-CM

## 2023-01-03 DIAGNOSIS — Z9884 Bariatric surgery status: Secondary | ICD-10-CM

## 2023-01-03 DIAGNOSIS — Z8639 Personal history of other endocrine, nutritional and metabolic disease: Secondary | ICD-10-CM

## 2023-01-03 DIAGNOSIS — Z1321 Encounter for screening for nutritional disorder: Secondary | ICD-10-CM

## 2023-01-03 DIAGNOSIS — E569 Vitamin deficiency, unspecified: Secondary | ICD-10-CM

## 2023-01-03 LAB — VITAMIN B12: Vitamin B-12: 439 pg/mL (ref 211–911)

## 2023-01-03 LAB — HEMOLYSIS INDEX: Hemolysis Index: 1 Index (ref 0–24)

## 2023-01-03 LAB — VITAMIN D,25 OH,TOTAL: Vitamin D, 25 OH, Total: 19 ng/mL — ABNORMAL LOW (ref 30–100)

## 2023-01-03 LAB — FOLATE: Folate: 10.6 ng/mL

## 2023-01-03 LAB — IRON: Iron: 136 ug/dL (ref 32–157)

## 2023-01-04 ENCOUNTER — Encounter (HOSPITAL_BASED_OUTPATIENT_CLINIC_OR_DEPARTMENT_OTHER): Payer: Self-pay | Admitting: Internal Medicine

## 2023-01-04 ENCOUNTER — Ambulatory Visit (INDEPENDENT_AMBULATORY_CARE_PROVIDER_SITE_OTHER): Payer: Medicare Other | Admitting: Internal Medicine

## 2023-01-04 VITALS — BP 169/90 | HR 70 | Temp 98.2°F | Ht 68.0 in | Wt 215.0 lb

## 2023-01-04 DIAGNOSIS — Z9884 Bariatric surgery status: Secondary | ICD-10-CM

## 2023-01-04 DIAGNOSIS — E559 Vitamin D deficiency, unspecified: Secondary | ICD-10-CM

## 2023-01-04 DIAGNOSIS — E6609 Other obesity due to excess calories: Secondary | ICD-10-CM

## 2023-01-04 DIAGNOSIS — I10 Essential (primary) hypertension: Secondary | ICD-10-CM

## 2023-01-04 DIAGNOSIS — E519 Thiamine deficiency, unspecified: Secondary | ICD-10-CM

## 2023-01-04 DIAGNOSIS — Z6832 Body mass index (BMI) 32.0-32.9, adult: Secondary | ICD-10-CM

## 2023-01-04 DIAGNOSIS — E782 Mixed hyperlipidemia: Secondary | ICD-10-CM

## 2023-01-04 MED ORDER — VITAMIN D (ERGOCALCIFEROL) 1.25 MG (50000 UT) PO CAPS
50000.0000 [IU] | ORAL_CAPSULE | ORAL | 0 refills | Status: AC
Start: 2023-01-04 — End: 2023-03-05

## 2023-01-04 MED ORDER — THIAMINE HCL 100 MG PO TABS
100.0000 mg | ORAL_TABLET | Freq: Every day | ORAL | 0 refills | Status: DC
Start: 2023-01-04 — End: 2023-03-29

## 2023-01-04 NOTE — Progress Notes (Signed)
The patient presents today for a consultation at [facility].     Barbara Rubio is a 72 y.o. year old female presenting for evaluation and is interested in non-surgical weight management.     PMH:   HTN  HLD  GERD  Psoriatic arthritis  S/p sleeve gastrectomy 2021, preop wt 235 lb, postop nadir 175 lb (reported)     The patient has struggled with weight since menopause/hysterectomy. Wt prior to this 150s lb. S/p gastric sleeve procedure with wt regain recently. Pt feeling very hungry and snacking more.   Highest adult weight: 260 lb  Current weight: 215 lb  Goal weight is 185-190 lb     The patient has tried the following in the past to lose weight:   Weight watchers  Barbara Rubio   OTC drugs     AOM history/contraindications:   Phentermine/wellbutrin - will avoid with uncontrolled HTN     Specific eating patterns and habits:   Breakfast - [place] sausage + boiled egg/fried egg + 1/2 bagel with cream cheese/cheese (or oatmeal 1/2 cup 3 days per week) + coffee (creamer + cinnamon + nutmeg) OR blueberry muffin (homemade)  Lunch - salad (cabbage, carrots, lettuce)  +/- protein + bleu cheese + 4 crackers + 1/2 cup fresh fruit   Dinner - (light) chicken + collard greens/broccoli + baked potato   Snacks/desserts - cookies or animal crackers or graham crackers or potato chips   Water: admits to not drinking enough water  Sugar Sweetened Beverages? Coffee (as above), ginger ale, cranberry juice    Current Execise: None (4000-5000 steps per day)    The patient is currently employed as Art gallery manager.    The patient has the following comorbid conditions:  Patient Active Problem List   Diagnosis    Essential hypertension    Mixed hyperlipidemia    Gastroesophageal reflux disease    Hiatal hernia    Vitamin D deficiency    S/P laparoscopic sleeve gastrectomy    Obesity (BMI 30-39.9)    LUQ abdominal pain    Chronic constipation     Past Surgical History:   Procedure Laterality Date    BUNIONECTOMY Left      COLONOSCOPY      X 2     COLONOSCOPY N/A 04/13/2021    Procedure: COLONOSCOPY;  Surgeon: Collier Salina, MD;  [facility]: [id number];  Service: Gastroenterology;  Laterality: N/A;    EGD N/A 05/18/2020    Procedure: EGD w/ bx's;  Surgeon: Lenora Boys, MD;  Location: [facility];  Service: Gastroenterology;  Laterality: N/A;    EGD  12/2020    EGD N/A 01/24/2022    Procedure: EGD w/ Bx's;  Surgeon: Lenora Boys, MD;  Location: [facility];  Service: Gastroenterology;  Laterality: N/A;    HYSTERECTOMY      PARTIAL - OVARIES IN PLACE.    LAPAROSCOPIC GASTRECTOMY SLEEVE WITH HIATAL HERNIA N/A 07/28/2020    Procedure: LAPAROSCOPIC GASTRECTOMY SLEEVE WITH HIATAL HERNIA, OMENTOPEXY, LAPAROSCOPIC RESECTION OF ANTRAL AND FUNDIC GASTRIC MASS;  Surgeon: Jed Limerick, DO;  Location: [facility];  Service: General;  Laterality: N/A;     Family History   Problem Relation Age of Onset    Hypertension Mother     Hypertension Father     Hypertension Sister     Hypertension Brother     Migraines Neg Hx     Stroke Neg Hx     Seizures Neg Hx  Current Outpatient Medications   Medication Sig Dispense Refill    atorvastatin (LIPITOR) 10 MG tablet Take 1 tablet (10 mg) by mouth every evening TAKE 1 TABLET BY MOUTH EVERY DAY AT NIGHT 90 tablet 3    estradiol (ESTRACE) 0.1 MG/GM vaginal cream PLACE 1 G VAGINALLY TWICE A WEEK TWICE A WEEK - USES DOSE MONDAYS AND WEDNESDAYS 42.5 g 0    lisinopril (ZESTRIL) 5 MG tablet Take 1 tablet (5 mg) by mouth daily 90 tablet 3    Multiple Vitamin (multivitamin) capsule Take 1 capsule by mouth daily      zinc gluconate 50 MG tablet Take 1 tablet (50 mg) by mouth daily       No current facility-administered medications for this visit.       Obesity ROS:  Contraindications to weight loss medications:   Nephrolithiasis: No  Seizures: No  Personal history of pancreatitis: No  Glaucoma/increase pressure in eyes: No    History of psychiatric treatment: No  History of  drug abuse: No  History of or current alcohol use: 1 Drinks per week  History of disordered eating requiring treatment: No  Concerns about disordered eating behavior now: No    Currently has pregnancy potential: NO- PMP, s/p partial hysterectomy  History of issues with PCOS symptoms (hirsuitism, acne, or menstrual irregularity): No  History of treatment for PCOS: No    Diagnosis of OSA: No  If yes, compliant with CPAP: N/A  Symptoms of sleep apnea (daytime somnolence, snoring, witnessed apnea): snoring when very tired, feels well rested     Family history of medullary thyroid cancer/MEN syndrome: No  Pancreatic cancer: Mother   Family history of T2DM: None    Physical Exam:  Constitutional: NAD, Looks well, able to respond appropriately to questions  HEENT: NCAT, anicteric sclera  Resp: able to complete full sentences, respirations unlabored, CTA without wheezes/rhonchi/rales  Cardio: RRR, no murmurs  Abd: Obese, soft  Neuro: alert and oriented  Psych: appropriate judgement, mood, and thought content     Assessment/Plan:    The 10-year ASCVD risk score (Arnett DK, et al., 2019) is: 21%    Values used to calculate the score:      Age: 89 years      Sex: Female      Is Non-Hispanic African American: No      Diabetic: No      Tobacco smoker: No      Systolic Blood Pressure: 123XX123 mmHg      Is BP treated: Yes      HDL Cholesterol: 66 mg/dL      Total Cholesterol: 224 mg/dL    Need to address comorbid conditions/risks?  Sleep: No  NAFLD: No  Lipids: No  CHF: No  Diabetes: No  Genetic testing recommended: No    Relevant labs 2-12/2022 reviewed by me.    1. Class 1 obesity due to excess calories with serious comorbidity and body mass index (BMI) of 32.0 to 32.9 in adult        2. Essential hypertension        3. Mixed hyperlipidemia        4. S/P laparoscopic sleeve gastrectomy            PLAN TODAY:    Obesity -   Provided nutrition/exercise guidance (see AVS).   Discussed options for AOM. Will avoid medications that may  increase BP as BP uncontrolled (phentermine/wellbutrin). We discussed zepbound and topiramate. Information for both provided. Pt to review  and let me know if interested.     HTN -   Uncontrolled recently  Pt to monitor BP at home and reach out with results   Eating a lot of high sodium foods     HLD -   On statin     S/p sleeve gastrectomy -   Nutrition labs done this month    Thiamine deficiency -   Supplement ordered as well as repeat blood test    Vitamin D deficiency -   Supplement ordered as well as repeat blood test      Follow up:  10 weeks     Jamelle Haring, MD 01/04/2023 10:12 AM

## 2023-01-04 NOTE — Patient Instructions (Addendum)
It was so nice to meet you! Please feel free to send me a message on Mychart if you have any questions or concerns.     If we ordered blood tests, please get your blood drawn for lab work. For most tests we ask that you fast (no food) for 8 hours before labs, but you can drink water and take medicines.     It is important for you to track your food intake. I would use an app like myfitnesspal, lose it, or It is important for you to track your food intake. I would use an app like myfitnesspal, lose it, or baritastic.   Get at least 80-100 grams of protein per day  Aim for less than 100 grams of carbohydrates per day. Primarily you should be getting carbohydrates from non-starchy vegetables, 1-2 servings of fruit daily, beans, legumes, and whole grains. Eventually we can gradually decrease your carbohydrates depending on how you respond.   The rest of your calories from healthy fats such as fish (tuna, salmon, herring), avocado, eggs, seeds/nuts, dairy (yogurt without sugar added, cheese, sour cream), olives/olive oil, etc. You should eat these foods to help with satiety, but you don't have to add them in if you already feel satisfied.     General advice for losing fat and not muscle-   When you say that you would like to "lose weight," you really mean that you would like to lose fat. We call this adipose tissue. However, when you lose weight you can also lose other types of tissue. Initially when you start changing your diet you lose "water weight." As you eat less calories than you burn, your body will use the carbohydrate that is stored in your muscle and liver for fuel. This is called glycogen, and glycogen is stored with water. So when the glycogen is used, the water is released.     We do not want you to lose muscle or bone when you are losing weight. There is only so much you can do to prevent this, so it is very important to do what you can. The following will help reduce muscle and bone loss.     1. Have  enough vitamin D to protect your bones. Many patients with excess weight have low vitamin D levels. Taking a supplement can help. Often we will check your level and recommend a supplement.    2. Eat enough protein. The types of "calories" that you eat matter. Protein in your diet is used to repair your muscle tissue, and to make the building blocks for a lot of other things in your body like hormones and enzymes. It is also an important signal that tells your body that you are getting enough overall nutrition.   - If you do not eat enough protein in your diet, your body will use the protein it already has to do these things. This means that you can start to break down the muscle tissue to get those building blocks.   - When you lose muscle, not only are you weaker, but you are losing the tissue that is the most "active" and able to burn calories, so you are more likely to regain weight when you increase your food intake again. You also are less able to use the glucose (sugar/starches/carbohydrates) that you eat, which can cause other health issues.   - Most men should eat AT LEAST 100 grams of protein per day, and most women should eat AT LEAST 80 grams  of protein per day. But this can change depending on kidney disease and other medical conditions, so please ask your doctor or dietician about your individual protein goal.     3. Exercise. Activating your muscles with exercise is important to signal to your body that it needs your muscle tissue and also tells your bones to stay strong. Research is very clear that exercise helps to prevent losing muscle when you are losing weight.   - both cardio and strength types of exercise help, but strength training may help more. We recommend that you try to do a combination of both types to get the most protection. This also helps your overall health, mobility, and energy levels!  - If you are not sure what you are able to do, please ask your health care team about what types  of exercise are safe for you to do.   __________________________________________    Consider using [employer] protein chips instead of potato chips.     Goal of 64 oz water per day.    Reduce soda and juice. Consider replacing with fresh fruits.     Consider using resistance bands:   Using resistance bands to exercise can be a great way to get strength training for your muscles without the expense or space of having to use dumbbells or machines. Here are some ideas to get you started!    To make progress with muscle strength, you should try to hit each muscle group 2-3 times per week. Even a little will really help!    20 min full body workout  This one uses a slightly different type of resistance band available here:  Long resistance bands- amazon    25 min Mini resistance band workout: (only one band needed)    10 min Resistance band exercises for Seniors- upper body and core    15 min beginner exercises with resistance band    There are many inexpensive brands for these but this is an example of a loop type band:  resistance bands on amazon    _______________________________________    Medication options (see information below):  Zepbound   Topiramate     ___    Zepbound instructions  Savings card information: https://www.zepbound.lilly.com/coverage-savings    Dose information  Start at 2.5 mg once a week for 4 weeks  Then you will take 5.0 mg once a week for 4 weeks    After this we will decide whether or not to continue to increase the dose based on how you are feeling. The dosing goes as below:  7.5 mg for 4 weeks  10 mg for 4 weeks  12.5 mg for 4 weeks  15 mg from then on.      If you increase the dose and have severe nausea, vomiting or other symptoms, you can decrease to the previous dose you tolerated until you are feeling better. As long as you feel that it is helping with your appetite, you can stay on the lower dose as long as you need to. You will just need to let us know so we can change your prescription.       This medication will help you to feel less hungry over all and also help you feel full quickly when you start eating. The most common side effect is nausea, and is worse when you continue to eat after you get the "full" feeling. The other most common side effects are diarrhea, vomiting, constipation, dyspepsia, and abdominal pain.  If you have severe abdominal pain or other symptoms that concern you, please call us or go to the Emergency Room. Other things to watch out for would be changes in vision, or changes in how much you are using the bathroom (urine). You can have damage to your kidneys if you get severely dehydrated, so you should stop the medicine if you are not able to drink enough.      You should rotate the site of injection. It should not matter what time of day you take the injection, but you should take it the same day of the week every week.     For instructions for how to give yourself the injection, please click the link below:   https://pi.lilly.com/us/zepbound-pen-us-ifu.pdf?s=ug      To make sure that you don't lose muscle, you need to make sure you eat enough protein and you exercise as much as you can. If you have questions about how to do this, please ask [place] and we can give you specific instructions! It is harder to eat enough protein if you are not very hungry or can't eat much, so this is the most important habit that we need to develop. If you lose muscle, it will make it easier to regain weight, and also make you weaker and effect other functions of your body.     _____    The Topiramate is a type of medication that decreases mental appetite. This hopefully helps you to think/worry about food less, and have more control. Most people feel more "satisfied" by the food they eat.     Take topiramate once a day in the evening initially. After a week if you are having no issues but feel that you need more help you can add a second tablet.      It can sometimes cause numbness or  tingling in the fingers. You should drink plenty of water on this medication to prevent kidney stone formation, which is more common in people who take it. As with many medications, you should monitor closely for any changes in your mood (feeling more down/depressed, anxious, etc), and if you notice any problems with this you should stop the medication and let your doctor know right away. Topiramate has some mood stabilization properties, so usually it does not cause ups or downs, but it works in the brain and everyone will respond differently to this. If you feel unsafe, you should seek emergency care right away by calling 911.     It is imperative that you avoid pregnancy while on this medication, as it can cause birth defects. You should use a reliable method of birth control, like birth control pills or an IUD if possible. If that is not a good option for you after talking to your doctor, you should be consistent with using a condom every time. If you have any reason to believe you are pregnant, stop the medication immediately.   High doses can interfere with birth control, but based on the studies that have been done we don't really worry about this unless your topiramate dose is 200 mg per day or above.     https://api.meducation.com/V2.0/fdbpem/6019

## 2023-01-05 ENCOUNTER — Encounter (INDEPENDENT_AMBULATORY_CARE_PROVIDER_SITE_OTHER): Payer: Self-pay | Admitting: Specialist

## 2023-01-05 ENCOUNTER — Telehealth (INDEPENDENT_AMBULATORY_CARE_PROVIDER_SITE_OTHER): Payer: Medicare Other | Admitting: Specialist

## 2023-01-05 DIAGNOSIS — I10 Essential (primary) hypertension: Secondary | ICD-10-CM

## 2023-01-05 DIAGNOSIS — E782 Mixed hyperlipidemia: Secondary | ICD-10-CM

## 2023-01-05 LAB — COPPER, SERUM: Copper: 117 ug/dL (ref 70–175)

## 2023-01-05 LAB — ZINC: Zinc: 80 ug/dL (ref 60–130)

## 2023-01-05 MED ORDER — LISINOPRIL 10 MG PO TABS
10.0000 mg | ORAL_TABLET | Freq: Every day | ORAL | 5 refills | Status: DC
Start: 2023-01-05 — End: 2023-07-24

## 2023-01-05 NOTE — Progress Notes (Unsigned)
Have you seen any specialists since your last visit with us?  Yes      The patient was informed that the following HM items are still outstanding:   shingles vaccine

## 2023-01-05 NOTE — Progress Notes (Unsigned)
Subjective:      Verbal consent has been obtained from the patient to conduct a video and telephone visit to minimize exposure to COVID-19: yes     Date: 01/05/2023 6:05 PM   Patient ID: Barbara Rubio is a 72 y.o. female    Chief Complaint:  Chief Complaint   Patient presents with    Hypertension       HPI: Barbara Rubio is a 72 y.o. pleasant female is here today for virtual visit here today for follow-up for chronic conditions.    Pt has essential HTN with no evidence of CHF or proteinuria.  Home blood pressures are around mid 140s.  The patient is compliant with anti-hypertensive therapy and denies side effects to therapy.  Pt denies CP, SOB, dizziness, orthopnea, PND or edema.  Pt has hyperlipidemia and is compliant with lipid therapy.  Pt denies side effects of lipid therapy - specifically denies myalgia.          Problem List:  Patient Active Problem List   Diagnosis    Essential hypertension    Mixed hyperlipidemia    Gastroesophageal reflux disease    Hiatal hernia    Vitamin D deficiency    S/P laparoscopic sleeve gastrectomy    Obesity (BMI 30-39.9)    LUQ abdominal pain    Chronic constipation       Current Medications:  Outpatient Medications Marked as Taking for the 01/05/23 encounter (Telemedicine Visit) with Deyjah Kindel, Marcie Mowers, MD   Medication Sig Dispense Refill    atorvastatin (LIPITOR) 10 MG tablet Take 1 tablet (10 mg) by mouth every evening TAKE 1 TABLET BY MOUTH EVERY DAY AT NIGHT 90 tablet 3    estradiol (ESTRACE) 0.1 MG/GM vaginal cream PLACE 1 G VAGINALLY TWICE A WEEK TWICE A WEEK - USES DOSE MONDAYS AND WEDNESDAYS 42.5 g 0    Multiple Vitamin (multivitamin) capsule Take 1 capsule by mouth daily      thiamine (B-1) 100 MG tablet Take 1 tablet (100 mg) by mouth daily 90 tablet 0    vitamin D, ergocalciferol, (DRISDOL) 50000 UNIT Cap Take 1 capsule (50,000 Units) by mouth once a week 8 capsule 0    zinc gluconate 50 MG tablet Take 1 tablet (50 mg) by mouth daily      [DISCONTINUED]  lisinopril (ZESTRIL) 5 MG tablet Take 1 tablet (5 mg) by mouth daily 90 tablet 3       Allergies:  No Known Allergies    Past Medical History:  Past Medical History:   Diagnosis Date    Abnormal vision     wears glasses    Constipation     HX - HAS BEEN MORE PREVELANT POST BARIATRIC SURGERY.    COVID-19 10/2021    HX - 21//23 URGENT CARE NOTE IN EPIC.    Fibroids     PT HAD A PARTIAL HYSTERECTOMY - OVARIES ARE STILL IN PLACE.    Gastroesophageal reflux disease 10/23/2013    HX.    Hyperlipidemia     MANAGED WITH MEDS.    Hypertension     PT STATES HE BP RUNS FROM 128-130/70s. MANAGED WITH MEDS - FOLLOWED BY PCP.    Left wrist fracture 2018    HX    LUQ pain     HX    Psoriatic arthritis     HX - LOWER BACK, LEFT HAND, WRIST AND FINGERS    Urinary incontinence 12/21/2017    HX OVERACTIVE BLADDER. ISSUE RESOLVED  POST BARIATRIC SURGERY - PT STATED SHE LOST 65 LBS.       Past Surgical History:  Past Surgical History:   Procedure Laterality Date    BUNIONECTOMY Left     COLONOSCOPY      X 2     COLONOSCOPY N/A 04/13/2021    Procedure: COLONOSCOPY;  Surgeon: Collier Salina, MD;  Location: [id number];  Service: Gastroenterology;  Laterality: N/A;    EGD N/A 05/18/2020    Procedure: EGD w/ bx's;  Surgeon: Lenora Boys, MD;  Location: [facility];  Service: Gastroenterology;  Laterality: N/A;    EGD  12/2020    EGD N/A 01/24/2022    Procedure: EGD w/ Bx's;  Surgeon: Lenora Boys, MD;  Location: [facility];  Service: Gastroenterology;  Laterality: N/A;    HYSTERECTOMY      PARTIAL - OVARIES IN PLACE.    LAPAROSCOPIC GASTRECTOMY SLEEVE WITH HIATAL HERNIA N/A 07/28/2020    Procedure: LAPAROSCOPIC GASTRECTOMY SLEEVE WITH HIATAL HERNIA, OMENTOPEXY, LAPAROSCOPIC RESECTION OF ANTRAL AND FUNDIC GASTRIC MASS;  Surgeon: Jed Limerick, DO;  Location: [facility];  Service: General;  Laterality: N/A;       Family History:  Family History   Problem Relation Age of Onset    Hypertension Mother      Hypertension Father     Hypertension Sister     Hypertension Brother     Migraines Neg Hx     Stroke Neg Hx     Seizures Neg Hx        Social History:  Social History     Tobacco Use    Smoking status: Never    Smokeless tobacco: Never    Tobacco comments:     Never smoked or used tobacco   Vaping Use    Vaping Use: Never used   Substance Use Topics    Alcohol use: Yes     Comment: 1-2 DRINKS MONTHLY    Drug use: Never          The following sections were reviewed this encounter by the provider:   Tobacco  Allergies  Meds  Problems  Med Hx  Surg Hx  Fam Hx         ROS:    As above in HPI.  All other systems reviewed and are negative.    Objective:     Vitals:  There were no vitals taken for this visit.      Physical Exam      Nursing note reviewed.   Constitutional:       General: He is not in acute distress.     Appearance: Normal appearance.   HENT:      Head: Atraumatic.   Pulmonary:      Effort: No respiratory distress.   Neurological:      Mental Status: He is alert and oriented to person, place, and time.   Psychiatric:         Mood and Affect: Mood normal.   Assessment/Plan:       1. Essential hypertension  - lisinopril (ZESTRIL) 10 MG tablet; Take 1 tablet (10 mg) by mouth daily  Dispense: 30 tablet; Refill: 5    Increase lisinopril to 10 mg daily.  Monitor ambulatory blood pressures twice daily and keep a log.  Follow-up in 2 to 3 weeks for reevaluation.     continue to optimize low salt diet and aerobic exercise efforts. and Continue current medication regimen.  2. Mixed hyperlipidemia      Lab Results   Component Value Date    CHOL 224 (H) 11/30/2022    CHOL 201 (H) 11/12/2020    TRIG 109 11/30/2022    TRIG 86 11/12/2020    HDL 66 11/30/2022    HDL 54 11/12/2020    LDL 136 (H) 11/30/2022    LDL 130 (H) 11/12/2020     Prior LDL above goal.  Continue atorvastatin along with the lifestyle modifications and follow-up with PCP in 3 months.    Return in about 2 weeks (around 01/19/2023).    Time spent  on the visit : 30 minutes    Josue Hector, MD

## 2023-01-07 LAB — VITAMIN A: Vitamin A: 34 ug/dL — ABNORMAL LOW (ref 38–98)

## 2023-01-08 ENCOUNTER — Encounter (INDEPENDENT_AMBULATORY_CARE_PROVIDER_SITE_OTHER): Payer: Self-pay | Admitting: Specialist

## 2023-01-10 LAB — VITAMIN B1, WHOLE BLOOD: Whole Blood Vitamin B1: 89 nmol/L (ref 78–185)

## 2023-01-16 ENCOUNTER — Encounter (INDEPENDENT_AMBULATORY_CARE_PROVIDER_SITE_OTHER): Payer: Self-pay

## 2023-01-18 ENCOUNTER — Encounter (HOSPITAL_BASED_OUTPATIENT_CLINIC_OR_DEPARTMENT_OTHER): Payer: Self-pay | Admitting: Family

## 2023-01-24 ENCOUNTER — Telehealth (INDEPENDENT_AMBULATORY_CARE_PROVIDER_SITE_OTHER): Payer: Self-pay

## 2023-01-25 ENCOUNTER — Encounter (INDEPENDENT_AMBULATORY_CARE_PROVIDER_SITE_OTHER): Payer: Self-pay

## 2023-01-29 ENCOUNTER — Encounter (INDEPENDENT_AMBULATORY_CARE_PROVIDER_SITE_OTHER): Payer: Self-pay

## 2023-02-05 ENCOUNTER — Encounter (INDEPENDENT_AMBULATORY_CARE_PROVIDER_SITE_OTHER): Payer: Self-pay | Admitting: Internal Medicine

## 2023-02-05 ENCOUNTER — Other Ambulatory Visit (FREE_STANDING_LABORATORY_FACILITY): Payer: Medicare Other

## 2023-02-05 DIAGNOSIS — E782 Mixed hyperlipidemia: Secondary | ICD-10-CM

## 2023-02-05 DIAGNOSIS — E559 Vitamin D deficiency, unspecified: Secondary | ICD-10-CM

## 2023-02-05 DIAGNOSIS — E519 Thiamine deficiency, unspecified: Secondary | ICD-10-CM

## 2023-02-05 DIAGNOSIS — Z9884 Bariatric surgery status: Secondary | ICD-10-CM

## 2023-02-05 LAB — COMPREHENSIVE METABOLIC PANEL
ALT: 11 U/L (ref 0–55)
AST (SGOT): 16 U/L (ref 5–41)
Albumin/Globulin Ratio: 1.3 (ref 0.9–2.2)
Albumin: 3.6 g/dL (ref 3.5–5.0)
Alkaline Phosphatase: 80 U/L (ref 37–117)
Anion Gap: 5 (ref 5.0–15.0)
BUN: 12 mg/dL (ref 7.0–21.0)
Bilirubin, Total: 0.5 mg/dL (ref 0.2–1.2)
CO2: 29 mEq/L (ref 17–29)
Calcium: 9.5 mg/dL (ref 7.9–10.2)
Chloride: 106 mEq/L (ref 99–111)
Creatinine: 0.8 mg/dL (ref 0.4–1.0)
Globulin: 2.7 g/dL (ref 2.0–3.6)
Glucose: 86 mg/dL (ref 70–100)
Potassium: 4.5 mEq/L (ref 3.5–5.3)
Protein, Total: 6.3 g/dL (ref 6.0–8.3)
Sodium: 140 mEq/L (ref 135–145)
eGFR: >60 mL/min/{1.73_m2} (ref >=60)

## 2023-02-05 LAB — LIPID PANEL
Cholesterol / HDL Ratio: 3 Index
Cholesterol: 216 mg/dL — ABNORMAL HIGH (ref 0–199)
HDL: 72 mg/dL (ref 40–9999)
LDL Calculated: 122 mg/dL — ABNORMAL HIGH (ref 0–99)
Triglycerides: 108 mg/dL (ref 34–149)
VLDL Calculated: 22 mg/dL (ref 10–40)

## 2023-02-05 LAB — HEMOLYSIS INDEX(SOFT): Hemolysis Index: 8 Index (ref 0–24)

## 2023-02-05 LAB — VITAMIN D, 25 OH, TOTAL: Vitamin D, 25 OH, Total: 23 ng/mL — ABNORMAL LOW (ref 30–100)

## 2023-02-06 ENCOUNTER — Encounter (INDEPENDENT_AMBULATORY_CARE_PROVIDER_SITE_OTHER): Payer: Self-pay

## 2023-02-07 ENCOUNTER — Encounter (INDEPENDENT_AMBULATORY_CARE_PROVIDER_SITE_OTHER): Payer: Self-pay

## 2023-02-07 ENCOUNTER — Telehealth (INDEPENDENT_AMBULATORY_CARE_PROVIDER_SITE_OTHER): Payer: Self-pay

## 2023-02-07 NOTE — Progress Notes (Signed)
Class conducted via V[employer]yo[employer] app.     Nutrition 101    S & O: Pt attended and actively participated in group class.  Pt is interested in non-surgical weight loss to improve health.    Previous Wt:   Wt Readings from Last 10 Encounters:   01/04/23 215 lb   01/01/23 214 lb   11/24/22 217 lb   02/16/22 203 lb   01/24/22 198 lb 1.6 oz   01/16/22 199 lb   10/12/21 200 lb   08/16/21 202 lb   06/10/21 201 lb   04/13/21 201 lb       Nutrition Assessment and Diagnosis: Pt with the condition of overweight or obesity (There is no height or weight on file to calculate BMI.) and co-morbidities with ongoing nutrition knowledge deficit as evidenced by initial report;  active participation.  Patient continues to demonstrate motivation to lose weight and change behaviors. Patient has mild nutrition knowledge deficit as evidenced per initial report and continued elevated BMI. Patient was educated during the group session on calories, nutrient density and how to pair appropriate foods together for meals.  Discussion focused on:     1. Behavior Modification for weight loss  2. Understanding differences between macro-nutrients: protein, fat, and carbohydrates.   3. Designing a nutritious, balanced meal at home and at restaurants. Meal and snack ideas provided.  3. Learning how to accurately measure portion sizes and track food intake.   4. How to achieve calorie deficit to result in weight loss.   5. Healthier substitutions for sugar-sweetened beverages.   6. The importance of meal timing, proper sleep, adequate hydration, and stress management  7. How 1:1 nutrition counseling can further support weight loss and maintenance efforts.  8. Personalized vs self-guided wellness programs our practice offers for support.     Materials Provided:  Copy of powerpoint slides.     P.   Pt will start goal setting and make small but measureable improvement in meal pattern, food choices, and lifestyle habits.      Educated pt for 60 minutes in a  group setting.  Plan reviewed with DO/NP.

## 2023-02-09 LAB — WHOLE BLOOD VITAMIN B1 (THIAMINE): Vitamin B1, Whole Blood: 81 nmol/L (ref 78–185)

## 2023-02-10 ENCOUNTER — Ambulatory Visit (INDEPENDENT_AMBULATORY_CARE_PROVIDER_SITE_OTHER): Payer: Medicare Other | Admitting: Family Nurse Practitioner

## 2023-02-10 ENCOUNTER — Encounter (INDEPENDENT_AMBULATORY_CARE_PROVIDER_SITE_OTHER): Payer: Self-pay

## 2023-02-10 VITALS — BP 103/64 | HR 91 | Temp 102.1°F | Resp 16 | Ht 69.0 in | Wt 216.0 lb

## 2023-02-10 DIAGNOSIS — R5381 Other malaise: Secondary | ICD-10-CM

## 2023-02-10 DIAGNOSIS — R509 Fever, unspecified: Secondary | ICD-10-CM

## 2023-02-10 DIAGNOSIS — J111 Influenza due to unidentified influenza virus with other respiratory manifestations: Secondary | ICD-10-CM

## 2023-02-10 DIAGNOSIS — R052 Subacute cough: Secondary | ICD-10-CM

## 2023-02-10 LAB — POC COVID QUICKVUE ANTIGEN: QuickVue SARS COV2 Antigen POCT: NEGATIVE

## 2023-02-10 LAB — POCT INFLUENZA A/B
POCT Rapid Influenza A AG: POSITIVE — AB
POCT Rapid Influenza B AG: NEGATIVE

## 2023-02-10 MED ORDER — OSELTAMIVIR PHOSPHATE 75 MG PO CAPS
75.0000 mg | ORAL_CAPSULE | Freq: Two times a day (BID) | ORAL | 0 refills | Status: AC
Start: 2023-02-10 — End: 2023-02-15

## 2023-02-10 MED ORDER — BENZONATATE 100 MG PO CAPS
100.0000 mg | ORAL_CAPSULE | Freq: Three times a day (TID) | ORAL | 0 refills | Status: AC | PRN
Start: 2023-02-10 — End: 2023-03-12

## 2023-02-10 MED ORDER — ACETAMINOPHEN 325 MG PO TABS
650.0000 mg | ORAL_TABLET | Freq: Once | ORAL | Status: AC
Start: 2023-02-10 — End: 2023-02-10
  Administered 2023-02-10: 650 mg via ORAL

## 2023-02-10 NOTE — Progress Notes (Signed)
[facility]  PROGRESS NOTE     Patient: Barbara Rubio   Date: 02/10/2023   MRN: [id number]   Barbara Rubio is a 72 y.o. female      HISTORY     History obtained from: Patient    Chief Complaint   Patient presents with    Cough     Chills    Sinus Problem     Body ache.     Fever     2 days. Took Teraflu at 4 :00 Am          Cough  Associated symptoms include a fever.   Sinus Problem  Associated symptoms include coughing.   Fever   Associated symptoms include coughing.    Barbara Rubio presents complaining of flu-like symptoms: fevers, chills, myalgias, congestion, and cough for 2 days. Denies dyspnea or wheezing. Patient stated she was around her granddaughter who was dx with flu a few days ago. Patient denies nausea and vomiting.       Review of Systems   Constitutional:  Positive for fever.   Respiratory:  Positive for cough.        History:    Pertinent Past Medical, Surgical, Family and Social History were reviewed.        Current Outpatient Medications:     atorvastatin (LIPITOR) 10 MG tablet, Take 1 tablet (10 mg) by mouth every evening TAKE 1 TABLET BY MOUTH EVERY DAY AT NIGHT, Disp: 90 tablet, Rfl: 3    estradiol (ESTRACE) 0.1 MG/GM vaginal cream, PLACE 1 G VAGINALLY TWICE A WEEK TWICE A WEEK - USES DOSE MONDAYS AND WEDNESDAYS, Disp: 42.5 g, Rfl: 0    lisinopril (ZESTRIL) 10 MG tablet, Take 1 tablet (10 mg) by mouth daily, Disp: 30 tablet, Rfl: 5    thiamine (B-1) 100 MG tablet, Take 1 tablet (100 mg) by mouth daily, Disp: 90 tablet, Rfl: 0    vitamin D, ergocalciferol, (DRISDOL) 50000 UNIT Cap, Take 1 capsule (50,000 Units) by mouth once a week, Disp: 8 capsule, Rfl: 0    benzonatate (TESSALON) 100 MG capsule, Take 1 capsule (100 mg) by mouth 3 (three) times daily as needed for Cough, Disp: 30 capsule, Rfl: 0    Multiple Vitamin (multivitamin) capsule, Take 1 capsule by mouth daily (Patient not taking: Reported on 02/10/2023), Disp: , Rfl:     oseltamivir (TAMIFLU) 75 MG  capsule, Take 1 capsule (75 mg) by mouth 2 (two) times daily for 5 days, Disp: 10 capsule, Rfl: 0    zinc gluconate 50 MG tablet, Take 1 tablet (50 mg) by mouth daily (Patient not taking: Reported on 02/10/2023), Disp: , Rfl:   No current facility-administered medications for this visit.    No Known Allergies    Medications and Allergies reviewed.    PHYSICAL EXAM     Vitals:    02/10/23 0929   BP: 103/64   BP Site: Left arm   Patient Position: Sitting   Cuff Size: Large   Pulse: 91   Resp: 16   Temp: (!) 102.1 F (38.9 C)   SpO2: 94%   Weight: 98 kg (216 lb)   Height: 1.753 m (5\' 9" )       Physical Exam  Vitals and nursing note reviewed.   Constitutional:       General: She is awake.      Appearance: She is ill-appearing.   HENT:      Head: Normocephalic.  Nose: Congestion present.   Pulmonary:      Effort: Pulmonary effort is normal.      Breath sounds: Normal breath sounds and air entry. No wheezing.   Neurological:      Mental Status: She is alert.   Psychiatric:         Behavior: Behavior is cooperative.         PHYSICAL EXAM:   GEN: Appears weak and lethargic.  EARS: Bilateral TM's and external ear canals normal  Oropharynx: Mucous membranes moist, pharynx normal without lesions  NECK: Supple, no significant adenopathy  LUNGS: Clear to auscultation, no wheezes, rales or rhonchi  HEART: Regular rate and rhythm, no murmurs noted    UCC COURSE     LABS  The following POCT tests were ordered, reviewed and discussed with the patient/family.     Results       Procedure Component Value Units Date/Time    POCT Influenza A/B [[id number]]  (Abnormal) Collected: 02/10/23 0952     Updated: 02/10/23 0953     POCT QC Pass     POCT Rapid Influenza A AG Positive     POCT Rapid Influenza B AG Negative    QuickVue SARS-COV-2 Antigen POCT [[id number]]  (Normal) Collected: 02/10/23 0950    Specimen: Nasal Swab COVID-19 from [facility] Updated: 02/10/23 0952     QuickVue SARS COV2 Antigen POCT Negative            There were no x-rays  reviewed with this patient during the visit.    No current facility-administered medications for this visit.          PROCEDURES     Procedures      MEDICAL DECISION MAKING     History, physical and labs/studies most consistent with Influenza.    Chart Review:  Prior PCP, Specialist and/or ED notes reviewed today: No  Prior labs/images/studies reviewed today: No    Differential Diagnosis: COVID-19, Upper Respiratory Infection, RSV, Sinusitis, Pharyngitis, Pneumonia     Barbara Rubio has the following risk factors for serious influenza complications hypertension.    ASSESSMENT     Encounter Diagnoses   Name Primary?    Subacute cough     Flu Yes    Malaise and fatigue     Fever, unspecified             PLAN      PLAN:  Treatment as per orders. Push fluids. Use OTC acetaminophen or ibuprofen for fever control. Expectant course discussed with patient. Return to clinic prn if these symptoms worsen or fail to improve as anticipated.    The patient falls within treatment timeframe and guidelines for antiviral medication based on their history and risk factors yes.      Orders Placed This Encounter   Procedures    QuickVue SARS-COV-2 Antigen POCT    POCT Influenza A/B     Requested Prescriptions     Signed Prescriptions Disp Refills    oseltamivir (TAMIFLU) 75 MG capsule 10 capsule 0     Sig: Take 1 capsule (75 mg) by mouth 2 (two) times daily for 5 days    benzonatate (TESSALON) 100 MG capsule 30 capsule 0     Sig: Take 1 capsule (100 mg) by mouth 3 (three) times daily as needed for Cough       Discussed results and diagnosis with patient/family.  Reviewed warning signs for worsening condition, as well as, indications for follow-up with primary care physician and  return to urgent care clinic.   Patient/family expressed understanding of instructions.     An After Visit Summary was given to the patient.

## 2023-02-10 NOTE — Patient Instructions (Signed)
Influenza (Flu), confirmed via diagnostic testing.      Objective:    To manage and alleviate symptoms of the flu.  To prevent the spread of the virus to others.  To monitor for any signs of complications.    Medication Management:    Antiviral Medication (if prescribed):    Medication Name: Tamaflu twice per day for 5 days.       Symptom Relief:    Fever Reducers: Acetaminophen or Ibuprofen as needed for fever and pain.  Cough Suppressants: As needed for cough relief.    Hydration and Nutrition:    Encourage fluid intake, aiming for at least 8 glasses of water per day. Include broth and electrolyte solutions as well.  Maintain a balanced diet, with an emphasis on easy-to-digest foods.    Rest and Activity:  Ensure adequate rest. Limit physical activity until symptoms improve.  Gradually resume normal activities as symptoms resolve.    Symptom Monitoring:  Monitor for fever, cough, and other flu symptoms daily.  Be vigilant for signs of complications such as difficulty breathing, chest pain, severe weakness, or high fever.    Infection Control:  Practice good hand hygiene and use face masks if interacting with others.    Environmental Controls:  Keep the living space well-ventilated.  Regularly clean and disinfect surfaces, especially those frequently touched.    Follow-Up Care:  Schedule a follow-up appointment in [Insert Number of Days] days or sooner if symptoms worsen.    Patient Education:    Educate the patient on the nature of the flu, the importance of the care plan, and how to monitor symptoms.  Provide guidance on when to seek immediate medical attention.    Support System:    Engage family members or caregivers in understanding the care plan and precautions to take.  Home care instructions:  Get plenty of rest.  Warm steam opens and moisturizes stuffy breathing passages, and helps thin the mucus so you can cough it up and get phlegm out.  Peppermint essential oil can help improve breathing, reduce  inflammation in the lungs and sinuses, and ease sinus headaches.   Menthol vapors from the peppermint oil help ease congestion in nasal passages.   Breathe the essential oil steam from a hot shower to help clear the sinuses and decongest the respiratory system.  Strict oral hygiene care:  Change toothbrush.   Brush teeth twice per day.   Use mouthwash twice per day.    Nutrition:   Drink plenty of fluids to prevent dehydration.-Gatorade or Coconut  water (has good electrolytes)  Water is necessary to thin the mucus and make it easier to cough up. Also, fighting an infection consumes energy, which increases your requirements for water. Fluids like hot tea and broth-y soups will provide the extra hydration your body needs. These hot beverages can also help soothe irritated airways.  Saltwater gargles  Gargling saltwater helps to loosen mucus so you can easily get rid of it, making way for more mucus to move up from the lungs and lower airways. It's one of the most well-known home remedies and one of the first things you might consider when thinking about how to get rid of chest congestion.  Chloraseptic, throat lozenges for symptom management.    Medications:   Cough:  Try Robitussin over the counter for cough.   Halls cough lozenges  Sinus Headache/pressure:   Sudafed Sinus congestion 12 hour.   Flonase Sensamist Daily.     Ibuprofen/Tylenol for  pain/fevers.  Vitamin C 1000 mg daily.   Vitamin C has antioxidant properties and protects the immune system against stress generated during infections.    Return/seek medical attention if worse, not improved/resolved or new  symptoms.

## 2023-02-22 ENCOUNTER — Ambulatory Visit (INDEPENDENT_AMBULATORY_CARE_PROVIDER_SITE_OTHER): Payer: Medicare Other | Admitting: Internal Medicine

## 2023-02-27 ENCOUNTER — Ambulatory Visit (INDEPENDENT_AMBULATORY_CARE_PROVIDER_SITE_OTHER): Payer: Medicare Other | Admitting: Internal Medicine

## 2023-02-28 ENCOUNTER — Encounter (INDEPENDENT_AMBULATORY_CARE_PROVIDER_SITE_OTHER): Payer: Self-pay

## 2023-03-29 ENCOUNTER — Ambulatory Visit (INDEPENDENT_AMBULATORY_CARE_PROVIDER_SITE_OTHER): Payer: Medicare Other | Admitting: Internal Medicine

## 2023-03-29 VITALS — BP 137/85 | HR 66 | Temp 97.9°F | Ht 68.0 in | Wt 218.0 lb

## 2023-03-29 DIAGNOSIS — E782 Mixed hyperlipidemia: Secondary | ICD-10-CM

## 2023-03-29 DIAGNOSIS — I10 Essential (primary) hypertension: Secondary | ICD-10-CM

## 2023-03-29 DIAGNOSIS — Z6833 Body mass index (BMI) 33.0-33.9, adult: Secondary | ICD-10-CM

## 2023-03-29 DIAGNOSIS — E6609 Other obesity due to excess calories: Secondary | ICD-10-CM

## 2023-03-29 DIAGNOSIS — Z9884 Bariatric surgery status: Secondary | ICD-10-CM

## 2023-03-29 MED ORDER — TOPIRAMATE 25 MG PO TABS
25.0000 mg | ORAL_TABLET | Freq: Two times a day (BID) | ORAL | 2 refills | Status: DC
Start: 2023-03-29 — End: 2024-01-01

## 2023-03-29 MED ORDER — VITAMIN A 3 MG (10000 UT) PO CAPS
10000.0000 [IU] | ORAL_CAPSULE | Freq: Every day | ORAL | 0 refills | Status: DC
Start: 2023-03-29 — End: 2024-08-08

## 2023-03-29 NOTE — Patient Instructions (Addendum)
Consider replacing brown sugar in oatmeal with berries or fruit.     Consider replacing juice with crystal light in water.    _______________________    Using resistance bands to exercise can be a great way to get strength training for your muscles without the expense or space of having to use dumbbells or machines. Here are some ideas to get you started!    To make progress with muscle strength, you should try to hit each muscle group 2-3 times per week. Even a little will really help!    20 min full body workout  This one uses a slightly different type of resistance band available here:  Long resistance bands- amazon    25 min Mini resistance band workout: (only one band needed)    10 min Resistance band exercises for Seniors- upper body and core    15 min beginner exercises with resistance band    There are many inexpensive brands for these but this is an example of a loop type band:  resistance bands on amazon      ________________________    The Topiramate is a type of medication that decreases mental appetite. This hopefully helps you to think/worry about food less, and have more control. Most people feel more "satisfied" by the food they eat.     Take topiramate once a day in the morning initially. After a week if you are having no issues but feel that you need more help later in the day, you can add a second tablet in the late afternoon or early evening.      It can sometimes cause numbness or tingling in the fingers. You should drink plenty of water on this medication to prevent kidney stone formation, which is more common in people who take it. As with many medications, you should monitor closely for any changes in your mood (feeling more down/depressed, anxious, etc), and if you notice any problems with this you should stop the medication and let your doctor know right away. Topiramate has some mood stabilization properties, so usually it does not cause ups or downs, but it works in the brain and  everyone will respond differently to this. If you feel unsafe, you should seek emergency care right away by calling 911.     It is imperative that you avoid pregnancy while on this medication, as it can cause birth defects. You should use a reliable method of birth control, like birth control pills or an IUD if possible. If that is not a good option for you after talking to your doctor, you should be consistent with using a condom every time. If you have any reason to believe you are pregnant, stop the medication immediately.   High doses can interfere with birth control, but based on the studies that have been done we don't really worry about this unless your topiramate dose is 200 mg per day or above.     https://api.meducation.com/[email or web address]/fdbpem/6019      _______________________________    Bariatric vitamin brands: Bariatric Advantage, [employer]

## 2023-03-29 NOTE — Progress Notes (Signed)
Progress Note- [place] [facility] follow up visit      Patient Name: Barbara Rubio, Barbara Rubio  Age: 72 y.o.  Sex: female   DOB: Apr 14, 1951  MRN: [id number]    PMH:   HTN  HLD  GERD  Psoriatic arthritis  S/p sleeve gastrectomy 2021, preop wt 235 lb, postop nadir 175 lb (reported)     Chief Complaint   Patient presents with    Follow-up    Obesity      Weight: 218 lb  Initial Weight: 215 lb    Total Weight Loss: 3 pounds      AOM history/contraindications:  Phentermine due to HTN hx    AOM currently taking: None      Interim hx:   AntiHTN medications increased due to uncontrolled HTN - now controlled.    Working on diet. Feels that snacks are the biggest issues.   Sweet cravings.     B- oatmeal (1 tbsp brown sugar) + 1 [place] bacon   L- salad (cabbage + kale + fruit) with grilled chicken (light cream cheese dressing)  D- protein   Snacks/desserts- oatmeal cookies, other cookies   Drinks juice    Walking at work (3000-5000 steps per day).     Physical Exam:  Constitutional: NAD, Looks well, able to respond appropriately to questions  HEENT: NCAT, anicteric sclera  Resp: able to complete full sentences, respirations unlabored  Neuro: alert and oriented, normal gait   Psych: appropriate judgement, mood, and thought content     Relevant labs 3-01/2023 reviewed by me.    [name] was seen today for follow-up and obesity.    Diagnoses and all orders for this visit:    Class 1 obesity due to excess calories with serious comorbidity and body mass index (BMI) of 33.0 to 33.9 in adult  -     vitamin A 3 MG (10000 UT) capsule; Take 1 capsule (10,000 Units) by mouth daily  -     topiramate (TOPAMAX) 25 MG tablet; Take 1 tablet (25 mg) by mouth 2 (two) times daily    Mixed hyperlipidemia    Essential hypertension    S/P laparoscopic sleeve gastrectomy  -     vitamin A 3 MG (10000 UT) capsule; Take 1 capsule (10,000 Units) by mouth daily        Plan:     Obesity -   Discussed nutrition/exercise goals.   Discussed that insurance would  not cover injectable medications and pt states that out of pocket is cost-prohibitive.   Trial topiramate for sweet cravings.    HTN -   Controlled with current regimen, continue    HLD -   On statin     Vitamin A deficiency -   Supplement ordered  Pt to start bariatric MVI     Vitamin D deficiency -  Pt to take 5000 IU daily   Pt to start bariatric MVI       Patient verbalized understanding and agrees with the plan.    Return for 3 months.    Patient instructions:  Patient Instructions     Consider replacing brown sugar in oatmeal with berries or fruit.     Consider replacing juice with crystal light in water.    _______________________    Using resistance bands to exercise can be a great way to get strength training for your muscles without the expense or space of having to use dumbbells or machines. Here are some ideas to get you started!  To make progress with muscle strength, you should try to hit each muscle group 2-3 times per week. Even a little will really help!    20 min full body workout  This one uses a slightly different type of resistance band available here:  Long resistance bands- amazon    25 min Mini resistance band workout: (only one band needed)    10 min Resistance band exercises for Seniors- upper body and core    15 min beginner exercises with resistance band    There are many inexpensive brands for these but this is an example of a loop type band:  resistance bands on amazon      ________________________    The Topiramate is a type of medication that decreases mental appetite. This hopefully helps you to think/worry about food less, and have more control. Most people feel more "satisfied" by the food they eat.     Take topiramate once a day in the morning initially. After a week if you are having no issues but feel that you need more help later in the day, you can add a second tablet in the late afternoon or early evening.      It can sometimes cause numbness or tingling in the fingers. You  should drink plenty of water on this medication to prevent kidney stone formation, which is more common in people who take it. As with many medications, you should monitor closely for any changes in your mood (feeling more down/depressed, anxious, etc), and if you notice any problems with this you should stop the medication and let your doctor know right away. Topiramate has some mood stabilization properties, so usually it does not cause ups or downs, but it works in the brain and everyone will respond differently to this. If you feel unsafe, you should seek emergency care right away by calling 911.     It is imperative that you avoid pregnancy while on this medication, as it can cause birth defects. You should use a reliable method of birth control, like birth control pills or an IUD if possible. If that is not a good option for you after talking to your doctor, you should be consistent with using a condom every time. If you have any reason to believe you are pregnant, stop the medication immediately.   High doses can interfere with birth control, but based on the studies that have been done we don't really worry about this unless your topiramate dose is 200 mg per day or above.     https://api.meducation.com/[email or web address]/fdbpem/6019      _______________________________    Bariatric vitamin brands: Bariatric Advantage, [employer]     Jeneen Montgomery, MD

## 2023-03-31 ENCOUNTER — Encounter (INDEPENDENT_AMBULATORY_CARE_PROVIDER_SITE_OTHER): Payer: Self-pay

## 2023-04-02 ENCOUNTER — Ambulatory Visit (INDEPENDENT_AMBULATORY_CARE_PROVIDER_SITE_OTHER): Payer: Medicare Other | Admitting: Family

## 2023-04-02 ENCOUNTER — Encounter (INDEPENDENT_AMBULATORY_CARE_PROVIDER_SITE_OTHER): Payer: Self-pay | Admitting: Family

## 2023-04-02 VITALS — BP 130/82 | HR 65 | Temp 97.1°F | Wt 215.6 lb

## 2023-04-02 DIAGNOSIS — E559 Vitamin D deficiency, unspecified: Secondary | ICD-10-CM

## 2023-04-02 DIAGNOSIS — I10 Essential (primary) hypertension: Secondary | ICD-10-CM

## 2023-04-02 DIAGNOSIS — E782 Mixed hyperlipidemia: Secondary | ICD-10-CM

## 2023-04-02 DIAGNOSIS — Z1231 Encounter for screening mammogram for malignant neoplasm of breast: Secondary | ICD-10-CM

## 2023-04-02 DIAGNOSIS — Z0000 Encounter for general adult medical examination without abnormal findings: Secondary | ICD-10-CM

## 2023-04-02 DIAGNOSIS — H9190 Unspecified hearing loss, unspecified ear: Secondary | ICD-10-CM

## 2023-04-02 MED ORDER — ESTRADIOL 0.1 MG/GM VA CREA
1.0000 g | TOPICAL_CREAM | VAGINAL | 0 refills | Status: DC
Start: 2023-04-02 — End: 2023-11-25

## 2023-04-02 MED ORDER — VITAMIN D (ERGOCALCIFEROL) 1.25 MG (50000 UT) PO CAPS
50000.0000 [IU] | ORAL_CAPSULE | ORAL | 0 refills | Status: DC
Start: 2023-04-02 — End: 2023-11-25

## 2023-04-02 MED ORDER — ATORVASTATIN CALCIUM 20 MG PO TABS
20.0000 mg | ORAL_TABLET | Freq: Every day | ORAL | 1 refills | Status: DC
Start: 2023-04-02 — End: 2024-01-02

## 2023-04-02 NOTE — Progress Notes (Signed)
[facility] [address]    Barbara Rubio is a 72 y.o. female who presents today for the following Medicare Wellness Visit:  []  Initial Preventive Physical Exam (IPPE) - "Welcome to Medicare" preventive visit (Vision Screening required)   []  Annual Wellness Visit - Initial  [x]  Annual Wellness Visit - Subsequent       Health Risk Assessment:   During the past month, how would you rate your general health?:  Very Good  Which of the following tasks can you do without assistance - drive or take the bus alone; shop for groceries or clothes; prepare your own meals; do your own housework/laundry; handle your own finances/pay bills; eat, bathe or get around your home?: Do your own housework/laundry, Drive or take the bus alone, Shop for groceries or clothes, Handle your own finances/pay bills, Eat, bathe, dress or get around your home, Prepare your own meals  Which of the following problems have you been bothered by in the past month - dizzy when standing up; problems using the phone; feeling tired or fatigued; moderate or severe body pain?: None of these  Do you exercise for about 20 minutes 3 or more days per week?:Yes  During the past month was someone available to help if you needed and wanted help?  For example, if you felt nervous, lonely, got sick and had to stay in bed, needed someone to talk to, needed help with daily chores or needed help just taking care of yourself.: Yes  Do you always wear a seat belt?: Yes  Do you have any trouble taking medications the way you have been told to take them?: No  Have you been given any information that can help you with keeping track of your medications?: Yes  Do you have trouble paying for your medications?: No  Have you been given any information that can help you with hazards in your house, such as scatter rugs, furniture, etc?: Yes  Do you feel unsteady when standing or walking?: No  Do you worry about falling?: No  Have you fallen two or  more times in the past year?: No  Did you suffer any injuries from your falls in the past year?: No     Care Team:   Patient Care Team:  Morrison Old, MD as PCP - General (Internal Medicine)  Nicola Police, DO as Consulting Physician (Surgery)      Hospitalizations:   Hospitalization within past year: [x]  No  []  Yes     Diagnosis:      Screenings:       02/17/2023 03/31/2023 04/02/2023   Ambulatory Screenings   Falls Risk: [place] more than 2 times in past year N N N   Falls Risk: Suffer any injuries? N N N   Depression: PHQ2 Total Score   0   Depression: PHQ9 Total Score   0           Substance Use Disorder Screen:  In the past year, how often have you used the following?  1) Alcohol (For men, 5 or more drinks a day. For women, 4 or more drinks a day)  [x]  Never []  Once or Twice []  Monthly []  Weekly []  Daily or Almost Daily  2) Tobacco Products  [x]  Never []  Once or Twice []  Monthly []  Weekly []  Daily or Almost Daily  3) Prescription Drugs for Non-Medical Reasons  [x]  Never []  Once or Twice []  Monthly []  Weekly []  Daily or Almost Daily  4) Illegal Drugs  [x]  Never []  Once or Twice []  Monthly []  Weekly []  Daily or Almost Daily             Functional Ability/Level of Safety:   Falls Risk/Home Safety Assessment:  ( see HRA and Screenings sections for additional assessment)  Home Safety: [x]  Stair handrails  [x]  Skid-resistant rugs/remove throw rugs   []  Grab bars  [x]  Clear pathways between rooms  [x]  Proper lighting stairs/ bathrooms/bedrooms  Get Up and Go (optional):  []   <20 secs  []   >20 secs    []   High risk for falls - Home Safety/Falls Risk Precautions reviewed with pt/family    Hearing Assessment:  Concerns for hearing loss: [x]  Yes  []   No  Hearing aids:   []   Right  []   Left  []   Bilateral   []   None  Whisper Test (optional):  []  Normal  []   Slightly decreased  []   Significantly decreased    Exercise:  Frequency:  []   No formal exercise  []   1-2x/wk  []   3-4x/wk  [x]   >4x/wk  Duration:  []   15-30 mins/day   []   30-45 mins/day  [x]   45+ mins/day  Intensity:  []   Light  [x]   Moderate  []   Heavy        Activities of Daily Living:   ADL's Independent Minimal  Assistance Moderate  Assistance Total   Assistance   Bathing [x]  []  []  []    Dressing [x]  []  []  []    Mobility   [x]  []  []  []    Transfer [x]  []  []  []    Eating [x]  []  []  []    Toileting [x]  []  []  []      IADL's Independent Minimal  Assistance Moderate  Assistance Total   Assistance   Phone [x]  []  []  []    Housekeeping [x]  []  []  []    Laundry [x]  []  []  []    Transportation [x]  []  []  []    Medications [x]  []  []  []    Finances [x]  []  []  []       ADL assistance: [x]  No assistance needed  []  Spouse  []  Sibling  []  Son   []  Daughter []  Children  []  Home Health Aide []  Other:       Advance Care Planning:   Discussion of Advance Directives:   [x]  Advance Directive in chart  []  Advance Directive not in chart - requested to provide []  No Advance Directive.  Form Provided  []  No Advance Directive.  Pt declines. []  Not addressed today  []  Other:     Exam:   BP 130/82 (BP Site: Left arm, Patient Position: Sitting, Cuff Size: Large)   Pulse 65   Temp 97.1 F (36.2 C) (Temporal)   Wt 97.8 kg (215 lb 9.6 oz)   SpO2 98%   BMI 32.78 kg/m      Physical Exam  Vitals reviewed.   Constitutional:       General: She is not in acute distress.     Appearance: Normal appearance. She is not ill-appearing.   HENT:      Head: Normocephalic and atraumatic.      Right Ear: Tympanic membrane, ear canal and external ear normal.      Left Ear: Tympanic membrane, ear canal and external ear normal.      Mouth/Throat:      Mouth: Mucous membranes are moist.      Pharynx: Oropharynx is clear.   Eyes:      Conjunctiva/sclera:  Conjunctivae normal.   Cardiovascular:      Rate and Rhythm: Normal rate and regular rhythm.      Pulses: Normal pulses.      Heart sounds: Normal heart sounds.   Pulmonary:      Effort: Pulmonary effort is normal.      Breath sounds: Normal breath sounds.   Abdominal:      General: Bowel  sounds are normal. There is no distension.      Palpations: Abdomen is soft.      Tenderness: There is no abdominal tenderness. There is no guarding.   Musculoskeletal:         General: Normal range of motion.      Cervical back: Normal range of motion and neck supple.   Skin:     General: Skin is warm and dry.      Capillary Refill: Capillary refill takes less than 2 seconds.   Neurological:      General: No focal deficit present.      Mental Status: She is alert and oriented to person, place, and time.   Psychiatric:         Mood and Affect: Mood normal.         Thought Content: Thought content normal.               Evaluation of Cognitive Function:   Mood/affect: [x]  Appropriate  []   Other:   Appearance: [x]  Neatly groomed  [x]  Adequately nourished  []  Other:  Family member/caregiver input: []  Present - no concerns  []   Not present in room  []  Present - concerns:    Cognitive Assessment:  Mini-Cog Result (three word registration- banana, sunrise, chair / clock drawing):   [x]   > 3 points - negative screen for dementia   []  3 recalled words - negative screen for dementia   []  1-2 recalled words and normal clock draw - negative for cognitive impairment   []  1-2 recalled words and abnormal clock draw - positive for cognitive impairment   []  0 recalled words - positive for cognitive impairment         Assessment/Plan:   1. Encounter for subsequent annual wellness visit in Medicare patient    2. Essential hypertension    BP Readings from Last 3 Encounters:   04/02/23 130/82   03/29/23 137/85   02/10/23 103/64     Potassium   Date Value Ref Range Status   02/05/2023 4.5 3.5 - 5.3 mEq/L Final   11/30/2022 4.8 3.5 - 5.3 mEq/L Final   01/17/2022 4.5 3.5 - 5.3 mEq/L Final     Renal Function Trend:  Lab Results   Component Value Date    CREAT 0.8 02/05/2023    CREAT 0.8 11/30/2022    CREAT 0.8 01/17/2022    CREAT 0.8 05/27/2021    CREAT 0.8 11/12/2020       Lab Results   Component Value Date    EGFR >60.0 02/05/2023    EGFR  >60.0 11/30/2022    EGFR >60.0 01/17/2022    EGFR >60.0 05/27/2021    EGFR >60.0 11/12/2020         Current blood pressure is adequately controlled on the current medication regimen. Current blood pressure is at age appropriate goal (goal SBP<150;DBP<90). Continue to optimize low salt diet and aerobic exercise efforts. and Continue current medication regimen. Recommend optimizing therapeutic lifestyle changes which include obtaining at least 150 minutes of aerobic exercise per week and eating a heart  healthy diet ( i.e. DASH diet - www.heart.org or PopSteam.is ). Recommend checking ambulatory blood pressures once or twice a day and document in a log.  Bring the log and blood pressure cuff into the office at the next follow-up visit     3. Mixed hyperlipidemia  - atorvastatin (LIPITOR) 20 MG tablet; Take 1 tablet (20 mg) by mouth daily  Dispense: 90 tablet; Refill: 1  Increase atorvastatin to 20 mg. Recheck in 3 months  Dyslipidemia:  Lab Results   Component Value Date    CHOL 216 (H) 02/05/2023    CHOL 224 (H) 11/30/2022    TRIG 108 02/05/2023    TRIG 109 11/30/2022    HDL 72 02/05/2023    HDL 66 11/30/2022    LDL 122 (H) 02/05/2023    LDL 136 (H) 11/30/2022     Recommend patient consume a healthy diet that emphasizes the intake of vegetables, fruits, nuts, whole grains, lean vegetable or animal protein, and fish and minimizes the intake of trans fats, red meat and process meats, refined carbohydrates, and sugar sweetened beverages.  Recommend patient engage in at least 150 minutes per week of accumulated moderate-intensity physical activity or 75 minutes per week of vigorous-intensity physical activity.    4. Decreased hearing, unspecified laterality  - Ambulatory referral to Audiology; Future    5. Vitamin D deficiency  - vitamin D, ergocalciferol, (DRISDOL) 50000 UNIT Cap; Take 1 capsule (50,000 Units) by mouth once a week  Dispense: 8 capsule; Refill: 0  Complete another 8 weeks of vitamin D 50,000 IU  weekly then transition to 5,000 IU daily    6. Screening mammogram, encounter for  - Mammo Digital 2D Screening Bilateral W CAD; Future           Stephanie Acre Ebrima Ranta, NP    04/02/2023     The following sections were reviewed this encounter by the provider:   Tobacco  Allergies  Meds  Problems  Med Hx  Surg Hx  Fam Hx          History:   Patient Active Problem List   Diagnosis    Essential hypertension    Mixed hyperlipidemia    Gastroesophageal reflux disease    Hiatal hernia    Vitamin D deficiency    S/P laparoscopic sleeve gastrectomy    Obesity (BMI 30-39.9)    LUQ abdominal pain    Chronic constipation      Past Medical History:   Diagnosis Date    Abnormal vision     wears glasses    Constipation     HX - HAS BEEN MORE PREVELANT POST BARIATRIC SURGERY.    COVID-19 10/2021    HX - 21//23 URGENT CARE NOTE IN EPIC.    Fibroids     PT HAD A PARTIAL HYSTERECTOMY - OVARIES ARE STILL IN PLACE.    Gastroesophageal reflux disease 10/23/2013    HX.    Hyperlipidemia     MANAGED WITH MEDS.    Hypertension     PT STATES HE BP RUNS FROM 128-130/70s. MANAGED WITH MEDS - FOLLOWED BY PCP.    Left wrist fracture 2018    HX    LUQ pain     HX    Psoriatic arthritis     HX - LOWER BACK, LEFT HAND, WRIST AND FINGERS    Urinary incontinence 12/21/2017    HX OVERACTIVE BLADDER. ISSUE RESOLVED POST BARIATRIC SURGERY - PT STATED SHE LOST 65 LBS.  Past Surgical History:   Procedure Laterality Date    BUNIONECTOMY Left     COLONOSCOPY, DIAGNOSTIC (SCREENING)      X 2     COLONOSCOPY, DIAGNOSTIC (SCREENING) N/A 04/13/2021    Procedure: COLONOSCOPY;  Surgeon: Franki Monte, MD;  Location: [facility];  Service: Gastroenterology;  Laterality: N/A;    EGD N/A 05/18/2020    Procedure: EGD w/ bx's;  Surgeon: Sabas Sous, MD;  Location: [facility];  Service: Gastroenterology;  Laterality: N/A;    EGD  12/2020    EGD N/A 01/24/2022    Procedure: EGD w/ Bx's;  Surgeon: Sabas Sous, MD;  Location: [facility];  Service: Gastroenterology;  Laterality: N/A;    HYSTERECTOMY      PARTIAL - OVARIES IN PLACE.    LAPAROSCOPIC GASTRECTOMY SLEEVE WITH HIATAL HERNIA N/A 07/28/2020    Procedure: LAPAROSCOPIC GASTRECTOMY SLEEVE WITH HIATAL HERNIA, OMENTOPEXY, LAPAROSCOPIC RESECTION OF ANTRAL AND FUNDIC GASTRIC MASS;  Surgeon: Nicola Police, DO;  Location: [facility];  Service: General;  Laterality: N/A;     No Known Allergies   Outpatient Medications Marked as Taking for the 04/02/23 encounter (Office Visit) with Wisdom Rickey, Stephanie Acre, NP   Medication Sig Dispense Refill    lisinopril (ZESTRIL) 10 MG tablet Take 1 tablet (10 mg) by mouth daily 30 tablet 5    Multiple Vitamin (multivitamin) capsule Take 1 capsule by mouth daily      topiramate (TOPAMAX) 25 MG tablet Take 1 tablet (25 mg) by mouth 2 (two) times daily 60 tablet 2    vitamin A 3 MG (10000 UT) capsule Take 1 capsule (10,000 Units) by mouth daily 20 capsule 0    vitamin D (CHOLECALCIFEROL) 25 MCG (1000 UT) tablet Take 5 tablets (5,000 Units) by mouth once a week      [DISCONTINUED] atorvastatin (LIPITOR) 10 MG tablet Take 1 tablet (10 mg) by mouth every evening TAKE 1 TABLET BY MOUTH EVERY DAY AT NIGHT 90 tablet 3    [DISCONTINUED] estradiol (ESTRACE) 0.1 MG/GM vaginal cream PLACE 1 G VAGINALLY TWICE A WEEK TWICE A WEEK - USES DOSE MONDAYS AND WEDNESDAYS 42.5 g 0     Social History     Tobacco Use    Smoking status: Never    Smokeless tobacco: Never    Tobacco comments:     Never smoked or used tobacco   Vaping Use    Vaping status: Never Used   Substance Use Topics    Alcohol use: Yes     Comment: 1-2 DRINKS MONTHLY    Drug use: Never      Family History   Problem Relation Age of Onset    Hypertension Mother     Hypertension Father     Hypertension Sister     Hypertension Brother     Migraines Neg Hx     Stroke Neg Hx     Seizures Neg Hx            ===================================================================    Additional Documentation:     Pt  clarifies she is taking vitamin D 5,000 IU daily . Pt completed 8 weeks of vitamin D 50,000 IU weekly and vitamin D check was now 23 (increased from 19)  Pt endorses she is compliant with her atorvastatin 10 mg daily    Pt endorses home BP 120s/80s   No chest pain, shortness of breath, dizziness or headaches  No evidence of CHF or proteinuria  UTD on Tdap, colonoscopy, DEXA, and PNA vaccine  Pt received first shingles vaccine and isn't due for the second one yet

## 2023-04-19 ENCOUNTER — Telehealth (INDEPENDENT_AMBULATORY_CARE_PROVIDER_SITE_OTHER): Payer: Self-pay | Admitting: Family

## 2023-04-19 NOTE — Telephone Encounter (Signed)
Please send patient's mammogram order to the following.  Please advise.    [address]  [place], [place]

## 2023-04-19 NOTE — Telephone Encounter (Signed)
Called pt and asked for Fax #   Sent order to fax # [phone]

## 2023-04-30 ENCOUNTER — Encounter (INDEPENDENT_AMBULATORY_CARE_PROVIDER_SITE_OTHER): Payer: Self-pay

## 2023-05-14 ENCOUNTER — Other Ambulatory Visit: Payer: Self-pay | Admitting: Family

## 2023-05-31 ENCOUNTER — Encounter (INDEPENDENT_AMBULATORY_CARE_PROVIDER_SITE_OTHER): Payer: Self-pay

## 2023-06-01 ENCOUNTER — Encounter (INDEPENDENT_AMBULATORY_CARE_PROVIDER_SITE_OTHER): Payer: Self-pay

## 2023-06-01 ENCOUNTER — Ambulatory Visit (INDEPENDENT_AMBULATORY_CARE_PROVIDER_SITE_OTHER): Payer: Medicare Other | Admitting: Family Nurse Practitioner

## 2023-06-01 VITALS — BP 152/80 | HR 80 | Temp 99.0°F | Resp 16 | Ht 68.0 in | Wt 215.0 lb

## 2023-06-01 DIAGNOSIS — R197 Diarrhea, unspecified: Secondary | ICD-10-CM

## 2023-06-01 DIAGNOSIS — R110 Nausea: Secondary | ICD-10-CM

## 2023-06-01 LAB — POCT INFLUENZA A/B
POCT Rapid Influenza A AG: NEGATIVE
POCT Rapid Influenza B AG: NEGATIVE

## 2023-06-01 LAB — POC COVID QUICKVUE ANTIGEN: QuickVue SARS COV2 Antigen POCT: NEGATIVE

## 2023-06-01 MED ORDER — ONDANSETRON 4 MG PO TBDP
4.0000 mg | ORAL_TABLET | Freq: Four times a day (QID) | ORAL | 0 refills | Status: AC | PRN
Start: 2023-06-01 — End: 2023-06-08

## 2023-06-01 MED ORDER — SACCHAROMYCES BOULARDII 250 MG PO CAPS
250.0000 mg | ORAL_CAPSULE | Freq: Two times a day (BID) | ORAL | 0 refills | Status: AC
Start: 2023-06-01 — End: 2023-06-11

## 2023-06-01 NOTE — Progress Notes (Signed)
Subjective:       Krysti Deist is a 72 y.o. female who presents for evaluation of sinus pain. Symptoms include: nasal congestion, sore throat, and body aches, diarrhea, no nausea or vomiting . One loose stool today. Onset of symptoms was 1 day ago. Symptoms have been unchanged since that time. Very fatigued.   Stated she had been eating out a bit over the last few days more so that they normally do.     The following portions of the patient's history were reviewed and updated as appropriate: allergies, current medications, past family history, past medical history, past social history, past surgical history, and problem list.    Review of Systems  Ears, nose, mouth, throat, and face: positive for nasal congestion  Respiratory: negative     Objective:      Nose: Nares normal. Septum midline. Mucosa normal. No drainage or sinus tenderness., mild congestion  Lungs: clear to auscultation bilaterally  Abdomen: soft, non-tender; bowel sounds normal; no masses,  no organomegaly      Assessment:      Acute viral gastritis.      Plan:      Encouraged to stay hydrated.   Zofran and Florastor to help with GI distress.    Follow back if changes to stools: continued diarrhea, blood in stools or fevers.

## 2023-06-01 NOTE — Patient Instructions (Signed)
Diarrhea                                     Diarrhea is when your stools are loose and watery. You may also need to go to the bathroom more often. Short-term (acute) diarrhea lasts 1 or 2 days. Long-term (chronic) diarrhea lasts several weeks. Diarrhea symptoms may include belly cramps and an urgent need to go to the bathroom.                              What Is The BRAT Diet?  BRAT stands for bananas, rice, applesauce, and toast. These foods are gentle on your stomach and can help firm up your stools. They're bland, low in fiber, and high in starch, making them easier to digest.                          Bananas. Not only are they easy on your stomach, but they're also rich in potassium, which helps replenish electrolytes lost during episodes of diarrhea.  Rice and toast. These simple carbs are easy to digest and provide energy without irritating your gut. It is best to opt for the white versions of both to avoid too much fiber.  Applesauce. This food option contains pectin, a type of soluble fiber that helps absorb liquid in the intestines.    Electrolyte-heavy drinks such as Gatorade or Pedialyte offer a bonus because of the amount of sodium and sugar in the drink. The sodium slows fluid loss and helps with fluid retention, while the sugar helps your body absorb electrolytes, providing a proper hydration balance.    Try probiotics  Your intestinal flora is often disrupted during bouts of diarrhea. Foods like yogurt with active cultures can reintroduce beneficial bacteria into your gut and help restore the natural balance. Also Add oral Probiotic capsule daily.

## 2023-07-02 ENCOUNTER — Telehealth (HOSPITAL_BASED_OUTPATIENT_CLINIC_OR_DEPARTMENT_OTHER): Payer: Medicare Other | Admitting: Internal Medicine

## 2023-07-20 ENCOUNTER — Other Ambulatory Visit (INDEPENDENT_AMBULATORY_CARE_PROVIDER_SITE_OTHER): Payer: Self-pay | Admitting: Internal Medicine

## 2023-07-24 ENCOUNTER — Other Ambulatory Visit (INDEPENDENT_AMBULATORY_CARE_PROVIDER_SITE_OTHER): Payer: Self-pay | Admitting: Family

## 2023-07-24 DIAGNOSIS — I10 Essential (primary) hypertension: Secondary | ICD-10-CM

## 2023-07-24 MED ORDER — LISINOPRIL 10 MG PO TABS
10.0000 mg | ORAL_TABLET | Freq: Every day | ORAL | 1 refills | Status: DC
Start: 2023-07-24 — End: 2023-11-25

## 2023-07-24 NOTE — Telephone Encounter (Signed)
LOV 04/02/2023

## 2023-07-24 NOTE — Telephone Encounter (Signed)
Name, strength, directions of requested refill(s):  lisinopril (ZESTRIL) 10 MG tablet     How much medication is remaining: NONE    Pharmacy to send refill to or patient to pick up rx from office (mark requested pharmacy in BOLD):      [id number] [place], [place] - [address]  [address]  [place] [address]  Phone: [phone] Fax: [phone]        Please mark "X" next to the preferred call back number:    Mobile: [phone] (mobile)  X    Home: [phone] (home)    Work: @WORKPHONE @        Medication refill request, see above. Thank you   Patient has been informed that medication refill requests should be called in up to one week prior to running out of medication.    Additional Notes:  pt called in requesting refill on RX none left.    Next Visit: NONE

## 2023-10-27 ENCOUNTER — Other Ambulatory Visit (INDEPENDENT_AMBULATORY_CARE_PROVIDER_SITE_OTHER): Payer: Self-pay | Admitting: Internal Medicine

## 2023-10-27 DIAGNOSIS — E782 Mixed hyperlipidemia: Secondary | ICD-10-CM

## 2023-11-24 ENCOUNTER — Other Ambulatory Visit (INDEPENDENT_AMBULATORY_CARE_PROVIDER_SITE_OTHER): Payer: Self-pay | Admitting: Family

## 2023-11-24 DIAGNOSIS — E559 Vitamin D deficiency, unspecified: Secondary | ICD-10-CM

## 2023-11-24 DIAGNOSIS — I10 Essential (primary) hypertension: Secondary | ICD-10-CM

## 2023-12-31 ENCOUNTER — Encounter (INDEPENDENT_AMBULATORY_CARE_PROVIDER_SITE_OTHER): Payer: Self-pay

## 2024-01-01 ENCOUNTER — Encounter (INDEPENDENT_AMBULATORY_CARE_PROVIDER_SITE_OTHER): Payer: Self-pay | Admitting: Family

## 2024-01-01 ENCOUNTER — Ambulatory Visit (INDEPENDENT_AMBULATORY_CARE_PROVIDER_SITE_OTHER): Payer: Medicare Other | Admitting: Family

## 2024-01-01 VITALS — BP 138/67 | HR 65 | Temp 97.5°F | Wt 221.4 lb

## 2024-01-01 DIAGNOSIS — I10 Essential (primary) hypertension: Secondary | ICD-10-CM

## 2024-01-01 DIAGNOSIS — E782 Mixed hyperlipidemia: Secondary | ICD-10-CM

## 2024-01-01 DIAGNOSIS — E559 Vitamin D deficiency, unspecified: Secondary | ICD-10-CM

## 2024-01-01 DIAGNOSIS — M791 Myalgia, unspecified site: Secondary | ICD-10-CM

## 2024-01-01 LAB — COMPREHENSIVE METABOLIC PANEL
ALT: 15 U/L (ref <=55)
AST (SGOT): 24 U/L (ref <=41)
Albumin/Globulin Ratio: 1.2 (ref 0.9–2.2)
Albumin: 3.8 g/dL (ref 3.5–5.0)
Alkaline Phosphatase: 80 U/L (ref 37–117)
Anion Gap: 7 (ref 5.0–15.0)
BUN: 11 mg/dL (ref 7–21)
Bilirubin, Total: 0.6 mg/dL (ref 0.2–1.2)
CO2: 26 meq/L (ref 17–29)
Calcium: 8.9 mg/dL (ref 7.9–10.2)
Chloride: 106 meq/L (ref 99–111)
Creatinine: 0.9 mg/dL (ref 0.4–1.0)
GFR: >60 mL/min/{1.73_m2} (ref >=60.0)
Globulin: 3.1 g/dL (ref 2.0–3.6)
Glucose: 87 mg/dL (ref 70–100)
Hemolysis Index: 8 {index}
Potassium: 4.2 meq/L (ref 3.5–5.3)
Protein, Total: 6.9 g/dL (ref 6.0–8.3)
Sodium: 139 meq/L (ref 135–145)

## 2024-01-01 LAB — LAB USE ONLY - CBC WITH DIFFERENTIAL
Absolute Basophils: 0.03 10*3/uL (ref 0.00–0.08)
Absolute Eosinophils: 0.06 10*3/uL (ref 0.00–0.44)
Absolute Immature Granulocytes: 0.01 10*3/uL (ref 0.00–0.07)
Absolute Lymphocytes: 1.5 10*3/uL (ref 0.42–3.22)
Absolute Monocytes: 0.29 10*3/uL (ref 0.21–0.85)
Absolute Neutrophils: 1.66 10*3/uL (ref 1.10–6.33)
Absolute nRBC: 0 10*3/uL (ref <=0.00)
Basophils %: 0.8 %
Eosinophils %: 1.7 %
Hematocrit: 41.8 % (ref 34.7–43.7)
Hemoglobin: 12.7 g/dL (ref 11.4–14.8)
Immature Granulocytes %: 0.3 %
Lymphocytes %: 42.3 %
MCH: 26.9 pg (ref 25.1–33.5)
MCHC: 30.4 g/dL — ABNORMAL LOW (ref 31.5–35.8)
MCV: 88.6 fL (ref 78.0–96.0)
MPV: 12.6 fL — ABNORMAL HIGH (ref 8.9–12.5)
Monocytes %: 8.2 %
Neutrophils %: 46.7 %
Platelet Count: 128 10*3/uL — ABNORMAL LOW (ref 142–346)
Preliminary Absolute Neutrophil Count: 1.66 10*3/uL (ref 1.10–6.33)
RBC: 4.72 10*6/uL (ref 3.90–5.10)
RDW: 14 % (ref 11–15)
WBC: 3.55 10*3/uL (ref 3.10–9.50)
nRBC %: 0 /100{WBCs} (ref <=0.0)

## 2024-01-01 LAB — LIPID PANEL
Cholesterol / HDL Ratio: 3.3 {index}
Cholesterol: 235 mg/dL — ABNORMAL HIGH (ref <=199)
HDL: 71 mg/dL (ref >=40)
LDL Calculated: 141 mg/dL — ABNORMAL HIGH (ref 0–99)
Triglycerides: 116 mg/dL (ref 34–149)
VLDL Calculated: 23 mg/dL (ref 10–40)

## 2024-01-01 LAB — VITAMIN D, 25 OH, TOTAL: Vitamin D 25-OH, Total: 30 ng/mL (ref 30–100)

## 2024-01-01 LAB — URINE MICROALBUMIN, RANDOM
Urine Creatinine: 268 mg/dL
Urine Microalbumin/Creatinine Ratio: 8 ug/mg (ref <30)
Urine Microalbumin: 21 ug/mL (ref 0.0–30.0)

## 2024-01-01 LAB — CREATINE KINASE (CK): Creatine Kinase (CK): 98 U/L (ref 29–233)

## 2024-01-01 MED ORDER — LISINOPRIL 10 MG PO TABS
10.0000 mg | ORAL_TABLET | Freq: Every day | ORAL | 1 refills | Status: DC
Start: 2024-01-01 — End: 2024-05-20

## 2024-01-01 NOTE — Progress Notes (Signed)
 3

## 2024-01-01 NOTE — Progress Notes (Signed)
 Have you seen any specialists since your last visit with us ?  No      The patient was informed that the following HM items are still outstanding:   influenza vaccine and shingles vaccine    Subjective:      Date: 01/01/2024 10:19 AM   Patient ID: Barbara Rubio is a 73 y.o. female.    Chief Complaint:  Chief Complaint   Patient presents with    Hypertension     Follow up     Hyperlipidemia     Follow up ( patient is fasting)        HPI:  Pt has not been checking BP at home  No chest pain, shortness of breath, dizziness or headaches  No evidence of CHF or proteinuria    Pt with ongoing HLD  Atorvastatin  increased from 10 to 20 mg in June 2024 due to LDL not being at goal  Pt with intermittent muscle leg pain. It began to flare up about 2 months ago but has occurred over the last year. Pt held atorvastatin  but didn't notice a difference . Pt denies any pain currently  Pt denies any urine color changes   Problem List:  Problem List[1]    Current Medications:  Current Medications[2]    Allergies:  Allergies[3]    Past Medical History:  Medical History[4]    Past Surgical History:  Past Surgical History[5]    Family History:  Family History[6]    Social History:  Social History[7]      The following sections were reviewed this encounter by the provider:   Tobacco  Allergies  Meds  Problems  Med Hx  Surg Hx  Fam Hx         ROS:  Review of Systems   Constitutional:  Negative for fatigue and fever.   Respiratory:  Negative for cough and shortness of breath.    Cardiovascular:  Negative for chest pain and palpitations.   Gastrointestinal:  Negative for abdominal pain.   Musculoskeletal:  Positive for myalgias.        Objective:     Vitals:  BP 138/67 (BP Site: Left arm, Patient Position: Sitting, Cuff Size: Large)   Pulse 65   Temp 97.5 F (36.4 C) (Temporal)   Wt 100.4 kg (221 lb 6.4 oz)   SpO2 98%   BMI 33.66 kg/m     Physical Exam:  Physical Exam  Vitals reviewed.   Constitutional:       Appearance:  Normal appearance.   HENT:      Head: Normocephalic and atraumatic.   Cardiovascular:      Rate and Rhythm: Normal rate and regular rhythm.   Pulmonary:      Effort: Pulmonary effort is normal.      Breath sounds: Normal breath sounds.   Musculoskeletal:      Right lower leg: No edema.      Left lower leg: No edema.   Skin:     General: Skin is warm and dry.   Neurological:      Mental Status: She is alert and oriented to person, place, and time.   Psychiatric:         Mood and Affect: Mood normal.         Assessment/Plan:       1. Essential hypertension  - CBC with Differential (Order); Future  - Comprehensive Metabolic Panel; Future  - Urine Microalbumin, Random; Future  - CBC with Differential (Order)  -  Comprehensive Metabolic Panel  - Urine Microalbumin, Random  - lisinopril  (ZESTRIL ) 10 MG tablet; Take 1 tablet (10 mg) by mouth daily  Dispense: 90 tablet; Refill: 1      BP Readings from Last 3 Encounters:   01/01/24 138/67   06/01/23 152/80   04/02/23 130/82     Potassium   Date Value Ref Range Status   02/05/2023 4.5 3.5 - 5.3 mEq/L Final   11/30/2022 4.8 3.5 - 5.3 mEq/L Final   01/17/2022 4.5 3.5 - 5.3 mEq/L Final     Renal Function Trend:  Lab Results   Component Value Date    CREAT 0.8 02/05/2023    CREAT 0.8 11/30/2022    CREAT 0.8 01/17/2022    CREAT 0.8 05/27/2021    CREAT 0.8 11/12/2020       Lab Results   Component Value Date    EGFR >60.0 02/05/2023    EGFR >60.0 11/30/2022    EGFR >60.0 01/17/2022    EGFR >60.0 05/27/2021    EGFR >60.0 11/12/2020         Current blood pressure is at goal (goal SBP<130; DBP<80). Continue to optimize low salt diet and aerobic exercise efforts. and Continue current medication regimen. Recommend optimizing therapeutic lifestyle changes which include obtaining at least 150 minutes of aerobic exercise per week and eating a heart healthy diet ( i.e. DASH diet - www.heart.org or popsteam.is ). Recommend checking ambulatory blood pressures once a day and document in a  log.  Bring the log and blood pressure cuff into the office at the next follow-up visit     2. Mixed hyperlipidemia  - Lipid Panel; Future  - Lipid Panel    Lab Results   Component Value Date    CHOL 216 (H) 02/05/2023    CHOL 224 (H) 11/30/2022    TRIG 108 02/05/2023    TRIG 109 11/30/2022    HDL 72 02/05/2023    HDL 66 11/30/2022    LDL 122 (H) 02/05/2023    LDL 136 (H) 11/30/2022     Will check updated labs and refill atorvastatin  accordingly     3. Muscle pain  - Creatine Kinase (CK); Future  - Creatine Kinase (CK)    4. Vitamin D  deficiency  - Vitamin D , 25 OH, Total; Future  - Vitamin D , 25 OH, Total        Josette HERO Stephaney Steven, NP       [1]   Patient Active Problem List  Diagnosis    Essential hypertension    Mixed hyperlipidemia    Gastroesophageal reflux disease    Hiatal hernia    Vitamin D  deficiency    S/P laparoscopic sleeve gastrectomy    Obesity (BMI 30-39.9)    LUQ abdominal pain    Chronic constipation   [2]   Current Outpatient Medications   Medication Sig Dispense Refill    atorvastatin  (LIPITOR) 20 MG tablet Take 1 tablet (20 mg) by mouth daily 90 tablet 1    estradiol  (ESTRACE ) 0.1 MG/GM vaginal cream PLACE 1 G VAGINALLY TWICE A WEEK TWICE A WEEK - USES DOSE MONDAYS AND WEDNESDAYS 42.5 g 0    Multiple Vitamin (multivitamin) capsule Take 1 capsule by mouth daily      vitamin A  3 MG (10000 UT) capsule Take 1 capsule (10,000 Units) by mouth daily 20 capsule 0    lisinopril  (ZESTRIL ) 10 MG tablet Take 1 tablet (10 mg) by mouth daily 90 tablet 1    Myrbetriq  25 MG Tablet SR 24 hr TAKE 1 TABLET BY MOUTH EVERY DAY FOR 30 DAYS (Patient not taking: Reported on 01/01/2024)      vitamin D  (CHOLECALCIFEROL) 25 MCG (1000 UT) tablet Take 5 tablets (5,000 Units) by mouth daily       No current facility-administered medications for this visit.   [3] No Known Allergies  [4]   Past Medical History:  Diagnosis Date    Abnormal vision     wears glasses    Constipation     HX - HAS BEEN MORE PREVELANT POST BARIATRIC  SURGERY.    COVID-19 10/2021    HX - 21//23 URGENT CARE NOTE IN EPIC.    Fibroids     PT HAD A PARTIAL HYSTERECTOMY - OVARIES ARE STILL IN PLACE.    Gastroesophageal reflux disease 10/23/2013    HX.    Hyperlipidemia     MANAGED WITH MEDS.    Hypertension     PT STATES HE BP RUNS FROM 128-130/70s. MANAGED WITH MEDS - FOLLOWED BY PCP.    Left wrist fracture 2018    HX    LUQ pain     HX    Psoriatic arthritis (CMS/HCC)     HX - LOWER BACK, LEFT HAND, WRIST AND FINGERS    Urinary incontinence 12/21/2017    HX OVERACTIVE BLADDER. ISSUE RESOLVED POST BARIATRIC SURGERY - PT STATED SHE LOST 65 LBS.   [5]   Past Surgical History:  Procedure Laterality Date    BUNIONECTOMY Left     COLONOSCOPY, DIAGNOSTIC (SCREENING)      X 2     COLONOSCOPY, DIAGNOSTIC (SCREENING) N/A 04/13/2021    Procedure: COLONOSCOPY;  Surgeon: Ulysess Foots, MD;  Location: [id number];  Service: Gastroenterology;  Laterality: N/A;    EGD N/A 05/18/2020    Procedure: EGD w/ bx's;  Surgeon: Marcey Sammi BIRCH, MD;  Location: [facility];  Service: Gastroenterology;  Laterality: N/A;    EGD  12/2020    EGD N/A 01/24/2022    Procedure: EGD w/ Bx's;  Surgeon: Marcey Sammi BIRCH, MD;  Location: [facility];  Service: Gastroenterology;  Laterality: N/A;    HYSTERECTOMY      PARTIAL - OVARIES IN PLACE.    LAPAROSCOPIC GASTRECTOMY SLEEVE WITH HIATAL HERNIA N/A 07/28/2020    Procedure: LAPAROSCOPIC GASTRECTOMY SLEEVE WITH HIATAL HERNIA, OMENTOPEXY, LAPAROSCOPIC RESECTION OF ANTRAL AND FUNDIC GASTRIC MASS;  Surgeon: Eliberto Oris SAUNDERS, DO;  Location: [facility];  Service: General;  Laterality: N/A;   [6]   Family History  Problem Relation Name Age of Onset    Hypertension Mother      Hypertension Father      Hypertension Sister      Hypertension Brother      Migraines Neg Hx      Stroke Neg Hx      Seizures Neg Hx     [7]   Social History  Tobacco Use    Smoking status: Never    Smokeless tobacco: Never    Tobacco comments:     Never  smoked or used tobacco   Vaping Use    Vaping status: Never Used   Substance Use Topics    Alcohol use: Yes     Comment: 1-2 DRINKS MONTHLY    Drug use: Never

## 2024-01-02 ENCOUNTER — Other Ambulatory Visit (INDEPENDENT_AMBULATORY_CARE_PROVIDER_SITE_OTHER): Payer: Self-pay | Admitting: Family

## 2024-01-02 DIAGNOSIS — E782 Mixed hyperlipidemia: Secondary | ICD-10-CM

## 2024-01-02 MED ORDER — ATORVASTATIN CALCIUM 20 MG PO TABS
20.0000 mg | ORAL_TABLET | Freq: Every day | ORAL | 1 refills | Status: DC
Start: 2024-01-02 — End: 2024-05-20

## 2024-01-03 ENCOUNTER — Encounter (INDEPENDENT_AMBULATORY_CARE_PROVIDER_SITE_OTHER): Payer: Self-pay | Admitting: Family

## 2024-03-08 ENCOUNTER — Encounter (INDEPENDENT_AMBULATORY_CARE_PROVIDER_SITE_OTHER): Payer: Self-pay

## 2024-03-08 ENCOUNTER — Ambulatory Visit (INDEPENDENT_AMBULATORY_CARE_PROVIDER_SITE_OTHER): Admitting: Family Medicine

## 2024-03-08 VITALS — BP 149/86 | HR 67 | Temp 97.0°F | Resp 14 | Ht 69.0 in | Wt 230.0 lb

## 2024-03-08 DIAGNOSIS — J019 Acute sinusitis, unspecified: Secondary | ICD-10-CM

## 2024-03-08 DIAGNOSIS — R0981 Nasal congestion: Secondary | ICD-10-CM

## 2024-03-08 LAB — POC COVID QUICKVUE ANTIGEN: QuickVue SARS COV2 Antigen POCT: NEGATIVE

## 2024-03-08 LAB — POCT INFLUENZA A/B
POCT Rapid Influenza A AG: NEGATIVE
POCT Rapid Influenza B AG: NEGATIVE

## 2024-03-08 MED ORDER — AMOXICILLIN-POT CLAVULANATE 875-125 MG PO TABS
1.0000 | ORAL_TABLET | Freq: Two times a day (BID) | ORAL | 0 refills | Status: AC
Start: 2024-03-08 — End: 2024-03-18

## 2024-03-18 ENCOUNTER — Encounter (INDEPENDENT_AMBULATORY_CARE_PROVIDER_SITE_OTHER): Payer: Self-pay

## 2024-03-19 NOTE — Progress Notes (Signed)
  Urgent Care  Progress Note    Patient: Barbara Rubio   Date: 03/08/2024   MRN: [id number]       Barbara Rubio is a 73 y.o. female      HISTORY     History obtained from: Patient    Chief Complaint:   Chief Complaint   Patient presents with    Nasal Congestion     Pt with 3 days hx of nasal congestion with cough. Feverish feeling. Runny nose is bothering her.         History:    Patient presents with nasal congestion and cough. Subjective fevers and runny nose. Denies fevers, chills or body aches. No diarrhea, vomiting or abdominal cramping. No history of asthma. Using OTC medication with some improvement.             Pertinent Past Medical, Surgical, Family and Social History were reviewed.      Review of Systems - As noted above. Otherwise negative.     Current Medications[1]    Allergies[2]    Medications and Allergies reviewed.    PHYSICAL EXAM     Vitals:    03/08/24 1325   BP: 149/86   BP Site: Left arm   Patient Position: Sitting   Cuff Size: Large   Pulse: 67   Resp: 14   Temp: 97 F (36.1 C)   TempSrc: Tympanic   SpO2: 99%   Weight: 104.3 kg (230 lb)   Height: 1.753 m (5' 9)       Physical Exam    Constitutional:       General: He is not in acute distress.     Appearance: He is well-developed.   HENT:      Head: Normocephalic and atraumatic. Pharyngeal Erythema  Cardiovascular:      Rate and Rhythm: Normal rate and regular rhythm.   Pulmonary:      Effort: Pulmonary effort is normal. No respiratory distress.      Breath sounds: Normal breath sounds. No stridor. No wheezing, rhonchi or rales.   Abdominal:      General: There is no distension.      Palpations: There is no mass.      Tenderness: There is no abdominal tenderness.      Hernia: No hernia is present.   Musculoskeletal: Strength and ROM grossly intact to all four extremities.   Neurological:      Mental Status: He is alert and oriented to person, place, and time.   Skin:     General: Skin is warm.   Chest:      Chest wall: No  tenderness.   Vitals and nursing note reviewed.       UCC COURSE     There were no labs reviewed with this patient during the visit.    There were no x-rays reviewed with this patient during the visit.    Current Inpatient Medications with Last Dose Taken[3]    PROCEDURES     Procedures      Chart Review:  Prior PCP, Specialist and/or ED notes reviewed today: Yes  Prior labs/images/studies reviewed today: Yes      Assessment & Plan     Encounter Diagnoses   Name Primary?    Nasal congestion Yes    Acute sinusitis, recurrence not specified, unspecified location               Barbara Rubio was seen today for nasal congestion.    Diagnoses  and all orders for this visit:    Nasal congestion  -     POCT Influenza A/B; Future  -     QuickVue SARS-COV-2 Antigen POCT; Future  -     POCT Influenza A/B  -     QuickVue SARS-COV-2 Antigen POCT    Acute sinusitis, recurrence not specified, unspecified location    Other orders  -     amoxicillin-clavulanate (AUGMENTIN) 875-125 MG per tablet; Take 1 tablet by mouth 2 (two) times daily for 10 days    PRN motrin or tylenol  for pain and fever  Discussed handwashing and proper disposal of tissues  Advised elevating head of bed for drainage  Encourage to use humidifier/steam shower  Saline nasal spray and/or netipot to thin and clear secretions  Increase oral fluid intake and rest  Warm fluids/broths to facilitate drainage  Honey is an effective cough suppressant            Orders Placed This Encounter   Procedures    POCT Influenza A/B    QuickVue SARS-COV-2 Antigen POCT     Requested Prescriptions     Signed Prescriptions Disp Refills    amoxicillin-clavulanate (AUGMENTIN) 875-125 MG per tablet 20 tablet 0     Sig: Take 1 tablet by mouth 2 (two) times daily for 10 days       Discussed results and diagnosis with patient/family.  Reviewed warning signs for worsening condition, as well as, indications for follow-up with primary care physician and return to urgent care clinic.  Patient/family  expressed understanding of instructions.     An After Visit Summary was provided to the patient.    Victorio Comer, DO    03/19/2024                 [1]   Current Outpatient Medications:     atorvastatin  (LIPITOR) 20 MG tablet, Take 1 tablet (20 mg) by mouth daily, Disp: 90 tablet, Rfl: 1    Diclofenac  Sodium CR 100 MG 24 hr tablet, Take by mouth once daily, Disp: , Rfl:     estradiol  (ESTRACE ) 0.1 MG/GM vaginal cream, PLACE 1 G VAGINALLY TWICE A WEEK TWICE A WEEK - USES DOSE MONDAYS AND WEDNESDAYS, Disp: 42.5 g, Rfl: 0    lisinopril  (ZESTRIL ) 10 MG tablet, Take 1 tablet (10 mg) by mouth daily, Disp: 90 tablet, Rfl: 1    Multiple Vitamin (multivitamin) capsule, Take 1 capsule by mouth daily, Disp: , Rfl:     vitamin A  3 MG (10000 UT) capsule, Take 1 capsule (10,000 Units) by mouth daily, Disp: 20 capsule, Rfl: 0    vitamin D  (CHOLECALCIFEROL) 25 MCG (1000 UT) tablet, Take 5 tablets (5,000 Units) by mouth daily, Disp: , Rfl:     Myrbetriq 25 MG Tablet SR 24 hr, TAKE 1 TABLET BY MOUTH EVERY DAY FOR 30 DAYS (Patient not taking: Reported on 03/08/2024), Disp: , Rfl:   [2] No Known Allergies  [3]   No current facility-administered medications for this visit.

## 2024-03-21 ENCOUNTER — Other Ambulatory Visit (INDEPENDENT_AMBULATORY_CARE_PROVIDER_SITE_OTHER): Payer: Self-pay | Admitting: Family

## 2024-03-21 NOTE — Telephone Encounter (Signed)
 LV- 01/01/24    RX Pended, please advise and refill or reject if appropriate. Unable to Refill for the diagnosis on meds.  Last Labs:  Lab Results   Component Value Date    WBC 3.55 01/01/2024    HGB 12.7 01/01/2024    HCT 41.8 01/01/2024    PLT 128 (L) 01/01/2024    CHOL 235 (H) 01/01/2024    TRIG 116 01/01/2024    HDL 71 01/01/2024    LDL 141 (H) 01/01/2024    ALT 15 01/01/2024    AST 24 01/01/2024    NA 139 01/01/2024    K 4.2 01/01/2024    CL 106 01/01/2024    CREAT 0.9 01/01/2024    BUN 11 01/01/2024    CO2 26 01/01/2024    TSH 1.45 11/12/2020    INR 1.0 06/09/2020    GLU 87 01/01/2024    HGBA1C 5.5 06/09/2020

## 2024-03-30 ENCOUNTER — Encounter (INDEPENDENT_AMBULATORY_CARE_PROVIDER_SITE_OTHER): Payer: Self-pay

## 2024-04-01 ENCOUNTER — Ambulatory Visit (INDEPENDENT_AMBULATORY_CARE_PROVIDER_SITE_OTHER): Payer: Medicare Other | Admitting: Family Medicine

## 2024-04-07 NOTE — Progress Notes (Signed)
 Called patient to clarify information. Patient didn't answer left vm to call back

## 2024-04-09 NOTE — Progress Notes (Signed)
 Appt cancelled

## 2024-04-11 ENCOUNTER — Ambulatory Visit (INDEPENDENT_AMBULATORY_CARE_PROVIDER_SITE_OTHER): Admitting: Family Medicine

## 2024-04-29 ENCOUNTER — Encounter (INDEPENDENT_AMBULATORY_CARE_PROVIDER_SITE_OTHER): Payer: Self-pay

## 2024-05-07 ENCOUNTER — Telehealth: Payer: Self-pay | Admitting: Family

## 2024-05-07 DIAGNOSIS — Z1231 Encounter for screening mammogram for malignant neoplasm of breast: Secondary | ICD-10-CM

## 2024-05-07 NOTE — Telephone Encounter (Signed)
 Copied from CRM [id number]. Topic: Clinical Support - Medical Question  >> May 07, 2024  1:51 PM Cathlean ORN wrote:  Barbara Rubio called about Clinical Support - Medical Question.  Additional details:  Patient called in requesting a mammogram order to be placed in my chart so she can go get her yearly mammogram. Please advise

## 2024-05-09 NOTE — Telephone Encounter (Signed)
Order done . Please fax.thanks

## 2024-05-20 ENCOUNTER — Ambulatory Visit (INDEPENDENT_AMBULATORY_CARE_PROVIDER_SITE_OTHER): Admitting: Family

## 2024-05-20 ENCOUNTER — Encounter (INDEPENDENT_AMBULATORY_CARE_PROVIDER_SITE_OTHER): Payer: Self-pay

## 2024-05-20 ENCOUNTER — Encounter (INDEPENDENT_AMBULATORY_CARE_PROVIDER_SITE_OTHER): Payer: Self-pay | Admitting: Family

## 2024-05-20 VITALS — BP 136/85 | HR 71 | Temp 98.2°F | Ht 65.75 in | Wt 227.0 lb

## 2024-05-20 DIAGNOSIS — I10 Essential (primary) hypertension: Secondary | ICD-10-CM

## 2024-05-20 DIAGNOSIS — R9230 Dense breasts, unspecified: Secondary | ICD-10-CM

## 2024-05-20 DIAGNOSIS — E559 Vitamin D deficiency, unspecified: Secondary | ICD-10-CM

## 2024-05-20 DIAGNOSIS — Z1231 Encounter for screening mammogram for malignant neoplasm of breast: Secondary | ICD-10-CM

## 2024-05-20 DIAGNOSIS — R29890 Loss of height: Secondary | ICD-10-CM

## 2024-05-20 DIAGNOSIS — E782 Mixed hyperlipidemia: Secondary | ICD-10-CM

## 2024-05-20 DIAGNOSIS — Z0000 Encounter for general adult medical examination without abnormal findings: Secondary | ICD-10-CM

## 2024-05-20 DIAGNOSIS — Z780 Asymptomatic menopausal state: Secondary | ICD-10-CM

## 2024-05-20 MED ORDER — ATORVASTATIN CALCIUM 20 MG PO TABS
20.0000 mg | ORAL_TABLET | Freq: Every day | ORAL | 1 refills | Status: DC
Start: 2024-05-20 — End: 2024-05-30

## 2024-05-20 MED ORDER — SEMAGLUTIDE-WEIGHT MANAGEMENT 0.25 MG/0.5ML SC SOAJ
0.2500 mg | SUBCUTANEOUS | 0 refills | Status: AC
Start: 2024-05-20 — End: 2024-06-11

## 2024-05-20 MED ORDER — ESTRADIOL 0.1 MG/GM VA CREA
1.0000 g | TOPICAL_CREAM | VAGINAL | 0 refills | Status: DC
Start: 2024-05-20 — End: 2024-08-08

## 2024-05-20 MED ORDER — LISINOPRIL 10 MG PO TABS
10.0000 mg | ORAL_TABLET | Freq: Every day | ORAL | 1 refills | Status: DC
Start: 2024-05-20 — End: 2024-06-13

## 2024-05-20 NOTE — Progress Notes (Signed)
 [facility]   Medicare Wellness Visit                 Barbara Rubio is a 73 y.o. female who presents today for the following Medicare Wellness Visit:  []  Initial Preventive Physical Exam (IPPE) - Welcome to Medicare preventive visit (Vision Screening required)   []  Annual Wellness Visit - Initial  [x]  Annual Wellness Visit - Subsequent                                                                                                                                                 Health Risk Assessment   During the past month, how would you rate your general health?:  (Patient-Rptd) (P) Good  Which of the following tasks can you do without assistance - drive or take the bus alone; shop for groceries or clothes; prepare your own meals; do your own housework/laundry; handle your own finances/pay bills; eat, bathe or get around your home?: (Patient-Rptd) (P) Drive or take the bus alone, Shop for groceries or clothes, Prepare your own meals, Do your own housework/laundry, Handle your own finances/pay bills, Eat, bathe, dress or get around your home  Which of the following problems have you been bothered by in the past month - dizzy when standing up; problems using the phone; feeling tired or fatigued; moderate or severe body pain?: (Patient-Rptd) (P) None of these  Do you exercise for about 20 minutes 3 or more days per week?:(Patient-Rptd) (P) Yes  During the past month was someone available to help if you needed and wanted help?  For example, if you felt nervous, lonely, got sick and had to stay in bed, needed someone to talk to, needed help with daily chores or needed help just taking care of yourself.: (Patient-Rptd) (P) Yes  Do you always wear a seat belt?: (Patient-Rptd) (P) Yes  Do you have any trouble taking medications the way you have been told to take them?: (Patient-Rptd) (P) No  Have you been given any information that can help you with keeping track of your medications?: (Patient-Rptd) (P)  No  Do you have trouble paying for your medications?: (Patient-Rptd) (P) No  Have you been given any information that can help you with hazards in your house, such as scatter rugs, furniture, etc?: (Patient-Rptd) (P) Yes  Do you feel unsteady when standing or walking?: (Patient-Rptd) (P) No  Do you worry about falling?: (Patient-Rptd) (P) No  Have you fallen two or more times in the past year?: (Patient-Rptd) (P) No  Did you suffer any injuries from your falls in the past year?: (Patient-Rptd) (P) No    Hospitalizations   Hospitalization within past year: [x]  No  []  Yes     Diagnosis:    Screenings         04/03/2024 05/15/2024 05/17/2024   Ambulatory Screenings  Falls Risk: Terrilee more than 2 times in past year N N    Falls Risk: Suffer any injuries? N N    Depression: PHQ2 Total Score   1    Depression: PHQ9 Total Score   1        Patient-reported        Substance Use Disorder Screen:  In the past year, how often have you used the following?  1) Alcohol (For men, 5 or more drinks a day. For women, 4 or more drinks a day)  [x]  Never []  Once or Twice []  Monthly []  Weekly []  Daily or Almost Daily  2) Tobacco Products  [x]  Never []  Once or Twice []  Monthly []  Weekly []  Daily or Almost Daily  3) Prescription Drugs for Non-Medical Reasons  [x]  Never []  Once or Twice []  Monthly []  Weekly []  Daily or Almost Daily  4) Illegal Drugs  [x]  Never []  Once or Twice []  Monthly []  Weekly []  Daily or Almost Daily            Functional Ability/Level of Safety   Falls Risk/Home Safety Assessment:  (see HRA and Screenings sections for additional assessment)    Home Safety:   []  Low Risk for Falls  [x]  Skid-resistant rugs/remove throw rugs   []  Grab bars    [x]  Clear pathways between rooms    [x]  Proper lighting stairs/ bathrooms/bedrooms    Get Up and Go (optional):    []   <20 secs    []   >20 secs    []   High risk for falls - Home Safety/Falls Risk Precautions reviewed with pt/family    Hearing Assessment:  Concerns for hearing loss:  [x]  Yes  []   No  Hearing aids:   []   Right  []   Left  []   Bilateral   [x]   None  Whisper Test (optional):  []  Normal  []   Slightly decreased  []   Significantly decreased    Visual Acuity:     If no visual acuity exam above:  [x] Patient Declines  []  Up to date with Optometry/Ophthalmology per patient report.    Exercise:  Frequency:  [x]   No formal exercise  []   1-2x/wk  []   3-4x/wk  []   >4x/wk  Duration:  []   15-30 mins/day  []   30-45 mins/day  []   45+ mins/day  Intensity:  []   Light  []   Moderate  []   Heavy    Diet:  Diet: - consumes a well balanced diet      Activities of Daily Living     ADL's Independent Minimal  Assistance Moderate  Assistance Total   Assistance   Bathing [x]  []  []  []    Dressing [x]  []  []  []    Mobility   [x]  []  []  []    Transfer [x]  []  []  []    Eating [x]  []  []  []    Toileting [x]  []  []  []      IADL's Independent Minimal  Assistance Moderate  Assistance Total   Assistance   Phone [x]  []  []  []    Housekeeping [x]  []  []  []    Laundry [x]  []  []  []    Transportation [x]  []  []  []    Medications [x]  []  []  []    Finances [x]  []  []  []       ADL assistance:   [x]  No assistance needed    []  Spouse    []  Sibling    []  Son   []  Daughter   []  Children    []  Home Health Aide   []   Other:    Social Activities/Engagement   Frequency of Communication with Friends and Family: daily  []  Never []  Rarely []  Sometimes []  Often[x]  Very often    Frequency of Social Gatherings with Friends and Family:   []  Never []  Rarely []  Sometimes [x]  Often[]  Very often    Advanced Care Planning   Discussion of Advance Directives:   [x]  Advance Directive in chart    []  Advance Directive not in chart - requested to provide   []  No Advance Directive.  Form Provided    []  No Advance Directive.  Pt declines.   []  Not addressed today    []  Other:         Exam   Ht 1.67 m (5' 5.75)   Wt 103 kg (227 lb)   BMI 36.92 kg/m   Physical Exam  Vitals reviewed.   Constitutional:       General: She is not in acute distress.     Appearance: Normal  appearance. She is not ill-appearing.   HENT:      Head: Normocephalic and atraumatic.      Right Ear: Tympanic membrane, ear canal and external ear normal.      Left Ear: Tympanic membrane, ear canal and external ear normal.      Mouth/Throat:      Mouth: Mucous membranes are moist.      Pharynx: Oropharynx is clear.   Eyes:      Conjunctiva/sclera: Conjunctivae normal.   Cardiovascular:      Rate and Rhythm: Normal rate and regular rhythm.      Pulses: Normal pulses.      Heart sounds: Normal heart sounds.   Pulmonary:      Effort: Pulmonary effort is normal.      Breath sounds: Normal breath sounds.   Abdominal:      General: Bowel sounds are normal.      Palpations: Abdomen is soft.   Musculoskeletal:         General: Normal range of motion.      Cervical back: Normal range of motion and neck supple.   Skin:     General: Skin is warm and dry.      Capillary Refill: Capillary refill takes less than 2 seconds.   Neurological:      General: No focal deficit present.      Mental Status: She is alert and oriented to person, place, and time.   Psychiatric:         Mood and Affect: Mood normal.         Thought Content: Thought content normal.             Evaluation of Cognitive Function   Mood/affect: [x]  Appropriate  []   Other:   Appearance: [x]  Neatly groomed  [x]  Adequately nourished  []  Other:  Family member/caregiver input: []  Present - no concerns  []   Not present in room  []  Present - concerns:    Cognitive Assessment:  Mini-Cog Result (three word registration- banana, sunrise, chair / clock drawing):   [x]   > 3 points - negative screen for dementia   []  3 recalled words - negative screen for dementia   []  1-2 recalled words and normal clock draw - negative for cognitive impairment   []  1-2 recalled words and abnormal clock draw - positive for cognitive impairment   []  0 recalled words - positive for cognitive impairment         Personalized Prevention Plan  The patient was provided with a personalized  prevention plan via   the Patient Instructions tab of this visit                                                                                                                                                 Assessment/Plan     Assessment & Plan  Encounter for subsequent annual wellness visit in Medicare patient         Essential hypertension    Orders:    CBC with Differential (Order); Future    Comprehensive Metabolic Panel; Future    Thyroid Stimulating Hormone (TSH) with Reflex to Free T4; Future    Urine Microalbumin, Random; Future    lisinopril  (ZESTRIL ) 10 MG tablet; Take 1 tablet (10 mg) by mouth once daily      BP Readings from Last 3 Encounters:   05/20/24 136/85   03/08/24 149/86   01/01/24 138/67     Potassium   Date Value Ref Range Status   01/01/2024 4.2 3.5 - 5.3 mEq/L Final   02/05/2023 4.5 3.5 - 5.3 mEq/L Final   11/30/2022 4.8 3.5 - 5.3 mEq/L Final   01/17/2022 4.5 3.5 - 5.3 mEq/L Final     Renal Function Trend:  Lab Results   Component Value Date    CREAT 0.9 01/01/2024    CREAT 0.8 02/05/2023    CREAT 0.8 11/30/2022    CREAT 0.8 01/17/2022    CREAT 0.8 05/27/2021       Lab Results   Component Value Date    EGFR >60.0 01/01/2024    EGFR >60.0 02/05/2023    EGFR >60.0 11/30/2022    EGFR >60.0 01/17/2022    EGFR >60.0 05/27/2021         Current blood pressure is near goal (goal SBP<130; DBP<80). Continue to optimize low salt diet and aerobic exercise efforts. and Continue current medication regimen. Recommend optimizing therapeutic lifestyle changes which include obtaining at least 150 minutes of aerobic exercise per week and eating a heart healthy diet ( i.e. DASH diet - www.heart.org or PopSteam.is ). Recommend checking ambulatory blood pressures once or twice a day and document in a log.  Bring the log and blood pressure cuff into the office at the next follow-up visit     Mixed hyperlipidemia    Orders:    Lipid Panel; Future    atorvastatin  (LIPITOR) 20 MG tablet; Take 1 tablet (20 mg)  by mouth once daily  Dyslipidemia:  Lab Results   Component Value Date    Cholesterol 235 (H) 01/01/2024    Cholesterol 216 (H) 02/05/2023    Cholesterol 224 (H) 11/30/2022    Triglycerides 116 01/01/2024    Triglycerides 108 02/05/2023    Triglycerides 109 11/30/2022    HDL 71 01/01/2024    HDL 72 02/05/2023  HDL 66 11/30/2022    LDL Calculated 141 (H) 01/01/2024    LDL Calculated 122 (H) 02/05/2023    LDL Calculated 136 (H) 11/30/2022     Restarted lipid-plan to recheck when fasting  Vitamin D  deficiency    Orders:    Vitamin D , 25 OH, Total; Future    Screening mammogram, encounter for    Orders:    Mammo Screening 3D/Tomo Bilateral; Future    Dense breast    Orders:    Mammo Screening 3D/Tomo Bilateral; Future    Post-menopausal    Orders:    Dxa Bone Density Appendicular Skeleton; Future    Height loss    Orders:    Dxa Bone Density Appendicular Skeleton; Future    Obesity, morbid (CMS/HCC)    Orders:    semaglutide  (Wegovy ) 0.25 MG/0.5ML injection; Inject 0.5 mLs (0.25 mg) into the skin once a week for 4 doses  Reviewed risks/benefits of phentermine , contrave, and GLP1 medication. Pt aware Medicare may not cover GLP1 for weight loss. If not covered pt would like to try phentermine  x 12 weeks. Side effects reviewed. Pt aware she will have to closely monitor BP and plan to start at low dose to ensure medication is tolerated        History/Care Team   Patient Care Team:  Twanna Cambridge, MD as PCP - General (Internal Medicine)  Eliberto Oris SAUNDERS, DO as Consulting Physician (Surgery)  [x]  Medical, surgical, family history reviewed and updated during this encounter  [x]  Medication list updated and reconciled during this encounter    Additional Documentation   Pt would like to discuss weight loss  Pt prioritizes protein- protein with all meals (breakfast-egg, bacon or sausage, oatmeal, lunch and dinner- lean meat and vegetables)  Pt endorses she avoids sweets  For exercise pt walks frequently    Pt with hx of  gastric bypass in 2021 which was initially helpful but now gaining weight back  No hx of medullary thyroid cancer nor pancreatitis   Prescribed topamax  and vitamin A  through bariatrics last year  Of note, pt does seem to have more bladder issues with her weight gain as well    Wt Readings from Last 3 Encounters:   05/20/24 103 kg (227 lb)   03/08/24 104.3 kg (230 lb)   01/01/24 100.4 kg (221 lb 6.4 oz)

## 2024-05-20 NOTE — Progress Notes (Signed)
 DONIA SLADE (Key: [id number])  Wegovy  0.25MG /0.5ML auto-injectors  Form  [employer] Electronic PA Form [phone] NCPDP)  Created  9 minutes ago  Sent to Plan  7 minutes ago  Plan Response  7 minutes ago  Submit Clinical Questions  3 minutes ago  Determination  Wait for Determination  Please wait for [employer] NCPDP 2017 to return a determination.

## 2024-05-20 NOTE — Assessment & Plan Note (Addendum)
 Orders:    semaglutide  (Wegovy ) 0.25 MG/0.5ML injection; Inject 0.5 mLs (0.25 mg) into the skin once a week for 4 doses  Reviewed risks/benefits of phentermine , contrave, and GLP1 medication. Pt aware Medicare may not cover GLP1 for weight loss. If not covered pt would like to try phentermine  x 12 weeks. Side effects reviewed. Pt aware she will have to closely monitor BP and plan to start at low dose to ensure medication is tolerated

## 2024-05-20 NOTE — Assessment & Plan Note (Addendum)
 Orders:    Lipid Panel; Future    atorvastatin  (LIPITOR) 20 MG tablet; Take 1 tablet (20 mg) by mouth once daily  Dyslipidemia:  Lab Results   Component Value Date    Cholesterol 235 (H) 01/01/2024    Cholesterol 216 (H) 02/05/2023    Cholesterol 224 (H) 11/30/2022    Triglycerides 116 01/01/2024    Triglycerides 108 02/05/2023    Triglycerides 109 11/30/2022    HDL 71 01/01/2024    HDL 72 02/05/2023    HDL 66 11/30/2022    LDL Calculated 141 (H) 01/01/2024    LDL Calculated 122 (H) 02/05/2023    LDL Calculated 136 (H) 11/30/2022     Restarted lipid-plan to recheck when fasting

## 2024-05-20 NOTE — Assessment & Plan Note (Addendum)
 Orders:    CBC with Differential (Order); Future    Comprehensive Metabolic Panel; Future    Thyroid Stimulating Hormone (TSH) with Reflex to Free T4; Future    Urine Microalbumin, Random; Future    lisinopril  (ZESTRIL ) 10 MG tablet; Take 1 tablet (10 mg) by mouth once daily      BP Readings from Last 3 Encounters:   05/20/24 136/85   03/08/24 149/86   01/01/24 138/67     Potassium   Date Value Ref Range Status   01/01/2024 4.2 3.5 - 5.3 mEq/L Final   02/05/2023 4.5 3.5 - 5.3 mEq/L Final   11/30/2022 4.8 3.5 - 5.3 mEq/L Final   01/17/2022 4.5 3.5 - 5.3 mEq/L Final     Renal Function Trend:  Lab Results   Component Value Date    CREAT 0.9 01/01/2024    CREAT 0.8 02/05/2023    CREAT 0.8 11/30/2022    CREAT 0.8 01/17/2022    CREAT 0.8 05/27/2021       Lab Results   Component Value Date    EGFR >60.0 01/01/2024    EGFR >60.0 02/05/2023    EGFR >60.0 11/30/2022    EGFR >60.0 01/17/2022    EGFR >60.0 05/27/2021         Current blood pressure is near goal (goal SBP<130; DBP<80). Continue to optimize low salt diet and aerobic exercise efforts. and Continue current medication regimen. Recommend optimizing therapeutic lifestyle changes which include obtaining at least 150 minutes of aerobic exercise per week and eating a heart healthy diet ( i.e. DASH diet - www.heart.org or PopSteam.is ). Recommend checking ambulatory blood pressures once or twice a day and document in a log.  Bring the log and blood pressure cuff into the office at the next follow-up visit

## 2024-05-20 NOTE — Patient Instructions (Signed)
 MEDICARE WELLNESS PERSONAL PREVENTION PLAN   As part of the Medicare Wellness portion of your visit today, we are providing you with this personalized preventative plan of care. The list below includes many common screening recommendations from the USPSTF ([place]  Preventive Services Task Force) but is not meant to be comprehensive. You may be eligible for other preventative services depending upon your personal risk factors.     Health Maintenance   Topic Date Due    Pap Smear  Never done    Medicare Annual Wellness Visit  04/01/2024    Influenza Vaccine  05/23/2024    COVID-19 Vaccine (10 - 2024-25 season) 09/21/2024    Statin Use  03/31/2025    Breast Cancer Screening - Mammogram  05/13/2025    FALLS RISK ANNUAL  05/15/2025    DEPRESSION SCREENING  05/17/2025    Tetanus Ten-Year  07/30/2028    Colorectal Cancer Screening  04/14/2031    HEPATITIS C SCREENING  Completed    DXA Scan  Completed    Shingrix Vaccine 50+  Completed    Pneumonia Vaccine Age 58 Years and Older  Completed     Health Maintenance Topics with due status: Overdue       Topic Date Due    Pap Smear Never done    Medicare Annual Wellness Visit 04/01/2024      Immunization History   Administered Date(s) Administered    COVID-19 mRNA MONOVALENT vaccine PRIMARY SERIES 12 years and above [employer]) 30 mcg/0.3 mL (DILUTE BEFORE USE) 11/21/2019, 11/21/2019, 12/15/2019, 12/15/2019, 08/14/2020    COVID-19 mRNA MONOVALENT vaccine PRIMARY SERIES 12 years and above [employer]) 30 mcg/0.3 mL (DO NOT DILUTE) 03/11/2021    COVID-19 mRNA seasonal vaccine 12 years and above [employer]) 30 mcg/0.3 mL 03/22/2024    INFLUENZA TRIVALENT PF SD 6 MO AND OLDER (FLUARIX/FLULAVAL) 06/22/2015    Influenza quadrivalent (FLUAD) adujvanted,  65 years and older, 0.5 mL, preservative free 07/25/2019    Influenza quadrivalent high-dose (FLUZONE HIGH-DOSE) 65 years and older, 0.7 mL, preservative free 07/16/2021    Influenza trivalent high-dose (FLUZONE HIGH-DOSE), 65 years and  older, preservative free 10/26/2017, 07/30/2018, 06/22/2020    Pneumococcal conjugate (PREVNAR 20) 20-valent, preservative free 03/23/2021    Pneumococcal conjugate (PREVNAR) 13-valent 10/29/2017, 05/31/2018    Pneumococcal polysaccharide (PNEUMOVAX 23), 23-valent 07/30/2018    Tdap (tetanus, diphtheria reduced, acellular pertussis) (ADACEL/BOOSTRIX), adsorbed 07/30/2018        Your major risk factors:   Recommendations for improvement:      Colorectal Cancer Screening - All adults 45-75 yrs should undergo periodic colorectal cancer screening. The decision to screen for colorectal cancer in adults aged 51 to 5 years should be an individual one, taking into account your overall health and prior screening history.   Breast Cancer Screening - Women aged 40-75yrs should have mammograms every other year (please note that this recommendation may not be appropriate for every woman - your physician can answer specific questions you may have). The USPSTF concludes that the current evidence is insufficient to assess the balance of benefits and harms of screening mammography in women aged 94 years or older.  These recommendations do not apply to persons who have a genetic marker or syndrome associated with a high risk of breast cancer (eg, BRCA1 or BRCA2 genetic variation), a history of high-dose radiation therapy to the chest at a young age, or previous breast cancer or a high-risk breast lesion on previous biopsies.   Cervical Cancer Screening - Women over 82  do not require pap smears as long as prior screening has been normal and are not otherwise at high risk for cervical cancer. The USPSTF recommends screening for cervical cancer every 3 years with cervical cytology alone in women aged 80 to 61 years. For women aged 5 to 28 years, the USPSTF recommends screening every 3 years with cervical cytology alone, every 5 years with high-risk human papillomavirus (hrHPV) testing alone, or every 5 years with hrHPV testing in  combination with cytology (cotesting).   Osteoporosis Screening -  The USPSTF recommends screening for osteoporosis with bone measurement testing to prevent osteoporotic fractures in women 65 years and older.  For postmenopausal women younger than 65 years who are at increased risk of osteoporosis, as determined by a formal clinical risk assessment tool, the USPSTF recommends screening for osteoporosis with bone measurement testing to prevent osteoporotic fractures.   Hepatitis C Screening - Recommend screening for hepatitis C virus (HCV) infection in all adults aged 50 to 44 years.  Lung cancer Screening - Recommend annual screening for lung cancer with low-dose computed tomography (LDCT) in adults ages 14 to 69 years who have a 20 pack-year smoking history and currently smoke or have quit within the past 15 years.  Recommended Vaccinations from the CDC ([place].S.  Centers for Disease Control and Prevention)   Influenza one dose annually   COVID vaccine - stay current with the most up-to-date COVID update/booster  Tetanus/diphtheria (Tdap) one booster every 10 years   Zoster/Shingles - (Shingrix) two doses after age 65 (second dose given 2-6 months after first dose)  Pneumococcal 20-valent or 21-valent conjugate vaccine - one dose for adults aged >=65 years with no history of prior pneumococcal vaccination.  Pneumococcal 20-valent or 21-valent conjugate vaccine -  one dose for adults aged >=65 years with PPSV23 only or PCV13 only given more than 1 yr ago.  Pneumococcal 20-valent or 21-valent conjugate vaccine - one dose for adults aged >=65 years who have completed PCV-13 and PPSV23 after 72yrs old and it has been greater than 61yrs (shared clinical decision-making is recommended regarding administration of this vaccine).   RSV (Respiratory Syncytial Virus) vaccine for everyone ages 70 and older  RSV (Respiratory Syncytial Virus)  vaccine ages 51-74 who are at increased risk of severe RSV disease (for example, chronic  heart/lung/liver/kidney disease, immunocompromised)    PERSONAL PREVENTION PLAN   Your Personal Prevention Plan is based on your overall health and your responses to the health questionnaire you completed. The following information is for you to review in addition to the recommendations, referrals, and tests we have discussed at your visit.     Physical Activity:   Physical activity can help you maintain a healthy weight, prevent or control illness, reduce stress, and sleep better. It can also help you improve your balance to avoid falls. Try to build up to and maintain a total of 30 minutes of activity each day. If you are able, try walking, doing yard or housework, and taking the stairs more often. You can also strengthen your muscles with exercises done while sitting or lying down. Web resource - https://www.carpenter-henry.info/  Emotional Health:   Feeling "down in the dumps" or anxious every now and then is a natural part of life. If this feeling lasts for a few weeks or more, talk with me as soon as possible. It could be a sign of a problem that needs treatment. There are many types of treatment available. Web resource -  RXPreview.de  Falls:  You can reduce your risk of falling by making changes in your home. Remove items that may cause tripping, improve lighting, and consider installing grab bars.   Talk with me if you have problems with balance and walking. To prevent falls, you may need your vision, hearing, or blood pressure checked. Exercises to improve your strength and balance, or using a cane or walker, may help. Review your medicines with me at every visit, because some can affect balance. Please be sure to let me know if you fall or are fearful you may fall. Web resource - BounceThru.fi  Urinary Leakage:   Urine leakage is common, but it is not a normal part of aging. Talk with me about any urine leakage so that the cause can be found  and treated. Treatment can include bladder training, exercises, medicine or surgery.   Pain:   We all have aches and pains at times, but chronic pain can change how you feel and live every day. Please talk with me about any symptoms of chronic pain so that we can determine how best to treat.   Sleep:   Getting a good night's sleep is vital to your health and well-being and can help prevent or manage health problems. Often, sleep can be improved by changing behaviors, including when you go to bed and what you do before bed. Sleep apnea can cause problems such as struggling to stay awake during the day. Please let me know if you would like to learn more about improving your sleep and/or think you may have sleep apnea. Web resource -  ThousandQuestions.com.cy  Seat Belt:   Please remember to wear a seat belt when driving or riding in a vehicle. It is one of the most important things you can do to stay safe in a car.   Nutrition:   Remember to eat plenty of fruits, vegetables, whole grains, and dairy. Drink at least 64 ounces (8 full glasses) of water a day, unless you have been advised to limit fluids.  Web resource- PickSeat.dk  Alcohol:   Alcohol can have a greater effect on older people, who may feel its effects at a lower amount. Older people should limit alcoholic drinks (no more than one a day for women and no more than two a day for men). Please let me know if alcohol use becomes a problem.  Web resource - AgingMortgage.ca  Tobacco:   Not smoking or using other forms of tobacco is one of the most important things you can do for your health. Here is some more information about the importance of quitting smoking and how to quit smoking - Mudlogger - BroadJournal.com.pt  Advance Directives:   There may come a time when medical decisions need to be made on your behalf. Please talk with your family, and with  me, about your wishes. It is important to provide information about your decisions, and any formal advance directives, for your medical record. Here is additional information on advanced directives - Web resource - MediaExhibitions.no  Additional Support:   Sometimes it can be challenging to manage all aspects of daily life. Finding the right support can help you maintain or improve your health and independence. Please let me know if you would like to talk further about finding resources to assist you.

## 2024-05-20 NOTE — Assessment & Plan Note (Addendum)
 Orders:    Vitamin D, 25 OH, Total; Future

## 2024-05-21 ENCOUNTER — Other Ambulatory Visit: Payer: Self-pay | Admitting: Family

## 2024-05-21 ENCOUNTER — Encounter (INDEPENDENT_AMBULATORY_CARE_PROVIDER_SITE_OTHER): Payer: Self-pay | Admitting: Family

## 2024-05-21 ENCOUNTER — Other Ambulatory Visit (INDEPENDENT_AMBULATORY_CARE_PROVIDER_SITE_OTHER): Payer: Self-pay | Admitting: Family

## 2024-05-21 MED ORDER — PHENTERMINE HCL 15 MG PO CAPS
15.0000 mg | ORAL_CAPSULE | Freq: Every morning | ORAL | 0 refills | Status: DC
Start: 2024-05-21 — End: 2024-08-08

## 2024-05-21 MED ORDER — PHENTERMINE HCL 37.5 MG PO CAPS
37.5000 mg | ORAL_CAPSULE | Freq: Every morning | ORAL | 1 refills | Status: DC
Start: 2024-05-26 — End: 2024-08-08

## 2024-05-21 NOTE — Progress Notes (Signed)
 Medication denied. Not covered by Medicare Part D. Patient and provider notified.

## 2024-05-22 ENCOUNTER — Ambulatory Visit (INDEPENDENT_AMBULATORY_CARE_PROVIDER_SITE_OTHER): Payer: Self-pay | Admitting: Family

## 2024-05-24 ENCOUNTER — Emergency Department
Admission: EM | Admit: 2024-05-24 | Discharge: 2024-05-24 | Disposition: A | Discharge status: Home / Self Care | Attending: Emergency Medicine | Admitting: Emergency Medicine

## 2024-05-24 ENCOUNTER — Emergency Department

## 2024-05-24 DIAGNOSIS — M25552 Pain in left hip: Secondary | ICD-10-CM | POA: Insufficient documentation

## 2024-05-24 DIAGNOSIS — M79652 Pain in left thigh: Secondary | ICD-10-CM | POA: Insufficient documentation

## 2024-05-24 DIAGNOSIS — M25562 Pain in left knee: Secondary | ICD-10-CM | POA: Insufficient documentation

## 2024-05-24 MED ORDER — ACETAMINOPHEN 500 MG PO TABS
1000.0000 mg | ORAL_TABLET | Freq: Once | ORAL | Status: AC
Start: 2024-05-24 — End: 2024-05-24
  Administered 2024-05-24: 1000 mg via ORAL
  Filled 2024-05-24: qty 2

## 2024-05-24 MED ORDER — IBUPROFEN 600 MG PO TABS
600.0000 mg | ORAL_TABLET | Freq: Once | ORAL | Status: AC
Start: 2024-05-24 — End: 2024-05-24
  Administered 2024-05-24: 600 mg via ORAL
  Filled 2024-05-24: qty 1

## 2024-05-24 MED ORDER — LIDOCAINE 5 % EX PTCH
1.0000 | MEDICATED_PATCH | Freq: Once | CUTANEOUS | Status: DC
Start: 2024-05-24 — End: 2024-05-24
  Administered 2024-05-24: 1 via TRANSDERMAL
  Filled 2024-05-24: qty 1

## 2024-05-24 MED ORDER — LIDOCAINE 5 % EX PTCH
1.0000 | MEDICATED_PATCH | CUTANEOUS | 0 refills | Status: DC
Start: 2024-05-24 — End: 2024-08-08

## 2024-05-24 MED ORDER — CYCLOBENZAPRINE HCL 10 MG PO TABS
5.0000 mg | ORAL_TABLET | Freq: Three times a day (TID) | ORAL | 0 refills | Status: AC | PRN
Start: 2024-05-24 — End: 2024-06-05

## 2024-05-24 MED ORDER — PREDNISONE 20 MG PO TABS
40.0000 mg | ORAL_TABLET | Freq: Every day | ORAL | 0 refills | Status: AC
Start: 2024-05-24 — End: 2024-05-29

## 2024-05-24 NOTE — ED Provider Notes (Signed)
 [facility]  Emergency Department Physician Note      Diagnosis/Disposition     ED Disposition:  Discharge from ED Extended Visit    ED Diagnosis:     Left hip pain  Pain of left lateral upper thigh    Discharge Medication List as of 05/24/2024  1:46 PM        START taking these medications    Details   cyclobenzaprine  (FLEXERIL ) 10 MG tablet Take 0.5 tablets (5 mg) by mouth 3 (three) times daily as needed for Muscle spasms, Starting Sat 05/24/2024, Until Thu 06/05/2024 at 2359, E-Rx      lidocaine  (LIDODERM ) 5 % Place 1 patch onto the skin in the morning. Remove & Discard patch within 12 hours or as directed by MD., Starting Sat 05/24/2024, E-Rx      predniSONE  (DELTASONE ) 20 MG tablet Take 2 tablets (40 mg) by mouth once daily for 5 days, Starting Sat 05/24/2024, Until Thu 05/29/2024, E-Rx           History of Present Illness      Nursing Triage note:      Chief complaint: Leg Pain    HPI:   History of Present Illness  Barbara Rubio is a 73 year old female who presents with acute onset thigh pain and difficulty walking.    She began experiencing pain in her thigh area on Wednesday, following her annual mammogram and bone density test on Tuesday. The pain is described as shooting down the thigh, particularly when she attempts to walk, resulting in a limp. She applied topical cream and took Advil , which only induced sleep without alleviating the pain.    Sitting relieves the pressure, but standing or walking exacerbates the pain, which radiates from the lateral thigh down to the knee. No back pain is reported, and she has not experienced any falls. She has not started any new exercise programs recently.    Her current medications include blood pressure medication, lupinesol, Evastorin for high cholesterol, and Mimetrix for incontinence. She also takes calcium  supplements but does not take any medication specifically for bone density.    She has a history of a wrist fracture in two spots before the  pandemic, for which she has an orthopedic doctor in [place].       Pertinent positives:    sudden pain in L hip, lateral thigh almost to L knee     Constitutional:  Denies fever or chills   Eyes:  Denies change in visual acuity or eye pain  HENT:  Denies sore throat, trouble swallowing   Respiratory:  Denies cough or shortness of breath or wheeze   Cardiovascular:  Denies chest pain or edema   GI:  Denies abdominal pain, nausea, vomiting, bloody stools or diarrhea  GU:  Denies dysuria, hematuria, frequency or trouble urinating   Musculoskeletal:  Denies other joint pain  Integument:  Denies rash  or wound  Neurologic:  Denies headache, focal weakness, numbness, tingling, speech, vision or gait changes  Psychiatric:  Denies suicidal or homicidal ideation.    c      Physical Exam     Pulse 75  BP (!) 143/91  Resp 18  SpO2 97 %  Temp 97 F (36.1 C)    Physical Exam     Pertinent Positives:    Painless ROM at foot, ankle, knee and skin wnl.    'mild pain with passive ROM internal rotation of L hip.    Nvi distally  No warmth     Constitutional:  Well developed, well nourished, no acute distress, not ill appearing   Eyes:   no discharge or redness  HENT:  Atraumatic, external ears normal, external nose/nares normal, oropharynx moist, no pharyngeal exudates. Neck- normal range of motion  Respiratory:  No respiratory distress, normal breath sounds, no rales, no wheezing, no rhonchi  Cardiovascular:  Normal rate, normal rhythm, no murmurs, no gallops, no rubs, normal radial pulses and dpp   GI:  Soft, nondistended, nontender, no rebound, no guarding    Musculoskeletal:  No edema, no other tenderness, no deformities.   Back- no tenderness  Integument:  Well hydrated, no rash   Neurologic:  Alert & oriented x 3, normal motor function, no focal deficits noted, coordination normal   Psychiatric:  Speech and behavior appropriate       Diagnosis management comments: I, Mabel Lynwood Favre, MD, have been the primary  provider for this patient during this Emergency Dept visit.    Oxygen saturation by pulse oximetry is greater than 94%, Normal, none needed.    When I was within 6 feet of this patient I donned the following PPE:  surgical mask y, Gloves Yes,     L hip xray visualized and interpreted independently by me as no fracture    Shared decision making process utilized for patient orders including imaging, labs and medications.    Pt feeling better , given instructions, glad to go home.    Patient Progress  Patient progress: stable     Medical Decision Making        Assessment & Plan  Right thigh pain with possible nerve impingement  Acute right thigh pain radiating from the lateral femoral area to the knee, likely due to nerve impingement or bursa impingement.  - Order X-rays of pelvis, hip, and femur to rule out bony lesions.  - Consider Lidoderm , muscle relaxer, and anti-inflammatory if X-rays are inconclusive.  - Advise follow-up with orthopedic specialist if symptoms persist.                     Procedures   Procedures        Supplemental Encounter Data   Medical History[1]  Past Surgical History[2]  Social History[3]  Family History[4]  Allergies[5]    Encounter Orders:  Orders Placed This Encounter   Procedures   . XR Hip left 2-3 vw with Pelvis   . Femur Left AP and Lateral     Medications Administered:  Medications   acetaminophen  (TYLENOL ) tablet 1,000 mg (1,000 mg Oral Given 05/24/24 1219)   ibuprofen  (ADVIL ) tablet 600 mg (600 mg Oral Given 05/24/24 1219)     Laboratory and Imaging Studies:     XR Hip left 2-3 vw with Pelvis   Final Result         Left hip and left femur:      1.No acute fracture or dislocation. Osteopenia limits sensitivity for   detection of acute fractures. If the patient is unable to bear weight, CT   is recommended for further evaluation.      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      Derick Daunt, MD   05/24/2024 1:16 PM      Femur Left AP and Lateral   Final Result         Left  hip and left femur:      1.No acute fracture or dislocation. Osteopenia limits sensitivity for  detection of acute fractures. If the patient is unable to bear weight, CT   is recommended for further evaluation.      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      Derick Daunt, MD   05/24/2024 1:16 PM                      [1]  Past Medical History:  Diagnosis Date   . Abnormal vision     wears glasses   . Constipation     HX - HAS BEEN MORE PREVELANT POST BARIATRIC SURGERY.   SABRA COVID-19 10/2021    HX - 21//23 URGENT CARE NOTE IN EPIC.   [facility]RA Fibroids     PT HAD A PARTIAL HYSTERECTOMY - OVARIES ARE STILL IN PLACE.   SABRA Gastroesophageal reflux disease 10/23/2013    HX.   SABRA Hypercholesterolemia 03/23/2010    High Bad Cholesterol Level   . Hyperlipidemia     MANAGED WITH MEDS.   SABRA Hypertension     PT STATES HE BP RUNS FROM 128-130/70s. MANAGED WITH MEDS - FOLLOWED BY PCP.   SABRA Left wrist fracture 2018    HX   . LUQ pain     HX   . Psoriatic arthritis (CMS/HCC)     HX - LOWER BACK, LEFT HAND, WRIST AND FINGERS   . Urinary incontinence 12/21/2017    HX OVERACTIVE BLADDER. ISSUE RESOLVED POST BARIATRIC SURGERY - PT STATED SHE LOST 65 LBS.   [2]  Past Surgical History:  Procedure Laterality Date   . BUNIONECTOMY Left    . COLONOSCOPY, DIAGNOSTIC (SCREENING)      X 2    . COLONOSCOPY, DIAGNOSTIC (SCREENING) N/A 04/13/2021    Procedure: COLONOSCOPY;  Surgeon: Ulysess Foots, MD;  Location: [id number];  Service: Gastroenterology;  Laterality: N/A;   . EGD N/A 05/18/2020    Procedure: EGD w/ bx's;  Surgeon: Marcey Sammi BIRCH, MD;  Location: [facility];  Service: Gastroenterology;  Laterality: N/A;   . EGD  12/2020   . EGD N/A 01/24/2022    Procedure: EGD w/ Bx's;  Surgeon: Marcey Sammi BIRCH, MD;  Location: [facility];  Service: Gastroenterology;  Laterality: N/A;   . HYSTERECTOMY      PARTIAL - OVARIES IN PLACE.   SABRA LAPAROSCOPIC GASTRECTOMY SLEEVE WITH HIATAL HERNIA N/A 07/28/2020    Procedure: LAPAROSCOPIC  GASTRECTOMY SLEEVE WITH HIATAL HERNIA, OMENTOPEXY, LAPAROSCOPIC RESECTION OF ANTRAL AND FUNDIC GASTRIC MASS;  Surgeon: Eliberto Oris SAUNDERS, DO;  Location: [facility];  Service: General;  Laterality: N/A;   [3]  Social History  Tobacco Use   . Smoking status: Never   . Smokeless tobacco: Never   . Tobacco comments:     Never smoked or used tobacco   Vaping Use   . Vaping status: Never Used   Substance Use Topics   . Alcohol use: Yes     Comment: 1-2 DRINKS MONTHLY   . Drug use: Never   [4]  Family History  Problem Relation Name Age of Onset   . Hypertension Mother Tommy Snell         Mother was in her 30s when she had high blood pressure. now deceased   . Arthritis Mother Tommy Snell         Had psoreactic arthristic   . Obesity Mother Tommy Snell         Mother was obese w.BMI over 38- 40   .  Hypertension Father Todd Shed         Father was in his 80s when diagonsis with high blood pressure. now deceased   . Arthritis Father Todd Shed         had psoreactic arthristic   . Hypertension Sister     . Hypertension Brother Lynwood Howell in his 98s with HB Pressure   . Hypertension Maternal Grandfather Drue Snell         Not sure of onset age but know he had High BP 23-86 yrs old   . Hypertension Sister Alfonse Caldron         Had high BPressure in her early 84s now deceased   . Migraines Neg Hx     . Stroke Neg Hx     . Seizures Neg Hx     [5]  No Known Allergies     Edmundo Mabel PARAS, MD  05/25/24 1734

## 2024-05-24 NOTE — Discharge Instructions (Addendum)
 YOU MAY SEE THE DOCTOR LISTED IF YOUR DOCTOR IS NOT AVAILABLE FOR FOLLOW UP IN THE TIME FRAME PROVIDED.  RETURN TO THE EMERGENCY DEPARTMENT IMMEDIATELY FOR ANY WORSENING OR CONCERNS.      Limit ambulation for 2 DAYS.

## 2024-05-24 NOTE — ED Triage Notes (Signed)
 Pt ambulating with an unsteady gait, refusing wheelchair at this time. Pt reports left thigh pain that radiates into knee that began 2 days ago. Pt denies any fall or injury. No visible bruising noted at this time.

## 2024-05-26 ENCOUNTER — Ambulatory Visit (INDEPENDENT_AMBULATORY_CARE_PROVIDER_SITE_OTHER): Payer: Self-pay | Admitting: Family

## 2024-05-27 ENCOUNTER — Encounter (INDEPENDENT_AMBULATORY_CARE_PROVIDER_SITE_OTHER): Payer: Self-pay | Admitting: Family

## 2024-05-27 NOTE — Progress Notes (Signed)
 Patient scheduled 09/2 with PCP

## 2024-05-29 ENCOUNTER — Other Ambulatory Visit (INDEPENDENT_AMBULATORY_CARE_PROVIDER_SITE_OTHER)

## 2024-05-29 DIAGNOSIS — I10 Essential (primary) hypertension: Secondary | ICD-10-CM

## 2024-05-29 DIAGNOSIS — E782 Mixed hyperlipidemia: Secondary | ICD-10-CM

## 2024-05-29 DIAGNOSIS — E559 Vitamin D deficiency, unspecified: Secondary | ICD-10-CM

## 2024-05-29 LAB — VITAMIN D, 25 OH, TOTAL: Vitamin D 25-OH, Total: 23 ng/mL — ABNORMAL LOW (ref 30–100)

## 2024-05-29 LAB — COMPREHENSIVE METABOLIC PANEL
ALT: 14 U/L (ref <=55)
AST (SGOT): 17 U/L (ref <=41)
Albumin/Globulin Ratio: 1.1 (ref 0.9–2.2)
Albumin: 3.9 g/dL (ref 3.5–5.0)
Alkaline Phosphatase: 94 U/L (ref 37–117)
Anion Gap: 10 (ref 5.0–15.0)
BUN: 19 mg/dL (ref 7–21)
Bilirubin, Total: 0.6 mg/dL (ref 0.2–1.2)
CO2: 27 meq/L (ref 17–29)
Calcium: 9.4 mg/dL (ref 7.9–10.2)
Chloride: 105 meq/L (ref 99–111)
Creatinine: 1.1 mg/dL — ABNORMAL HIGH (ref 0.4–1.0)
GFR: 53.1 mL/min/1.73 m2 — ABNORMAL LOW (ref >=60.0)
Globulin: 3.6 g/dL (ref 2.0–3.6)
Glucose: 100 mg/dL (ref 70–100)
Hemolysis Index: 7 {index}
Potassium: 4.8 meq/L (ref 3.5–5.3)
Protein, Total: 7.5 g/dL (ref 6.0–8.3)
Sodium: 142 meq/L (ref 135–145)

## 2024-05-29 LAB — LAB USE ONLY - CBC WITH DIFFERENTIAL
Absolute Basophils: 0.02 x10 3/uL (ref 0.00–0.08)
Absolute Eosinophils: 0 x10 3/uL (ref 0.00–0.44)
Absolute Immature Granulocytes: 0.01 x10 3/uL (ref 0.00–0.07)
Absolute Lymphocytes: 1.02 x10 3/uL (ref 0.42–3.22)
Absolute Monocytes: 0.27 x10 3/uL (ref 0.21–0.85)
Absolute Neutrophils: 3.8 x10 3/uL (ref 1.10–6.33)
Absolute nRBC: 0 x10 3/uL (ref <=0.00)
Basophils %: 0.4 %
Eosinophils %: 0 %
Hematocrit: 43.7 % (ref 34.7–43.7)
Hemoglobin: 13.6 g/dL (ref 11.4–14.8)
Immature Granulocytes %: 0.2 %
Lymphocytes %: 19.9 %
MCH: 27.3 pg (ref 25.1–33.5)
MCHC: 31.1 g/dL — ABNORMAL LOW (ref 31.5–35.8)
MCV: 87.8 fL (ref 78.0–96.0)
MPV: 12.6 fL — ABNORMAL HIGH (ref 8.9–12.5)
Monocytes %: 5.3 %
Neutrophils %: 74.2 %
Platelet Count: 165 x10 3/uL (ref 142–346)
Preliminary Absolute Neutrophil Count: 3.8 x10 3/uL (ref 1.10–6.33)
RBC: 4.98 x10 6/uL (ref 3.90–5.10)
RDW: 15 % (ref 11–15)
WBC: 5.12 x10 3/uL (ref 3.10–9.50)
nRBC %: 0 /100{WBCs} (ref <=0.0)

## 2024-05-29 LAB — THYROID STIMULATING HORMONE (TSH) WITH REFLEX TO FREE T4: TSH: 1.29 u[IU]/mL (ref 0.35–4.94)

## 2024-05-29 LAB — URINE MICROALBUMIN, RANDOM
Urine Creatinine: 318 mg/dL
Urine Microalbumin/Creatinine Ratio: 15 ug/mg (ref <30)
Urine Microalbumin: 49 ug/mL — ABNORMAL HIGH (ref 0.0–30.0)

## 2024-05-29 LAB — LIPID PANEL
Cholesterol / HDL Ratio: 3.6 {index}
Cholesterol: 278 mg/dL — ABNORMAL HIGH (ref <=199)
HDL: 78 mg/dL (ref >=40)
LDL Calculated: 184 mg/dL — ABNORMAL HIGH (ref 0–99)
Triglycerides: 78 mg/dL (ref 34–149)
VLDL Calculated: 16 mg/dL (ref 10–40)

## 2024-05-30 ENCOUNTER — Encounter (INDEPENDENT_AMBULATORY_CARE_PROVIDER_SITE_OTHER): Payer: Self-pay

## 2024-05-30 ENCOUNTER — Other Ambulatory Visit (INDEPENDENT_AMBULATORY_CARE_PROVIDER_SITE_OTHER)

## 2024-05-30 ENCOUNTER — Ambulatory Visit (INDEPENDENT_AMBULATORY_CARE_PROVIDER_SITE_OTHER): Payer: Self-pay | Admitting: Family

## 2024-05-30 ENCOUNTER — Other Ambulatory Visit (INDEPENDENT_AMBULATORY_CARE_PROVIDER_SITE_OTHER): Payer: Self-pay | Admitting: Family

## 2024-05-30 ENCOUNTER — Telehealth (INDEPENDENT_AMBULATORY_CARE_PROVIDER_SITE_OTHER): Payer: Self-pay | Admitting: Family

## 2024-05-30 ENCOUNTER — Ambulatory Visit (INDEPENDENT_AMBULATORY_CARE_PROVIDER_SITE_OTHER): Admitting: Student in an Organized Health Care Education/Training Program

## 2024-05-30 DIAGNOSIS — N179 Acute kidney failure, unspecified: Secondary | ICD-10-CM

## 2024-05-30 MED ORDER — ATORVASTATIN CALCIUM 40 MG PO TABS
40.0000 mg | ORAL_TABLET | Freq: Every day | ORAL | 1 refills | Status: DC
Start: 2024-05-30 — End: 2024-08-18

## 2024-05-30 MED ORDER — VITAMIN D (ERGOCALCIFEROL) 1.25 MG (50000 UT) PO CAPS
50000.0000 [IU] | ORAL_CAPSULE | ORAL | 1 refills | Status: DC
Start: 2024-05-30 — End: 2024-08-08

## 2024-05-30 NOTE — Telephone Encounter (Signed)
 Called [employer] [employer] to complete appeal for Wegovy  and spoke to Pharmacist Ozell. He stated that patient meets 2/3 requirements for GLP-1 to be covered (Obesity and Cardiovascular risk). Patient does not meet requirement of atherosclerotic heart disease (ICD 10 code I25.10). He will fax over another form to be completed if deemed necessary and provided case number for our reference [facility] [place]).

## 2024-06-03 ENCOUNTER — Other Ambulatory Visit (INDEPENDENT_AMBULATORY_CARE_PROVIDER_SITE_OTHER): Payer: Self-pay | Admitting: Family

## 2024-06-03 ENCOUNTER — Encounter (INDEPENDENT_AMBULATORY_CARE_PROVIDER_SITE_OTHER): Payer: Self-pay | Admitting: Cardiology

## 2024-06-03 ENCOUNTER — Encounter (INDEPENDENT_AMBULATORY_CARE_PROVIDER_SITE_OTHER): Payer: Self-pay | Admitting: Family

## 2024-06-03 DIAGNOSIS — E782 Mixed hyperlipidemia: Secondary | ICD-10-CM

## 2024-06-03 DIAGNOSIS — I10 Essential (primary) hypertension: Secondary | ICD-10-CM

## 2024-06-12 NOTE — Progress Notes (Signed)
 [facility] [place] [facility] CONSULTATION    CHIEF COMPLAINT:   Chief Complaint   Patient presents with    Mixed Hyperlipidemia     Referred by PCP for Mixed hyperlipidemia  Essential hypertension  Obesity, morbid  No cardiac sx. 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    She has a history of hypertension and reports taking lisinopril  10 mg daily for several years. Her blood pressure was previously well-controlled at around 120/80 mmHg but has recently increased, which she attributes to stress and pain from arthritis. She has been to multiple doctors recently, including an orthopedic specialist, and received a steroid injection for hip and knee pain, which provided some relief.    She reports a recent onset of pain radiating from her hip to her knee, which led to an emergency room visit. The pain was described as shooting and persistent for several hours. She received a steroid injection from an orthopedic doctor, which helped alleviate the pain. However, she continues to experience knee pain and is on a tapering dose of oral steroids.    She has a history of hyperlipidemia and is currently on atorvastatin  40 mg, which she started about a month and a half ago. She is concerned about her LDL levels, which she reports are high. Patient denies chest pain, lower extremity edema, shortness of breath, palpitations, dizziness, or syncope.  She has a family history of heart disease, with her father and grandmother having died from an enlarged heart.    She underwent bariatric surgery three to four years ago, resulting in significant weight loss from 253 lbs to  175 lbs with weight gain to her current 222lbs.  She is concerned about regaining weight and wants to lose more weight.    REVIEW OF SYSTEMS: All other systems reviewed and negative except as above.     PAST MEDICAL HISTORY: Medical History[1]She has a past medical history of Abnormal vision, Arthritis, Constipation, COVID-19 (10/2021), Fibroids, Gastroesophageal reflux disease (10/23/2013), Hypercholesterolemia (03/23/2010), Hyperlipidemia, Hypertension, Left wrist fracture (2018), LUQ pain, Psoriatic arthritis (CMS/HCC), and Urinary incontinence (12/21/2017). She has a past surgical history that includes Hysterectomy; COLONOSCOPY, DIAGNOSTIC (SCREENING); Bunionectomy (Left); EGD (N/A, 05/18/2020); LAPAROSCOPIC GASTRECTOMY SLEEVE WITH HIA (N/A, 07/28/2020); EGD (12/2020); COLONOSCOPY, DIAGNOSTIC (SCREENING) (N/A, 04/13/2021); and EGD (N/A, 01/24/2022).    SOCIAL HISTORY:  reports that she has never smoked. She has never used smokeless tobacco. She reports current alcohol use. She reports that she does not use drugs.    FAMILY HISTORY: Family History[2]    MEDICATIONS: Current Medications[3]     PHYSICAL EXAMINATION  Health Related Quality of Life: Good   Vital Signs: BP (!) 160/93 (BP Site: Left arm, Patient Position: Sitting, Cuff Size: Medium)   Pulse 78   Ht 1.702 m (5' 7)   Wt 100.9 kg (222 lb 6.4 oz)   SpO2 95%   BMI 34.83 kg/m    General: Well nourished African American female AOx3 no acute distress  Chest: Clear to auscultation bilaterally, no wheeze, crackles, or rales. Normal respiratory effort.  Cardiovascular: S1 S2 RRR No murmurs, rubs or gallops.  Abdomen: Soft, nontender. No pulsatile masses or bruits.    Extremities: Able to move all extremities without difficulty. Warm without edema. Peripheral pulses are full and equal.  Skin: No evidence of rash. Good skin turgor. No bruising/ecchymoses.  Neuro: Cranial Nvs II-XII grossly intact, abnormal gait, limping with use of cane, no acute  deficits  Psych: Normal Affect. No evidence of depression. No suicidal ideation.     ECG: 06/13/2024 normal sinus rhythm at 76 bpm, normal axis and intervals, no significant ST/T changes, normal EKG.     LABS:   Lab Results   Component Value Date    WBC 5.12 05/29/2024    HGB 13.6 05/29/2024    HCT 43.7 05/29/2024    PLT 165 05/29/2024    NA 142 05/29/2024    K 4.8 05/29/2024    MG 1.8 07/29/2020    BUN 19 05/29/2024    CREAT 1.1 (H) 05/29/2024    GLU 100 05/29/2024    CHOL 278 (H) 05/29/2024    TRIG 78 05/29/2024    HDL 78 05/29/2024    LDL 184 (H) 05/29/2024    AST 17 05/29/2024    ALT 14 05/29/2024    HGBA1C 5.5 06/09/2020    TSH 1.29 05/29/2024        ECHOCARDIOGRAM: Pending    STRESS TEST:     MONITOR:    IMPRESSION/RECOMMENDATIONS:   Hypertension  Long-standing hypertension with suspected mild chronic kidney disease due to fluctuating kidney function and elevated creatinine. Lisinopril  may not adequately control blood pressure.  - Stop lisinopril .  - Start carvedilol  6.25 mg twice daily with food.  - Monitor blood pressure daily at varying times for two weeks.  - Skip carvedilol  if systolic BP <100 mmHg or HR <55 bpm.  - Order echocardiogram   - Follow up in four weeks for BP check.    Hyperlipidemia  Hyperlipidemia with elevated LDL, possible genetic predisposition. On atorvastatin  40 mg.  - Recheck lipid labs in two months along with lipoprotein (a) and apolipoprotein A&B  -ASCVD 10-year risk score 29.4%, which is considered high risk  -Patient just started atorvastatin  40mg , should continue.  -Discussed further life style modification such as reducing saturated fats (reduction in fatty meats and full fat dairy, elimination of trans fats, increasing foods rich in Omega 3 fatty acids (salmon, mackerel, walnuts, flaxseeds), increasing soluble fiber. In addition exercising most days of the week for at least 30 minutes (brisk walking, riding bike, playing a sport. Losing weight as well as drinking in  moderation.    Obesity BMI: 34.8kg/m2  -S/p Roux-en Y gastric bypass, with weight regain.  -Would like to explore use of GLP-1 agonist  -Will refer to Cardio-metabolic clinic with Jimmy Dingess    F/U in 3 months.    Thank you for involving me in Ms. Nonaka's care. A copy of this note will be sent to the referring provider.       Milo JINNY Babe, MD MPH [facility]    [facility] [place]  [phone]  [address]  [place], [place] [address]  Phone - [phone]  Fax - [phone]          [1]   Past Medical History:  Diagnosis Date    Abnormal vision     wears glasses    Arthritis     Constipation     HX - HAS BEEN MORE PREVELANT POST BARIATRIC SURGERY.    COVID-19 10/2021    HX -  21//23 URGENT CARE NOTE IN EPIC.    Fibroids     PT HAD A PARTIAL HYSTERECTOMY - OVARIES ARE STILL IN PLACE.    Gastroesophageal reflux disease 10/23/2013    HX.    Hypercholesterolemia 03/23/2010    High Bad Cholesterol Level    Hyperlipidemia     MANAGED WITH MEDS.    Hypertension     PT STATES HE BP RUNS FROM 128-130/70s. MANAGED WITH MEDS - FOLLOWED BY PCP.    Left wrist fracture 2018    HX    LUQ pain     HX    Psoriatic arthritis (CMS/HCC)     HX - LOWER BACK, LEFT HAND, WRIST AND FINGERS    Urinary incontinence 12/21/2017    HX OVERACTIVE BLADDER. ISSUE RESOLVED POST BARIATRIC SURGERY - PT STATED SHE LOST 65 LBS.   [2]   Family History  Problem Relation Name Age of Onset    Hypertension Mother Tommy Snell         Mother was in her 30s when she had high blood pressure. now deceased    Arthritis Mother Tommy Snell         Had psoreactic arthristic    Obesity Mother Tommy Snell         Mother was obese w.BMI over 38- 40    Hypertension Father Todd Shed         Father was in his 76s when diagonsis with high blood pressure. now deceased    Arthritis Father Todd Shed         had psoreactic arthristic    Heart disease Father Todd Shed     Hypertension Sister      Hypertension Sister Alfonse Caldron         Had high BPressure in  her early 80s now deceased    Hypertension Brother Lynwood Howell in his 69s with HB Pressure    Hypertension Maternal Grandfather Drue Snell         Not sure of onset age but know he had High BP 60-86 yrs old    Heart disease Paternal Grandmother      Migraines Neg Hx      Stroke Neg Hx      Seizures Neg Hx     [3]   Current Outpatient Medications:     atorvastatin  (LIPITOR) 40 MG tablet, Take 1 tablet (40 mg) by mouth once daily, Disp: 90 tablet, Rfl: 1    lidocaine  (LIDODERM ) 5 %, Place 1 patch onto the skin in the morning. Remove & Discard patch within 12 hours or as directed by MD. (Patient taking differently: Place 1 patch onto the skin in the morning. Remove & Discard patch within 12 hours or as directed by MD As needed.), Disp: 9 patch, Rfl: 0    lisinopril  (ZESTRIL ) 10 MG tablet, Take 1 tablet (10 mg) by mouth once daily, Disp: 90 tablet, Rfl: 1    methylPREDNISolone (MEDROL DOSEPAK) 4 MG tablet, Take 1 tablet (4 mg) by mouth 21 tablest for 5 days. Take 1day 5 tab, 2 , 4 tabs, 3(tabs),  4 (2tabs), 5 (1tab), Disp: , Rfl:     Multiple Vitamin (multivitamin) capsule, Take 1 capsule by mouth daily, Disp: , Rfl:     Myrbetriq 50 MG Tablet SR 24 hr, Take 1 tablet (50 mg) by mouth once daily, Disp: , Rfl:     vitamin A  3 MG (10000 UT) capsule, Take  1 capsule (10,000 Units) by mouth daily, Disp: 20 capsule, Rfl: 0    vitamin D , ergocalciferol , (DRISDOL ) 50000 UNIT Cap, Take 1 capsule (50,000 Units) by mouth once a week, Disp: 12 capsule, Rfl: 1    Diclofenac  Sodium CR 100 MG 24 hr tablet, Take by mouth once daily (Patient not taking: Reported on 06/13/2024), Disp: , Rfl:     estradiol  (ESTRACE ) 0.1 MG/GM vaginal cream, Place 1 g vaginally twice a week TWICE A WEEK - USES DOSE MONDAYS AND WEDNESDAYS (Patient not taking: Reported on 06/13/2024), Disp: 42.5 g, Rfl: 0    phentermine  15 MG capsule, Take 1 capsule (15 mg) by mouth once every morning X 7 days then increase to 37.5 mg daily (separate  prescription) (Patient not taking: Reported on 06/13/2024), Disp: 7 capsule, Rfl: 0    phentermine  37.5 MG capsule, Take 1 capsule (37.5 mg) by mouth once every morning (Patient not taking: Reported on 06/13/2024), Disp: 30 capsule, Rfl: 1    vitamin D  (CHOLECALCIFEROL) 25 MCG (1000 UT) tablet, Take 5 tablets (5,000 Units) by mouth daily (Patient not taking: Reported on 06/13/2024), Disp: , Rfl:

## 2024-06-13 ENCOUNTER — Encounter (INDEPENDENT_AMBULATORY_CARE_PROVIDER_SITE_OTHER): Payer: Self-pay | Admitting: Cardiology

## 2024-06-13 ENCOUNTER — Ambulatory Visit (INDEPENDENT_AMBULATORY_CARE_PROVIDER_SITE_OTHER): Admitting: Cardiology

## 2024-06-13 VITALS — BP 160/93 | HR 78 | Ht 67.0 in | Wt 222.4 lb

## 2024-06-13 DIAGNOSIS — I10 Essential (primary) hypertension: Secondary | ICD-10-CM

## 2024-06-13 DIAGNOSIS — E782 Mixed hyperlipidemia: Secondary | ICD-10-CM

## 2024-06-13 LAB — ECG 12-LEAD
Atrial Rate: 76 {beats}/min
IHS MUSE NARRATIVE AND IMPRESSION: NORMAL
P Axis: 51 degrees
P-R Interval: 188 ms
Q-T Interval: 406 ms
QRS Duration: 88 ms
QTC Calculation (Bezet): 456 ms
R Axis: -7 degrees
T Axis: 27 degrees
Ventricular Rate: 76 {beats}/min

## 2024-06-13 MED ORDER — CARVEDILOL 6.25 MG PO TABS
6.2500 mg | ORAL_TABLET | Freq: Two times a day (BID) | ORAL | 1 refills | Status: DC
Start: 2024-06-13 — End: 2024-08-18

## 2024-06-13 NOTE — Patient Instructions (Addendum)
 Patient to keep BP log for next 2 weeks. To check daily at different time of day each day.     START carvedilol  6.25mg  twice a day with food    If top number of blood pressure less than to skip blood pressure medicine dose  If heart rate less than 55 bpm to skip blood pressure medicine dose    STOP Lisinopril     Recheck lipid panel in 2 months    Patient to schedule echo    Follow up with Dr. Evetta in 3 months  Blood pressure check in 4 weeks    Referral to [facility] in  [place] for [employer]

## 2024-06-16 ENCOUNTER — Other Ambulatory Visit (INDEPENDENT_AMBULATORY_CARE_PROVIDER_SITE_OTHER)

## 2024-06-16 DIAGNOSIS — E782 Mixed hyperlipidemia: Secondary | ICD-10-CM

## 2024-06-16 DIAGNOSIS — I10 Essential (primary) hypertension: Secondary | ICD-10-CM

## 2024-06-18 LAB — ECHO ADULT TTE COMPLETE
AV Area (Cont Eq VTI): 2.7575
AV Mean Gradient: 5
AV Peak Velocity: 1.48
Ao Root Diameter (2D): 3.4
BP Mod LV Ejection Fraction: 65
IVS Diastolic Thickness (2D): 0.9
LA Dimension (2D): 3.1
LA Volume Index (BP A-L): 20.6007
LVID diastole (2D): 4
LVID systole (2D): 2.5
MV E/A: 0.5216
MV E/e' (Average): 9.9376
Mitral Valve Findings: NORMAL
Prox Ascending Aorta Diameter: 3.2
Pulmonary Valve Findings: NORMAL
RV Basal Diastolic Dimension: 2.7
RV Systolic Pressure: 24.7156
TAPSE: 1.92
Tricuspid Valve Findings: NORMAL

## 2024-06-21 ENCOUNTER — Ambulatory Visit
Admit: 2024-06-21 | Discharge: 2024-06-21 | Disposition: A | Discharge status: Home / Self Care | Source: Ambulatory Visit | Attending: Family Nurse Practitioner | Admitting: Family Nurse Practitioner

## 2024-06-21 ENCOUNTER — Encounter (INDEPENDENT_AMBULATORY_CARE_PROVIDER_SITE_OTHER): Payer: Self-pay

## 2024-06-21 ENCOUNTER — Ambulatory Visit (INDEPENDENT_AMBULATORY_CARE_PROVIDER_SITE_OTHER): Admitting: Family Nurse Practitioner

## 2024-06-21 VITALS — BP 125/77 | HR 80 | Temp 98.0°F | Resp 12 | Ht 67.0 in | Wt 222.0 lb

## 2024-06-21 DIAGNOSIS — Z5321 Procedure and treatment not carried out due to patient leaving prior to being seen by health care provider: Secondary | ICD-10-CM | POA: Insufficient documentation

## 2024-06-21 DIAGNOSIS — J208 Acute bronchitis due to other specified organisms: Secondary | ICD-10-CM

## 2024-06-21 DIAGNOSIS — B9689 Other specified bacterial agents as the cause of diseases classified elsewhere: Secondary | ICD-10-CM

## 2024-06-21 DIAGNOSIS — R051 Acute cough: Secondary | ICD-10-CM

## 2024-06-21 DIAGNOSIS — J209 Acute bronchitis, unspecified: Secondary | ICD-10-CM

## 2024-06-21 LAB — ABBOTT BINAX NOW COVID-19 ANTIGEN: BinaxNOW SARS COV2 Antigen POCT: NEGATIVE

## 2024-06-21 LAB — POCT INFLUENZA A/B
POCT Rapid Influenza A AG: NEGATIVE
POCT Rapid Influenza B AG: NEGATIVE

## 2024-06-21 LAB — STREP A POCT GOHEALTH: Rapid Strep A Screen POCT: NEGATIVE

## 2024-06-21 MED ORDER — BENZONATATE 100 MG PO CAPS
100.0000 mg | ORAL_CAPSULE | Freq: Three times a day (TID) | ORAL | 0 refills | Status: AC | PRN
Start: 2024-06-21 — End: 2024-07-21

## 2024-06-21 MED ORDER — AZITHROMYCIN 250 MG PO TABS
ORAL_TABLET | ORAL | 0 refills | Status: AC
Start: 2024-06-21 — End: 2024-06-26

## 2024-06-21 NOTE — Patient Instructions (Signed)
 Thank you for choosing [facility]  Get plenty of rest  Please get the x-ray done  Drinking plenty of warm liquids, warm chicken soup, honey, green tea etc  Avoid irritants that might affect your throat, such as smoke from cigarettes, cigars, or pipes, and cold air  Use a humidifier by your bedside at night  Cover your nose and mouth while coughing   Robutussin expectorant daytime/Robutussin DM night time as directed on bottle or Dayquil/Nyquil as directed on bottle or Mucinex  to liquify secretions   Tessaolon Pearles oral : swallow whole capsule( do not break, chew, dissolve, cut, or crush )          100-200 mg TID as needed for cough ( max single dose: 600 mg/day)  Steam Inhalation : 2-3 times a day can put Vicks VapoRub in it.      Please follow up with your primary care doctor in 3-4 days. It is important to receive annual screenings and routine preventive care.   Return to the clinic or Emergency Department for worsening symptoms.    Please remember to wash your hands it is the best way to reduce illness and the spread of germs.       Xray today at [facility]: [address], [place], [place] [address]     You do not need to be seen in the ER, you just need to check in at the front desk

## 2024-06-21 NOTE — Progress Notes (Signed)
 [facility]  OFFICE NOTE         Subjective   Historian: Patient      Chief Complaint   Patient presents with    Cough     6 days hx of cough and productive in nature and dark green in nature.      HPI  Arron is a 73 y.o. female who presents c/o productive cough with green mucus , body aches, and fatigue feeling  for 6 days. She denies chest pain/tightness/pressure, dyspnea, chills, arthralgia, headache, dizziness, and/or abd/back pain. She reports she took Coricidin and mucinex  with some relief. She reports she has been on 08/02 related to hip pain and was given steroid which she stopped thinking that it has affected her BP. Reports hip pain better than before.           History:  Medications and Allergies reviewed.   Pertinent Past Medical, Surgical, Family and Social History were reviewed.          Objective     Vitals:    06/21/24 1229   BP: 125/77   BP Site: Left arm   Patient Position: Sitting   Cuff Size: Large   Pulse: 80   Resp: 12   Temp: 98 F (36.7 C)   TempSrc: Oral   SpO2: 97%   Weight: 100.7 kg (222 lb)   Height: 1.702 m (5' 7)     Physical Exam  Constitutional:       General: She is not in acute distress.     Appearance: She is well-developed.   HENT:      Head: Normocephalic and atraumatic.     Eyes: Conjunctivae are normal. Cardiovascular:      Rate and Rhythm: Normal rate and regular rhythm.   Pulmonary:      Effort: Pulmonary effort is normal.      Breath sounds: Normal breath sounds.   Neurological:      Mental Status: She is alert and oriented to person, place, and time.   Skin:     General: Skin is warm.   Vitals and nursing note reviewed.     Urgent Care Course   LABS  The following POCT tests were ordered, reviewed and discussed with the patient/family.     Results for orders placed or performed in visit on 06/21/24 (from the past 24 hours)   POCT Influenza A/B    Collection Time: 06/21/24  1:20 PM   Result Value    POCT QC Pass    POCT Rapid Influenza A AG Negative     POCT Rapid Influenza B AG Negative   Rapid Strep A POCT    Collection Time: 06/21/24  1:20 PM   Result Value    POCT QC Pass    Rapid Strep A Screen POCT Negative   BinaxNow SARS-COV-2 Antigen POCT    Collection Time: 06/21/24  1:20 PM   Result Value    BinaxNOW SARS COV2 Antigen POCT Negative     There were no x-rays reviewed with this patient during the visit.      Procedures   Procedures     Assessment / Plan     Differential Diagnoses including but not limited to: Sinusitis, Bronchitis, Pharyngitis, Pneumonia, Influenza, COVID-19 and Allergic Rhinitis     Please look under wrap up section for plan of care. X ray result pending.     Annalyssa was seen today for cough.    Diagnoses and all orders  for this visit:    Acute bacterial bronchitis    Acute bronchitis, unspecified organism  -     POCT Influenza A/B; Future  -     Rapid Strep A POCT; Future  -     BinaxNow SARS-COV-2 Antigen POCT; Future  -     POCT Influenza A/B  -     Rapid Strep A POCT  -     BinaxNow SARS-COV-2 Antigen POCT  -     XR Chest 2 Views; Future    Other orders  -     benzonatate  (TESSALON ) 100 MG capsule; Take 1 capsule (100 mg) by mouth 3 (three) times daily as needed for Cough  -     azithromycin  (ZITHROMAX ) 250 MG tablet; Take 2 tabs daily on day 1, then 1 tablet daily for 4 days.         The indications for early follow-up with PCP and return to UC were discussed. Patient/family received education on the working diagnosis, diagnostic uncertainties, and proposed treatment plan. Indications for emergency evaluation in the ED were reviewed. Written and verbal discharge instructions were provided and discussed and all questions from the patient/family were addressed, with no apparent barriers.

## 2024-06-24 ENCOUNTER — Ambulatory Visit (INDEPENDENT_AMBULATORY_CARE_PROVIDER_SITE_OTHER): Payer: Self-pay | Admitting: Cardiology

## 2024-06-24 ENCOUNTER — Ambulatory Visit (INDEPENDENT_AMBULATORY_CARE_PROVIDER_SITE_OTHER): Admitting: Family

## 2024-06-24 ENCOUNTER — Other Ambulatory Visit (INDEPENDENT_AMBULATORY_CARE_PROVIDER_SITE_OTHER): Payer: Self-pay | Admitting: Family

## 2024-06-24 DIAGNOSIS — Z23 Encounter for immunization: Secondary | ICD-10-CM

## 2024-06-30 ENCOUNTER — Encounter (INDEPENDENT_AMBULATORY_CARE_PROVIDER_SITE_OTHER): Payer: Self-pay

## 2024-07-02 ENCOUNTER — Ambulatory Visit
Admission: RE | Admit: 2024-07-02 | Discharge: 2024-07-02 | Disposition: A | Discharge status: Home / Self Care | Source: Ambulatory Visit

## 2024-07-08 ENCOUNTER — Ambulatory Visit (INDEPENDENT_AMBULATORY_CARE_PROVIDER_SITE_OTHER): Admitting: Student in an Organized Health Care Education/Training Program

## 2024-07-08 ENCOUNTER — Encounter (INDEPENDENT_AMBULATORY_CARE_PROVIDER_SITE_OTHER): Payer: Self-pay | Admitting: Student in an Organized Health Care Education/Training Program

## 2024-07-08 VITALS — Ht 67.0 in | Wt 220.0 lb

## 2024-07-08 DIAGNOSIS — M79605 Pain in left leg: Secondary | ICD-10-CM

## 2024-07-08 MED ORDER — GABAPENTIN 100 MG PO CAPS
100.0000 mg | ORAL_CAPSULE | Freq: Every evening | ORAL | 0 refills | Status: DC
Start: 2024-07-08 — End: 2024-08-08

## 2024-07-08 NOTE — Progress Notes (Signed)
 Orthopedic Surgery New Patient Clinic Visit    Chief Complaint     Chief Complaint   Patient presents with    Hip Problem     73 y/o female presents for evaluation of the left hip        History of Present Illness:     Barbara Rubio is a 73 y.o. female who presents today for evaluation of left leg pain. Her symptoms have been present for approximately 6 weeks and have been worsening over time.  She was seen regarding this issue at the [facility] ED on 05/24/2024 where x-rays were obtained and noted to be negative for any acute fractures.  She was discharged with a prescription of Flexeril  and Medrol Dosepak.  She notes hip pain that is primarily localized to the lower back. The pain is constant in nature. The patient also endorses pain that radiates down her left leg all the way to her ankle and associated numbness/paresthesias. She denies groin pain at this time. No recent injury/trauma. The pain temporarily improves with OTC pain relievers and is exacerbated by walking, standing, and resting at nighttime.  She was seen regarding this issue by 2 external providers.  The first provider gave her a cortisone injection for her left hip which did not help her at all.  The second provider ordered an MRI of her lumbar spine and recommended an injection for her lower back.  She presents fo a third opinion today and to further discuss treatment options.     Clinical interventions thus far:  HEP: no   PT: no   Medications: yes --  Acetaminophen    Injections: yes --left hip intra-articular cortisone injection on 06/02/2024 --no improvement  Ambulatory aid: Cane    Past Hip or Knee surgery: None    Medical Hx: Hypertension, hyperlipidemia.  Opioid Use: no current use  Smoking Status: Non-smoker    Past Medical History:   Past Medical History:   Diagnosis Date    Abnormal vision     wears glasses    Arthritis     Constipation     HX - HAS BEEN MORE PREVELANT POST BARIATRIC SURGERY.    COVID-19 10/2021    HX - 21//23  URGENT CARE NOTE IN EPIC.    Fibroids     PT HAD A PARTIAL HYSTERECTOMY - OVARIES ARE STILL IN PLACE.    Gastroesophageal reflux disease 10/23/2013    HX.    Hypercholesterolemia 03/23/2010    High Bad Cholesterol Level    Hyperlipidemia     MANAGED WITH MEDS.    Hypertension     PT STATES HE BP RUNS FROM 128-130/70s. MANAGED WITH MEDS - FOLLOWED BY PCP.    Left wrist fracture 2018    HX    LUQ pain     HX    Psoriatic arthritis (CMS/HCC)     HX - LOWER BACK, LEFT HAND, WRIST AND FINGERS    Urinary incontinence 12/21/2017    HX OVERACTIVE BLADDER. ISSUE RESOLVED POST BARIATRIC SURGERY - PT STATED SHE LOST 65 LBS.       Past Surgical History:   Past Surgical History[1]    Social History:   Social History[2]     Current Medications:  Current Medications[3]    Allergies:   Allergies[4]     Physical Examination:     Vitals (last filed):   There were no vitals taken for this visit.    BMI:   Estimated body mass index is  34.77 kg/m as calculated from the following:    Height as of 06/21/24: 1.702 m (5' 7).    Weight as of 06/21/24: 100.7 kg (222 lb).     Exam:  General: NAD. Patient is alert and oriented. The patient is in no apparent distress.  CV: Extremity wwp  Resp: Nonlabored breathing, breathing comfortably on RA    Examination of the Left Hip:  No previous incisions  TTP none  No Irritability with log roll  Hip Flexion to 9=80  Hip ER 30  Hip IR 15  Neg Stinchfield test but weakness with straight leg raise  Patient is otherwise NVI    Laboratory / Ancillary Data:   Data Review:  Lab Results   Component Value Date    WBC 5.12 05/29/2024    HGB 13.6 05/29/2024    HCT 43.7 05/29/2024    MCV 87.8 05/29/2024    PLT 165 05/29/2024     Lab Results   Component Value Date    NA 142 05/29/2024    K 4.8 05/29/2024    CO2 27 05/29/2024    CL 105 05/29/2024    BUN 19 05/29/2024     Lab Results   Component Value Date    HGBA1C 5.5 06/09/2020       Imaging:   Previous Xrays were reviewed.  AP pelvis and left hip Xray demonstrate  moderate joint space narrowing associated with osteophyte formation. No other acute pathology noted. Impression: Moderate arthritis of left hip.      Procedures:   None    Assessment / Plan:     Left lumbar radiculopathy  -We discussed the radiating pain that she is experiencing in her legs all the way down to her ankle is more consistent with lumbar radiculopathy than intra-articular hip pathology  -We discussed the diagnosis of lumbar radiculopathy and the natural history as well as treatment modalities including weight loss, physical therapy, NSAID medication (both over the counter and prescribed), injection therapies, and ultimately possible surgical options with our spine colleagues.    -I have prescribed outpatient PT to focus on lower back, core, gait, balance, hip musculature stretching/strengthening, IT band stretching  -I will help her make an appointment with our spine team for further evaluation and discussion of management options including injection therapy for her lower back  - NSAIDs and Tylenol  for pain relief PRN  - Gabapentin  prescription sent to the pharmacy following a discussion of risks and benefits        Left Hip Arthritis   -We discussed the diagnosis of hip osteoarthritis at length. We discussed the natural history and treatment modalities including weight loss, physical therapy, NSAID medication (both over the counter and prescribed), injection therapies, and ultimately possible surgical options.  -At this time she does not have any groin pain or irritability with hip range of motion or provocative maneuvers.   -We discussed that if in the future her left hip arthritis became symptomatic, we will further discuss management options including surgical intervention with a left THA.  She voiced understanding.    -Follow-up: As needed for worsening left hip pain    -All questions and concerns were answered in the office today and they are to call the office in the interim if they have any  questions.     ---------------------------------------------------------------------------------------------------------------  The review of the patient's medications does not in any way constitute an endorsement, by this clinician, of their use, dosage, indications, route, efficacy, interactions, or other clinical  parameters.    Please pardon any potential grammatical errors or typos as aspects of this note may have been created through speech-to-text software.      Attestation:     I have seen and personally examined the patient, reviewed and personally interpreted the images pertinent for treatment decision, and am the responsible party for the plan and clinical decisions.  One or more parties may have aided in the documentation of this patient, and I have personally reviewed this work and attest it to be true to the best of my knowledge.    Signed:  Camie Blanch, MD  10:18 AM          [1]   Past Surgical History:  Procedure Laterality Date    BUNIONECTOMY Left     COLONOSCOPY, DIAGNOSTIC (SCREENING)      X 2     COLONOSCOPY, DIAGNOSTIC (SCREENING) N/A 04/13/2021    Procedure: COLONOSCOPY;  Surgeon: Ulysess Foots, MD;  Location: [id number];  Service: Gastroenterology;  Laterality: N/A;    EGD N/A 05/18/2020    Procedure: EGD w/ bx's;  Surgeon: Marcey Sammi BIRCH, MD;  Location: [place] [facility];  Service: Gastroenterology;  Laterality: N/A;    EGD  12/2020    EGD N/A 01/24/2022    Procedure: EGD w/ Bx's;  Surgeon: Marcey Sammi BIRCH, MD;  Location: [place] [facility];  Service: Gastroenterology;  Laterality: N/A;    HYSTERECTOMY      PARTIAL - OVARIES IN PLACE.    LAPAROSCOPIC GASTRECTOMY SLEEVE WITH HIATAL HERNIA N/A 07/28/2020    Procedure: LAPAROSCOPIC GASTRECTOMY SLEEVE WITH HIATAL HERNIA, OMENTOPEXY, LAPAROSCOPIC RESECTION OF ANTRAL AND FUNDIC GASTRIC MASS;  Surgeon: Eliberto Oris SAUNDERS, DO;  Location: [place] [facility];  Service: General;  Laterality: N/A;   [2]   Social History  Tobacco Use    Smoking  status: Never    Smokeless tobacco: Never    Tobacco comments:     Never smoked or used tobacco   Vaping Use    Vaping status: Never Used   Substance Use Topics    Alcohol use: Yes     Alcohol/week: 0.0 - 1.0 standard drinks of alcohol     Comment: 1-2 DRINKS MONTHLY    Drug use: Never   [3]   Current Outpatient Medications   Medication Sig Dispense Refill    atorvastatin  (LIPITOR) 40 MG tablet Take 1 tablet (40 mg) by mouth once daily 90 tablet 1    benzonatate  (TESSALON ) 100 MG capsule Take 1 capsule (100 mg) by mouth 3 (three) times daily as needed for Cough 30 capsule 0    carvedilol  (COREG ) 6.25 MG tablet Take 1 tablet (6.25 mg) by mouth 2 (two) times daily with meals 180 tablet 1    Diclofenac  Sodium CR 100 MG 24 hr tablet Take by mouth once daily (Patient not taking: Reported on 06/13/2024)      estradiol  (ESTRACE ) 0.1 MG/GM vaginal cream Place 1 g vaginally twice a week TWICE A WEEK - USES DOSE MONDAYS AND WEDNESDAYS (Patient not taking: Reported on 06/13/2024) 42.5 g 0    lidocaine  (LIDODERM ) 5 % Place 1 patch onto the skin in the morning. Remove & Discard patch within 12 hours or as directed by MD. (Patient taking differently: Place 1 patch onto the skin in the morning. Remove & Discard patch within 12 hours or as directed by MD  As needed.) 9 patch 0    methylPREDNISolone (MEDROL DOSEPAK) 4 MG tablet Take 1 tablet (4  mg) by mouth 21 tablest for 5 days. Take 1day 5 tab, 2 , 4 tabs, 3(tabs),  4 (2tabs), 5 (1tab) (Patient not taking: Reported on 06/21/2024)      Multiple Vitamin (multivitamin) capsule Take 1 capsule by mouth daily      Myrbetriq 50 MG Tablet SR 24 hr Take 1 tablet (50 mg) by mouth once daily      phentermine  15 MG capsule Take 1 capsule (15 mg) by mouth once every morning X 7 days then increase to 37.5 mg daily (separate prescription) (Patient not taking: Reported on 06/13/2024) 7 capsule 0    phentermine  37.5 MG capsule Take 1 capsule (37.5 mg) by mouth once every morning (Patient not taking:  Reported on 06/13/2024) 30 capsule 1    vitamin A  3 MG (10000 UT) capsule Take 1 capsule (10,000 Units) by mouth daily 20 capsule 0    vitamin D  (CHOLECALCIFEROL) 25 MCG (1000 UT) tablet Take 5 tablets (5,000 Units) by mouth daily (Patient not taking: Reported on 06/13/2024)      vitamin D , ergocalciferol , (DRISDOL ) 50000 UNIT Cap Take 1 capsule (50,000 Units) by mouth once a week 12 capsule 1     No current facility-administered medications for this visit.   [4] No Known Allergies

## 2024-07-09 ENCOUNTER — Ambulatory Visit (INDEPENDENT_AMBULATORY_CARE_PROVIDER_SITE_OTHER)

## 2024-07-09 ENCOUNTER — Ambulatory Visit (INDEPENDENT_AMBULATORY_CARE_PROVIDER_SITE_OTHER): Admitting: Student in an Organized Health Care Education/Training Program

## 2024-07-14 NOTE — Progress Notes (Unsigned)
 Name: Barbara Rubio Age: 73 y.o. Date of Service: 07/15/2024  Referring Physician: Tobie Hue, MD  Date of Injury: 05/24/2024  Plan of Care Dates From:07/15/2024 To: 10/12/2024  Date Treatment Started: 07/15/2024    Sessions in Plan of Care: 24       Visit Count: 1   Diagnosis:    Diagnosis ICD-10-CM Associated Order   1. Other low back pain  M54.59       2. Left leg pain  M79.605       3. Radiculopathy, lumbar region  M54.16           Subjective     History of Present Illness   Mechanism of injury: onset LBP w/L LE radic 05/24/24, insidious onset (woke w/pain L LE). Symptoms may have been triggered by sitting in bed the weekend prior working on computer.  Symptoms have been progressively worsening over time.  History of Present Illness:   Arthritis B feet - pain in arches w/diminishment of symptoms after using cream given by MD    Per MD visit 07/08/24:  Barbara Rubio is a 73 y.o. female who presents today for evaluation of left leg pain. Her symptoms have been present for approximately 6 weeks and have been worsening over time.  She was seen regarding this issue at the [facility] ED on 05/24/2024 where x-rays were obtained and noted to be negative for any acute fractures.  She was discharged with a prescription of Flexeril  and Medrol Dosepak.  She notes hip pain that is primarily localized to the lower back. The pain is constant in nature. The patient also endorses pain that radiates down her left leg all the way to her ankle and associated numbness/paresthesias. She denies groin pain at this time. No recent injury/trauma. The pain temporarily improves with OTC pain relievers and is exacerbated by walking, standing, and resting at nighttime.  She was seen regarding this issue by 2 external providers.  The first provider gave her a cortisone injection for her left hip which did not help her at all.  The second provider ordered an MRI of her lumbar spine and recommended an injection for her lower back.   She presents fo a third opinion today and to further discuss treatment options.    Medications: yes --  Acetaminophen    Injections: yes --left hip intra-articular cortisone injection on 06/02/2024 --no improvement  Ambulatory aid: Cane     Past Hip or Knee surgery: None     Medical Hx: Hypertension, hyperlipidemia.  Opioid Use: no current use  Smoking Status: Non-smoker    Previous Xrays:  AP pelvis and left hip Xray demonstrate moderate joint space narrowing associated with osteophyte formation. No other acute pathology noted. Impression: Moderate arthritis of left hip.       Functional Limitations (PLOF): Unable to sleep thru the night - L LE pain increases when in bed and wakes w/p! Every night at 3am. Tries to sleep supine w/pillow under knees and heating pad. Difficulty getting up out of tub. Increased pain to walk, get dressed, shower and cook. Walking tol 15 min then must sit. Standing tol 10 min. Increased pain to lift L LE to don/doff socks/shoes - pain in groin. Increased pain to negotiate stairs reciprocally - usually one step at a time currently. Difficulty getting in/out of car - compensates and must lift L LE w/UE. Difficulty transferring sit to stand - must use UEs for assistance.  Prior to onset was able to perform all  of the above without difficulty or pain.      Social Support/Occupation    Lives in: one story house    Lives with: alone    Occupation: [employer] - currently not working           Precautions: No data was found  Allergies: Allergies[1]    Medical History[2]    Objective    Outcomes:  Goal: 51 Initial  07/15/2024            Primary Functional Status Measure 29       Oswestry        Pain: /10 /10 /10 /10 /10   Pain last 24 hrs 7       Least pain last 30 days 6       Greatest pain last 30 days 7       PSFS Activities: /10 /10 /10 /10 /10   1. walk 3       2. Get Dressed, [employer] traffic [employer], [employer] 4       3. Sleep at night..Pain intensify while in bed sleeping.Wake me up every  night@3 :00 am  2         Lumbar AROM  Initial           Flexion        Extension        Side Bend R        Side Bend L        Rotation R        Rotation L        (blank fields were intentionally left blank)    LE Strength  (lbs) Initial  07/15/2024   R Initial  07/15/2024    L   R   L   R   L   R   L   R   L   Hip Flex Seated (L1/2) 16.4 9.0           Hip Flex Supine (L1/2)             Hip Ext Knee Flex             Hip Ext Knee Ext             Hip Abd 3.5 0           Hip Add             Hip IR             Hip ER             Quads (L3) 29.8 13.6           HS Seated (S1) 20 13.6           HS Prone (S1)             Ankle DF (L4)             Ankle PF             Toe Ext (L5)             (blank fields were intentionally left blank)     Flexibility  Initial R    Initial L    R L R L   Hamstrings         Quads         Gastrocs         (blank fields were intentionally left blank)    Balance (sec) Initial  SLS R: Eyes Open (EO):        SLS R: Eyes Closed (EC):        SLS L: Eyes Open (EO):        SLS L: Eyes Closed (EC):        (blank fields were intentionally left blank)       Integumentary Screen 07/15/2024 : No wound, lesion or rash noted    Neurological Screen 07/15/2024 :  Sensation to Light touch: Intact         Gait   Gait Speed: decreased  Left Side: altered trunk rotation with decreased arm swing decreased stance phase decreased heel strike decreased toe off decreased hip extension  Right Side: altered trunk rotation with decreased arm swing  Using std cane in R hand    Tests       Thoracic   Positive slump (L).   Lumbar/Pelvic Girdle/Sacrum   Positive slump (L).             BP: 142/86 Heart Rate: 70          Treatment     Therapeutic Activity - Justified to address the following:  Dynamic activities to improve functional performance.  - Sleep Positions:   Educated patient on proper sleep positions to avoid LB/LE pain by utilizing 2 pillows between knees to equally distribute body weight and reduce mechanical  force when side lying and to place pillow under knees when in supine. Educated patient on pillow height to make up distance between shoulder and ear to prevent side bending during sleep in side lying and to promote neutral cervical spine when supine. Each position demonstrated and practiced.      - Bed Mobility:   Patient educated and demo provided on how to get in/out of bed with safe body mechanics.  Instructed patient to sit on bed and transition to/from side, lowering body with UE assist while simultaneously lifting/lowering LE.  Once in position, cues for log rolling technique to prevent strain on spine.           Modalities   Skin check completed      ---      Flowsheet Row ---   Total Time    Timed Minutes 23 minutes   Untimed Minutes 20 minutes   Total Time 43 minutes     Assessment      Barbara Rubio is a 73 y.o. female presenting with LBP w/L LE radiculopathy who requires skilled Physical Therapy services.    Clinical presentation: evolving - initial pain that is moving or expanding in location/intensity/type  Barriers to therapy: Patient's age - delayed healing  Time since onset of injury/illness/exacerbation - causing increased atrophy, increased biomechanical compensation over time, and sensitization to symptoms  Past surgical history - L bunionectomy, partial hysterectomy, laparoscopic gastrectomy sleeve  Mechanism of injury/illness/exacerbation insidious onset with no known mechanism  Comorbidities - GERD, hypercholesterolemia, hyperlipidemia, HTN, psoriatic arthritis, urinary incontinence  Occupational requirements - required to walk and stand for long durations    Functional Limitations (PLOF): Unable to sleep thru the night - L LE pain increases when in bed and wakes w/p! Every night at 3am. Tries to sleep supine w/pillow under knees and heating pad. Difficulty getting up out of tub. Increased pain to walk, get dressed, shower and cook. Walking tol 15 min then must sit. Standing tol 10 min. Increased pain  to lift L LE to don/doff socks/shoes - pain in groin. Increased pain to negotiate stairs reciprocally -  usually one step at a time currently. Difficulty getting in/out of car - compensates and must lift L LE w/UE. Difficulty transferring sit to stand - must use UEs for assistance.  Prior to onset was able to perform all of the above without difficulty or pain.    Prognosis: excellent  Impairments: Pain that limits and interferes with functional ability  Decreased range of motion  Decreased strength  Decreased functional stability  Decreased static balance  Decreased dynamic balance  Patient is aware of diagnosis, prognosis and consents to plan of care: Yes  Plan   Visits per week: 2  Number of Sessions: 24  Direct One on One  02889: Therapeutic Exercise: To Develop Strength and Endurance, ROM and Flexibility  [id number]: Gait Training  02887: Neuromuscular Reeducation (Proprioceptive Neuromuscular Facilitation)  [phone]: Self Care/Home Mgmt Training (ADLs, safety procedures, use of assistive devices)  97140: Manual Therapy techniques (mobilization, manipulation, manual traction) (Grade I-V to lumbar spine, pelvic girdle, and regionally interdependent joints, soft tissue mobilization, instrument assisted soft tissue mobilization.)  97530: Therapeutic Activities: Dynamic activities to improve functional performance  Dry Needling  Plan for Next Session -: assess lumbar and hip ROM, jt mobility, hip special tests, SL balance, HS flexibility. Provide HEP. Bike, lumbar stab, core/LE strengthening, balance, MT.         Goals      Goal 1: Patient will demonstrate independence in prescribed initial HEP with proper form, sets and reps for safe completion in conjunction with PT POC     Sessions: 6      Goal 2: Improve Physical FS Primary Measure from 29 to 51 or better to demonstrate change for significant functional improvement.      Sessions: 24      Goal 3: Patient will demonstrate independence in prescribed HEP with proper form,  sets and reps for safe discharge to an independent program.   Sessions: 24      Goal 4: Increase knee ext strength to 40 lb to allow patient to transfer sit <-> stand from a regular toilet seat and chair.   Sessions: 24          Goal 5: Increase hip abduction and extensor strength to 20 lb to allow for ambulation > 20 minutes.   Sessions: 24                           Barbara Rubio, PT             [1] No Known Allergies  [2]   Past Medical History:  Diagnosis Date    Abnormal vision     wears glasses    Arthritis     Constipation     HX - HAS BEEN MORE PREVELANT POST BARIATRIC SURGERY.    COVID-19 10/2021    HX - 21//23 URGENT CARE NOTE IN EPIC.    Fibroids     PT HAD A PARTIAL HYSTERECTOMY - OVARIES ARE STILL IN PLACE.    Gastroesophageal reflux disease 10/23/2013    HX.    Hypercholesterolemia 03/23/2010    High Bad Cholesterol Level    Hyperlipidemia     MANAGED WITH MEDS.    Hypertension     PT STATES HE BP RUNS FROM 128-130/70s. MANAGED WITH MEDS - FOLLOWED BY PCP.    Left wrist fracture 2018    HX    LUQ pain     HX    Psoriatic arthritis (CMS/HCC)  HX - LOWER BACK, LEFT HAND, WRIST AND FINGERS    Urinary incontinence 12/21/2017    HX OVERACTIVE BLADDER. ISSUE RESOLVED POST BARIATRIC SURGERY - PT STATED SHE LOST 65 LBS.

## 2024-07-15 ENCOUNTER — Inpatient Hospital Stay
Attending: Student in an Organized Health Care Education/Training Program | Admitting: Rehabilitative and Restorative Service Providers"

## 2024-07-15 ENCOUNTER — Encounter: Payer: Self-pay | Admitting: Rehabilitative and Restorative Service Providers"

## 2024-07-15 VITALS — BP 142/86 | HR 70

## 2024-07-15 DIAGNOSIS — M5459 Other low back pain: Secondary | ICD-10-CM | POA: Insufficient documentation

## 2024-07-15 DIAGNOSIS — M5416 Radiculopathy, lumbar region: Secondary | ICD-10-CM | POA: Insufficient documentation

## 2024-07-15 DIAGNOSIS — M79605 Pain in left leg: Secondary | ICD-10-CM | POA: Insufficient documentation

## 2024-07-15 NOTE — Care and Service Plan (Signed)
 Name:Barbara Rubio Age: 73 y.o.   Today's Date: 07/15/2024  Referring Physician: Tobie Hue, MD   Date of Injury: No data found 05/24/2024  PT Date Care Plan Established/Reviewed:07/15/2024  PT Date Treatment Started:07/15/2024       Sessions in Plan of Care: 24        Visit Count: 1   Diagnosis:    Diagnosis ICD-10-CM Associated Order   1. Other low back pain  M54.59       2. Left leg pain  M79.605       3. Radiculopathy, lumbar region  M54.16             Precautions: No data was found    Outcomes:  Goal: 51 Initial  07/15/2024            Primary Functional Status Measure 29       Oswestry        Pain: /10 /10 /10 /10 /10   Pain last 24 hrs 7       Least pain last 30 days 6       Greatest pain last 30 days 7       PSFS Activities: /10 /10 /10 /10 /10   1. walk 3       2. Get Dressed, [employer] traffic controller, [employer] 4       3. Sleep at night..Pain intensify while in bed sleeping.Wake me up every night@3 :00 am  2         Lumbar AROM  Initial           Flexion        Extension        Side Bend R        Side Bend L        Rotation R        Rotation L        (blank fields were intentionally left blank)    LE Strength  (lbs) Initial  07/15/2024   R Initial  07/15/2024    L   R   L   R   L   R   L   R   L   Hip Flex Seated (L1/2) 16.4 9.0           Hip Flex Supine (L1/2)             Hip Ext Knee Flex             Hip Ext Knee Ext             Hip Abd 3.5 0           Hip Add             Hip IR             Hip ER             Quads (L3) 29.8 13.6           HS Seated (S1) 20 13.6           HS Prone (S1)             Ankle DF (L4)             Ankle PF             Toe Ext (L5)             (blank fields were intentionally left blank)     Flexibility  Initial R  Initial L    R L R L   Hamstrings         Quads         Gastrocs         (blank fields were intentionally left blank)    Balance (sec) Initial           SLS R: Eyes Open (EO):        SLS R: Eyes Closed (EC):        SLS L: Eyes Open (EO):        SLS L: Eyes Closed (EC):         (blank fields were intentionally left blank)       Integumentary Screen 07/15/2024 : No wound, lesion or rash noted    Neurological Screen 07/15/2024 :  Sensation to Light touch: Intact         Gait   Gait Speed: decreased  Left Side: altered trunk rotation with decreased arm swing decreased stance phase decreased heel strike decreased toe off decreased hip extension  Right Side: altered trunk rotation with decreased arm swing  Using std cane in R hand    Tests       Thoracic   Positive slump (L).   Lumbar/Pelvic Girdle/Sacrum   Positive slump (L).             BP: 142/86 Heart Rate: 70         Assessment     Barbara Rubio is a 73 y.o. female presenting with LBP w/L LE radiculopathy who requires skilled Physical Therapy services.    Clinical presentation: evolving - initial pain that is moving or expanding in location/intensity/type  Barriers to therapy: Patient's age - delayed healing  Time since onset of injury/illness/exacerbation - causing increased atrophy, increased biomechanical compensation over time, and sensitization to symptoms  Past surgical history - L bunionectomy, partial hysterectomy, laparoscopic gastrectomy sleeve  Mechanism of injury/illness/exacerbation insidious onset with no known mechanism  Comorbidities - GERD, hypercholesterolemia, hyperlipidemia, HTN, psoriatic arthritis, urinary incontinence  Occupational requirements - required to walk and stand for long durations    Functional Limitations (PLOF): Unable to sleep thru the night - L LE pain increases when in bed and wakes w/p! Every night at 3am. Tries to sleep supine w/pillow under knees and heating pad. Difficulty getting up out of tub. Increased pain to walk, get dressed, shower and cook. Walking tol 15 min then must sit. Standing tol 10 min. Increased pain to lift L LE to don/doff socks/shoes - pain in groin. Increased pain to negotiate stairs reciprocally - usually one step at a time currently. Difficulty getting in/out of car - compensates  and must lift L LE w/UE. Difficulty transferring sit to stand - must use UEs for assistance.  Prior to onset was able to perform all of the above without difficulty or pain.    Prognosis: excellent  Impairments: Pain that limits and interferes with functional ability  Decreased range of motion  Decreased strength  Decreased functional stability  Decreased static balance  Decreased dynamic balance  Patient is aware of diagnosis, prognosis and consents to plan of care: Yes  Plan   Visits per week: 2  Number of Sessions: 24  Direct One on One  02889: Therapeutic Exercise: To Develop Strength and Endurance, ROM and Flexibility  [id number]: Gait Training  02887: Neuromuscular Reeducation (Proprioceptive Neuromuscular Facilitation)  [phone]: Self Care/Home Mgmt Training (ADLs, safety procedures, use of assistive devices)  97140: Manual Therapy techniques (mobilization, manipulation,  manual traction) (Grade I-V to lumbar spine, pelvic girdle, and regionally interdependent joints, soft tissue mobilization, instrument assisted soft tissue mobilization.)  97530: Therapeutic Activities: Dynamic activities to improve functional performance  Dry Needling  Plan for Next Session -: assess lumbar and hip ROM, jt mobility, hip special tests, SL balance, HS flexibility. Provide HEP. Bike, lumbar stab, core/LE strengthening, balance, MT.         Goals      Goal 1: Patient will demonstrate independence in prescribed initial HEP with proper form, sets and reps for safe completion in conjunction with PT POC     Sessions: 6      Goal 2: Improve Physical FS Primary Measure from 29 to 51 or better to demonstrate change for significant functional improvement.      Sessions: 24      Goal 3: Patient will demonstrate independence in prescribed HEP with proper form, sets and reps for safe discharge to an independent program.   Sessions: 24      Goal 4: Increase knee ext strength to 40 lb to allow patient to transfer sit <-> stand from a regular toilet  seat and chair.   Sessions: 24          Goal 5: Increase hip abduction and extensor strength to 20 lb to allow for ambulation > 20 minutes.   Sessions: 24                           [name], PT

## 2024-07-17 ENCOUNTER — Encounter (INDEPENDENT_AMBULATORY_CARE_PROVIDER_SITE_OTHER): Payer: Self-pay | Admitting: Orthopaedic Surgery of the Spine

## 2024-07-17 ENCOUNTER — Ambulatory Visit (INDEPENDENT_AMBULATORY_CARE_PROVIDER_SITE_OTHER): Admitting: Orthopaedic Surgery of the Spine

## 2024-07-17 DIAGNOSIS — M21372 Foot drop, left foot: Secondary | ICD-10-CM

## 2024-07-17 DIAGNOSIS — M4727 Other spondylosis with radiculopathy, lumbosacral region: Secondary | ICD-10-CM

## 2024-07-17 DIAGNOSIS — M5127 Other intervertebral disc displacement, lumbosacral region: Secondary | ICD-10-CM

## 2024-07-17 DIAGNOSIS — M4726 Other spondylosis with radiculopathy, lumbar region: Secondary | ICD-10-CM

## 2024-07-17 DIAGNOSIS — M5126 Other intervertebral disc displacement, lumbar region: Secondary | ICD-10-CM

## 2024-07-17 DIAGNOSIS — M4316 Spondylolisthesis, lumbar region: Secondary | ICD-10-CM

## 2024-07-17 NOTE — Progress Notes (Signed)
 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    On August 2nd, she experienced acute onset of severe left leg pain upon awakening, initially radiating down the lateral aspect of the leg and later migrating to the central and posterior regions. The pain was severe and prompted an emergency department visit. Over the past three months, the pain has persisted and is now associated with significant difficulty ambulating.    She reports ongoing weakness in the left leg, particularly involving the thigh and core musculature. The combination of pain and weakness has resulted in marked difficulty with walking.    She has consulted three orthopedic physicians for this issue. Gabapentin  was prescribed by Dr. Hali for pain control and sleep. She recently initiated physical therapy, with her first evaluation occurring two days prior to this visit.    She underwent lumbar spine MRI and bone density testing in August. She has a remote history of left distal radius fracture managed by Dr. Hassan.    Review of Systems    Objective   There were no vitals taken for this visit.  Physical Exam  Physical Exam    Weakness left leg 4-/5 ehl  pos slr     Results  RADIOLOGY  Lumbar spine MRI: L5-S1 nerve compression, disc degeneration, spontaneous fusion of the fourth lumbar vertebra (05/24/2024)        Assessment/Plan   .  1. Spondylolisthesis of lumbar region  Referral to Pain Clinic    Referral to Pain Clinic    CT Lumbar Spine WO Contrast      2. Osteoarthritis of spine with radiculopathy, lumbosacral region  Referral to Pain Clinic    Referral to Pain Clinic    CT Lumbar Spine WO Contrast      3. Other spondylosis with radiculopathy, lumbar region  Referral to Pain  Clinic    Referral to Pain Clinic    CT Lumbar Spine WO Contrast      4. Lumbago-sciatica due to displacement of lumbar intervertebral disc  Referral to Pain Clinic    Referral to Pain Clinic    CT Lumbar Spine WO Contrast      5. Displacement of lumbar intervertebral disc without myelopathy  Referral to Pain Clinic    Referral to Pain Clinic    CT Lumbar Spine WO Contrast      6. Left foot drop  Referral to Pain Clinic    Referral to Pain Clinic    CT Lumbar Spine WO Contrast             Assessment & Plan  Lumbar disc degeneration with left leg radiculopathy  Chronic lumbar disc degeneration with associated left leg radiculopathy for nearly three months, resulting in significant pain, weakness, and impaired mobility. MRI demonstrates disc degeneration and nerve impingement at L5-S1, accounting for the majority of symptoms. Non-surgical management is preferred to reduce nerve inflammation and improve function. Surgical intervention may be considered if conservative measures fail.  - Referred to pain management for epidural steroid injection to reduce nerve inflammation.  - Recommended continuation of physical therapy to strengthen core and thigh musculature.  - Provided education regarding the natural history of disc degeneration and the role of aging.  -  Supplied MRI images with circled areas of concern for review by her and her family.    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 Listhesis may contribute to symptoms. Further imaging is required to clarify bony changes and guide management.  - Ordered lumbar CT scan to further evaluate bony changes, calcium  deposition, and to assess for spondylolisthesis not fully visualized on MRI.  - Emphasized the importance of muscle strengthening to support spinal stability.    Left hip osteoarthritis (possible contribution to  pain)  Left hip osteoarthritis may contribute to symptoms, though it is estimated to account for only 15% of pain. Lumbar pathology remains the primary source.    Verbal consent obtained to record this visit.

## 2024-07-17 NOTE — Progress Notes (Signed)
 Documentation received from Patient

## 2024-07-19 ENCOUNTER — Other Ambulatory Visit: Payer: Self-pay

## 2024-07-22 ENCOUNTER — Inpatient Hospital Stay

## 2024-07-22 DIAGNOSIS — M79605 Pain in left leg: Secondary | ICD-10-CM

## 2024-07-22 DIAGNOSIS — M5459 Other low back pain: Secondary | ICD-10-CM

## 2024-07-22 DIAGNOSIS — M5416 Radiculopathy, lumbar region: Secondary | ICD-10-CM

## 2024-07-22 NOTE — PT/OT Therapy Note (Signed)
 Patient presents with marked increase in pain and using a cane.  She states she is not sure she can go through her treatment today and is wondering if we should proceed with treatment.  She states she will see Dr. Elayne on the 6th for an evaluation and have an injection to manage pain during that workweek.  Patient decides she will not go through with treatment today.  It is suggested to patient to use ice on lower back and sacral area and heat for large muscle groups.  Patient states she has used ice before and it has helped but usually alternates between ice and heat.  Patient will return next PT appointment which is after she receives injection from Dr. Elayne.

## 2024-07-23 ENCOUNTER — Ambulatory Visit

## 2024-07-24 ENCOUNTER — Other Ambulatory Visit (INDEPENDENT_AMBULATORY_CARE_PROVIDER_SITE_OTHER): Payer: Self-pay | Admitting: Family

## 2024-07-30 ENCOUNTER — Encounter (INDEPENDENT_AMBULATORY_CARE_PROVIDER_SITE_OTHER): Payer: Self-pay

## 2024-08-07 ENCOUNTER — Inpatient Hospital Stay

## 2024-08-08 ENCOUNTER — Telehealth (INDEPENDENT_AMBULATORY_CARE_PROVIDER_SITE_OTHER): Payer: Self-pay | Admitting: Family

## 2024-08-08 ENCOUNTER — Encounter (INDEPENDENT_AMBULATORY_CARE_PROVIDER_SITE_OTHER): Payer: Self-pay | Admitting: Family

## 2024-08-08 ENCOUNTER — Ambulatory Visit (INDEPENDENT_AMBULATORY_CARE_PROVIDER_SITE_OTHER): Admitting: Family

## 2024-08-08 VITALS — BP 153/80 | HR 73 | Ht 67.0 in | Wt 217.6 lb

## 2024-08-08 DIAGNOSIS — E782 Mixed hyperlipidemia: Secondary | ICD-10-CM

## 2024-08-08 DIAGNOSIS — I2510 Atherosclerotic heart disease of native coronary artery without angina pectoris: Secondary | ICD-10-CM | POA: Insufficient documentation

## 2024-08-08 DIAGNOSIS — I10 Essential (primary) hypertension: Secondary | ICD-10-CM

## 2024-08-08 DIAGNOSIS — R0609 Other forms of dyspnea: Secondary | ICD-10-CM

## 2024-08-08 DIAGNOSIS — Z9884 Bariatric surgery status: Secondary | ICD-10-CM

## 2024-08-08 MED ORDER — WEGOVY 0.25 MG/0.5ML SC SOAJ
0.2500 mg | SUBCUTANEOUS | 0 refills | Status: DC
Start: 2024-08-08 — End: 2024-08-25

## 2024-08-08 NOTE — Telephone Encounter (Signed)
 Prior Auth submitted through covermymeds:  Yassmin Durnil (Key: [id number])  Wegovy  0.25MG /0.5ML auto-injectors  Form  [employer] Electronic [place] Form [phone] NCPDP)    Had prior denial in August due to it being nonformulary for weight management.  Would be willing to appeal [facility] [employer].  Discussed cash pay options and compounding options at [facility] [place].    Lynwood ONEIDA Janus, FNP

## 2024-08-08 NOTE — Patient Instructions (Addendum)
 Obtain Fasting labs next week for Barbara ONEIDA Janus, FNP and Dr. Jesslyn lipid labs.     [facility]  [facility] at [facility]  [address], [address]  [place], [place] [address]  7am - langley  Monday - Friday  [phone]    Office Building at [facility] hospital. Floor 5.

## 2024-08-08 NOTE — Progress Notes (Signed)
 Cardiology Follow-Up    Patient Name: Barbara Rubio   Date of Birth: 1951/02/17  Provider: Lynwood ONEIDA Janus, FNP   Encounter Date: 08/08/2024     Chief Complaint: Mixed hyperlipidemia and Follow-up (CM Clinic Consult, refd by Dr Martha /No sx )     Impression and Recommendations:     1. ASCVD (arteriosclerotic cardiovascular disease)  Noted on prior CT Abdomen with aortic calcification. Consider CAC scoring in future. No angina.      2. Primary hypertension  Above goal, following with Dr. Evetta.   Recently stopped lisinopril  and started carvedilol .   Obtain labs next week including UACR.    3. Mixed hyperlipidemia  LDL 184 mg/dL & non-HDL 799 mg/dL on Atorvastatin  40 mg daily. Given presence of ACVD and LDL >70, start Leqvio.     4. Severe obesity (CMS/HCC)  5. S/P laparoscopic sleeve gastrectomy  Advised aggressive weight loss and diet interventions. Body mass index is 34.08 kg/m. Patient has severe obesity plus coronary heart disease, dyslipidemias, GERD, hypertension, osteoarthritis, and sleep apnea/hypopnea and has been unable to lose >5% of baseline weight with lifestyle modifications alone (Exercise 150 min/week plus calories restricted diet for at least the last 6 months). Phentermine , Qsymia, and Contrave Contraindicated due to anxiety and hypertension.  The cardiovascular benefits of GLP-1 medications were reviewed including the benefits on myocardial infarction and stroke reduction, reduction in heart failure, reduction in atrial fibrillation, reductions in obstructive sleep apnea, reductions in MASLD, and overall cardiovascular all cause mortality benefit.  The patient will not be receiving 2 or more medications for weight loss at the same time.  I have discussed the potential risks, benefits, realistic expectations associated with GLP therapy and need for long-term follow-up and adherence to behavior modification.  This medication will not be used to treat type 2 diabetes.  The medication will  not be used in combination with another GLP-1 agonist.  The patient denies a personal or family history of medullary thyroid  carcinoma (MTC); personal history of pancreatitis, severe gastrointestinal disease, or multiple endocrine neoplasia syndrome type II (MEN 2).  The patient does not have a history of suicide attempts or active suicidal ideation.    Given these factors, we will start Wegovy  0.25 mg once weekly and uptitrate every 4 weeks to goal dose of 2.4 mg weekly and continue behavioral and lifestyle modifications.     Starting weight 217 pounds.       PLAN:  Start Wegovy  0.25 mg weekly and up-titrate as tolerated  Fasting labs next week  Start Leqvio at [facility]  Follow-up with cardiology as scheduled on 10/27    Total Time was 48 minutes. That includes chart review before the visit, the actual patient visit, and time spent on documentation after the visit.     Orders placed this visit:   Orders Placed This Encounter    NT-ProBNP    Basic Metabolic Panel    Hepatic Function Panel (LFT)    Wegovy  0.25 MG/0.5ML injection       Subjective    History of Present Illness     History of Present Illness  Barbara Rubio is a 73 year old female who presents for evaluation of weight regain and cardiovascular risk management.    Approximately three years ago, she underwent a sleeve gastrectomy, which initially resulted in weight loss. However, she has experienced weight regain despite maintaining a consistent diet. Her diet includes oats, oatmeal, eggs, fruit, chicken, and [place], and she avoids grazing  throughout the day. She notes that her mobility and sleep were better when she had lost weight.    She has never been on GLP-1 medications but is interested in trying these medications to aid in weight management. She has been on atorvastatin  for years, with her dose increased over time from 5 mg to 40 mg due to concerns about her LDL levels. She is worried about her high BMI and family history of  heart issues, including relatives who were overweight and had heart problems. No history of heart attack or stroke. Her family history includes heart disease, with her father and grandmother having died from an enlarged heart.    Her blood pressure has been elevated recently, with readings around 140-150/80-79 mmHg. She has been on lisinopril  in the past but was taken off it due to concerns about its effectiveness. She has been tracking her blood pressure intermittently.    She has a history of arthritis and esophageal reflux. She occasionally experiences constipation, which she manages with laxatives and dietary adjustments.    She is currently in the process of changing her pharmacy plan due to dissatisfaction with her current provider's coverage options.  Social History   Social History[1]  Health Related Quality of Life:     Allergies   Allergies[2]  Medications     Current Outpatient Medications   Medication Instructions    atorvastatin  (LIPITOR) 40 mg, Oral, Daily    carvedilol  (COREG ) 6.25 mg, Oral, 2 times daily with meals    Myrbetriq 50 mg, Daily          Objective    Physical Exam   Visit Vitals  BP 153/80 (BP Site: Left arm, Patient Position: Sitting, Cuff Size: Large)   Pulse 73   Ht 1.702 m (5' 7)   Wt 98.7 kg (217 lb 9.6 oz)   SpO2 100%   BMI 34.08 kg/m     Wt Readings from Last 3 Encounters:   08/08/24 98.7 kg (217 lb 9.6 oz)   07/08/24 99.8 kg (220 lb)   06/21/24 100.7 kg (222 lb)     Physical Exam  Vitals reviewed.   Constitutional:       General: She is not in acute distress.     Appearance: Normal appearance. She is well-developed. She is obese.   Neck:      Vascular: No carotid bruit or JVD.   Cardiovascular:      Rate and Rhythm: Normal rate and regular rhythm. No extrasystoles are present.     Chest Wall: No thrill.      Pulses: Normal pulses.      Heart sounds: S1 normal and S2 normal. No murmur heard.     No friction rub. No gallop.   Pulmonary:      Effort: Pulmonary effort is normal.       Breath sounds: Normal breath sounds. No stridor. No decreased breath sounds, wheezing, rhonchi or rales.   Chest:      Chest wall: No mass, tenderness or crepitus.   Abdominal:      General: There is no distension.   Musculoskeletal:      Right lower leg: No edema.      Left lower leg: No edema.   Skin:     General: Skin is warm and dry.      Capillary Refill: Capillary refill takes less than 2 seconds.      Coloration: Skin is not pale.      Findings: No erythema.  Nails: There is no clubbing.   Neurological:      Mental Status: She is alert and oriented to person, place, and time.   Psychiatric:         Mood and Affect: Mood normal.         Behavior: Behavior normal.        Labs     Lab Results   Component Value Date    WBC 5.12 05/29/2024    HGB 13.6 05/29/2024    HCT 43.7 05/29/2024    PLT 165 05/29/2024    NA 142 05/29/2024    K 4.8 05/29/2024    MG 1.8 07/29/2020    BUN 19 05/29/2024    CREAT 1.1 (H) 05/29/2024    EGFR 53.1 (L) 05/29/2024    GLU 100 05/29/2024    CHOL 278 (H) 05/29/2024    TRIG 78 05/29/2024    HDL 78 05/29/2024    LDL 184 (H) 05/29/2024    AST 17 05/29/2024    ALT 14 05/29/2024    HGBA1C 5.5 06/09/2020    TSH 1.29 05/29/2024       Cardiogenic Testing (Personally Reviewed by myself):   Echocardiogram 06/16/2024: EF 65%.  No significant valvular dysfunction.    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 Isay Perleberg, FNP-C, CCK, AACC         [1]   Social History  Tobacco Use    Smoking status: Never    Smokeless tobacco: Never    Tobacco comments:     Never smoked or used tobacco   Vaping Use    Vaping status: Never Used   Substance Use Topics    Alcohol use: Yes     Alcohol/week: 0.0 - 1.0 standard drinks of alcohol     Comment: 1-2 DRINKS MONTHLY    Drug use: Never   [2] No Known Allergies

## 2024-08-11 ENCOUNTER — Encounter (INDEPENDENT_AMBULATORY_CARE_PROVIDER_SITE_OTHER): Payer: Self-pay

## 2024-08-11 ENCOUNTER — Inpatient Hospital Stay

## 2024-08-11 ENCOUNTER — Encounter (INDEPENDENT_AMBULATORY_CARE_PROVIDER_SITE_OTHER): Payer: Self-pay | Admitting: Family

## 2024-08-11 NOTE — Telephone Encounter (Signed)
 Received the following message, when I checked on status of PA:      Wait for Determination  Please wait for [employer] to return a determination.

## 2024-08-11 NOTE — PT/OT Therapy Note (Deleted)
 Name: Gara Kincade Age: 73 y.o. Date of Service: 08/11/2024  Referring Physician: Tobie Hue, MD  Date of Injury: No data found  Plan of Care Dates From:No data found To: No data was found  Date Treatment Started: No data found    Sessions in Plan of Care: No data was found       Visit Count: Visit count could not be calculated. Make sure you are using a visit which is associated with an episode.   Diagnosis: No diagnosis found.                   Precautions: No data was found  Allergies: Patient has no known allergies.    Objective        Initial Evaluation Reference and/or Current Measurements(as dated):  Outcomes:  Goal: 51 Initial  07/15/2024                  Primary Functional Status Measure 29           Oswestry             Pain: /10 /10 /10 /10 /10   Pain last 24 hrs 7           Least pain last 30 days 6           Greatest pain last 30 days 7           PSFS Activities: /10 /10 /10 /10 /10   1. walk 3           2. Get Dressed, Air traffic controller, [employer] 4           3. Sleep at night..Pain intensify while in bed sleeping.Wake me up every night@3 :00 am  2             Lumbar AROM  Initial    08/11/24           Flexion  ***           Extension  ***           Side Bend R  ***           Side Bend L  ***           Rotation R  ***           Rotation L  ***           (blank fields were intentionally left blank)     LE Strength  (lbs) Initial  07/15/2024   R Initial  07/15/2024    L    R    L    R    L    R    L    R    L   Hip Flex Seated (L1/2) 16.4 9.0                   Hip Flex Supine (L1/2)                       Hip Ext Knee Flex                       Hip Ext Knee Ext                       Hip Abd 3.5 0                   Hip  Add                       Hip IR                       Hip ER                       Quads (L3) 29.8 13.6                   HS Seated (S1) 20 13.6                   HS Prone (S1)                       Ankle DF (L4)                       Ankle PF                       Toe Ext (L5)                        (blank fields were intentionally left blank)      Flexibility  Initial R    Initial L    R L R L   Hamstrings               Quads               Gastrocs               (blank fields were intentionally left blank)     Balance (sec) Initial               SLS R: Eyes Open (EO):             SLS R: Eyes Closed (EC):             SLS L: Eyes Open (EO):             SLS L: Eyes Closed (EC):             (blank fields were intentionally left blank)            {Objective Data Over Time (Optional):68659}      Treatment     Therapeutic Exercises - Justified to address any of the following:  To develop strength, endurance, ROM and/or flexibility.     Introduced recumbent bike at level 1 for 5 min to assess tolerance and promote soft tissue extensibility, with conversation to obtain subjective and plan treatment session.    Designed HEP    Added: sciatic nerve glides    Added: piriformis stretch      Neuromuscular Re-Education - Justified to address any of the following:   Re-education of movement, balance, coordination, kinesthetic sense, posture and/or proprioception for sitting and/or standing activities.     Added: Patient education on performing TrA activation in hook lying w/ verbal/tactile cues to facilitate proprioception of deep abdominal contraction  -Provided education on performing tactile self-assessment to ensure appropriate activation during HEP    Added: samurai    Added: SAQ in *** position over red bolster *** x *** each R/L with verbal/tactile cues to increase proprioception of quad activation.    Added: SLR in supine with knees over red bolster *** x *** each R/L with  verbal cues to ensure carryover of quad set    Added: LAQ at edge of plinth *** x *** each R/L with verbal/visual cues for carryover of glutes set and avoiding compensations via posterior trunk lean.      Manual Therapy - Justified to address any of the following:    Mobilization of joints and soft tissues, manipulation, manual lymphatic  drainage, and/or manual traction.      Prone:  -STM to lumbar paraspinals      Assessment          Rosaline JONELLE Caper, LPTA

## 2024-08-11 NOTE — Progress Notes (Signed)
 Scanned and faxed successfully patients referral form for Leqvio to [facility]

## 2024-08-12 ENCOUNTER — Other Ambulatory Visit (INDEPENDENT_AMBULATORY_CARE_PROVIDER_SITE_OTHER)

## 2024-08-12 ENCOUNTER — Other Ambulatory Visit (INDEPENDENT_AMBULATORY_CARE_PROVIDER_SITE_OTHER): Payer: Self-pay

## 2024-08-12 DIAGNOSIS — E782 Mixed hyperlipidemia: Secondary | ICD-10-CM

## 2024-08-12 DIAGNOSIS — I2510 Atherosclerotic heart disease of native coronary artery without angina pectoris: Secondary | ICD-10-CM

## 2024-08-12 DIAGNOSIS — I10 Essential (primary) hypertension: Secondary | ICD-10-CM

## 2024-08-12 DIAGNOSIS — R0609 Other forms of dyspnea: Secondary | ICD-10-CM

## 2024-08-12 LAB — HEPATIC FUNCTION PANEL (LFT)
ALT: 10 U/L (ref <=55)
AST (SGOT): 17 U/L (ref <=41)
Albumin/Globulin Ratio: 1.5 (ref 0.9–2.2)
Albumin: 4 g/dL (ref 3.5–4.9)
Alkaline Phosphatase: 93 U/L (ref 37–117)
Bilirubin Direct: 0.2 mg/dL (ref 0.0–0.5)
Bilirubin Indirect: 0.6 mg/dL (ref 0.2–1.0)
Bilirubin, Total: 0.8 mg/dL (ref 0.2–1.2)
Globulin: 2.6 g/dL (ref 2.0–3.6)
Hemolysis Index: 4 {index}
Protein, Total: 6.6 g/dL (ref 6.0–8.3)

## 2024-08-12 LAB — BASIC METABOLIC PANEL
Anion Gap: 9 (ref 5.0–15.0)
BUN: 11 mg/dL (ref 7–21)
CO2: 25 meq/L (ref 17–29)
Calcium: 9.5 mg/dL (ref 7.9–10.2)
Chloride: 109 meq/L (ref 99–111)
Creatinine: 0.9 mg/dL (ref 0.4–1.0)
GFR: >60 mL/min/1.73 m2 (ref >=60.0)
Glucose: 88 mg/dL (ref 70–100)
Hemolysis Index: 4 {index}
Potassium: 4.4 meq/L (ref 3.5–5.3)
Sodium: 143 meq/L (ref 135–145)

## 2024-08-12 LAB — LIPID PANEL
Cholesterol / HDL Ratio: 3.7 {index}
Cholesterol: 208 mg/dL — ABNORMAL HIGH (ref <=199)
HDL: 56 mg/dL (ref >=40)
LDL Calculated: 130 mg/dL — ABNORMAL HIGH (ref 0–99)
Triglycerides: 121 mg/dL (ref 34–149)
VLDL Calculated: 21 mg/dL (ref 10–40)

## 2024-08-12 LAB — NT-PROBNP: NT-ProBNP: 114.5 pg/mL (ref <=334.1)

## 2024-08-12 NOTE — PT/OT Therapy Note (Unsigned)
 Name: Barbara Rubio Age: 73 y.o. Date of Service: 08/13/2024  Referring Physician: Tobie Hue, MD  Date of Injury: No data found  Plan of Care Dates From:No data found To: No data was found  Date Treatment Started: No data found    Sessions in Plan of Care: No data was found       Visit Count: Visit count could not be calculated. Make sure you are using a visit which is associated with an episode.   Diagnosis: No diagnosis found.    onset LBP w/L LE radic 05/24/24, insidious onset (woke w/pain L LE). Symptoms may have been triggered by sitting in bed the weekend prior working on computer. Symptoms have been progressively worsening over time.                  Precautions: No data was found  Allergies: Patient has no known allergies.    Objective    Outcomes:  Goal: 51 Initial  07/15/2024                  Primary Functional Status Measure 29           Oswestry             Pain: /10 /10 /10 /10 /10   Pain last 24 hrs 7           Least pain last 30 days 6           Greatest pain last 30 days 7           PSFS Activities: /10 /10 /10 /10 /10   1. walk 3           2. Get Dressed, Air traffic controller, [employer] 4           3. Sleep at night..Pain intensify while in bed sleeping.Wake me up every night@3 :00 am  2             Lumbar AROM  Initial               Flexion             Extension             Side Bend R             Side Bend L             Rotation R             Rotation L             (blank fields were intentionally left blank)     LE Strength  (lbs) Initial  07/15/2024   R Initial  07/15/2024    L    R    L    R    L    R    L    R    L   Hip Flex Seated (L1/2) 16.4 9.0                   Hip Flex Supine (L1/2)                       Hip Ext Knee Flex                       Hip Ext Knee Ext  Hip Abd 3.5 0                   Hip Add                       Hip IR                       Hip ER                       Quads (L3) 29.8 13.6                   HS Seated (S1) 20 13.6                   HS Prone (S1)                        Ankle DF (L4)                       Ankle PF                       Toe Ext (L5)                       (blank fields were intentionally left blank)      Flexibility  Initial R    Initial L    R L R L   Hamstrings               Quads               Gastrocs               (blank fields were intentionally left blank)     Balance (sec) Initial               SLS R: Eyes Open (EO):             SLS R: Eyes Closed (EC):             SLS L: Eyes Open (EO):             SLS L: Eyes Closed (EC):             (blank fields were intentionally left blank)              {Objective Data Over Time (Optional):68659}      Treatment     Therapeutic Exercises - Justified to address any of the following:  To develop strength, endurance, ROM and/or flexibility.   recumbent bike L3.5 x 6 min w/subjective discussion and to prepare for session.     LTR w/feet on ball x 20 w/cues for TrA    Supine hip add w/*** 10 sec hold x 15 w/cues for TrA    Supine unilateral hip abd w/*** 2 x 10 each side w/cues for TrA    Neuromuscular Re-Education - Justified to address any of the following:   Re-education of movement, balance, coordination, kinesthetic sense, posture and/or proprioception for sitting and/or standing activities.   Samaurai 10 sec hold x 15 w/cues to facilitate TrA recruitment to improve stability during static activities     Bridging w/feet on ball 5 sec hold 2 x 10 w/cues to facilitate TrA and gluts to improve stability w/dynamic activities       Assessment  Pt challenged w/new exercises however able to perform all without c/o *** pain.     Patient requires continued skilled care to: improve ROM, strength and stability so pt can return to PLOF  Plan   Continue with POC          [name], PT

## 2024-08-13 ENCOUNTER — Ambulatory Visit (INDEPENDENT_AMBULATORY_CARE_PROVIDER_SITE_OTHER): Payer: Self-pay | Admitting: Family

## 2024-08-13 ENCOUNTER — Inpatient Hospital Stay
Attending: Student in an Organized Health Care Education/Training Program | Admitting: Rehabilitative and Restorative Service Providers"

## 2024-08-13 ENCOUNTER — Encounter (INDEPENDENT_AMBULATORY_CARE_PROVIDER_SITE_OTHER): Payer: Self-pay | Admitting: Family

## 2024-08-13 DIAGNOSIS — M5459 Other low back pain: Secondary | ICD-10-CM | POA: Insufficient documentation

## 2024-08-13 DIAGNOSIS — M5416 Radiculopathy, lumbar region: Secondary | ICD-10-CM | POA: Insufficient documentation

## 2024-08-13 DIAGNOSIS — M79605 Pain in left leg: Secondary | ICD-10-CM | POA: Insufficient documentation

## 2024-08-14 ENCOUNTER — Encounter (INDEPENDENT_AMBULATORY_CARE_PROVIDER_SITE_OTHER): Payer: Self-pay

## 2024-08-14 NOTE — Telephone Encounter (Signed)
 Outcome  Approved on October 20 by [facility] Medicare [employer]  Your request has been approved  Effective Date: 10/24/2023  Authorization Expiration Date: 08/11/2025      Letter scanned into chart

## 2024-08-15 LAB — APOLIPOPROTEIN A1 AND B
Apolipoprotein A1: 159 mg/dL (ref >=140)
Apolipoprotein B / A1 Ratio: 0.6
Apolipoprotein B: 94 mg/dL

## 2024-08-15 LAB — LIPOPROTEIN A (LPA): Lipoprotein (a): 578 nmol/L — ABNORMAL HIGH (ref <75)

## 2024-08-17 ENCOUNTER — Other Ambulatory Visit (INDEPENDENT_AMBULATORY_CARE_PROVIDER_SITE_OTHER): Payer: Self-pay | Admitting: Family

## 2024-08-18 ENCOUNTER — Encounter (INDEPENDENT_AMBULATORY_CARE_PROVIDER_SITE_OTHER): Payer: Self-pay | Admitting: Cardiology

## 2024-08-18 ENCOUNTER — Ambulatory Visit (INDEPENDENT_AMBULATORY_CARE_PROVIDER_SITE_OTHER): Admitting: Cardiology

## 2024-08-18 VITALS — BP 117/78 | HR 90 | Ht 67.0 in | Wt 219.8 lb

## 2024-08-18 DIAGNOSIS — Z79899 Other long term (current) drug therapy: Secondary | ICD-10-CM

## 2024-08-18 DIAGNOSIS — I10 Essential (primary) hypertension: Secondary | ICD-10-CM

## 2024-08-18 DIAGNOSIS — E782 Mixed hyperlipidemia: Secondary | ICD-10-CM

## 2024-08-18 DIAGNOSIS — I2510 Atherosclerotic heart disease of native coronary artery without angina pectoris: Secondary | ICD-10-CM

## 2024-08-18 DIAGNOSIS — G729 Myopathy, unspecified: Secondary | ICD-10-CM

## 2024-08-18 MED ORDER — CARVEDILOL 12.5 MG PO TABS: 12.5000 mg | ORAL_TABLET | Freq: Two times a day (BID) | ORAL | 3 refills | Status: DC

## 2024-08-18 MED ORDER — ATORVASTATIN CALCIUM 20 MG PO TABS: 20.0000 mg | ORAL_TABLET | Freq: Every day | ORAL | 3 refills | Status: AC

## 2024-08-18 NOTE — Progress Notes (Signed)
 [facility] VISIT    CHIEF COMPLAINT:   Chief Complaint   Patient presents with    Mixed hyperlipidemia     Here for f/u. No cardiac sx.    Medication Problem     Would like to discuss about atorvastatin              HPI:  06/13/2024 Initial Consult  Barbara Rubio is a 73 year old female with osteoarthritis, obesity s/p gastrectomy sleeve in 2021, hypertension and hyperlipidemia who presents for cardiovascular evaluation due to concerns about high LDL levels and family history of heart disease.    She has a history of hypertension and reports taking lisinopril  10 mg daily for several years. Her blood pressure was previously well-controlled at around 120/80 mmHg but has recently increased, which she attributes to stress and pain from arthritis. She has been to multiple doctors recently, including an orthopedic specialist, and received a steroid injection for hip and knee pain, which provided some relief.    She reports a recent onset of pain radiating from her hip to her knee, which led to an emergency room visit. The pain was described as shooting and persistent for several hours. She received a steroid injection from an orthopedic doctor, which helped alleviate the pain. However, she continues to experience knee pain and is on a tapering dose of oral steroids.    She has a history of hyperlipidemia and is currently on atorvastatin  40 mg, which she started about a month and a half ago. She is concerned about her LDL levels, which she reports are high. Patient denies chest pain, lower extremity edema, shortness of breath, palpitations, dizziness, or syncope.  She has a family history of heart disease, with her father and grandmother having died from an enlarged heart.    She underwent bariatric surgery three to four years ago, resulting in significant weight loss from 253 lbs to 175 lbs with weight gain to her current 222lbs.  She is concerned about regaining weight and wants to lose  more weight.    08/18/2024 Follow Up    Patient has been doing well since her last visit.  She does admit to more joint aching, especially in her knees.  She has started the Leqvio infusion.  She has followed up with APP Jimmy Dingess and has been started on Wegovy  for weight loss.  She stopped taking atorvastatin  approximately 2 to 3 days ago because she thought it may be lending to some joint and muscle pain she is experiencing.  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 MRI demonstrates disc degeneration and nerve impingement at L5-S1.    REVIEW OF SYSTEMS: All other systems reviewed and negative except as above.     PAST MEDICAL HISTORY: Medical History[1]She has a past medical history of Abnormal vision (10/23/1998), Arthritis, Constipation, COVID-19 (10/2021), Difficulty walking (05/24/2024), Fibroids, Gastroesophageal reflux disease (10/23/2013), Hypercholesterolemia (03/23/2010), Hyperlipidemia (10/23/2016), Hypertension (07/17/2024), Left wrist fracture (2018), Low back pain (05/24/2024), LUQ pain, Pain in joint (05/24/2024), Psoriatic arthritis (CMS/HCC), and Urinary incontinence (03/23/2017). She has a past surgical history that includes Hysterectomy (03/23/1997); COLONOSCOPY, DIAGNOSTIC (SCREENING); Bunionectomy (Left); EGD (N/A, 05/18/2020); LAPAROSCOPIC GASTRECTOMY SLEEVE WITH HIA (N/A, 07/28/2020); EGD (12/2020); COLONOSCOPY, DIAGNOSTIC (SCREENING) (  N/A, 04/13/2021); and EGD (N/A, 01/24/2022).    SOCIAL HISTORY:  reports that she has never smoked. She has never used smokeless tobacco. She reports that she does not currently use alcohol. She reports that she does not use drugs.    FAMILY HISTORY:  Family History[2]    MEDICATIONS: Current Medications[3]     PHYSICAL EXAMINATION  Health Related Quality of Life: Fair   Vital Signs: BP 117/78 (BP Site: Left arm, Patient Position: Sitting, Cuff Size: Medium)   Pulse 90   Ht 1.702 m (5' 7)   Wt 99.7 kg (219 lb 12.8 oz)   SpO2 96%   BMI 34.43 kg/m    General: Well nourished African American female AOx3 no acute distress  Chest: Clear to auscultation bilaterally, no wheeze, crackles, or rales. Normal respiratory effort.  Cardiovascular: S1 S2 RRR No murmurs, rubs or gallops.   Abdomen: Soft, nontender. No pulsatile masses or bruits.    Extremities: Able to move all extremities without difficulty. Warm without edema. Peripheral pulses are full and equal.  Skin: No evidence of rash. Good skin turgor. No bruising/ecchymoses.  Neuro: Cranial Nvs II-XII grossly intact, abnormal gait, limping with use of cane, no acute deficits  Psych: Normal Affect. No evidence of depression. No suicidal ideation.     ECG: 06/13/2024 normal sinus rhythm at 76 bpm, normal axis and intervals, no significant ST/T changes, normal EKG.     LABS:   Lab Results   Component Value Date    WBC 5.12 05/29/2024    HGB 13.6 05/29/2024    HCT 43.7 05/29/2024    PLT 165 05/29/2024    NA 143 08/12/2024    K 4.4 08/12/2024    MG 1.8 07/29/2020    BUN 11 08/12/2024    CREAT 0.9 08/12/2024    GLU 88 08/12/2024    CHOL 208 (H) 08/12/2024    TRIG 121 08/12/2024    HDL 56 08/12/2024    LDL 130 (H) 08/12/2024    AST 17 08/12/2024    ALT 10 08/12/2024    HGBA1C 5.5 06/09/2020    TSH 1.29 05/29/2024        ECHOCARDIOGRAM:   06/16/2024  Summary    * The left ventricle is normal in size.    * There is concentric left ventricular remodeling.    * Left ventricular systolic function is normal with an ejection fraction by  Biplane Method of Discs of  65 %.    * Left ventricular segmental wall motion is normal.    * There is normal left ventricular diastolic function.    * The right ventricular cavity size is  normal in size.    * Normal right ventricular systolic function.    * No significant valvular dysfunction.    * No pulmonary hypertension with estimated right ventricular systolic  pressure of  25 mmHg.    * The left atrium is normal in size.    * The right atrium is normal in size.    * The aortic root is normal in size at 3.4 cm in diameter.    * The ascending aorta is normal in size at 3.2 cm in diameter.    * The IVC is normal in size with > 50% respiratory variance consistent with  normal RA pressure of 3 mmHg.    * No pericardial effusion visualized.    * No prior study is available for comparison.    STRESS TEST:  MONITOR:    XR CHEST:  8/0/2025  FINDINGS: PA and lateral views of the chest were obtained.   The heart is normal in size.   The  mediastinum and hilar structures are unremarkable.   The lung fields demonstrates mild increase in interstitial densities in the  right perihilar and anterior segment of the right midlung.   There is no pleural effusion.  No pneumothorax.  The visualized osseous structures demonstrate no acute abnormality.   IMPRESSION:    New interstitial infiltrates in the perihilar and right  midlung. Findings are consistent with a bronchitis or viral pneumonitis. No  consolidation or pleural effusion    10/12/2021  FINDINGS:   The heart is normal in size. The aorta is atherosclerotic and unfolded. The  pulmonary vascular pattern is unremarkable.  There is minimal left basilar  atelectasis, lungs are otherwise clear. There is no effusion. There is no  pneumothorax.   IMPRESSION:    Basilar atelectasis, otherwise unremarkable chest examination.    IMPRESSION/RECOMMENDATIONS:   Essential/Primary Hypertension  Long-standing hypertension with suspected mild chronic kidney disease due to fluctuating kidney function and elevated creatinine.   --Blood pressure still not at goal based on home readings and prior clinic readings. Today's clinic reading I believe to be an anomaly  - Stopped  lisinopril  at prior visit  - Started carvedilol  6.25 mg twice daily with food. Increase to 12.5mg  twice a day based on home readings. Patient to purchase new blood pressure cuff and current one may be damaged.    - Monitor blood pressure daily at varying times for another two weeks.  - Skip carvedilol  if systolic BP <100 mmHg or HR <55 bpm.  - Echocardiogram normal. There is mild evidence of thickening of the heart likely due to hypertension. There is no wall motion abnormalities, no significant valvular disease and no pulmonary hypertension. LVEF normal at 65%      Hyperlipidemia  Hyperlipidemia with elevated LDL, possible genetic predisposition. On atorvastatin  40 mg.  -Having a lot of joint pain. Statin may be exacerbating already existing knee and back issues  -Reduce atorvastatin  dose to 20mg  daily  -ASCVD 10-year risk score 29.4%, which is considered high risk  -LDL above goal. Started Leqvio at [facility] infusion    -Discussed further life style modification such as reducing saturated fats (reduction in fatty meats and full fat dairy, elimination of trans fats, increasing foods rich in Omega 3 fatty acids (salmon, mackerel, walnuts, flaxseeds), increasing soluble fiber. In addition exercising most days of the week for at least 30 minutes (brisk walking, riding bike, playing a sport. Losing weight as well as drinking in moderation.  Increasing dietary fiber      Obesity BMI: 34 kg/m2  -s/p gastrectomy sleeve with weight regain.  -Would like to explore use of GLP-1 agonist  -Has seen Jimmy Dingess and has been started on Wegovy .    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   Would seriously consider surgical solutions.  Recommend second opinion    F/U in 6 months.    Thank you for involving me in Ms. Simonetti's care. A copy of this note will be sent to the referring provider.         I saw and examined the patient  and spent 47 minutes providing care on the day of service including 10 minutes spent in chart and data review, 10 minites documentation,  ordering, and 27 minutes speaking with the patient and her daughter over the phone.    Milo JINNY Babe, MD MPH [facility]    [facility] [place]  [phone] [facility] [address]  [address]  [place], [place] [address]  Phone - [phone]  Fax - [phone]            [1]   Past Medical History:  Diagnosis Date    Abnormal vision 10/23/1998    wears glasses    Arthritis     Constipation     HX - HAS BEEN MORE PREVELANT POST BARIATRIC SURGERY.    COVID-19 10/2021    HX - 21//23 URGENT CARE NOTE IN EPIC.    Difficulty walking 05/24/2024    Due to nerve pinched in spine & 2-3-4 lumbar in back spine area    Fibroids     PT HAD A PARTIAL HYSTERECTOMY - OVARIES ARE STILL IN PLACE.    Gastroesophageal reflux disease 10/23/2013    HX.    Hypercholesterolemia 03/23/2010    High Bad Cholesterol Level    Hyperlipidemia 10/23/2016    MANAGED WITH MEDS.    Hypertension 07/17/2024    PT STATES HE BP RUNS FROM 128-130/70s. MANAGED WITH MEDS - FOLLOWED BY PCP.    Left wrist fracture 2018    HX    Low back pain 05/24/2024    Pinched nerve & arthritis    LUQ pain     HX    Pain in joint 05/24/2024    Left knee, thigh and leg    Psoriatic arthritis (CMS/HCC)     HX - LOWER BACK, LEFT HAND, WRIST AND FINGERS    Urinary incontinence 03/23/2017    HX OVERACTIVE BLADDER. ISSUE RESOLVED POST BARIATRIC SURGERY - PT STATED SHE LOST 65 LBS.   [2]   Family History  Problem Relation Name Age of Onset    Hypertension Mother Tommy Snell         Mother was in her 30s when she had high blood pressure. now deceased    Arthritis Mother Tommy Snell         Had psoreactic arthristic    Obesity Mother Tommy Snell         Mother was obese w.BMI over 38- 40    Diabetes Mother Tommy Snell 49 - 40    Hypertension Father Todd Shed         Father was in his 83s when diagonsis with high blood pressure. now deceased    Arthritis Father  Todd Shed         had psoreactic arthristic    Heart disease Father Todd Shed     Heart failure Father Todd Shed 49 - 40    Hypertension Sister      Hypertension Sister Alfonse Caldron         Had high BPressure in her early 68s now deceased    Hypertension Brother Lynwood Howell in his 77s with HB Pressure    Stroke Brother Lynwood 39 - 30    Hypertension Maternal Grandfather Drue Snell         Not sure of onset age but know he had High BP 60-86 yrs old    Heart disease Paternal Grandmother      Migraines Neg Hx      Seizures Neg Hx     [3]   Current Outpatient Medications:     carvedilol  (COREG ) 6.25 MG  tablet, Take 1 tablet (6.25 mg) by mouth 2 (two) times daily with meals, Disp: 180 tablet, Rfl: 1    estradiol  (ESTRACE ) 0.1 MG/GM vaginal cream, Place 0.5 g vaginally twice a week, Disp: , Rfl:     Inclisiran Sodium (LEQVIO SC), Inject into the skin every 6 (six) months, Disp: , Rfl:     Myrbetriq 50 MG Tablet SR 24 hr, Take 1 tablet (50 mg) by mouth once daily, Disp: , Rfl:     Wegovy  0.25 MG/0.5ML injection, Inject 0.5 mLs (0.25 mg) into the skin once a week, Disp: 2 mL, Rfl: 0    atorvastatin  (LIPITOR) 40 MG tablet, Take 1 tablet (40 mg) by mouth once daily (Patient not taking: Reported on 08/18/2024), Disp: 90 tablet, Rfl: 1

## 2024-08-18 NOTE — PT/OT Therapy Note (Signed)
 Name: Barbara Rubio Age: 73 y.o. Date of Service: 08/19/2024  Referring Physician: Tobie Hue, MD  Date of Injury: 05/24/2024  Plan of Care Dates From:07/15/2024 To: 10/12/2024  Date Treatment Started: 07/15/2024    Sessions in Plan of Care: 24       Visit Count: 4   Diagnosis:    Diagnosis ICD-10-CM Associated Order   1. Other low back pain  M54.59       2. Left leg pain  M79.605       3. Radiculopathy, lumbar region  M54.16           Pertinent PMHx:  onset LBP w/L LE radic 05/24/24, insidious onset (woke w/pain L LE). Symptoms may have been triggered by sitting in bed the weekend prior working on computer. Symptoms have been progressively worsening over time.     MRI results:  MRI LEFT KNEE WITHOUT CONTRAST  HISTORY: Left knee pain for several years. History of arthritis.  COMPARISON: None.  TECHNIQUE: MRI of the left knee performed on a 1.5T scanner without  intravenous contrast.  CONTRAST: None.  FINDINGS:  Menisci:   Lateral meniscus: There is a degeneration and partial peripheral  longitudinal tear present within the anterior horn.     Medial meniscus: There is a chronic tear with moderate to high-grade  diffuse attenuation of the posterior horn, and moderate attenuation and  moderate to advanced extrusion of the body with intrasubstance signal  changes.     Ligaments: The ACL and PCL are intact. There is a chronic partial tear of  the MCL proximally as demonstrated by thickening and intermediate signal  alteration. The LCL complex is intact.     Muscles/Tendons: There is mild tendinosis of the quadriceps tendon. No  myositis demonstrated.     Cartilage: No osteochondral lesion demonstrated. Tricompartment  degenerative changes most significant at the medial tibiofemoral  compartment where there are focal high-grade cartilage defects with minimal  subchondral edema and tiny subchondral cysts. There is a small focal  high-grade cartilage defect at the lateral femoral condyle.     Bone: No acute fracture  demonstrated.  No suspicious marrow signal  abnormality demonstrated.       Joint: There is trace joint effusion. No loose body is identified.     IMPRESSION:      1.  Chronic tear of the medial meniscus with moderate to high-grade  attenuation of the posterior horn and extrusion of the body.     2.  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     3.  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     4.  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     5.  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     6.  Mild tendinosis of the quadriceps tendon.     Electronically signed by: Derick Daunt M.D.  [facility]  AB: 08/15/24       Subjective     Daily Subjective   Pt reports L knee and groin pain. LB and knee pain wake her up at night.  Had an MRI for L knee and back.  Saw pain MD yesterday.  Nov 4th will receive steroid inj to LB.  Was given a back brace for support.  Will be travelling to [place]  for 4 days - leaves on Thursday.      Social Support/Occupation  Lives in: one story house    Lives with: alone    Occupation: [employer] - currently not working           Precautions: No data was found  Allergies: Patient has no known allergies.    Objective    Outcomes:  Goal: 51 Initial  07/15/2024  08/19/24               Primary Functional Status Measure 29  29         Oswestry 13.4  13.4          Pain: /10 /10 /10 /10 /10   Pain last 24 hrs 7  8         Least pain last 30 days 6  7         Greatest pain last 30 days 7  9         PSFS Activities: /10 /10 /10 /10 /10   1. walk 3 6          2. Get Dressed, [employer] 4  6         3. Sleep at night..Pain intensify while in bed sleeping.Wake me up every night@3 :00 am  2  6           Lumbar AROM  Initial   08/19/24              Flexion  55           Extension  0           Side Bend R  20 p! LB            Side Bend L  25           Rotation R  WNL           Rotation L  WNL p! L groin           (blank fields were intentionally left blank)     LE Strength  (lbs) Initial  07/15/2024   R Initial  07/15/2024    L    R    L    R    L    R    L    R    L   Hip Flex Seated (L1/2) 16.4 9.0                   Hip Flex Supine (L1/2)                       Hip Ext Knee Flex                       Hip Ext Knee Ext                       Hip Abd 3.5 0                   Hip Add                       Hip IR                       Hip ER                       Quads (L3) 29.8 13.6  HS Seated (S1) 20 13.6                   HS Prone (S1)                       Ankle DF (L4)                       Ankle PF                       Toe Ext (L5)                       (blank fields were intentionally left blank)      Flexibility  Initial R    Initial L    R L R L   Hamstrings               Quads               Gastrocs               (blank fields were intentionally left blank)     Balance (sec) Initial               SLS R: Eyes Open (EO):             SLS R: Eyes Closed (EC):             SLS L: Eyes Open (EO):             SLS L: Eyes Closed (EC):             (blank fields were intentionally left blank)                             Treatment     Therapeutic Exercises - Justified to address any of the following:  To develop strength, endurance, ROM and/or flexibility.   recumbent bike seat 10 L3.5 x 6 min w/subjective discussion and to prepare for session.     Foto and lumbar ROM re-assessment w/discussion of results - see above      Added: unilateral HS curls 25# 2 x 10 each with cues to control eccentric phase    Added: unilateral knee ext 25# 2 x 10 w/cues to control eccentric phase    Added: seated resisted hip IR/ER w/green TB x 10 each side w/cues to keep thigh and buttock on table        Neuromuscular Re-Education - Justified to address any of the following:   Re-education of movement, balance, coordination, kinesthetic  sense, posture and/or proprioception for sitting and/or standing activities.             Home Exercises   Access Code: [email or web address]  URL: [email or web address]  Date: 08/13/2024  Prepared by: Avelina Starks    Exercises  - Supine Transversus Abdominis Bracing - Hands on Stomach  - 2 x daily - 7 x weekly - 1 sets - 10 reps - 10 sec hold  - Supine Sciatic Nerve Glide  - 5 x daily - 7 x weekly - 1 sets - 30 reps  - Supine Heel Slide (Mirrored)  - 2 x daily - 7 x weekly - 2 sets - 10 reps  - Supine Lower Trunk Rotation  - 2 x daily - 7 x weekly - 1 sets - 20 reps    ---  Flowsheet Row ---   Total Time    Timed Minutes 38 minutes   Total Time 38 minutes       Assessment      Lumbar ROM deficits noted esp ext however no rise in pain except w/R SB.  Pt able to tolerate hip and knee strengthening today w/appropriate fatigue however no rise in symptoms.    Patient requires continued skilled care to: improve ROM, strength and stability so pt can return to PLOF  Plan   Continue with POC  Progress hip/knee strengthening/stability as tol      Goals      Goal 1: Patient will demonstrate independence in prescribed initial HEP with proper form, sets and reps for safe completion in conjunction with PT POC     Sessions: 6      Goal 2: Improve Physical FS Primary Measure from 29 to 51 or better to demonstrate change for significant functional improvement.     08/19/24: no change (29)  pk     Sessions: 24      Goal 3: Patient will demonstrate independence in prescribed HEP with proper form, sets and reps for safe discharge to an independent program.   Sessions: 24      Goal 4: Increase knee ext strength to 40 lb to allow patient to transfer sit <-> stand from a regular toilet seat and chair.   Sessions: 24          Goal 5: Increase hip abduction and extensor strength to 20 lb to allow for ambulation > 20 minutes.   Sessions: 24                             [name], PT

## 2024-08-19 ENCOUNTER — Inpatient Hospital Stay: Admitting: Rehabilitative and Restorative Service Providers"

## 2024-08-19 DIAGNOSIS — M79605 Pain in left leg: Secondary | ICD-10-CM

## 2024-08-19 DIAGNOSIS — M5459 Other low back pain: Secondary | ICD-10-CM

## 2024-08-19 DIAGNOSIS — M5416 Radiculopathy, lumbar region: Secondary | ICD-10-CM

## 2024-08-22 ENCOUNTER — Encounter (INDEPENDENT_AMBULATORY_CARE_PROVIDER_SITE_OTHER): Payer: Self-pay | Admitting: Student in an Organized Health Care Education/Training Program

## 2024-08-24 NOTE — Telephone Encounter (Signed)
 Prescription Refill Checklist    Completed Process Reviewed Notes   [x]  Verified med on pt chart. Wegovy  0.25 MG/0.5ML injection    [x]  2.  Reviewed patient allergies    [x]  3.  Review last encounters since OV 08/18/2024   [x]  4.  Determine if pt has f/u as instructed or needs OV 01/07/2025   []  5.  If OV required, pend 90 day supply of medication    []  6.  Review labs are up to date.    []  7. Filled by RN 90 day x 0 May only be done 1 x yearly   DATE:       Obesity BMI: 34 kg/m2  -s/p gastrectomy sleeve with weight regain.  -Would like to explore use of GLP-1 agonist  -Has seen Jimmy Dingess and has been started on Wegovy .  Reviewed -  AF

## 2024-08-25 ENCOUNTER — Encounter (INDEPENDENT_AMBULATORY_CARE_PROVIDER_SITE_OTHER): Payer: Self-pay | Admitting: Student in an Organized Health Care Education/Training Program

## 2024-08-26 ENCOUNTER — Ambulatory Visit (INDEPENDENT_AMBULATORY_CARE_PROVIDER_SITE_OTHER): Admitting: Student in an Organized Health Care Education/Training Program

## 2024-08-26 ENCOUNTER — Telehealth (INDEPENDENT_AMBULATORY_CARE_PROVIDER_SITE_OTHER): Payer: Self-pay

## 2024-08-26 ENCOUNTER — Encounter (INDEPENDENT_AMBULATORY_CARE_PROVIDER_SITE_OTHER): Payer: Self-pay | Admitting: Student in an Organized Health Care Education/Training Program

## 2024-08-26 ENCOUNTER — Ambulatory Visit (INDEPENDENT_AMBULATORY_CARE_PROVIDER_SITE_OTHER)

## 2024-08-26 VITALS — BP 134/85 | HR 79

## 2024-08-26 DIAGNOSIS — M23242 Derangement of anterior horn of lateral meniscus due to old tear or injury, left knee: Secondary | ICD-10-CM

## 2024-08-26 DIAGNOSIS — M1712 Unilateral primary osteoarthritis, left knee: Secondary | ICD-10-CM

## 2024-08-26 DIAGNOSIS — G8929 Other chronic pain: Secondary | ICD-10-CM

## 2024-08-26 DIAGNOSIS — M25562 Pain in left knee: Secondary | ICD-10-CM

## 2024-08-26 DIAGNOSIS — M23204 Derangement of unspecified medial meniscus due to old tear or injury, left knee: Secondary | ICD-10-CM

## 2024-08-26 MED ORDER — TRIAMCINOLONE ACETONIDE 40 MG/ML IJ SUSP
80.0000 mg | Freq: Once | INTRAMUSCULAR | Status: AC
Start: 2024-08-26 — End: 2024-08-26
  Administered 2024-08-26: 80 mg via INTRA_ARTICULAR

## 2024-08-26 MED ORDER — MELOXICAM 15 MG PO TABS
15.0000 mg | ORAL_TABLET | Freq: Every day | ORAL | 0 refills | Status: AC
Start: 2024-08-26 — End: 2024-09-25

## 2024-08-26 NOTE — Progress Notes (Signed)
 Orthopedic Surgery New Patient Clinic Visit    Chief Complaint     Chief Complaint   Patient presents with    Follow-up     L knee pain        History of Present Illness:     History of Present Illness  Barbara Rubio is a 73 year old female with spondylolisthesis of the lumbar spine who presents with left knee pain. She was referred by her pain doctor for evaluation of her left knee pain.    She experiences ongoing left knee pain, primarily located behind the kneecap, which worsens with weight-bearing activities such as standing and walking. The pain is tolerable when sitting but becomes significant upon standing and putting weight on the knee, making walking difficult.    In September, she was evaluated for left leg pain and diagnosed with spondylolisthesis of the lumbar spine. She received an epidural steroid injection at a pain clinic, which provided some relief but did not resolve her knee pain. She was told by her pain doctor that her MRI may show a torn meniscus, but she has not undergone any physical therapy or received injections specifically for her knee pain.    She is currently managing her knee pain with ibuprofen , CBD cream, and Biofreeze. She has not yet tried physical therapy for her knee. She is also undergoing physical therapy for her back and has a good relationship with her physical therapist.    No pain when sitting, but significant pain when standing and weight-bearing, particularly behind the kneecap.       From 07/08/24: Her symptoms have been present for approximately 6 weeks and have been worsening over time.  She was seen regarding this issue at the [facility] ED on 05/24/2024 where x-rays were obtained and noted to be negative for any acute fractures.  She was discharged with a prescription of Flexeril  and Medrol Dosepak.  She notes hip pain that is primarily localized to the lower back. The pain is constant in nature. The patient also endorses pain that radiates down her left  leg all the way to her ankle and associated numbness/paresthesias. She denies groin pain at this time. No recent injury/trauma. The pain temporarily improves with OTC pain relievers and is exacerbated by walking, standing, and resting at nighttime.  She was seen regarding this issue by 2 external providers.  The first provider gave her a cortisone injection for her left hip which did not help her at all.  The second provider ordered an MRI of her lumbar spine and recommended an injection for her lower back.  She presents fo a third opinion today and to further discuss treatment options.     Clinical interventions thus far for her left knee:  HEP: no   PT: no   Medications: yes --  ibuprofen   Injections: None  Ambulatory aid: Cane    Past Hip or Knee surgery: None    Medical Hx: Hypertension, hyperlipidemia.  Opioid Use: no current use  Smoking Status: Non-smoker    Past Medical History:   Past Medical History:   Diagnosis Date    Abnormal vision 10/23/1998    wears glasses    Arthritis     Constipation     HX - HAS BEEN MORE PREVELANT POST BARIATRIC SURGERY.    COVID-19 10/2021    HX - 21//23 URGENT CARE NOTE IN EPIC.    Difficulty walking 05/24/2024    Due to nerve pinched in spine & 2-3-4  lumbar in back spine area    Fibroids     PT HAD A PARTIAL HYSTERECTOMY - OVARIES ARE STILL IN PLACE.    Gastroesophageal reflux disease 10/23/2013    HX.    Hypercholesterolemia 03/23/2010    High Bad Cholesterol Level    Hyperlipidemia 10/23/2016    MANAGED WITH MEDS.    Hypertension 07/17/2024    PT STATES HE BP RUNS FROM 128-130/70s. MANAGED WITH MEDS - FOLLOWED BY PCP.    Left wrist fracture 2018    HX    Low back pain 05/24/2024    Pinched nerve & arthritis    LUQ pain     HX    Pain in joint 05/24/2024    Left knee, thigh and leg    Psoriatic arthritis (CMS/HCC)     HX - LOWER BACK, LEFT HAND, WRIST AND FINGERS    Urinary incontinence 03/23/2017    HX OVERACTIVE BLADDER. ISSUE RESOLVED POST BARIATRIC SURGERY - PT STATED  SHE LOST 65 LBS.       Past Surgical History:   Past Surgical History[1]    Social History:   Social History[2]     Current Medications:  Current Medications[3]    Allergies:   Allergies[4]     Physical Examination:     Vitals (last filed):   BP 134/85 (BP Site: Left arm, Patient Position: Sitting)   Pulse 79     BMI:   Estimated body mass index is 34.43 kg/m as calculated from the following:    Height as of 08/18/24: 1.702 m (5' 7).    Weight as of 08/18/24: 99.7 kg (219 lb 12.8 oz).     Exam:  General: NAD. Patient is alert and oriented. The patient is in no apparent distress.  CV: Extremity wwp  Resp: Nonlabored breathing, breathing comfortably on RA    Examination of the Left Knee:   There is no evidence of previous incisions.  There is no effusion.   ROM 0 to  110.    There is no varus or valgus instability througout the ROM.    The patient is otherwise neurologically intact       Laboratory / Ancillary Data:   Data Review:  Lab Results   Component Value Date    WBC 5.12 05/29/2024    HGB 13.6 05/29/2024    HCT 43.7 05/29/2024    MCV 87.8 05/29/2024    PLT 165 05/29/2024     Lab Results   Component Value Date    NA 143 08/12/2024    K 4.4 08/12/2024    CO2 25 08/12/2024    CL 109 08/12/2024    BUN 11 08/12/2024     Lab Results   Component Value Date    HGBA1C 5.5 06/09/2020       Imaging:   New Xrays were obtained in clinic today and reviewed.  AP, lateral, PA flexed and sunrise  X-ray views of left knee(s) demonstrate severe joint space narrowing of the medial and lateral compartment with osteophyte formation, consistent with moderate osteoarthritic changes. No other acute pathology is noted.  Impression: Kellgren Lawrence Grade 4 osteoarthritis of left knee       IMPRESSION:   Chronic tear of the medial meniscus with moderate to high-grade attenuation of the posterior horn and extrusion of the body.   Moderate medial tibiofemoral osteoarthritis with high-grade cartilage defects and tiny subchondral  cysts.  Chronic peripheral longitudinal tear of the lateral meniscus within the anterior horn.  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  Mild tendinosis of the quadriceps tendon.    Procedures:   Procedure Note: Left knee intra-articular corticosteriod injection     After discussing the risks, benefits and alternative treatments to steroid injections to the patient in detail, a verbal informed consent was obtained. The injection site was identified using anatomic landmarks, and the skin was prepped using chlorhexidine solution. Ethyl chloride was used for topical anesthesia.  A 22-gauge needle was introduced intraarticularly and 4cc of 1% Lidocaine , and 80mg  of Kenalog were injected into the knee. The needle was removed, the skin was cleaned and band-aid placed over the injection site. The patient was stable throughout and tolerated the procedure well. There were no immediate complications. Home instructions were reviewed.     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 MRI confirms meniscal tears and severe arthritis. Will start with conservative management   - Administered cortisone injection to the left knee for pain relief, expected to last up to three months.  - Initiated physical therapy for the left knee.  - Prescribed meloxicam once daily with food for pain management.  - Discussed potential for gel injection if cortisone is ineffective.  - Will consider knee replacement surgery if conservative measures fail and pain persists.  - Follow-up: 43mo for repeat evaluation     Left lumbar radiculopathy  - Continue follow-up with pain management for ESI.  - Follow-up with Dr. Rosalia for further evaluation if ESIs do not improve her symptoms    Left Hip Arthritis   - She has known left hip arthritis.   - Left knee is  bothering her more at this time so we will focus on that for now.    -All questions and concerns were answered in the office today and they are to call the office in the interim if they have any questions.     ---------------------------------------------------------------------------------------------------------------  The review of the patient's medications does not in any way constitute an endorsement, by this clinician, of their use, dosage, indications, route, efficacy, interactions, or other clinical parameters.    Please pardon any potential grammatical errors or typos as aspects of this note may have been created through speech-to-text software.      Attestation:     I have seen and personally examined the patient, reviewed and personally interpreted the images pertinent for treatment decision, and am the responsible party for the plan and clinical decisions.  One or more parties may have aided in the documentation of this patient, and I have personally reviewed this work and attest it to be true to the best of my knowledge.    Signed:  Camie Blanch, MD  10:22 AM          [1]   Past Surgical History:  Procedure Laterality Date    BUNIONECTOMY Left     COLONOSCOPY, DIAGNOSTIC (SCREENING)      X 2     COLONOSCOPY, DIAGNOSTIC (SCREENING) N/A 04/13/2021    Procedure: COLONOSCOPY;  Surgeon: Ulysess Foots, MD;  Location: [id number];  Service: Gastroenterology;  Laterality: N/A;    EGD N/A 05/18/2020    Procedure: EGD w/ bx's;  Surgeon: Marcey Sammi BIRCH, MD;  Location: [facility];  Service: Gastroenterology;  Laterality: N/A;    EGD  12/2020    EGD  N/A 01/24/2022    Procedure: EGD w/ Bx's;  Surgeon: Marcey Sammi BIRCH, MD;  Location: [facility];  Service: Gastroenterology;  Laterality: N/A;    HYSTERECTOMY  03/23/1997    PARTIAL - OVARIES IN PLACE.    LAPAROSCOPIC GASTRECTOMY SLEEVE WITH HIATAL HERNIA N/A 07/28/2020    Procedure: LAPAROSCOPIC GASTRECTOMY SLEEVE WITH HIATAL HERNIA, OMENTOPEXY,  LAPAROSCOPIC RESECTION OF ANTRAL AND FUNDIC GASTRIC MASS;  Surgeon: Eliberto Oris SAUNDERS, DO;  Location: [facility];  Service: General;  Laterality: N/A;   [2]   Social History  Tobacco Use    Smoking status: Never    Smokeless tobacco: Never    Tobacco comments:     Never smoked or used tobacco   Vaping Use    Vaping status: Never Used   Substance Use Topics    Alcohol use: Yes     Alcohol/week: 0.0 - 1.0 standard drinks of alcohol     Comment: 1-2 DRINKS MONTHLY    Drug use: Never   [3]   Current Outpatient Medications   Medication Sig Dispense Refill    atorvastatin  (LIPITOR) 20 MG tablet Take 1 tablet (20 mg) by mouth once daily 90 tablet 3    carvedilol  (COREG ) 12.5 MG tablet Take 1 tablet (12.5 mg) by mouth 2 (two) times daily with meals Hold for SBP<179mmHg or heart rate less than 55 bpm 180 tablet 3    estradiol  (ESTRACE ) 0.1 MG/GM vaginal cream Place 0.5 g vaginally twice a week      Inclisiran Sodium (LEQVIO SC) Inject into the skin every 6 (six) months      Myrbetriq 50 MG Tablet SR 24 hr Take 1 tablet (50 mg) by mouth once daily      Wegovy  0.25 MG/0.5ML injection INJECT 0.5 MLS (0.25 MG) SUBCUTANEOUSLY ONCE A WEEK 4 mL 0     No current facility-administered medications for this visit.   [4] No Known Allergies

## 2024-08-26 NOTE — Telephone Encounter (Signed)
 Pt seen today for L knee pain. Pt reports she went to pharmacy and pharmacist would like her to verify with provider if she'd still like pt to proceed with Meloxicam despite being on Coreg . Pt reports her BP is managed with this medication and she checks her BP daily.     MD messaged.    Called pt, let her know ok per provider to continue. Pt informed to monitor BP and call if she has any concerns. Pt verbalized understanding.

## 2024-08-30 ENCOUNTER — Encounter (INDEPENDENT_AMBULATORY_CARE_PROVIDER_SITE_OTHER): Payer: Self-pay

## 2024-09-01 ENCOUNTER — Other Ambulatory Visit (INDEPENDENT_AMBULATORY_CARE_PROVIDER_SITE_OTHER)

## 2024-09-03 ENCOUNTER — Inpatient Hospital Stay: Admitting: Rehabilitative and Restorative Service Providers"

## 2024-09-03 ENCOUNTER — Telehealth (INDEPENDENT_AMBULATORY_CARE_PROVIDER_SITE_OTHER): Payer: Self-pay

## 2024-09-03 NOTE — Telephone Encounter (Signed)
 Spoke to pt who expressed she is discouraged and concerned that she has not had relief from the steroid injection administered on 11/4, she states her pain has been worse. Reviewed the plan from visit - conservative tx x3 months, then f/u - PT, mobic, and HA is an option, finally sx if conservative measures fail. Pt states she d/c'd the Mobic attributing elevated BP readings to med, informed cause likely the steroid injection, can raise for up to a week, but ok to d/c. She reports doing PT exercises a home, states the pain is too great to go out of the house, she can't wait 3 months. She expressed she was fearful to f/u prior to 3 months, thinking she would be told there are no other tx options. Spoke to Dr Tobie, who agreed pt can come in to see her sooner to discuss next steps. Pt reports felt encouraged that there may be other tx options. Pt informed to schedule a sooner appt to be seen in clinic.

## 2024-09-03 NOTE — Telephone Encounter (Signed)
 Patient called, got a cortisone shot last visit and said she is in pain, worse that she was before this shot was given.  She said she has been in pain for the past 3 months. She wanted to know if we have a doctor that could do MACO plasty to help her with her pain. I informed her that I will let Dr Tobie S know about her concern but she declines.   I informed her that all our Joint doctors can but will inquire more.  I apologized for her pain and I informed her that I will consult with the team and we will get back to her with someone on the team that could assist her with her request, voiced understanding and appreciation.

## 2024-09-05 ENCOUNTER — Ambulatory Visit (INDEPENDENT_AMBULATORY_CARE_PROVIDER_SITE_OTHER): Admitting: Cardiology

## 2024-09-08 NOTE — PT/OT Therapy Note (Signed)
 Name: Barbara Rubio Age: 73 y.o. Date of Service: 09/09/2024  Referring Physician: Tobie Hue, MD  Date of Injury: 05/24/2024  Plan of Care Dates From:07/15/2024 To: 10/12/2024  Date Treatment Started: 07/15/2024    Sessions in Plan of Care: 24       Visit Count: 5   Diagnosis:    Diagnosis ICD-10-CM Associated Order   1. Other low back pain  M54.59       2. Left leg pain  M79.605       3. Radiculopathy, lumbar region  M54.16             Pertinent PMHx:  onset LBP w/L LE radic 05/24/24, insidious onset (woke w/pain L LE). Symptoms may have been triggered by sitting in bed the weekend prior working on computer. Symptoms have been progressively worsening over time.     MRI results:  MRI LEFT KNEE WITHOUT CONTRAST  HISTORY: Left knee pain for several years. History of arthritis.  COMPARISON: None.  TECHNIQUE: MRI of the left knee performed on a 1.5T scanner without  intravenous contrast.  CONTRAST: None.  FINDINGS:  Menisci:   Lateral meniscus: There is a degeneration and partial peripheral  longitudinal tear present within the anterior horn.     Medial meniscus: There is a chronic tear with moderate to high-grade  diffuse attenuation of the posterior horn, and moderate attenuation and  moderate to advanced extrusion of the body with intrasubstance signal  changes.     Ligaments: The ACL and PCL are intact. There is a chronic partial tear of  the MCL proximally as demonstrated by thickening and intermediate signal  alteration. The LCL complex is intact.     Muscles/Tendons: There is mild tendinosis of the quadriceps tendon. No  myositis demonstrated.     Cartilage: No osteochondral lesion demonstrated. Tricompartment  degenerative changes most significant at the medial tibiofemoral  compartment where there are focal high-grade cartilage defects with minimal  subchondral edema and tiny subchondral cysts. There is a small focal  high-grade cartilage defect at the lateral femoral condyle.     Bone: No acute fracture  demonstrated.  No suspicious marrow signal  abnormality demonstrated.       Joint: There is trace joint effusion. No loose body is identified.     IMPRESSION:      1.  Chronic tear of the medial meniscus with moderate to high-grade  attenuation of the posterior horn and extrusion of the body.     2.  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     3.  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     4.  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     5.  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     6.  Mild tendinosis of the quadriceps tendon.     Electronically signed by: Derick Daunt M.D.  [facility]  AB: 08/15/24       Subjective     Daily Subjective   Pt reports continued L knee and hip pain which limits ability to bear weight on L LE. Received injection in L knee last week which provided some relief of knee pain however pain intensified a few days later and has not abated. Pt is feeling very frustrated. Reports compliance w/HEP.      Social Support/Occupation    Lives in: one story house  Lives with: alone    Occupation: [employer] - currently not working           Precautions: No data was found  Allergies: Patient has no known allergies.    Objective    Outcomes:  Goal: 51 Initial  07/15/2024  08/19/24               Primary Functional Status Measure 29  29         Oswestry 13.4  13.4          Pain: /10 /10 /10 /10 /10   Pain last 24 hrs 7  8         Least pain last 30 days 6  7         Greatest pain last 30 days 7  9         PSFS Activities: /10 /10 /10 /10 /10   1. walk 3 6          2. Get Dressed, [employer] 4  6         3. Sleep at night..Pain intensify while in bed sleeping.Wake me up every night@3 :00 am  2  6           Lumbar AROM  Initial   08/19/24              Flexion  55           Extension  0           Side Bend R   20 p! LB           Side Bend L  25           Rotation R  WNL           Rotation L  WNL p! L groin           (blank fields were intentionally left blank)     LE Strength  (lbs) Initial  07/15/2024   R Initial  07/15/2024    L    R    L    R    L    R    L    R    L   Hip Flex Seated (L1/2) 16.4 9.0                   Hip Flex Supine (L1/2)                       Hip Ext Knee Flex                       Hip Ext Knee Ext                       Hip Abd 3.5 0                   Hip Add                       Hip IR                       Hip ER                       Quads (L3) 29.8 13.6  HS Seated (S1) 20 13.6                   HS Prone (S1)                       Ankle DF (L4)                       Ankle PF                       Toe Ext (L5)                       (blank fields were intentionally left blank)      Flexibility  Initial R    Initial L    R L R L   Hamstrings               Quads               Gastrocs               (blank fields were intentionally left blank)     Balance (sec) Initial               SLS R: Eyes Open (EO):             SLS R: Eyes Closed (EC):             SLS L: Eyes Open (EO):             SLS L: Eyes Closed (EC):             (blank fields were intentionally left blank)                             Treatment     Therapeutic Exercises - Justified to address any of the following:  To develop strength, endurance, ROM and/or flexibility.   Subjective discussion    Foto  re-assessment w/discussion of results - see above    Instructed pt in the following to add to HEP:  - seated knee hang w/10# ankle wt x 3-5 min   - standing on step L hip swings w/10# ankle weight              Neuromuscular Re-Education - Justified to address any of the following:   Re-education of movement, balance, coordination, kinesthetic sense, posture and/or proprioception for sitting and/or standing activities.             Manual Therapy - Justified to address any of the following:    Mobilization of joints and soft  tissues, manipulation, manual lymphatic drainage, and/or manual traction.    Seated on edge of plinth:  Inf tibial distraction w/PA proximal tib mobs grade III    Supine:  Lateral and inf/lat L hip mobs grade III w/strap  Long leg distraction L hip w/strap grade III    Self-Care/Home Management Training - Focus on activities of daily living (ADL) and compensatory training for ADLs (dressing, bathing, toileting, food prep/cooking), safety procedures/home environment education, and instructions in the use of adaptive equipment and assistive technology for use in the home environment.  Includes training of patient or caregiver on strategies that are not covered in other CPT codes.  Extensive discussion regarding knee and hip symptoms and benefit of knee injection for diagnostic purposes. Discussed possible temporary  use of rolling walker to promote more upright gait and to facilitate mobility.    Home Exercises   Access Code: [email or web address]  URL: [email or web address]  Date: 08/13/2024  Prepared by: Avelina Starks    Exercises  - Supine Transversus Abdominis Bracing - Hands on Stomach  - 2 x daily - 7 x weekly - 1 sets - 10 reps - 10 sec hold  - Supine Sciatic Nerve Glide  - 5 x daily - 7 x weekly - 1 sets - 30 reps  - Supine Heel Slide (Mirrored)  - 2 x daily - 7 x weekly - 2 sets - 10 reps  - Supine Lower Trunk Rotation  - 2 x daily - 7 x weekly - 1 sets - 20 reps    ---      Flowsheet Row ---   Total Time    Timed Minutes 45 minutes   Total Time 45 minutes         Assessment      Decreased subjective c/o at end of session however weightbearing intensifies symptoms.    Patient requires continued skilled care to: improve ROM, strength and stability so pt can return to PLOF  Plan   Continue with POC  Progress hip/knee strengthening/stability as tol      Goals      Goal 1: Patient will demonstrate independence in prescribed initial HEP with proper form, sets and reps for safe completion in conjunction with PT POC      Sessions: 6      Goal 2: Improve Physical FS Primary Measure from 29 to 51 or better to demonstrate change for significant functional improvement.     08/19/24: no change (29)  pk     Sessions: 24      Goal 3: Patient will demonstrate independence in prescribed HEP with proper form, sets and reps for safe discharge to an independent program.   Sessions: 24      Goal 4: Increase knee ext strength to 40 lb to allow patient to transfer sit <-> stand from a regular toilet seat and chair.   Sessions: 24          Goal 5: Increase hip abduction and extensor strength to 20 lb to allow for ambulation > 20 minutes.   Sessions: 24                               [name], PT

## 2024-09-09 ENCOUNTER — Inpatient Hospital Stay
Attending: Student in an Organized Health Care Education/Training Program | Admitting: Rehabilitative and Restorative Service Providers"

## 2024-09-09 ENCOUNTER — Ambulatory Visit (INDEPENDENT_AMBULATORY_CARE_PROVIDER_SITE_OTHER): Admitting: Student in an Organized Health Care Education/Training Program

## 2024-09-09 ENCOUNTER — Encounter (INDEPENDENT_AMBULATORY_CARE_PROVIDER_SITE_OTHER): Payer: Self-pay | Admitting: Student in an Organized Health Care Education/Training Program

## 2024-09-09 VITALS — BP 127/78 | HR 69 | Ht 66.54 in | Wt 212.0 lb

## 2024-09-09 DIAGNOSIS — M5459 Other low back pain: Secondary | ICD-10-CM | POA: Insufficient documentation

## 2024-09-09 DIAGNOSIS — M5416 Radiculopathy, lumbar region: Secondary | ICD-10-CM

## 2024-09-09 DIAGNOSIS — M1712 Unilateral primary osteoarthritis, left knee: Secondary | ICD-10-CM

## 2024-09-09 DIAGNOSIS — M79605 Pain in left leg: Secondary | ICD-10-CM | POA: Insufficient documentation

## 2024-09-09 DIAGNOSIS — M1612 Unilateral primary osteoarthritis, left hip: Secondary | ICD-10-CM

## 2024-09-09 NOTE — Progress Notes (Signed)
 Orthopedic Surgery Follow-up Clinic Visit    Chief Complaint     Chief Complaint   Patient presents with    Follow-up     Pt is 73 yo female presenting for evaluation of left knee.        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    I saw her 2 weeks ago and gave her a cortisone injection for her left knee.  The injection did not help her significantly. She experiences persistent knee pain following a steroid injection. Initially, the injection provided some relief, but the pain returned three days later and has progressively worsened. The pain is most severe when standing or walking, with no pain when sitting. It is primarily located along the anterior aspect of her knee.  She also has some groin pain.  But she states that her knee pain is what bothers her the most.. At night, she occasionally experiences pain on the outside of both lower legs.    The pain significantly impacts her daily activities. She is unable to walk around her house without a cane and finds it challenging to perform tasks such as cooking or shopping. She cannot carry items in both hands as she needs to use a cane for support. She attends physical therapy once a week and performs the recommended exercises, which have provided some benefit, as she is now able to stand up straight with the aid of a cane.    Her symptoms have remained consistent since they began on May 24, 2024, without any specific injury or trauma preceding them. No significant back pain, although she acknowledges having issues with her lower back.    Her current medications include blood pressure medication, which she states has been the most effective for her over the years. She mentions that her blood pressure spiked during a visit to another doctor's office, which she attributes to both pain and her medication regimen.        From  07/08/24: Her symptoms have been present for approximately 6 weeks and have been worsening over time.  She was seen regarding this issue at the [facility] ED on 05/24/2024 where x-rays were obtained and noted to be negative for any acute fractures.  She was discharged with a prescription of Flexeril  and Medrol Dosepak.  She notes hip pain that is primarily localized to the lower back. The pain is constant in nature. The patient also endorses pain that radiates down her left leg all the way to her ankle and associated numbness/paresthesias. She denies groin pain at this time. No recent injury/trauma. The pain temporarily improves with OTC pain relievers and is exacerbated by walking, standing, and resting at nighttime.  She was seen regarding this issue by 2 external providers.  The first provider gave her a cortisone injection for her left hip which did not help her at all.  The second provider ordered an MRI of her lumbar spine and recommended an injection for her lower back.  She presents fo a third opinion today and to further discuss treatment options.     From 08/26/24: Barbara Rubio is a 73 year old female with spondylolisthesis of the lumbar spine who presents with left knee pain. She was referred by her pain doctor for evaluation of her left knee pain.     She experiences ongoing  left knee pain, primarily located behind the kneecap, which worsens with weight-bearing activities such as standing and walking. The pain is tolerable when sitting but becomes significant upon standing and putting weight on the knee, making walking difficult.     In September, she was evaluated for left leg pain and diagnosed with spondylolisthesis of the lumbar spine. She received an epidural steroid injection at a pain clinic, which provided some relief but did not resolve her knee pain. She was told by her pain doctor that her MRI may show a torn meniscus, but she has not undergone any physical therapy or received  injections specifically for her knee pain.     She is currently managing her knee pain with ibuprofen , CBD cream, and Biofreeze. She has not yet tried physical therapy for her knee. She is also undergoing physical therapy for her back and has a good relationship with her physical therapist.     No pain when sitting, but significant pain when standing and weight-bearing, particularly behind the kneecap.        Clinical interventions thus far for her left knee:  HEP: no   PT: no   Medications: yes --  ibuprofen   Injections: None  Ambulatory aid: Cane    Past Hip or Knee surgery: None    Medical Hx: Hypertension, hyperlipidemia.  Opioid Use: no current use  Smoking Status: Non-smoker    Past Medical History:   Past Medical History:   Diagnosis Date    Abnormal vision 10/23/1998    wears glasses    Arthritis     Constipation     HX - HAS BEEN MORE PREVELANT POST BARIATRIC SURGERY.    COVID-19 10/2021    HX - 21//23 URGENT CARE NOTE IN EPIC.    Difficulty walking 05/24/2024    Due to nerve pinched in spine & 2-3-4 lumbar in back spine area    Fibroids     PT HAD A PARTIAL HYSTERECTOMY - OVARIES ARE STILL IN PLACE.    Gastroesophageal reflux disease 10/23/2013    HX.    Hypercholesterolemia 03/23/2010    High Bad Cholesterol Level    Hyperlipidemia 10/23/2016    MANAGED WITH MEDS.    Hypertension 07/17/2024    PT STATES HE BP RUNS FROM 128-130/70s. MANAGED WITH MEDS - FOLLOWED BY PCP.    Left wrist fracture 2018    HX    Low back pain 05/24/2024    Pinched nerve & arthritis    LUQ pain     HX    Pain in joint 05/24/2024    Left knee, thigh and leg    Psoriatic arthritis (CMS/HCC)     HX - LOWER BACK, LEFT HAND, WRIST AND FINGERS    Urinary incontinence 03/23/2017    HX OVERACTIVE BLADDER. ISSUE RESOLVED POST BARIATRIC SURGERY - PT STATED SHE LOST 65 LBS.       Past Surgical History:   Past Surgical History[1]    Social History:   Social History[2]     Current Medications:  Current Medications[3]    Allergies:    Allergies[4]     Physical Examination:     Vitals (last filed):   BP 127/78 (BP Site: Left arm, Patient Position: Sitting, Cuff Size: Large)   Pulse 69   Ht 1.69 m (5' 6.54)   Wt 96.2 kg (212 lb)   BMI 33.67 kg/m     BMI:   Estimated body mass index is 33.67 kg/m as calculated from the  following:    Height as of this encounter: 1.69 m (5' 6.54).    Weight as of this encounter: 96.2 kg (212 lb).     Exam:  General: NAD. Patient is alert and oriented. The patient is in no apparent distress.  CV: Extremity wwp  Resp: Nonlabored breathing, breathing comfortably on RA    Examination of the Left Knee:   There is no evidence of previous incisions.  There is no effusion.   ROM 0 to  110.    There is no varus or valgus instability througout the ROM.    The patient is otherwise neurologically intact       Laboratory / Ancillary Data:   Data Review:  Lab Results   Component Value Date    WBC 5.12 05/29/2024    HGB 13.6 05/29/2024    HCT 43.7 05/29/2024    MCV 87.8 05/29/2024    PLT 165 05/29/2024     Lab Results   Component Value Date    NA 143 08/12/2024    K 4.4 08/12/2024    CO2 25 08/12/2024    CL 109 08/12/2024    BUN 11 08/12/2024     Lab Results   Component Value Date    HGBA1C 5.5 06/09/2020       Imaging:   No new x-rays today.  AP, lateral, PA flexed and sunrise  X-ray views of left knee(s) demonstrate severe joint space narrowing of the medial and lateral compartment with osteophyte formation, consistent with moderate osteoarthritic changes. No other acute pathology is noted.  Impression: Kellgren Lawrence Grade 4 osteoarthritis of left knee       IMPRESSION:   Chronic tear of the medial meniscus with moderate to high-grade attenuation of the posterior horn and extrusion of the body.   Moderate medial tibiofemoral osteoarthritis with high-grade cartilage defects and tiny subchondral cysts.  Chronic peripheral longitudinal tear of the lateral meniscus within the anterior horn.  Chronic low-grade partial  tear of the MCL.  Mild tendinosis of the quadriceps tendon.    Procedures:   None    Assessment / Plan:     Assessment & Plan  Right knee osteoarthritis  Advanced right knee osteoarthritis with persistent pain affecting quality of life. Previous cortisone injection ineffective. Physical therapy partially beneficial. Discussed total knee arthroplasty due to significant impact on quality of life. Explained risks and benefits of surgery, including recovery challenges and need for post-operative physical therapy. Surgery is elective and timing depends on her readiness. Surgery is not guaranteed to provide complete pain relief due to multiple MSK issues (specifically lower back and left hip  - Provided knee replacement surgery information packet  - Plan for total knee arthroplasty after three months from cortisone injection, earliest in February.  - Encouraged strengthening exercises to help with postop recovery  - She will contact me if she is interested in proceeding with knee replacement surgery    Right hip osteoarthritis  Contributing to groin pain and and pain in the anterior thigh. Pain is less severe than knee pain but does significantly impact daily activities. Discussed potential for hip replacement surgery, but current focus is on knee due to greater impact on mobility.  - Continue to monitor symptoms and will consider hip replacement if symptoms worsen or become more debilitating.  - Can consider a hip injection as well however she has had limited relief from cortisone injections in the past for her lower back as well as knee issues  Left lumbar radiculopathy  - Continue follow-up with pain management  - Follow-up with Dr. Rosalia for further evaluation if ESIs do not improve her symptoms  -All questions and concerns were answered in the office today and they are to call the office in the interim if they have any questions.     Total time (face-to-face and non-face-to-face) spent on today's visit was 50  minutes. This included discussion with the patient, preparation for the visit (I.e. reviewing notes), performance of a medically appropriate history and examination, and coming up with a treatment plan (including orders for medications, tests or other procedures).     ---------------------------------------------------------------------------------------------------------------  The review of the patient's medications does not in any way constitute an endorsement, by this clinician, of their use, dosage, indications, route, efficacy, interactions, or other clinical parameters.    Please pardon any potential grammatical errors or typos as aspects of this note may have been created through speech-to-text software.      Attestation:     I have seen and personally examined the patient, reviewed and personally interpreted the images pertinent for treatment decision, and am the responsible party for the plan and clinical decisions.  One or more parties may have aided in the documentation of this patient, and I have personally reviewed this work and attest it to be true to the best of my knowledge.    Signed:  Camie Blanch, MD  12:15 PM          [1]   Past Surgical History:  Procedure Laterality Date    BUNIONECTOMY Left     COLONOSCOPY, DIAGNOSTIC (SCREENING)      X 2     COLONOSCOPY, DIAGNOSTIC (SCREENING) N/A 04/13/2021    Procedure: COLONOSCOPY;  Surgeon: Ulysess Foots, MD;  Location: [id number];  Service: Gastroenterology;  Laterality: N/A;    EGD N/A 05/18/2020    Procedure: EGD w/ bx's;  Surgeon: Marcey Sammi BIRCH, MD;  Location: [facility];  Service: Gastroenterology;  Laterality: N/A;    EGD  12/2020    EGD N/A 01/24/2022    Procedure: EGD w/ Bx's;  Surgeon: Marcey Sammi BIRCH, MD;  Location: [facility];  Service: Gastroenterology;  Laterality: N/A;    HYSTERECTOMY  03/23/1997    PARTIAL - OVARIES IN PLACE.    LAPAROSCOPIC GASTRECTOMY SLEEVE WITH HIATAL HERNIA N/A 07/28/2020    Procedure:  LAPAROSCOPIC GASTRECTOMY SLEEVE WITH HIATAL HERNIA, OMENTOPEXY, LAPAROSCOPIC RESECTION OF ANTRAL AND FUNDIC GASTRIC MASS;  Surgeon: Eliberto Oris SAUNDERS, DO;  Location: [facility];  Service: General;  Laterality: N/A;   [2]   Social History  Tobacco Use    Smoking status: Never    Smokeless tobacco: Never    Tobacco comments:     Never smoked or used tobacco   Vaping Use    Vaping status: Never Used   Substance Use Topics    Alcohol use: Yes     Alcohol/week: 0.0 - 1.0 standard drinks of alcohol     Comment: 1-2 DRINKS MONTHLY    Drug use: Never   [3]   Current Outpatient Medications   Medication Sig Dispense Refill    acetaminophen  (TYLENOL ) 650 MG CR tablet Take 1 tablet (650 mg) by mouth as needed for Pain      atorvastatin  (LIPITOR) 20 MG tablet Take 1 tablet (20 mg) by mouth once daily 90 tablet 3    carvedilol  (COREG ) 12.5 MG tablet Take 1 tablet (12.5 mg) by mouth 2 (two) times  daily with meals Hold for SBP<13mmHg or heart rate less than 55 bpm 180 tablet 3    estradiol  (ESTRACE ) 0.1 MG/GM vaginal cream Place 0.5 g vaginally twice a week      Inclisiran Sodium (LEQVIO SC) Inject into the skin every 6 (six) months      meloxicam (MOBIC) 15 MG tablet Take 1 tablet (15 mg) by mouth once daily 30 tablet 0    Myrbetriq 50 MG Tablet SR 24 hr Take 1 tablet (50 mg) by mouth once daily      Wegovy  0.25 MG/0.5ML injection INJECT 0.5 MLS (0.25 MG) SUBCUTANEOUSLY ONCE A WEEK 4 mL 0     No current facility-administered medications for this visit.   [4] No Known Allergies

## 2024-09-11 ENCOUNTER — Encounter (INDEPENDENT_AMBULATORY_CARE_PROVIDER_SITE_OTHER): Payer: Self-pay | Admitting: Orthopaedic Surgery of the Spine

## 2024-09-11 ENCOUNTER — Ambulatory Visit (INDEPENDENT_AMBULATORY_CARE_PROVIDER_SITE_OTHER): Admitting: Orthopaedic Surgery of the Spine

## 2024-09-11 DIAGNOSIS — M5126 Other intervertebral disc displacement, lumbar region: Secondary | ICD-10-CM

## 2024-09-11 DIAGNOSIS — M4727 Other spondylosis with radiculopathy, lumbosacral region: Secondary | ICD-10-CM

## 2024-09-11 DIAGNOSIS — M79605 Pain in left leg: Secondary | ICD-10-CM

## 2024-09-11 DIAGNOSIS — M4316 Spondylolisthesis, lumbar region: Secondary | ICD-10-CM

## 2024-09-11 DIAGNOSIS — M5416 Radiculopathy, lumbar region: Secondary | ICD-10-CM

## 2024-09-11 DIAGNOSIS — M1612 Unilateral primary osteoarthritis, left hip: Secondary | ICD-10-CM

## 2024-09-11 DIAGNOSIS — M5127 Other intervertebral disc displacement, lumbosacral region: Secondary | ICD-10-CM

## 2024-09-11 DIAGNOSIS — M25562 Pain in left knee: Secondary | ICD-10-CM

## 2024-09-11 DIAGNOSIS — M1712 Unilateral primary osteoarthritis, left knee: Secondary | ICD-10-CM

## 2024-09-11 DIAGNOSIS — M4726 Other spondylosis with radiculopathy, lumbar region: Secondary | ICD-10-CM

## 2024-09-11 NOTE — Progress Notes (Signed)
 [facility] [place] [facility]         Subjective     History of Present Illness  Barbara Rubio is a 73 year old female with left knee osteoarthritis who presents for evaluation of persistent left knee pain and functional limitations.    Chronic left knee pain is the primary concern, described as pain with standing and weight-bearing. She denies swelling, soreness, or tenderness at rest. The pain significantly limits her mobility and daily activities, including prolonged standing and meal preparation. She states the knee pain is distinct from her back and hip symptoms and is currently the main limiting factor in her function.    She has trialed multiple conservative therapies for her left knee, including physical therapy, acetaminophen , and topical agents (Aspercreme, Biofreeze, CBD cream) with only partial relief. She received a knee injection from Dr. Rocky Blanch and was prescribed meloxicam, but discontinued after 3-4 days due to elevated blood pressure, as confirmed by home monitoring and pharmacist counseling. She did not experience significant pain relief during the brief course of meloxicam. She continues to use Tylenol  and topical agents, especially during cold weather or storms, when her arthritis symptoms worsen. Prior imaging, including x-ray and MRI of the left knee, were performed.    In October, she received a back injection from Dr. Glo, which provided significant relief for her back symptoms. A follow-up injection was deferred in early November due to elevated blood pressure after a change in antihypertensive medication. She uses a back brace, prescribed in late October or early November, which helps with lifting and has improved her posture. She attends physical therapy, which has enabled her to stand up straight, though knee pain persists. Her symptoms initially began on August 2, when she awoke unable to get out of bed, without preceding trauma or  fall.    Review of Systems    Objective   There were no vitals taken for this visit.  Physical Exam  Physical Exam    NO CHANGE     Results            Assessment/Plan   .  1. Lumbar radiculopathy  Referral to [facility]      2. Osteoarthritis of spine with radiculopathy, lumbosacral region  Referral to [facility]      3. Displacement of lumbar intervertebral disc without myelopathy  Referral to [facility]      4. 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 Spondylolisthesis of lumbar region  Referral to [facility]      6. Acute pain of left knee  Referral to [facility]      7. Arthritis of left hip  Referral to [facility]      8. Arthritis of left knee  Referral to [facility]      9. Other spondylosis with radiculopathy, lumbar region  Referral to [facility]      10. Left leg pain  Referral to [facility]          Assessment & Plan  Left knee osteoarthritis  Chronic left knee osteoarthritis with persistent pain on weight-bearing, limiting function and mobility. Conservative measures including physical therapy, acetaminophen , topical agents, and a short course of meloxicam have provided insufficient relief. She is not interested in surgical intervention  and seeks additional non-surgical pain management options. Geniculate artery embolization was discussed as a minimally invasive, non-surgical intervention that may provide symptomatic relief without surgical risks.  - Discussed non-surgical and surgical management options, including physical therapy, intra-articular injections (corticosteroid, viscosupplementation), and surgical intervention.  - Reviewed geniculate artery embolization as a  minimally invasive, non-surgical option for pain control.  - Provided information and referral for evaluation of geniculate artery embolization.    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 She previously had good response to epidural steroid injection, but further injections are contraindicated due to hypertension. Lumbar symptoms are currently less problematic than knee pain.  - Reinforced use of lumbar brace as needed for support during activities.  - No further spinal injections planned due to hypertension.    Verbal consent obtained to record this visit.

## 2024-09-15 ENCOUNTER — Encounter (INDEPENDENT_AMBULATORY_CARE_PROVIDER_SITE_OTHER): Payer: Self-pay | Admitting: Cardiology

## 2024-09-15 ENCOUNTER — Telehealth (INDEPENDENT_AMBULATORY_CARE_PROVIDER_SITE_OTHER): Payer: Self-pay | Admitting: Cardiology

## 2024-09-15 NOTE — PT/OT Therapy Note (Unsigned)
 Name: Hadlyn Amero Age: 73 y.o. Date of Service: 09/16/2024  Referring Physician: Tobie Hue, MD  Date of Injury: No data found  Plan of Care Dates From:No data found To: No data was found  Date Treatment Started: No data found    Sessions in Plan of Care: No data was found       Visit Count: Visit count could not be calculated. Make sure you are using a visit which is associated with an episode.   Diagnosis:   No diagnosis found.        Pertinent PMHx:  onset LBP w/L LE radic 05/24/24, insidious onset (woke w/pain L LE). Symptoms may have been triggered by sitting in bed the weekend prior working on computer. Symptoms have been progressively worsening over time.     MRI results:  MRI LEFT KNEE WITHOUT CONTRAST  HISTORY: Left knee pain for several years. History of arthritis.  COMPARISON: None.  TECHNIQUE: MRI of the left knee performed on a 1.5T scanner without  intravenous contrast.  CONTRAST: None.  FINDINGS:  Menisci:   Lateral meniscus: There is a degeneration and partial peripheral  longitudinal tear present within the anterior horn.     Medial meniscus: There is a chronic tear with moderate to high-grade  diffuse attenuation of the posterior horn, and moderate attenuation and  moderate to advanced extrusion of the body with intrasubstance signal  changes.     Ligaments: The ACL and PCL are intact. There is a chronic partial tear of  the MCL proximally as demonstrated by thickening and intermediate signal  alteration. The LCL complex is intact.     Muscles/Tendons: There is mild tendinosis of the quadriceps tendon. No  myositis demonstrated.     Cartilage: No osteochondral lesion demonstrated. Tricompartment  degenerative changes most significant at the medial tibiofemoral  compartment where there are focal high-grade cartilage defects with minimal  subchondral edema and tiny subchondral cysts. There is a small focal  high-grade cartilage defect at the lateral femoral condyle.     Bone: No acute  fracture demonstrated.  No suspicious marrow signal  abnormality demonstrated.       Joint: There is trace joint effusion. No loose body is identified.     IMPRESSION:      1.  Chronic tear of the medial meniscus with moderate to high-grade  attenuation of the posterior horn and extrusion of the body.     2.  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     3.  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     4.  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     5.  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     6.  Mild tendinosis of the quadriceps tendon.     Electronically signed by: Derick Daunt M.D.  [facility]  AB: 08/15/24       Subjective     Daily Subjective   Pt reports continued L knee and hip pain which limits ability to bear weight on L LE. Received injection in L knee last week which provided some relief of knee pain however pain intensified a few days later and has not abated. Pt is feeling very frustrated. Reports compliance w/HEP.      Social Support/Occupation    Lives in: one story house    Lives with: alone    Occupation:  [employer] - currently not working           Precautions: No data was found  Allergies: Patient has no known allergies.    Objective    Outcomes:  Goal: 51 Initial  07/15/2024  08/19/24               Primary Functional Status Measure 29  29         Oswestry 13.4  13.4          Pain: /10 /10 /10 /10 /10   Pain last 24 hrs 7  8         Least pain last 30 days 6  7         Greatest pain last 30 days 7  9         PSFS Activities: /10 /10 /10 /10 /10   1. walk 3 6          2. Get Dressed, [employer] 4  6         3. Sleep at night..Pain intensify while in bed sleeping.Wake me up every night@3 :00 am  2  6           Lumbar AROM  Initial   08/19/24              Flexion  55           Extension  0            Side Bend R  20 p! LB           Side Bend L  25           Rotation R  WNL           Rotation L  WNL p! L groin           (blank fields were intentionally left blank)     LE Strength  (lbs) Initial  07/15/2024   R Initial  07/15/2024    L    R    L    R    L    R    L    R    L   Hip Flex Seated (L1/2) 16.4 9.0                   Hip Flex Supine (L1/2)                       Hip Ext Knee Flex                       Hip Ext Knee Ext                       Hip Abd 3.5 0                   Hip Add                       Hip IR                       Hip ER                       Quads (L3) 29.8 13.6                   HS Seated (  S1) 20 13.6                   HS Prone (S1)                       Ankle DF (L4)                       Ankle PF                       Toe Ext (L5)                       (blank fields were intentionally left blank)      Flexibility  Initial R    Initial L    R L R L   Hamstrings               Quads               Gastrocs               (blank fields were intentionally left blank)     Balance (sec) Initial               SLS R: Eyes Open (EO):             SLS R: Eyes Closed (EC):             SLS L: Eyes Open (EO):             SLS L: Eyes Closed (EC):             (blank fields were intentionally left blank)                             Treatment     Therapeutic Exercises - Justified to address any of the following:  To develop strength, endurance, ROM and/or flexibility.   Subjective discussion    Foto  re-assessment w/discussion of results - see above    Instructed pt in the following to add to HEP:  - seated knee hang w/10# ankle wt x 3-5 min   - standing on step L hip swings w/10# ankle weight              Neuromuscular Re-Education - Justified to address any of the following:   Re-education of movement, balance, coordination, kinesthetic sense, posture and/or proprioception for sitting and/or standing activities.             Manual Therapy - Justified to address any of the following:    Mobilization of  joints and soft tissues, manipulation, manual lymphatic drainage, and/or manual traction.    Seated on edge of plinth:  Inf tibial distraction w/PA proximal tib mobs grade III    Supine:  Lateral and inf/lat L hip mobs grade III w/strap  Long leg distraction L hip w/strap grade III    Self-Care/Home Management Training - Focus on activities of daily living (ADL) and compensatory training for ADLs (dressing, bathing, toileting, food prep/cooking), safety procedures/home environment education, and instructions in the use of adaptive equipment and assistive technology for use in the home environment.  Includes training of patient or caregiver on strategies that are not covered in other CPT codes.  Extensive discussion regarding knee and hip symptoms and benefit of knee injection for diagnostic purposes. Discussed possible temporary use of  rolling walker to promote more upright gait and to facilitate mobility.    Home Exercises   Access Code: [email or web address]  URL: [email or web address]  Date: 08/13/2024  Prepared by: Avelina Starks    Exercises  - Supine Transversus Abdominis Bracing - Hands on Stomach  - 2 x daily - 7 x weekly - 1 sets - 10 reps - 10 sec hold  - Supine Sciatic Nerve Glide  - 5 x daily - 7 x weekly - 1 sets - 30 reps  - Supine Heel Slide (Mirrored)  - 2 x daily - 7 x weekly - 2 sets - 10 reps  - Supine Lower Trunk Rotation  - 2 x daily - 7 x weekly - 1 sets - 20 reps            Assessment      Decreased subjective c/o at end of session however weightbearing intensifies symptoms.    Patient requires continued skilled care to: improve ROM, strength and stability so pt can return to PLOF  Plan   Continue with POC  Progress hip/knee strengthening/stability as tol                [name], PT

## 2024-09-15 NOTE — Telephone Encounter (Signed)
There is a Pharmacist, community message regarding this

## 2024-09-15 NOTE — Telephone Encounter (Signed)
 Copied from CRM [id number]. Topic: Clinical Support - Speak With Nurse  >> Sep 15, 2024 12:05 PM [name] wrote:  Barbara Rubio called about Clinical Support - Speak With Nurse.  Additional details:    Patient called to inform the MD she has completed BP reading logs from Oct 29th - 11/23. Please review mychart message from 09/15/24. Please call the patient to provide feedback.

## 2024-09-16 ENCOUNTER — Inpatient Hospital Stay: Admitting: Rehabilitative and Restorative Service Providers"

## 2024-09-16 DIAGNOSIS — M79605 Pain in left leg: Secondary | ICD-10-CM

## 2024-09-16 DIAGNOSIS — M5459 Other low back pain: Secondary | ICD-10-CM

## 2024-09-16 DIAGNOSIS — M5416 Radiculopathy, lumbar region: Secondary | ICD-10-CM

## 2024-09-22 ENCOUNTER — Telehealth (INDEPENDENT_AMBULATORY_CARE_PROVIDER_SITE_OTHER): Payer: Self-pay

## 2024-09-22 ENCOUNTER — Other Ambulatory Visit (INDEPENDENT_AMBULATORY_CARE_PROVIDER_SITE_OTHER): Payer: Self-pay | Admitting: Student in an Organized Health Care Education/Training Program

## 2024-09-22 NOTE — Telephone Encounter (Signed)
 Patient called today endorsing that she would like her pain management referral for Geniculate artery embolization to be resent for the RIGHT KNEE. After investigating notes patient has never been seen or assessed for RIGHT KNEE. The only available information pertains to LEFT Knee which is what the referral is written for. Patient endorsing that the cortisone injection from Dr. Tobie finally kicked in and she is no longer having left knee pain but she is having right knee pain. Notified patient that we are unable to right orders for body parts that we have never assessed nor will she have enough corresponding information to submit to insurance for R knee procedures. She Will need to be seen by a provider for the R Knee. She endorses that she lives in [place] and it is hard to get in to see a provider, notified patient that Dr. Rosalia has a clinic at [facility] on Mondays, patient requested to be transferred to scheduling line. Patient transferred.

## 2024-09-23 ENCOUNTER — Ambulatory Visit (HOSPITAL_BASED_OUTPATIENT_CLINIC_OR_DEPARTMENT_OTHER): Admitting: Orthopaedic Surgery

## 2024-09-23 ENCOUNTER — Ambulatory Visit (HOSPITAL_BASED_OUTPATIENT_CLINIC_OR_DEPARTMENT_OTHER)

## 2024-09-23 ENCOUNTER — Encounter (HOSPITAL_BASED_OUTPATIENT_CLINIC_OR_DEPARTMENT_OTHER): Payer: Self-pay | Admitting: Orthopaedic Surgery

## 2024-09-23 VITALS — BP 131/80 | HR 86

## 2024-09-23 DIAGNOSIS — M160 Bilateral primary osteoarthritis of hip: Secondary | ICD-10-CM

## 2024-09-23 DIAGNOSIS — M170 Bilateral primary osteoarthritis of knee: Secondary | ICD-10-CM

## 2024-09-23 DIAGNOSIS — M1711 Unilateral primary osteoarthritis, right knee: Secondary | ICD-10-CM

## 2024-09-23 DIAGNOSIS — M25561 Pain in right knee: Secondary | ICD-10-CM

## 2024-09-23 DIAGNOSIS — M25552 Pain in left hip: Secondary | ICD-10-CM

## 2024-09-23 NOTE — Progress Notes (Unsigned)
 Orthopedic Surgery New Patient Clinic Visit    History of Present Illness:         History of Present Illness  Barbara Rubio is a 73 year old female with advanced osteoarthritis who presents with bilateral hip pain. She is accompanied by her daughter.    She has had bilateral anterior hip pain for three months, described as achy and burning, rated 8/10. Pain worsens with activity, especially stair climbing and at night, and improves with Tylenol , Biofreeze, CBD cream, and physical therapy. She arrived using a rollator.    Recent hip x-rays show worsening bilateral osteoarthritic changes compared with August. She attends physical therapy and previously had a left knee injection that initially increased pain but later improved it.    She notes burning in the groin, worse on the right, and that the right knee is more symptomatic than the left. She has high blood pressure, checks it daily, and reports prior elevated readings that she associates with medication. She stopped meloxicam  after four days because she believes it raised her blood pressure.    She has pain radiating down the right leg that she attributes to sciatica. Certain leg positions, such as a Z shape, give temporary relief. She has tried multiple providers, including orthopedics, and continues physical therapy and medication.    She reports swelling and tingling in both legs, crepitus with walking, and persistent burning in the groin region.      Past Medical History: Medical History[1]    Past Surgical History: Past Surgical History[2]    Current Medications:  Current Medications[3]    Allergies: Allergies[4]       Physical Examination:     Vitals (last filed):   BP 131/80   Pulse 86     BMI:  There is no height or weight on file to calculate BMI.    Exam:  Physical Exam  MUSCULOSKELETAL: Normal range of motion in knees with crepitus. Knee pain responsive to steroid injections. Stiffness in hip without pain, but pain is elicited on  manipulation and localized to the hip. Swelling noted in the hip area.            Laboratory / Ancillary Data:     Data Review:  Lab Results   Component Value Date    WBC 5.12 05/29/2024    HGB 13.6 05/29/2024    HCT 43.7 05/29/2024    MCV 87.8 05/29/2024    PLT 165 05/29/2024     Lab Results   Component Value Date    NA 143 08/12/2024    K 4.4 08/12/2024    CO2 25 08/12/2024    CL 109 08/12/2024    BUN 11 08/12/2024     Lab Results   Component Value Date    HGBA1C 5.5 06/09/2020           Imaging:     Results  RADIOLOGY  Hip X-ray: Kellgren-Lawrence grade 4 osteoarthritic changes of the left hip, Kellgren-Lawrence grade 3 changes of the right hip, both hips significantly worse from X-rays dated 05/24/2024 (09/23/2024).  Knee X-ray: Significant osteoarthritis, left knee worse than right, minimal joint space remaining.      Procedures:   Regarding care beyond the evaluation and management performed above for definitive treatment of the problem, the patient also elected to proceed with an injection for short-term pain relief. This was done today as described below.    Procedure Note: right knee intra-articular injection (cpt code 79389)    After  cleansing the knee with chlorhexidine, a 22 g needle was used to access the knee. 4cc of Marcaine , and 40mg  of Kenalog  were injected into the knee. The patient tolerated the procedure well without complication.    Assessment / Plan:     Assessment & Plan  Bilateral hip osteoarthritis, left worse than right, with rapid progression  Rapid progression of osteoarthritis in both hips, left more severe. X-rays show Kellegren Lawrence grade 4 in left hip, grade 3 in right. Left hip bone on bone. Pain predominantly anterior, 8/10, with burning and swelling in groin. Physical therapy and medications provide some relief. Differential includes potential sciatica, but hip and knee issues are primary.  - Referred to non-operative sports specialist for potential left hip injection.  -  Continue physical therapy.  - Monitor blood pressure post-injection, consult primary care if elevated.  - Discuss potential hip replacement surgery if conservative measures fail.    Bilateral knee osteoarthritis, left worse than right  Advanced osteoarthritis in both knees, left more symptomatic. Previous steroid injection in left knee provided relief. X-rays show significant arthritis, left knee slightly worse. Pain managed with physical therapy and topical treatments. Rapid hip osteoarthritis progression suggests similar knee progression.  - Administered steroid injection in right knee.  - Continue physical therapy.  - Consider Voltaren  gel for additional pain relief.  - Plan for bilateral knee x-rays at next visit.      Future Appointments   Date Time Provider Department Center   09/29/2024  3:00 PM Rosalia Corina LABOR, MD [facility]   10/03/2024 11:00 AM Guinevere Avelina HERO, PT [facility]   10/08/2024  1:30 PM Guinevere Avelina HERO, PT [facility]   11/04/2024 10:45 AM Guinevere Avelina HERO, PT [facility]   11/06/2024 10:45 AM Guinevere Avelina HERO, PT [facility]   11/11/2024 10:45 AM Guinevere Avelina HERO, PT [facility]   11/13/2024 10:45 AM Guinevere Avelina HERO, PT [facility]   11/25/2024 10:15 AM Tobie Hue, MD [facility]   12/23/2024  1:15 PM Elaine Fonda BROCKS, MD [facility]   01/07/2025 11:30 AM Dingess, Lynwood DASEN, FNP [facility]       Signed:  Fonda Elaine, MD  09/23/2024  4:43 PM    ---------------------------------------------------------------------------------------------------------------  The review of the patient's medications does not in any way constitute an endorsement, by this clinician,  of their use, dosage, indications, route, efficacy, interactions, or other clinical parameters.    I have seen and personally examined the patient, reviewed and personally interpreted the images pertinent for  treatment decision, and am the responsible party for the plan and clinical decisions.  One or more parties may have aided in the documentation of this patient, and I have personally reviewed this work and attest it to be true to the best of my knowledge.         [1]   Past Medical History:  Diagnosis Date    Abnormal vision 10/23/1998    wears glasses    Arthritis     Constipation     HX - HAS BEEN MORE PREVELANT POST BARIATRIC SURGERY.    COVID-19 10/2021    HX - 21//23 URGENT CARE NOTE IN EPIC.    Difficulty walking 05/24/2024    Due to nerve pinched in spine & 2-3-4 lumbar in back spine area    Fibroids  PT HAD A PARTIAL HYSTERECTOMY - OVARIES ARE STILL IN PLACE.    Gastroesophageal reflux disease 10/23/2013    HX.    Hypercholesterolemia 03/23/2010    High Bad Cholesterol Level    Hyperlipidemia 10/23/2016    MANAGED WITH MEDS.    Hypertension 07/17/2024    PT STATES HE BP RUNS FROM 128-130/70s. MANAGED WITH MEDS - FOLLOWED BY PCP.    Left wrist fracture 2018    HX    Low back pain 05/24/2024    Pinched nerve & arthritis    LUQ pain     HX    Pain in joint 05/24/2024    Left knee, thigh and leg    Psoriatic arthritis (CMS/HCC)     HX - LOWER BACK, LEFT HAND, WRIST AND FINGERS    Urinary incontinence 03/23/2017    HX OVERACTIVE BLADDER. ISSUE RESOLVED POST BARIATRIC SURGERY - PT STATED SHE LOST 65 LBS.   [2]   Past Surgical History:  Procedure Laterality Date    BUNIONECTOMY Left     COLONOSCOPY, DIAGNOSTIC (SCREENING)      X 2     COLONOSCOPY, DIAGNOSTIC (SCREENING) N/A 04/13/2021    Procedure: COLONOSCOPY;  Surgeon: Ulysess Foots, MD;  Location: [id number];  Service: Gastroenterology;  Laterality: N/A;    EGD N/A 05/18/2020    Procedure: EGD w/ bx's;  Surgeon: Marcey Sammi BIRCH, MD;  Location: [facility];  Service: Gastroenterology;  Laterality: N/A;    EGD  12/2020    EGD N/A 01/24/2022    Procedure: EGD w/ Bx's;  Surgeon: Marcey Sammi BIRCH, MD;  Location: [facility];  Service:  Gastroenterology;  Laterality: N/A;    HYSTERECTOMY  03/23/1997    PARTIAL - OVARIES IN PLACE.    LAPAROSCOPIC GASTRECTOMY SLEEVE WITH HIATAL HERNIA N/A 07/28/2020    Procedure: LAPAROSCOPIC GASTRECTOMY SLEEVE WITH HIATAL HERNIA, OMENTOPEXY, LAPAROSCOPIC RESECTION OF ANTRAL AND FUNDIC GASTRIC MASS;  Surgeon: Eliberto Oris SAUNDERS, DO;  Location: [facility];  Service: General;  Laterality: N/A;   [3]   Current Outpatient Medications   Medication Sig Dispense Refill    acetaminophen  (TYLENOL ) 650 MG CR tablet Take 1 tablet (650 mg) by mouth as needed for Pain      atorvastatin  (LIPITOR) 20 MG tablet Take 1 tablet (20 mg) by mouth once daily 90 tablet 3    carvedilol  (COREG ) 12.5 MG tablet Take 1 tablet (12.5 mg) by mouth 2 (two) times daily with meals Hold for SBP<165mmHg or heart rate less than 55 bpm 180 tablet 3    estradiol  (ESTRACE ) 0.1 MG/GM vaginal cream Place 0.5 g vaginally twice a week      Inclisiran Sodium (LEQVIO SC) Inject into the skin every 6 (six) months      Myrbetriq 50 MG Tablet SR 24 hr Take 1 tablet (50 mg) by mouth once daily      Wegovy  0.25 MG/0.5ML injection INJECT 0.5 MLS (0.25 MG) SUBCUTANEOUSLY ONCE A WEEK 4 mL 0    meloxicam  (MOBIC ) 15 MG tablet Take 1 tablet (15 mg) by mouth once daily 30 tablet 0     No current facility-administered medications for this visit.   [4] No Known Allergies

## 2024-09-24 ENCOUNTER — Inpatient Hospital Stay: Admitting: Rehabilitative and Restorative Service Providers"

## 2024-09-24 ENCOUNTER — Encounter (INDEPENDENT_AMBULATORY_CARE_PROVIDER_SITE_OTHER): Payer: Self-pay

## 2024-09-24 NOTE — Telephone Encounter (Signed)
 Patient called to have updated referral from yesterdays appointment sent to her via mychart.      Message sent

## 2024-09-25 ENCOUNTER — Other Ambulatory Visit (INDEPENDENT_AMBULATORY_CARE_PROVIDER_SITE_OTHER): Payer: Self-pay | Admitting: Cardiology

## 2024-09-25 DIAGNOSIS — I10 Essential (primary) hypertension: Secondary | ICD-10-CM

## 2024-09-25 MED ORDER — LISINOPRIL 10 MG PO TABS: 10.0000 mg | ORAL_TABLET | Freq: Every day | ORAL | 3 refills | Status: AC

## 2024-09-29 ENCOUNTER — Ambulatory Visit (INDEPENDENT_AMBULATORY_CARE_PROVIDER_SITE_OTHER): Admitting: Family Medicine

## 2024-09-29 ENCOUNTER — Encounter (INDEPENDENT_AMBULATORY_CARE_PROVIDER_SITE_OTHER): Payer: Self-pay

## 2024-09-29 ENCOUNTER — Ambulatory Visit (INDEPENDENT_AMBULATORY_CARE_PROVIDER_SITE_OTHER): Admitting: Orthopaedic Surgery of the Spine

## 2024-09-30 ENCOUNTER — Encounter (INDEPENDENT_AMBULATORY_CARE_PROVIDER_SITE_OTHER): Payer: Self-pay | Admitting: Family Medicine

## 2024-09-30 ENCOUNTER — Encounter (INDEPENDENT_AMBULATORY_CARE_PROVIDER_SITE_OTHER): Payer: Self-pay

## 2024-09-30 ENCOUNTER — Ambulatory Visit (INDEPENDENT_AMBULATORY_CARE_PROVIDER_SITE_OTHER): Admitting: Family Medicine

## 2024-09-30 VITALS — BP 142/83 | HR 80 | Resp 18 | Ht 66.54 in | Wt 212.0 lb

## 2024-09-30 DIAGNOSIS — M1611 Unilateral primary osteoarthritis, right hip: Secondary | ICD-10-CM

## 2024-09-30 MED ORDER — TRIAMCINOLONE ACETONIDE 40 MG/ML IJ SUSP
80.0000 mg | Freq: Once | INTRAMUSCULAR | Status: AC
  Administered 2024-09-30: 80 mg via INTRA_ARTICULAR

## 2024-09-30 NOTE — Progress Notes (Signed)
Encounter Diagnosis   Name Primary?    Primary osteoarthritis of right hip Yes     ULTRASOUND GUIDED INJECTION  Verbal informed consent was obtained prior to the procedure. Prior to this, the risks and side effects were discussed, including, but not limited to pain, infection, allergic reaction, bleeding, damage to tissues, vessels and nerves, skin atrophy, steroid flare.    Using the GE [employer] e [place] system, I performed a ULTRASOUND guided Injection of the right hip via a in-plane approach after  chlorohexadine prep and needle localization with the 4C Probe in long axis using a 22 ga spinal needle, injecting 80mg  Kenalog   and 3 ml Lidocaine 1% w/o Epi. -----> 50 % relief of symptoms after injection. The patient tolerated the procedure well without complications. Permanent record of the images saved in the GE Logiq e [place] system    The injection was performed for therapeutic purposes

## 2024-10-02 NOTE — PT/OT Therapy Note (Unsigned)
 Name: Barbara Rubio Age: 73 y.o. Date of Service: 10/03/2024  Referring Physician: Tobie Hue, MD  Date of Injury: No data found  Plan of Care Dates From:No data found To: No data was found  Date Treatment Started: No data found    Sessions in Plan of Care: No data was found       Visit Count: Visit count could not be calculated. Make sure you are using a visit which is associated with an episode.   Diagnosis:   No diagnosis found.          Pertinent PMHx:  onset LBP w/L LE radic 05/24/24, insidious onset (woke w/pain L LE). Symptoms may have been triggered by sitting in bed the weekend prior working on computer. Symptoms have been progressively worsening over time.     MRI results:  MRI LEFT KNEE WITHOUT CONTRAST  HISTORY: Left knee pain for several years. History of arthritis.  COMPARISON: None.  TECHNIQUE: MRI of the left knee performed on a 1.5T scanner without  intravenous contrast.  CONTRAST: None.  FINDINGS:  Menisci:   Lateral meniscus: There is a degeneration and partial peripheral  longitudinal tear present within the anterior horn.     Medial meniscus: There is a chronic tear with moderate to high-grade  diffuse attenuation of the posterior horn, and moderate attenuation and  moderate to advanced extrusion of the body with intrasubstance signal  changes.     Ligaments: The ACL and PCL are intact. There is a chronic partial tear of  the MCL proximally as demonstrated by thickening and intermediate signal  alteration. The LCL complex is intact.     Muscles/Tendons: There is mild tendinosis of the quadriceps tendon. No  myositis demonstrated.     Cartilage: No osteochondral lesion demonstrated. Tricompartment  degenerative changes most significant at the medial tibiofemoral  compartment where there are focal high-grade cartilage defects with minimal  subchondral edema and tiny subchondral cysts. There is a small focal  high-grade cartilage defect at the lateral femoral condyle.     Bone: No acute  fracture demonstrated.  No suspicious marrow signal  abnormality demonstrated.       Joint: There is trace joint effusion. No loose body is identified.     IMPRESSION:      1.  Chronic tear of the medial meniscus with moderate to high-grade  attenuation of the posterior horn and extrusion of the body.     2.  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     3.  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     4.  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     5.  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     6.  Mild tendinosis of the quadriceps tendon.     Electronically signed by: Derick Daunt M.D.  [facility]  AB: 08/15/24       Subjective     Daily Subjective   Pt reports L knee pain has resolved. Currently experiencing B hip/groin pain which makes it difficult to walk and lift her legs in/out of car. Thinks she may want home therapy due to it being very difficult to get out of the house due to pain. Cold weather is exacerbating symptoms.      Social Support/Occupation    Lives in: one story house    Lives with: alone  Occupation: [employer] - currently not working           Precautions: No data was found  Allergies: Patient has no known allergies.    Objective    Outcomes:  Goal: 51 Initial  07/15/2024  08/19/24    09/09/24       Primary Functional Status Measure 29  29  43       Oswestry 13.4  13.4          Pain: /10 /10 /10 /10 /10   Pain last 24 hrs 7  8         Least pain last 30 days 6  7         Greatest pain last 30 days 7  9         PSFS Activities: /10 /10 /10 /10 /10   1. walk 3 6          2. Get Dressed, [employer] 4  6         3. Sleep at night..Pain intensify while in bed sleeping.Wake me up every night@3 :00 am  2  6           Lumbar AROM  Initial   08/19/24              Flexion  55           Extension  0            Side Bend R  20 p! LB           Side Bend L  25           Rotation R  WNL           Rotation L  WNL p! L groin           (blank fields were intentionally left blank)     LE Strength  (lbs) Initial  07/15/2024   R Initial  07/15/2024    L    R    L    R    L    R    L    R    L   Hip Flex Seated (L1/2) 16.4 9.0                   Hip Flex Supine (L1/2)                       Hip Ext Knee Flex                       Hip Ext Knee Ext                       Hip Abd 3.5 0                   Hip Add                       Hip IR                       Hip ER                       Quads (L3) 29.8 13.6                   HS Seated (S1) 20 13.6  HS Prone (S1)                       Ankle DF (L4)                       Ankle PF                       Toe Ext (L5)                       (blank fields were intentionally left blank)      Flexibility  Initial R    Initial L    R L R L   Hamstrings               Quads               Gastrocs               (blank fields were intentionally left blank)     Balance (sec) Initial               SLS R: Eyes Open (EO):             SLS R: Eyes Closed (EC):             SLS L: Eyes Open (EO):             SLS L: Eyes Closed (EC):             (blank fields were intentionally left blank)                             Treatment     Therapeutic Exercises - Justified to address any of the following:  To develop strength, endurance, ROM and/or flexibility.   recumbent bike L2 x 6 min w/subjective discussion and to prepare for session.     Added: Seated knee ext w/4# ankle wts 3 x 10 each side    seated knee hang w/4# ankle wt x 3 min     Added: seated hip flex up over small hurdle w/4# ankle weight 2 x 10 each side w/cues to avoid rocking backwards    Updated HEP, went over and provided handout - see below      Neuromuscular Re-Education - Justified to address any of the following:   Re-education of movement, balance, coordination, kinesthetic sense, posture and/or proprioception for sitting  and/or standing activities.             Gait Training - Justified to address any of the following:    Ambulation including weight bearing variation, gait sequencing, instruction/progression of an assistive device, and/or stair climbing.    Educated patient in proper heel toe gait pattern while using a front wheeled walker and provided cueing while walking with patient 60 ft in the gym. Instructed pt in how to adjust walker to correct height. Discussed rationale and importance of proper gait for improvement in function and tolerance in ADLs.    Recommended pt acquire front wheeled walker to facilitate more upright gait pattern and to facilitate more = wt bearing B      Home Exercises   Access Code: [email or web address]  URL: [email or web address]  Date: 09/16/2024  Prepared by: Avelina Starks    Exercises  - Supine Transversus Abdominis Bracing - Hands on Stomach  - 2 x daily - 7  x weekly - 1 sets - 10 reps - 10 sec hold  - Supine Sciatic Nerve Glide  - 5 x daily - 7 x weekly - 1 sets - 30 reps  - Supine Heel Slide (Mirrored)  - 2 x daily - 7 x weekly - 2 sets - 10 reps  - Supine Lower Trunk Rotation  - 2 x daily - 7 x weekly - 1 sets - 20 reps  - Seated Long Arc Quad with Ankle Weight (Mirrored)  - 1 x daily - 5 x weekly - 3 sets - 10 reps  - Seated Long Arc Quad with Ankle Weight  - 1 x daily - 5 x weekly - 3 sets - 10 reps  - Hip Swing  - 1 x daily - 7 x weekly - 3 sets - 10 reps  - Hip Swing (Mirrored)  - 1 x daily - 7 x weekly - 3 sets - 10 reps  - Supine Hip External Rotation  - 1 x daily - 7 x weekly - 3 sets - 10 reps  - Supine Hip External Rotation (Mirrored)  - 1 x daily - 7 x weekly - 3 sets - 10 reps  - Seated Hip Flexion March with Ankle Weights  - 1 x daily - 7 x weekly - 3 sets - 10 reps  - Seated Hip Flexion March with Ankle Weights  - 1 x daily - 7 x weekly - 3 sets - 10 reps        Assessment      Pt challenged w/new exercises w/appropriate ms fatigue however able to perform all without c/o knee/hip  pain.  Pt able to ambulate with more upright posture w/front wheeled walker.    Patient requires continued skilled care to: improve ROM, strength and stability so pt can return to PLOF  Plan   Continue with POC  Reassess ROM  Progress hip/knee strengthening/stability as tol                  [name], PT

## 2024-10-03 ENCOUNTER — Inpatient Hospital Stay
Attending: Student in an Organized Health Care Education/Training Program | Admitting: Rehabilitative and Restorative Service Providers"

## 2024-10-03 DIAGNOSIS — M79605 Pain in left leg: Secondary | ICD-10-CM | POA: Insufficient documentation

## 2024-10-03 DIAGNOSIS — M5416 Radiculopathy, lumbar region: Secondary | ICD-10-CM

## 2024-10-03 DIAGNOSIS — M5459 Other low back pain: Secondary | ICD-10-CM

## 2024-10-07 ENCOUNTER — Other Ambulatory Visit (INDEPENDENT_AMBULATORY_CARE_PROVIDER_SITE_OTHER): Payer: Self-pay | Admitting: Family

## 2024-10-07 ENCOUNTER — Ambulatory Visit (HOSPITAL_BASED_OUTPATIENT_CLINIC_OR_DEPARTMENT_OTHER): Admitting: Orthopaedic Surgery

## 2024-10-07 NOTE — PT/OT Therapy Note (Unsigned)
 Name: Barbara Rubio Age: 73 y.o. Date of Service: 10/08/2024  Referring Physician: Tobie Hue, MD  Date of Injury: No data found  Plan of Care Dates From:No data found To: No data was found  Date Treatment Started: No data found    Sessions in Plan of Care: No data was found       Visit Count: Visit count could not be calculated. Make sure you are using a visit which is associated with an episode.   Diagnosis:   No diagnosis found.            Pertinent PMHx:  onset LBP w/L LE radic 05/24/24, insidious onset (woke w/pain L LE). Symptoms may have been triggered by sitting in bed the weekend prior working on computer. Symptoms have been progressively worsening over time.     MRI results:  MRI LEFT KNEE WITHOUT CONTRAST  HISTORY: Left knee pain for several years. History of arthritis.  COMPARISON: None.  TECHNIQUE: MRI of the left knee performed on a 1.5T scanner without  intravenous contrast.  CONTRAST: None.  FINDINGS:  Menisci:   Lateral meniscus: There is a degeneration and partial peripheral  longitudinal tear present within the anterior horn.     Medial meniscus: There is a chronic tear with moderate to high-grade  diffuse attenuation of the posterior horn, and moderate attenuation and  moderate to advanced extrusion of the body with intrasubstance signal  changes.     Ligaments: The ACL and PCL are intact. There is a chronic partial tear of  the MCL proximally as demonstrated by thickening and intermediate signal  alteration. The LCL complex is intact.     Muscles/Tendons: There is mild tendinosis of the quadriceps tendon. No  myositis demonstrated.     Cartilage: No osteochondral lesion demonstrated. Tricompartment  degenerative changes most significant at the medial tibiofemoral  compartment where there are focal high-grade cartilage defects with minimal  subchondral edema and tiny subchondral cysts. There is a small focal  high-grade cartilage defect at the lateral femoral condyle.     Bone: No acute  fracture demonstrated.  No suspicious marrow signal  abnormality demonstrated.       Joint: There is trace joint effusion. No loose body is identified.     IMPRESSION:      1.  Chronic tear of the medial meniscus with moderate to high-grade  attenuation of the posterior horn and extrusion of the body.     2.  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     3.  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     4.  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     5.  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     6.  Mild tendinosis of the quadriceps tendon.     Electronically signed by: Derick Daunt M.D.  [facility]  AB: 08/15/24       Subjective     Daily Subjective   Saw Dr. Elaine. Imaging revealed advanced arthritis in hip as compared w/previous imaging. Pt reports received cortizone injections in R hip 09/26/24 and R knee 09/23/24 with relief. Had started using walker before thanksgiving but now back to cane. Now able to don/doff shoes/socks, clip toe nails. Able to sleep thru the night now. Has been able to decrease pain meds to only 2 tylenol  this wk.  May have THR  surgery R hip next year.      Social Support/Occupation    Lives in: one story house    Lives with: alone    Occupation: [employer] - currently not working           Precautions: No data was found  Allergies: Patient has no known allergies.    Objective    Outcomes:  Goal: 51 Initial  07/15/2024  08/19/24    09/09/24 10/03/24     Primary Functional Status Measure 29  29  43 60      Oswestry 13.4  13.4          Pain: /10 /10 /10 /10 /10   Pain last 24 hrs 7  8         Least pain last 30 days 6  7         Greatest pain last 30 days 7  9         PSFS Activities: /10 /10 /10 /10 /10   1. walk 3 6          2. Get Dressed, [employer], [employer] 4  6         3. Sleep at night..Pain  intensify while in bed sleeping.Wake me up every night@3 :00 am  2  6           Lumbar AROM  Initial   08/19/24              Flexion  55           Extension  0           Side Bend R  20 p! LB           Side Bend L  25           Rotation R  WNL           Rotation L  WNL p! L groin           (blank fields were intentionally left blank)     LE Strength  (lbs) Initial  07/15/2024   R Initial  07/15/2024    L    R    L    R    L    R    L    R    L   Hip Flex Seated (L1/2) 16.4 9.0                   Hip Flex Supine (L1/2)                       Hip Ext Knee Flex                       Hip Ext Knee Ext                       Hip Abd 3.5 0                   Hip Add                       Hip IR                       Hip ER  Quads (L3) 29.8 13.6                   HS Seated (S1) 20 13.6                   HS Prone (S1)                       Ankle DF (L4)                       Ankle PF                       Toe Ext (L5)                       (blank fields were intentionally left blank)      Flexibility  Initial R    Initial L    R L R L   Hamstrings               Quads               Gastrocs               (blank fields were intentionally left blank)     Balance (sec) Initial               SLS R: Eyes Open (EO):             SLS R: Eyes Closed (EC):             SLS L: Eyes Open (EO):             SLS L: Eyes Closed (EC):             (blank fields were intentionally left blank)                             Treatment     Therapeutic Exercises - Justified to address any of the following:  To develop strength, endurance, ROM and/or flexibility.   recumbent bike L2 x 6 min w/subjective discussion and to prepare for session.     Added: standing w/back against wall B shoulder flex and scaption w/2# DB x 10 each w/cues to avoid shoulder shrug    foto re-assessment w/discussion of results - see above                Neuromuscular Re-Education - Justified to address any of the following:   Re-education of movement, balance,  coordination, kinesthetic sense, posture and/or proprioception for sitting and/or standing activities.   Added: standing w/back against wall pressing shoulder blades into wall 10 sec hold x 15 w/cues to facilitate TrA and quad recruitment to improve stability w/erect posture during gait/standing          Gait Training - Justified to address any of the following:    Ambulation including weight bearing variation, gait sequencing, instruction/progression of an assistive device, and/or stair climbing.    Educated patient in proper heel toe gait pattern while using std cane in L hand and provided cueing for step thru pattern while walking with patient 60 ft x 4 in the gym. Discussed rationale and importance of proper gait for improvement in function and tolerance in ADLs.    Advised pt to resume use of walker if feels additional support is needed  Home Exercises   Access Code: [email or web address]  URL: [email or web address]  Date: 09/16/2024  Prepared by: Avelina Starks    Exercises  - Supine Transversus Abdominis Bracing - Hands on Stomach  - 2 x daily - 7 x weekly - 1 sets - 10 reps - 10 sec hold  - Supine Sciatic Nerve Glide  - 5 x daily - 7 x weekly - 1 sets - 30 reps  - Supine Heel Slide (Mirrored)  - 2 x daily - 7 x weekly - 2 sets - 10 reps  - Supine Lower Trunk Rotation  - 2 x daily - 7 x weekly - 1 sets - 20 reps  - Seated Long Arc Quad with Ankle Weight (Mirrored)  - 1 x daily - 5 x weekly - 3 sets - 10 reps  - Seated Long Arc Quad with Ankle Weight  - 1 x daily - 5 x weekly - 3 sets - 10 reps  - Hip Swing  - 1 x daily - 7 x weekly - 3 sets - 10 reps  - Hip Swing (Mirrored)  - 1 x daily - 7 x weekly - 3 sets - 10 reps  - Supine Hip External Rotation  - 1 x daily - 7 x weekly - 3 sets - 10 reps  - Supine Hip External Rotation (Mirrored)  - 1 x daily - 7 x weekly - 3 sets - 10 reps  - Seated Hip Flexion March with Ankle Weights  - 1 x daily - 7 x weekly - 3 sets - 10 reps  - Seated Hip Flexion March with  Ankle Weights  - 1 x daily - 7 x weekly - 3 sets - 10 reps          Assessment      Improved functional outcomes with decreased pain reported - goal met  Improved ability to ambulate w/std cane in L hand with demonstration, verbal cues and practice however fatigued quickly and tends to lean forward from waist.    Patient requires continued skilled care to: improve ROM, strength and stability so pt can return to PLOF  Plan   Continue with POC  Reassess ROM  Progress hip/knee strengthening/stability as tol  MT - hip mobs                    [name], PT

## 2024-10-08 ENCOUNTER — Encounter (INDEPENDENT_AMBULATORY_CARE_PROVIDER_SITE_OTHER): Payer: Self-pay | Admitting: Family Medicine

## 2024-10-08 ENCOUNTER — Inpatient Hospital Stay: Admitting: Rehabilitative and Restorative Service Providers"

## 2024-10-08 ENCOUNTER — Ambulatory Visit (INDEPENDENT_AMBULATORY_CARE_PROVIDER_SITE_OTHER): Admitting: Family Medicine

## 2024-10-08 VITALS — BP 106/67 | HR 91

## 2024-10-08 DIAGNOSIS — M5416 Radiculopathy, lumbar region: Secondary | ICD-10-CM

## 2024-10-08 DIAGNOSIS — M79605 Pain in left leg: Secondary | ICD-10-CM

## 2024-10-08 DIAGNOSIS — M1612 Unilateral primary osteoarthritis, left hip: Secondary | ICD-10-CM

## 2024-10-08 DIAGNOSIS — M5459 Other low back pain: Secondary | ICD-10-CM

## 2024-10-08 MED ORDER — TRIAMCINOLONE ACETONIDE 40 MG/ML IJ SUSP
40.0000 mg | Freq: Once | INTRAMUSCULAR | Status: AC
  Administered 2024-10-08: 11:00:00 40 mg via INTRA_ARTICULAR

## 2024-10-08 NOTE — Progress Notes (Signed)
 Encounter Diagnosis   Name Primary?    Primary osteoarthritis of left hip Yes     ULTRASOUND GUIDED INJECTION  Verbal informed consent was obtained prior to the procedure. Prior to this, the risks and side effects were discussed, including, but not limited to pain, infection, allergic reaction, bleeding, damage to tissues, vessels and nerves, skin atrophy, steroid flare.    Using the GE [employer] e [place] system, I performed a ULTRASOUND guided Injection of the left  hip via a in-plane approach after  chlorohexadine prep and needle localization with the 4C Probe in long axis using a 22 ga spinal needle, injecting 80mg   Kenalog   and 3 ml Lidocaine 1% w/o Epi. -----> 50 % relief of symptoms after injection. The patient tolerated the procedure well without complications. Permanent record of the images saved in the [employer] [place] system    The injection was performed for therapeutic purposes

## 2024-10-09 ENCOUNTER — Institutional Professional Consult (permissible substitution) (HOSPITAL_BASED_OUTPATIENT_CLINIC_OR_DEPARTMENT_OTHER): Admitting: Internal Medicine

## 2024-10-13 ENCOUNTER — Other Ambulatory Visit (INDEPENDENT_AMBULATORY_CARE_PROVIDER_SITE_OTHER): Payer: Self-pay | Admitting: Family

## 2024-10-13 NOTE — Telephone Encounter (Signed)
 Copied from CRM #[id number]. Topic: Clinical Support - Prescription Refill  >> Oct 13, 2024  8:35 AM [name]  H wrote:  Barbara Rubio called about Clinical Support - Prescription Refill.  Additional details:  Name, strength, directions of requested refill(s):  Wegovy  0.25 MG/0.5ML injection    How much medication is remaining: On the last one    Pharmacy to send refill to or patient to pick up rx from office (mark requested pharmacy in BOLD):    @PREFPHARMACY @  [facility] [address], [place] - [address]   Phone: [phone]  Fax: [phone]      Please mark X next to the preferred call back number:    Mobile: [phone] (mobile) X  Home: [phone] (home)   Work: @WORKPHONE @       Medication refill request, see above. Thank you   Patient has been informed that medication refill requests should be called in up to one week prior to running out of medication.    Additional Notes:    Next Visit: 03.18.2026

## 2024-10-14 MED ORDER — WEGOVY 0.25 MG/0.5ML SC SOAJ: 0.2500 mg | SUBCUTANEOUS | 0 refills | Status: DC

## 2024-10-14 NOTE — Telephone Encounter (Signed)
 Prescription Refill Checklist    Completed Process Reviewed Notes   [x]  Verified med on pt chart.    [x]  2.  Reviewed patient allergies    [x]  3.  Review last encounters since OV Office Visit with [place], Ifeoma J, MD (08/18/2024)    [x]  4.  Determine if pt has f/u as instructed or needs OV    []  5.  If OV required, pend 90 day supply of medication    [x]  6.  Review labs are up to date.    []  7. Filled by RN 90 day x 0 May only be done 1 x yearly   DATE:         Reviewed -  NN

## 2024-10-14 NOTE — Telephone Encounter (Signed)
 Called pt to notify her wegovy  rx pended. Once signed, will be released to pharmacy.

## 2024-10-20 NOTE — PT/OT Therapy Note (Unsigned)
 Name: Barbara Rubio Age: 73 y.o. Date of Service: 10/21/2024  Referring Physician: Tobie Hue, MD  Date of Injury: 05/24/2024  Plan of Care Dates From:10/21/2024 To: 12/20/2024  Date Treatment Started: 07/15/2024    Sessions in Plan of Care: 16       Visit Count: 9 /24  Diagnosis:    Diagnosis ICD-10-CM Associated Order   1. Other low back pain  M54.59       2. Left leg pain  M79.605       3. Radiculopathy, lumbar region  M54.16               Pertinent PMHx:  onset LBP w/L LE radic 05/24/24, insidious onset (woke w/pain L LE). Symptoms may have been triggered by sitting in bed the weekend prior working on computer. Symptoms have been progressively worsening over time.     MRI results:  MRI LEFT KNEE WITHOUT CONTRAST  HISTORY: Left knee pain for several years. History of arthritis.  COMPARISON: None.  TECHNIQUE: MRI of the left knee performed on a 1.5T scanner without  intravenous contrast.  CONTRAST: None.  FINDINGS:  Menisci:   Lateral meniscus: There is a degeneration and partial peripheral  longitudinal tear present within the anterior horn.     Medial meniscus: There is a chronic tear with moderate to high-grade  diffuse attenuation of the posterior horn, and moderate attenuation and  moderate to advanced extrusion of the body with intrasubstance signal  changes.     Ligaments: The ACL and PCL are intact. There is a chronic partial tear of  the MCL proximally as demonstrated by thickening and intermediate signal  alteration. The LCL complex is intact.     Muscles/Tendons: There is mild tendinosis of the quadriceps tendon. No  myositis demonstrated.     Cartilage: No osteochondral lesion demonstrated. Tricompartment  degenerative changes most significant at the medial tibiofemoral  compartment where there are focal high-grade cartilage defects with minimal  subchondral edema and tiny subchondral cysts. There is a small focal  high-grade cartilage defect at the lateral femoral condyle.     Bone: No acute  fracture demonstrated.  No suspicious marrow signal  abnormality demonstrated.       Joint: There is trace joint effusion. No loose body is identified.     IMPRESSION:      1.  Chronic tear of the medial meniscus with moderate to high-grade  attenuation of the posterior horn and extrusion of the body.     2.  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     3.  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     4.  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     5.  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     6.  Mild tendinosis of the quadriceps tendon.     Electronically signed by: Derick Daunt M.D.  [facility]  AB: 08/15/24       Subjective     Daily Subjective   Pt reports feeling somewhat better. Able to walk about 20 min while leaning on grocery cart. Greater ease to transfer sit to stand.      Social Support/Occupation    Lives in: one story house    Lives with: alone    Occupation: [employer] - currently not working  Precautions: No data was found  Allergies: Patient has no known allergies.    Objective    Outcomes:  Goal: 51 Initial  07/15/2024  08/19/24    09/09/24 10/03/24 10/21/24   Primary Functional Status Measure 29  29  43 60   57   Oswestry 13.4  13.4      45.3    Pain: /10 /10 /10 /10 /10   Pain last 24 hrs 7  8      3    Least pain last 30 days 6  7      2    Greatest pain last 30 days 7  9     5     PSFS Activities: /10 /10 /10 /10 /10   1. walk 3 6      8     2. Get Dressed, Shower, [employer] 4  6      7    3. Sleep at night..Pain intensify while in bed sleeping.Wake me up every night@3 :00 am  2  6      4      Lumbar AROM  Initial   08/19/24              Flexion  55           Extension  0           Side Bend R  20 p! LB           Side Bend L  25           Rotation R  WNL           Rotation L  WNL p! L groin            (blank fields were intentionally left blank)    Hip  ROM  Initial  10/08/24  R  A/PROM Initial  10/08/24  L A/PROM   R A/PROM   L A/PROM   R A/PROM   L A/PROM   R A/PROM   L A/PROM   R A/PROM   L A/PROM   Flex 90 73 p!           Ext             Abd             IR 23 stretch 14 p!           ER 39 41           (blank fields were intentionally left blank)       LE Strength  (lbs) Initial  07/15/2024   R Initial  07/15/2024    L  10/21/24  R 10/21/24   L    R    L    R    L    R    L   Hip Flex Seated (L1/2) 16.4 9.0 27.9   24.2               Hip Flex Supine (L1/2)                       Hip Ext Knee Flex                       Hip Ext Knee Ext                       Hip Abd 3.5 0  5.8  5.8  Hip Add                       Hip IR                       Hip ER                       Quads (L3) 29.8 13.6 43   43.8               HS Seated (S1) 20 13.6  32.5  30.3               HS Prone (S1)                       Ankle DF (L4)                       Ankle PF                       Toe Ext (L5)                       (blank fields were intentionally left blank)      Flexibility  Initial R    Initial L    R L R L   Hamstrings               Quads               Gastrocs               (blank fields were intentionally left blank)     Balance (sec) Initial               SLS R: Eyes Open (EO):             SLS R: Eyes Closed (EC):             SLS L: Eyes Open (EO):             SLS L: Eyes Closed (EC):             (blank fields were intentionally left blank)                             Treatment     Therapeutic Exercises - Justified to address any of the following:  To develop strength, endurance, ROM and/or flexibility.   recumbent bike L2 x 6 min w/subjective discussion and to prepare for session.     Foto, ROM, strength assessment w/discussion of results - see above    Instructed pt in correct performance of and to perform side lying hip abd and clam shells consitently    Gait Training - Justified to address any of the  following:    Ambulation including weight bearing variation, gait sequencing, instruction/progression of an assistive device, and/or stair climbing.              Home Exercises   Access Code: [email or web address]  URL: [email or web address]  Date: 09/16/2024  Prepared by: Avelina Starks    Exercises  - Supine Transversus Abdominis Bracing - Hands on Stomach  - 2 x daily - 7 x weekly - 1 sets - 10 reps - 10 sec hold  - Supine Sciatic Nerve Glide  - 5 x daily - 7  x weekly - 1 sets - 30 reps  - Supine Heel Slide (Mirrored)  - 2 x daily - 7 x weekly - 2 sets - 10 reps  - Supine Lower Trunk Rotation  - 2 x daily - 7 x weekly - 1 sets - 20 reps  - Seated Long Arc Quad with Ankle Weight (Mirrored)  - 1 x daily - 5 x weekly - 3 sets - 10 reps  - Seated Long Arc Quad with Ankle Weight  - 1 x daily - 5 x weekly - 3 sets - 10 reps  - Hip Swing  - 1 x daily - 7 x weekly - 3 sets - 10 reps  - Hip Swing (Mirrored)  - 1 x daily - 7 x weekly - 3 sets - 10 reps  - Supine Hip External Rotation  - 1 x daily - 7 x weekly - 3 sets - 10 reps  - Supine Hip External Rotation (Mirrored)  - 1 x daily - 7 x weekly - 3 sets - 10 reps  - Seated Hip Flexion March with Ankle Weights  - 1 x daily - 7 x weekly - 3 sets - 10 reps  - Seated Hip Flexion March with Ankle Weights  - 1 x daily - 7 x weekly - 3 sets - 10 reps    ---      Flowsheet Row ---   Total Time    Timed Minutes 38 minutes   Total Time 38 minutes           Assessment      Patient has been seen for 9 visits in this case from 07/15/2024 until 10/21/2024. The primary encounter diagnosis was Other low back pain. Diagnoses of Left leg pain and Radiculopathy, lumbar region were also pertinent to this visit.  Patient has made good progress in pain, strength, and functional stability since the initial evaluation. Patient is still demonstrating impairment in pain, range of motion, strength, functional stability, joint mobility, static balance, and dynamic balance. Patients outcome measure has  improved  since the initial evaluation. 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     Patient would benefit from continued skilled PT intervention to address these deficits and achieve the below listed goals.       Patient requires continued skilled care to: improve ROM, strength and stability so pt can return to PLOF  Plan   Continue with [facility]  Recommend pt use 2 or 4 wheeled walker when ambulating  Progress hip/knee strengthening/stability as tol        Goals      Goal 1: Patient will demonstrate independence in prescribed initial HEP with proper form, sets and reps for safe completion in conjunction with PT POC      09/16/24: reports compliance  pk   Sessions: 6      Goal 2: Improve Physical FS Primary Measure from 29 to 51 or better to demonstrate change for significant functional improvement.     08/19/24: no change (29)  pk  09/09/24: progressed (43)  pk  10/21/24: progressed (57)  pk     Sessions: 24      Goal 3: Patient will demonstrate independence in prescribed HEP with proper form, sets and reps for safe discharge to an independent program.    09/16/24: reports compliance  pk  10/21/24: compliant/ongoing  pk   Sessions: 24      Goal 4:  Increase knee ext strength to 40 lb to allow patient to transfer sit <-> stand from a regular toilet seat and chair.    10/21/24: progressed/MET - see obj, able to transfer w/greater ease  pk   Sessions: 24          Goal 5: Increase hip abduction strength to 20 lb to allow for ambulation > 20 minutes.    10/21/24: progressed - see obj, pt able to ambulate approximately 20 min while leaning on grocery cart  pk   Sessions: 24                                       [name], PT

## 2024-10-21 ENCOUNTER — Inpatient Hospital Stay: Admitting: Rehabilitative and Restorative Service Providers"

## 2024-10-21 DIAGNOSIS — M5416 Radiculopathy, lumbar region: Secondary | ICD-10-CM

## 2024-10-21 DIAGNOSIS — M79605 Pain in left leg: Secondary | ICD-10-CM

## 2024-10-21 DIAGNOSIS — M5459 Other low back pain: Secondary | ICD-10-CM

## 2024-10-21 NOTE — Progress Notes (Signed)
 Name: Barbara Rubio Age: 73 y.o. Date of Service: 10/21/2024  Referring Physician: Tobie Hue, MD  Date of Injury: 05/24/2024  Plan of Care Dates From:10/21/2024 To: 12/20/2024  Date Treatment Started: 07/15/2024    Sessions in Plan of Care: 16       Visit Count: 9   Diagnosis:    Diagnosis ICD-10-CM Associated Order   1. Other low back pain  M54.59       2. Left leg pain  M79.605       3. Radiculopathy, lumbar region  M54.16           Subjective     Daily Subjective   Pt reports feeling somewhat better. Able to walk about 20 min while leaning on grocery cart. Greater ease to transfer sit to stand.    Social Support/Occupation    Lives in: one story house    Lives with: alone    Occupation: [employer] - currently not working           Precautions: No data was found  Allergies: Allergies[1]    Medical History[2]    Objective    Outcomes:  Goal: 51 Initial  07/15/2024  08/19/24    09/09/24 10/03/24 10/21/24   Primary Functional Status Measure 29  29  43 60   57   Oswestry 13.4  13.4      45.3    Pain: /10 /10 /10 /10 /10   Pain last 24 hrs 7  8      3    Least pain last 30 days 6  7      2    Greatest pain last 30 days 7  9     5     PSFS Activities: /10 /10 /10 /10 /10   1. walk 3 6      8     2. Get Dressed, Shower, Barbara Rubio 4  6      7    3. Sleep at night..Pain intensify while in bed sleeping.Wake me up every night@3 :00 am  2  6      4      Lumbar AROM  Initial   08/19/24              Flexion  55           Extension  0           Side Bend R  20 p! LB           Side Bend L  25           Rotation R  WNL           Rotation L  WNL p! L groin           (blank fields were intentionally left blank)    Hip  ROM  Initial  10/08/24  R  A/PROM Initial  10/08/24  L A/PROM   R A/PROM   L A/PROM   R A/PROM   L A/PROM   R A/PROM   L A/PROM   R A/PROM   L A/PROM   Flex 90 73 p!           Ext             Abd             IR 23 stretch 14 p!           ER 39 41           (  blank fields were intentionally left blank)        LE Strength  (lbs) Initial  07/15/2024   R Initial  07/15/2024    L  10/21/24  R 10/21/24   L    R    L    R    L    R    L   Hip Flex Seated (L1/2) 16.4 9.0 27.9   24.2               Hip Flex Supine (L1/2)                       Hip Ext Knee Flex                       Hip Ext Knee Ext                       Hip Abd 3.5 0  5.8  5.8               Hip Add                       Hip IR                       Hip ER                       Quads (L3) 29.8 13.6 43   43.8               HS Seated (S1) 20 13.6  32.5  30.3               HS Prone (S1)                       Ankle DF (L4)                       Ankle PF                       Toe Ext (L5)                       (blank fields were intentionally left blank)                            Assessment      Patient has been seen for 9 visits in this case from 07/15/2024 until 10/21/2024. The primary encounter diagnosis was Other low back pain. Diagnoses of Left leg pain and Radiculopathy, lumbar region were also pertinent to this visit.  Patient has made good progress in pain, strength, and functional stability since the initial evaluation. Patient is still demonstrating impairment in pain, range of motion, strength, functional stability, joint mobility, static balance, and dynamic balance. Patients outcome measure has improved  since the initial evaluation. 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     Pt would benefit from continued care to achieve goals.  Prognosis: good  Plan   Visits per week: 2  Number of Sessions: 16  Direct One on One  02889: Therapeutic Exercise: To Develop Strength and Endurance, ROM and Flexibility  [id number]: Gait Training  02887: Neuromuscular Reeducation  97535:  Self Care/Home Mgmt Training (ADLs, safety procedures, use of assistive devices)  97140: Manual Therapy techniques (mobilization, manipulation, manual traction)  97530: Therapeutic Activities: Dynamic activities to improve functional  performance  Dry Needling    Recommendations:   Extend Original Plan of Care to address the above impairments / functional limitations.  Justification for extension: Goals not met         Goals      Goal 1: Patient will demonstrate independence in prescribed initial HEP with proper form, sets and reps for safe completion in conjunction with PT POC      09/16/24: reports compliance  pk   Sessions: 6      Goal 2: Improve Physical FS Primary Measure from 29 to 51 or better to demonstrate change for significant functional improvement.     08/19/24: no change (29)  pk  09/09/24: progressed (43)  pk  10/21/24: progressed (57)  pk     Sessions: 24      Goal 3: Patient will demonstrate independence in prescribed HEP with proper form, sets and reps for safe discharge to an independent program.    09/16/24: reports compliance  pk  10/21/24: compliant/ongoing  pk   Sessions: 24      Goal 4: Increase knee ext strength to 40 lb to allow patient to transfer sit <-> stand from a regular toilet seat and chair.    10/21/24: progressed/MET - see obj, able to transfer w/greater ease  pk   Sessions: 24          Goal 5: Increase hip abduction strength to 20 lb to allow for ambulation > 20 minutes.    10/21/24: progressed - see obj, pt able to ambulate approximately 20 min while leaning on grocery cart  pk   Sessions: 24                           Barbara Rubio, PT             [1] No Known Allergies  [2]   Past Medical History:  Diagnosis Date    Abnormal vision 10/23/1998    wears glasses    Arthritis     Constipation     HX - HAS BEEN MORE PREVELANT POST BARIATRIC SURGERY.    COVID-19 10/2021    HX - 21//23 URGENT CARE NOTE IN EPIC.    Difficulty walking 05/24/2024    Due to nerve pinched in spine & 2-3-4 lumbar in back spine area    Fibroids     PT HAD A PARTIAL HYSTERECTOMY - OVARIES ARE STILL IN PLACE.    Gastroesophageal reflux disease 10/23/2013    HX.    Hypercholesterolemia 03/23/2010    High Bad Cholesterol Level     Hyperlipidemia 10/23/2016    MANAGED WITH MEDS.    Hypertension 07/17/2024    PT STATES HE BP RUNS FROM 128-130/70s. MANAGED WITH MEDS - FOLLOWED BY PCP.    Left wrist fracture 2018    HX    Low back pain 05/24/2024    Pinched nerve & arthritis    LUQ pain     HX    Pain in joint 05/24/2024    Left knee, thigh and leg    Psoriatic arthritis (CMS/HCC)     HX - LOWER BACK, LEFT HAND, WRIST AND FINGERS    Urinary incontinence 03/23/2017    HX OVERACTIVE BLADDER. ISSUE RESOLVED POST BARIATRIC SURGERY - PT STATED  SHE LOST 65 LBS.

## 2024-10-21 NOTE — Care and Service Plan (Signed)
 Name:Barbara Rubio Age: 73 y.o.   Today's Date: 10/21/2024  Referring Physician: Tobie Hue, MD   Date of Injury: No data found 05/24/2024  PT Date Care Plan Established/Reviewed:10/21/2024  PT Date Treatment Started:07/15/2024       Sessions in Plan of Care: 16        Visit Count: 9   Diagnosis:    Diagnosis ICD-10-CM Associated Order   1. Other low back pain  M54.59       2. Left leg pain  M79.605       3. Radiculopathy, lumbar region  M54.16             Precautions: No data was found    Outcomes:  Goal: 51 Initial  07/15/2024  08/19/24    09/09/24 10/03/24 10/21/24   Primary Functional Status Measure 29  29  43 60   57   Oswestry 13.4  13.4      45.3    Pain: /10 /10 /10 /10 /10   Pain last 24 hrs 7  8      3    Least pain last 30 days 6  7      2    Greatest pain last 30 days 7  9     5     PSFS Activities: /10 /10 /10 /10 /10   1. walk 3 6      8     2. Get Dressed, Shower, [employer] 4  6      7    3. Sleep at night..Pain intensify while in bed sleeping.Wake me up every night@3 :00 am  2  6      4      Lumbar AROM  Initial   08/19/24              Flexion  55           Extension  0           Side Bend R  20 p! LB           Side Bend L  25           Rotation R  WNL           Rotation L  WNL p! L groin           (blank fields were intentionally left blank)    Hip  ROM  Initial  10/08/24  R  A/PROM Initial  10/08/24  L A/PROM   R A/PROM   L A/PROM   R A/PROM   L A/PROM   R A/PROM   L A/PROM   R A/PROM   L A/PROM   Flex 90 73 p!           Ext             Abd             IR 23 stretch 14 p!           ER 39 41           (blank fields were intentionally left blank)       LE Strength  (lbs) Initial  07/15/2024   R Initial  07/15/2024    L  10/21/24  R 10/21/24   L    R    L    R    L    R    L   Hip Flex Seated (L1/2) 16.4 9.0 27.9   24.2  Hip Flex Supine (L1/2)                       Hip Ext Knee Flex                       Hip Ext Knee Ext                       Hip Abd 3.5 0  5.8  5.8               Hip Add                        Hip IR                       Hip ER                       Quads (L3) 29.8 13.6 43   43.8               HS Seated (S1) 20 13.6  32.5  30.3               HS Prone (S1)                       Ankle DF (L4)                       Ankle PF                       Toe Ext (L5)                       (blank fields were intentionally left blank)                      Assessment     Patient has been seen for 9 visits in this case from 07/15/2024 until 10/21/2024. The primary encounter diagnosis was Other low back pain. Diagnoses of Left leg pain and Radiculopathy, lumbar region were also pertinent to this visit.  Patient has made good progress in pain, strength, and functional stability since the initial evaluation. Patient is still demonstrating impairment in pain, range of motion, strength, functional stability, joint mobility, static balance, and dynamic balance. Patients outcome measure has improved  since the initial evaluation. 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     Pt would benefit from continued care to achieve goals.  Prognosis: good  Plan   Visits per week: 2  Number of Sessions: 16  Direct One on One  02889: Therapeutic Exercise: To Develop Strength and Endurance, ROM and Flexibility  [id number]: Gait Training  02887: Neuromuscular Reeducation  [phone]: Self Care/Home Mgmt Training (ADLs, safety procedures, use of assistive devices)  97140: Manual Therapy techniques (mobilization, manipulation, manual traction)  97530: Therapeutic Activities: Dynamic activities to improve functional performance  Dry Needling    Recommendations:   Extend Original Plan of Care to address the above impairments / functional limitations.  Justification for extension: Goals not met         Goals      Goal 1: Patient will demonstrate independence in prescribed initial HEP with proper form, sets and reps for safe completion in conjunction  with PT POC      09/16/24: reports  compliance  pk   Sessions: 6      Goal 2: Improve Physical FS Primary Measure from 29 to 51 or better to demonstrate change for significant functional improvement.     08/19/24: no change (29)  pk  09/09/24: progressed (43)  pk  10/21/24: progressed (57)  pk     Sessions: 24      Goal 3: Patient will demonstrate independence in prescribed HEP with proper form, sets and reps for safe discharge to an independent program.    09/16/24: reports compliance  pk  10/21/24: compliant/ongoing  pk   Sessions: 24      Goal 4: Increase knee ext strength to 40 lb to allow patient to transfer sit <-> stand from a regular toilet seat and chair.    10/21/24: progressed/MET - see obj, able to transfer w/greater ease  pk   Sessions: 24          Goal 5: Increase hip abduction strength to 20 lb to allow for ambulation > 20 minutes.    10/21/24: progressed - see obj, pt able to ambulate approximately 20 min while leaning on grocery cart  pk   Sessions: 24                           [name], PT

## 2024-10-22 MED ORDER — WEGOVY 0.25 MG/0.5ML SC SOAJ: 0.2500 mg | SUBCUTANEOUS | 1 refills | Status: AC

## 2024-10-22 NOTE — Addendum Note (Signed)
 Addended by: Jermanie Minshall on: 10/22/2024 11:00 AM     Modules accepted: Orders

## 2024-10-22 NOTE — Telephone Encounter (Signed)
 Prescription Refill Checklist    Completed Process Reviewed Notes   [x]  Verified med on pt chart.    [x]  2.  Reviewed patient allergies    [x]  3.  Review last encounters since OV No dosing changes since 10/27 OV. Pt confirmed she is taking 0.25mg  weekly   [x]  4.  Determine if pt has f/u as instructed or needs OV Appt with NP Jimmy on 01/07/2025   []  5.  If OV required, pend 90 day supply of medication    [x]  6.  Review labs are up to date. 08/12/2024   []  7. Filled by RN 90 day x 0 May only be done 1 x yearly   DATE:         Reviewed -  JM  Pt called requesting refill for Wegovy  0.25mg  for next year.

## 2024-10-30 ENCOUNTER — Encounter (INDEPENDENT_AMBULATORY_CARE_PROVIDER_SITE_OTHER): Payer: Self-pay

## 2024-10-31 ENCOUNTER — Encounter (INDEPENDENT_AMBULATORY_CARE_PROVIDER_SITE_OTHER): Payer: Self-pay

## 2024-11-03 NOTE — PT/OT Therapy Note (Unsigned)
 Name: Barbara Rubio Age: 74 y.o. Date of Service: 11/04/2024  Referring Physician: Tobie Hue, MD  Date of Injury: No data found  Plan of Care Dates From:No data found To: No data was found  Date Treatment Started: No data found    Sessions in Plan of Care: No data was found       Visit Count: Visit count could not be calculated. Make sure you are using a visit which is associated with an episode. /24  Diagnosis:   No diagnosis found.          Pertinent PMHx:  onset LBP w/L LE radic 05/24/24, insidious onset (woke w/pain L LE). Symptoms may have been triggered by sitting in bed the weekend prior working on computer. Symptoms have been progressively worsening over time.     MRI results:  MRI LEFT KNEE WITHOUT CONTRAST  HISTORY: Left knee pain for several years. History of arthritis.  COMPARISON: None.  TECHNIQUE: MRI of the left knee performed on a 1.5T scanner without  intravenous contrast.  CONTRAST: None.  FINDINGS:  Menisci:   Lateral meniscus: There is a degeneration and partial peripheral  longitudinal tear present within the anterior horn.     Medial meniscus: There is a chronic tear with moderate to high-grade  diffuse attenuation of the posterior horn, and moderate attenuation and  moderate to advanced extrusion of the body with intrasubstance signal  changes.     Ligaments: The ACL and PCL are intact. There is a chronic partial tear of  the MCL proximally as demonstrated by thickening and intermediate signal  alteration. The LCL complex is intact.     Muscles/Tendons: There is mild tendinosis of the quadriceps tendon. No  myositis demonstrated.     Cartilage: No osteochondral lesion demonstrated. Tricompartment  degenerative changes most significant at the medial tibiofemoral  compartment where there are focal high-grade cartilage defects with minimal  subchondral edema and tiny subchondral cysts. There is a small focal  high-grade cartilage defect at the lateral femoral condyle.     Bone: No acute  fracture demonstrated.  No suspicious marrow signal  abnormality demonstrated.       Joint: There is trace joint effusion. No loose body is identified.     IMPRESSION:      1.  Chronic tear of the medial meniscus with moderate to high-grade  attenuation of the posterior horn and extrusion of the body.     2.  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     3.  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     4.  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     5.  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     6.  Mild tendinosis of the quadriceps tendon.     Electronically signed by: Derick Daunt M.D.  [facility]  AB: 08/15/24       Subjective     Daily Subjective   Pt reports feeling somewhat better. Able to walk about 20 min while leaning on grocery cart. Greater ease to transfer sit to stand.      Social Support/Occupation    Lives in: one story house    Lives with: alone    Occupation: [employer] - currently not working           Precautions: No data was found  Allergies: Patient  has no known allergies.    Objective    Outcomes:  Goal: 51 Initial  07/15/2024  08/19/24    09/09/24 10/03/24 10/21/24   Primary Functional Status Measure 29  29  43 60   57   Oswestry 13.4  13.4      45.3    Pain: /10 /10 /10 /10 /10   Pain last 24 hrs 7  8      3    Least pain last 30 days 6  7      2    Greatest pain last 30 days 7  9     5     PSFS Activities: /10 /10 /10 /10 /10   1. walk 3 6      8     2. Get Dressed, Shower, [employer] 4  6      7    3. Sleep at night..Pain intensify while in bed sleeping.Wake me up every night@3 :00 am  2  6      4      Lumbar AROM  Initial   08/19/24              Flexion  55           Extension  0           Side Bend R  20 p! LB           Side Bend L  25           Rotation R  WNL           Rotation L  WNL p! L groin            (blank fields were intentionally left blank)    Hip  ROM  Initial  10/08/24  R  A/PROM Initial  10/08/24  L A/PROM   R A/PROM   L A/PROM   R A/PROM   L A/PROM   R A/PROM   L A/PROM   R A/PROM   L A/PROM   Flex 90 73 p!           Ext             Abd             IR 23 stretch 14 p!           ER 39 41           (blank fields were intentionally left blank)       LE Strength  (lbs) Initial  07/15/2024   R Initial  07/15/2024    L  10/21/24  R 10/21/24   L    R    L    R    L    R    L   Hip Flex Seated (L1/2) 16.4 9.0 27.9   24.2               Hip Flex Supine (L1/2)                       Hip Ext Knee Flex                       Hip Ext Knee Ext                       Hip Abd 3.5 0  5.8  5.8  Hip Add                       Hip IR                       Hip ER                       Quads (L3) 29.8 13.6 43   43.8               HS Seated (S1) 20 13.6  32.5  30.3               HS Prone (S1)                       Ankle DF (L4)                       Ankle PF                       Toe Ext (L5)                       (blank fields were intentionally left blank)      Flexibility  Initial R    Initial L    R L R L   Hamstrings               Quads               Gastrocs               (blank fields were intentionally left blank)     Balance (sec) Initial               SLS R: Eyes Open (EO):             SLS R: Eyes Closed (EC):             SLS L: Eyes Open (EO):             SLS L: Eyes Closed (EC):             (blank fields were intentionally left blank)                             Treatment     Therapeutic Exercises - Justified to address any of the following:  To develop strength, endurance, ROM and/or flexibility.   recumbent bike L2 x 6 min w/subjective discussion and to prepare for session.     Foto, ROM, strength assessment w/discussion of results - see above    Instructed pt in correct performance of and to perform side lying hip abd and clam shells consitently    Gait Training - Justified to address any of the  following:    Ambulation including weight bearing variation, gait sequencing, instruction/progression of an assistive device, and/or stair climbing.              Home Exercises   Access Code: [email or web address]  URL: [email or web address]  Date: 09/16/2024  Prepared by: Avelina Starks    Exercises  - Supine Transversus Abdominis Bracing - Hands on Stomach  - 2 x daily - 7 x weekly - 1 sets - 10 reps - 10 sec hold  - Supine Sciatic Nerve Glide  - 5 x daily - 7  x weekly - 1 sets - 30 reps  - Supine Heel Slide (Mirrored)  - 2 x daily - 7 x weekly - 2 sets - 10 reps  - Supine Lower Trunk Rotation  - 2 x daily - 7 x weekly - 1 sets - 20 reps  - Seated Long Arc Quad with Ankle Weight (Mirrored)  - 1 x daily - 5 x weekly - 3 sets - 10 reps  - Seated Long Arc Quad with Ankle Weight  - 1 x daily - 5 x weekly - 3 sets - 10 reps  - Hip Swing  - 1 x daily - 7 x weekly - 3 sets - 10 reps  - Hip Swing (Mirrored)  - 1 x daily - 7 x weekly - 3 sets - 10 reps  - Supine Hip External Rotation  - 1 x daily - 7 x weekly - 3 sets - 10 reps  - Supine Hip External Rotation (Mirrored)  - 1 x daily - 7 x weekly - 3 sets - 10 reps  - Seated Hip Flexion March with Ankle Weights  - 1 x daily - 7 x weekly - 3 sets - 10 reps  - Seated Hip Flexion March with Ankle Weights  - 1 x daily - 7 x weekly - 3 sets - 10 reps              Assessment      Patient has been seen for 9 visits in this case from 07/15/2024 until 10/21/2024. The primary encounter diagnosis was Other low back pain. Diagnoses of Left leg pain and Radiculopathy, lumbar region were also pertinent to this visit.  Patient has made good progress in pain, strength, and functional stability since the initial evaluation. Patient is still demonstrating impairment in pain, range of motion, strength, functional stability, joint mobility, static balance, and dynamic balance. Patients outcome measure has improved  since the initial evaluation. 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     Patient would benefit from continued skilled PT intervention to address these deficits and achieve the below listed goals.       Patient requires continued skilled care to: improve ROM, strength and stability so pt can return to PLOF  Plan   Continue with [facility]  Recommend pt use 2 or 4 wheeled walker when ambulating  Progress hip/knee strengthening/stability as tol                          [name], PT

## 2024-11-04 ENCOUNTER — Inpatient Hospital Stay
Attending: Student in an Organized Health Care Education/Training Program | Admitting: Rehabilitative and Restorative Service Providers"

## 2024-11-04 DIAGNOSIS — M79605 Pain in left leg: Secondary | ICD-10-CM | POA: Insufficient documentation

## 2024-11-04 DIAGNOSIS — M5416 Radiculopathy, lumbar region: Secondary | ICD-10-CM | POA: Insufficient documentation

## 2024-11-04 DIAGNOSIS — M5459 Other low back pain: Secondary | ICD-10-CM | POA: Insufficient documentation

## 2024-11-06 ENCOUNTER — Telehealth (INDEPENDENT_AMBULATORY_CARE_PROVIDER_SITE_OTHER): Payer: Self-pay

## 2024-11-06 ENCOUNTER — Inpatient Hospital Stay: Admitting: Rehabilitative and Restorative Service Providers"

## 2024-11-06 NOTE — Telephone Encounter (Signed)
 I called and informed patient that the provider said she can take both of those supplements. If/when she gets scheduled for surgery, she would just have to hold any vitamins/sups 7 days prior to surgery, voiced understanding and appreciation.

## 2024-11-06 NOTE — Telephone Encounter (Signed)
 Patient called that because Dr Elaine is taking care of her osteo and she wanted to know if she can take Tumeric 1000 mg and magnesium 420 mg, considering the fact that she might be having surgery and does not want anything that will be contraindicated with upcoming surgery.  I informed them that I will send a message to the provider and will get back to them with an advice, voiced understanding. Message sent to the provider.

## 2024-11-10 ENCOUNTER — Emergency Department

## 2024-11-10 ENCOUNTER — Inpatient Hospital Stay

## 2024-11-10 ENCOUNTER — Inpatient Hospital Stay
Admission: EM | Admit: 2024-11-10 | Discharge: 2024-11-13 | DRG: 175 | Disposition: A | Discharge status: Home Health | Attending: Internal Medicine | Admitting: Internal Medicine

## 2024-11-10 DIAGNOSIS — Z6829 Body mass index (BMI) 29.0-29.9, adult: Secondary | ICD-10-CM

## 2024-11-10 DIAGNOSIS — K449 Diaphragmatic hernia without obstruction or gangrene: Secondary | ICD-10-CM | POA: Diagnosis present

## 2024-11-10 DIAGNOSIS — I82451 Acute embolism and thrombosis of right peroneal vein: Secondary | ICD-10-CM | POA: Diagnosis present

## 2024-11-10 DIAGNOSIS — J9601 Acute respiratory failure with hypoxia: Secondary | ICD-10-CM | POA: Diagnosis present

## 2024-11-10 DIAGNOSIS — M5430 Sciatica, unspecified side: Secondary | ICD-10-CM | POA: Diagnosis present

## 2024-11-10 DIAGNOSIS — Z7901 Long term (current) use of anticoagulants: Secondary | ICD-10-CM

## 2024-11-10 DIAGNOSIS — E7800 Pure hypercholesterolemia, unspecified: Secondary | ICD-10-CM | POA: Diagnosis present

## 2024-11-10 DIAGNOSIS — R079 Chest pain, unspecified: Secondary | ICD-10-CM

## 2024-11-10 DIAGNOSIS — I82441 Acute embolism and thrombosis of right tibial vein: Secondary | ICD-10-CM | POA: Diagnosis present

## 2024-11-10 DIAGNOSIS — Z9884 Bariatric surgery status: Secondary | ICD-10-CM

## 2024-11-10 DIAGNOSIS — I509 Heart failure, unspecified: Secondary | ICD-10-CM

## 2024-11-10 DIAGNOSIS — Z8616 Personal history of COVID-19: Secondary | ICD-10-CM

## 2024-11-10 DIAGNOSIS — M179 Osteoarthritis of knee, unspecified: Secondary | ICD-10-CM | POA: Diagnosis present

## 2024-11-10 DIAGNOSIS — I10 Essential (primary) hypertension: Secondary | ICD-10-CM | POA: Diagnosis present

## 2024-11-10 DIAGNOSIS — I82411 Acute embolism and thrombosis of right femoral vein: Secondary | ICD-10-CM | POA: Diagnosis present

## 2024-11-10 DIAGNOSIS — R0902 Hypoxemia: Secondary | ICD-10-CM

## 2024-11-10 DIAGNOSIS — R0682 Tachypnea, not elsewhere classified: Secondary | ICD-10-CM

## 2024-11-10 DIAGNOSIS — E66811 Obesity, class 1: Secondary | ICD-10-CM | POA: Diagnosis present

## 2024-11-10 DIAGNOSIS — I2729 Other secondary pulmonary hypertension: Secondary | ICD-10-CM | POA: Diagnosis present

## 2024-11-10 DIAGNOSIS — D6859 Other primary thrombophilia: Secondary | ICD-10-CM | POA: Diagnosis present

## 2024-11-10 DIAGNOSIS — M169 Osteoarthritis of hip, unspecified: Secondary | ICD-10-CM | POA: Diagnosis present

## 2024-11-10 DIAGNOSIS — I2699 Other pulmonary embolism without acute cor pulmonale: Secondary | ICD-10-CM

## 2024-11-10 DIAGNOSIS — I5A Non-ischemic myocardial injury (non-traumatic): Secondary | ICD-10-CM | POA: Diagnosis present

## 2024-11-10 DIAGNOSIS — I2692 Saddle embolus of pulmonary artery without acute cor pulmonale: Principal | ICD-10-CM | POA: Diagnosis present

## 2024-11-10 DIAGNOSIS — R351 Nocturia: Secondary | ICD-10-CM | POA: Diagnosis present

## 2024-11-10 DIAGNOSIS — Z79899 Other long term (current) drug therapy: Secondary | ICD-10-CM

## 2024-11-10 DIAGNOSIS — I2602 Saddle embolus of pulmonary artery with acute cor pulmonale: Principal | ICD-10-CM | POA: Diagnosis present

## 2024-11-10 DIAGNOSIS — I82431 Acute embolism and thrombosis of right popliteal vein: Secondary | ICD-10-CM | POA: Diagnosis present

## 2024-11-10 DIAGNOSIS — K219 Gastro-esophageal reflux disease without esophagitis: Secondary | ICD-10-CM | POA: Diagnosis present

## 2024-11-10 LAB — ECG 12-LEAD
Atrial Rate: 109 {beats}/min
P Axis: 52 degrees
P-R Interval: 170 ms
Q-T Interval: 330 ms
QRS Duration: 82 ms
QTC Calculation (Bezet): 444 ms
R Axis: -19 degrees
T Axis: 32 degrees
Ventricular Rate: 109 {beats}/min

## 2024-11-10 LAB — LAB USE ONLY - CBC WITH DIFFERENTIAL
Absolute Basophils: 0.03 x10 3/uL (ref 0.00–0.08)
Absolute Eosinophils: 0.08 x10 3/uL (ref 0.00–0.44)
Absolute Immature Granulocytes: 0.06 x10 3/uL (ref 0.00–0.07)
Absolute Lymphocytes: 1.53 x10 3/uL (ref 0.42–3.22)
Absolute Monocytes: 0.6 x10 3/uL (ref 0.21–0.85)
Absolute Neutrophils: 5.38 x10 3/uL (ref 1.10–6.33)
Absolute nRBC: 0 x10 3/uL (ref <=0.00)
Basophils %: 0.4 %
Eosinophils %: 1 %
Hematocrit: 41 % (ref 34.7–43.7)
Hemoglobin: 12.7 g/dL (ref 11.4–14.8)
Immature Granulocytes %: 0.8 %
Lymphocytes %: 19.9 %
MCH: 27.3 pg (ref 25.1–33.5)
MCHC: 31 g/dL — ABNORMAL LOW (ref 31.5–35.8)
MCV: 88.2 fL (ref 78.0–96.0)
MPV: 11.6 fL (ref 8.9–12.5)
Monocytes %: 7.8 %
Neutrophils %: 70.1 %
Platelet Count: 172 x10 3/uL (ref 142–346)
Preliminary Absolute Neutrophil Count: 5.38 x10 3/uL (ref 1.10–6.33)
RBC: 4.65 x10 6/uL (ref 3.90–5.10)
RDW: 15 % (ref 11–15)
WBC: 7.68 x10 3/uL (ref 3.10–9.50)
nRBC %: 0 /100{WBCs} (ref <=0.0)

## 2024-11-10 LAB — COMPREHENSIVE METABOLIC PANEL
ALT: 11 U/L (ref <=55)
AST (SGOT): 18 U/L (ref <=41)
Albumin/Globulin Ratio: 0.9 (ref 0.9–2.2)
Albumin: 3.3 g/dL — ABNORMAL LOW (ref 3.5–4.9)
Alkaline Phosphatase: 75 U/L (ref 37–117)
Anion Gap: 9 (ref 5.0–15.0)
BUN: 7 mg/dL (ref 7–21)
Bilirubin, Total: 0.7 mg/dL (ref 0.2–1.2)
CO2: 25 meq/L (ref 17–29)
Calcium: 9.2 mg/dL (ref 7.9–10.2)
Chloride: 105 meq/L (ref 99–111)
Creatinine: 0.8 mg/dL (ref 0.4–1.0)
GFR: >60 mL/min/1.73 m2 (ref >=60.0)
Globulin: 3.7 g/dL — ABNORMAL HIGH (ref 2.0–3.6)
Glucose: 141 mg/dL — ABNORMAL HIGH (ref 70–100)
Potassium: 3.5 meq/L (ref 3.5–5.3)
Protein, Total: 7 g/dL (ref 6.0–8.3)
Sodium: 139 meq/L (ref 135–145)

## 2024-11-10 LAB — ECHO ADULT TTE COMPLETE
AV Area (Cont Eq VTI): 2.5266
AV Mean Gradient: 4
AV Peak Velocity: 1.35
Ao Root Diameter (2D): 3.4
BP Mod LV Ejection Fraction: 68
IVS Diastolic Thickness (2D): 0.99
LA Dimension (2D): 2.2
LA Volume Index (BP A-L): 13.7279
LVID diastole (2D): 3.33
LVID systole (2D): 2.17
MV E/A: 0.6371
MV E/e' (Average): 7.6782
Mitral Valve Findings: NORMAL
Prox Ascending Aorta Diameter: 3.3
RV Basal Diastolic Dimension: 3.24
RV Systolic Pressure: 46.1364
TAPSE: 1.48
Tricuspid Valve Findings: NORMAL

## 2024-11-10 LAB — ANTI-XA,UFH
Anti-Xa, UFH: <0.04 [IU]/mL
Anti-Xa, UFH: 1.48 [IU]/mL
Anti-Xa, UFH: 0.88 [IU]/mL

## 2024-11-10 LAB — HIGH SENSITIVITY TROPONIN-I: hs Troponin: 147.1 ng/L (ref <=14.0)

## 2024-11-10 LAB — COVID-19 (SARS-COV-2) & INFLUENZA  A/B, NAA (ROCHE LIAT)
Influenza A RNA: NOT DETECTED
Influenza B RNA: NOT DETECTED
SARS-CoV-2 (COVID-19) RNA: NOT DETECTED

## 2024-11-10 LAB — NT-PROBNP
NT-ProBNP: 450.9 pg/mL — ABNORMAL HIGH (ref <=334.1)
NT-ProBNP: 1837.9 pg/mL — ABNORMAL HIGH (ref <=334.1)

## 2024-11-10 LAB — APTT: PTT: 27 s (ref 27–39)

## 2024-11-10 LAB — LACTIC ACID: Whole Blood Lactic Acid: 1.8 mmol/L (ref 0.4–2.0)

## 2024-11-10 LAB — D-DIMER: D-Dimer: 15.03 ug{FEU}/mL — ABNORMAL HIGH (ref <0.50)

## 2024-11-10 LAB — WHOLE BLOOD GLUCOSE POCT: Whole Blood Glucose POCT: 115 mg/dL — ABNORMAL HIGH (ref 70–100)

## 2024-11-10 LAB — HIGH SENSITIVITY TROPONIN-I WITH DELTA
hs Troponin-I Delta: 124
hs Troponin: 22.9 ng/L — ABNORMAL HIGH (ref <=14.0)

## 2024-11-10 MED ORDER — FENTANYL CITRATE (PF) 50 MCG/ML IJ SOLN (WRAP)
25.0000 ug | Freq: Once | INTRAMUSCULAR | Status: AC
  Administered 2024-11-10: 25 ug via INTRAVENOUS
  Filled 2024-11-10: qty 1

## 2024-11-10 MED ORDER — PANTOPRAZOLE SODIUM 40 MG IV SOLR
40.0000 mg | Freq: Every day | INTRAVENOUS | Status: DC
  Administered 2024-11-10 – 2024-11-12 (×3): 40 mg via INTRAVENOUS
  Filled 2024-11-10 (×3): qty 40

## 2024-11-10 MED ORDER — ACETAMINOPHEN 500 MG PO TABS
1000.0000 mg | ORAL_TABLET | Freq: Four times a day (QID) | ORAL | Status: DC | PRN
  Administered 2024-11-10 – 2024-11-12 (×3): 1000 mg via ORAL
  Filled 2024-11-10 (×3): qty 2

## 2024-11-10 MED ORDER — HEPARIN SODIUM (PORCINE) 5000 UNIT/ML IJ SOLN: 80.0000 [IU]/kg | INTRAMUSCULAR | Status: DC | PRN

## 2024-11-10 MED ORDER — SENNOSIDES-DOCUSATE SODIUM 8.6-50 MG PO TABS
1.0000 | ORAL_TABLET | Freq: Two times a day (BID) | ORAL | Status: DC
  Administered 2024-11-10 – 2024-11-13 (×6): 1 via ORAL
  Filled 2024-11-10 (×6): qty 1

## 2024-11-10 MED ORDER — HEPARIN SODIUM (PORCINE) 5000 UNIT/ML IJ SOLN: 40.0000 [IU]/kg | INTRAMUSCULAR | Status: DC | PRN

## 2024-11-10 MED ORDER — ALBUTEROL SULFATE (2.5 MG/3ML) 0.083% IN NEBU
2.5000 mg | INHALATION_SOLUTION | Freq: Once | RESPIRATORY_TRACT | Status: AC
  Administered 2024-11-10: 2.5 mg via RESPIRATORY_TRACT
  Filled 2024-11-10: qty 3

## 2024-11-10 MED ORDER — IOHEXOL 350 MG/ML IV SOLN
70.0000 mL | Freq: Once | INTRAVENOUS | Status: AC | PRN
  Administered 2024-11-10: 70 mL via INTRAVENOUS
  Filled 2024-11-10: qty 100

## 2024-11-10 MED ORDER — FAMOTIDINE 10 MG/ML IV SOLN (WRAP)
20.0000 mg | Freq: Once | INTRAVENOUS | Status: AC
  Administered 2024-11-10: 20 mg via INTRAVENOUS
  Filled 2024-11-10: qty 2

## 2024-11-10 MED ORDER — ATORVASTATIN CALCIUM 20 MG PO TABS
20.0000 mg | ORAL_TABLET | Freq: Every day | ORAL | Status: DC
  Administered 2024-11-10 – 2024-11-13 (×4): 20 mg via ORAL
  Filled 2024-11-10 (×4): qty 1

## 2024-11-10 MED ORDER — IPRATROPIUM BROMIDE 0.02 % IN SOLN
0.5000 mg | Freq: Once | RESPIRATORY_TRACT | Status: AC
  Administered 2024-11-10: 0.5 mg via RESPIRATORY_TRACT
  Filled 2024-11-10: qty 2.5

## 2024-11-10 MED ORDER — HEPARIN SODIUM (PORCINE) 5000 UNIT/ML IJ SOLN
80.0000 [IU]/kg | Freq: Once | INTRAMUSCULAR | Status: AC
  Administered 2024-11-10: 7050 [IU] via INTRAVENOUS
  Filled 2024-11-10: qty 2

## 2024-11-10 MED ORDER — HEPARIN (PORCINE) IN D5W 50-5 UNIT/ML-% IV SOLN (UNITS/KG/HR ONLY)
18.0000 [IU]/kg/h | INTRAVENOUS | Status: DC
  Administered 2024-11-10: 14 [IU]/kg/h via INTRAVENOUS
  Administered 2024-11-10: 18 [IU]/kg/h via INTRAVENOUS
  Administered 2024-11-11 – 2024-11-12 (×2): 12 [IU]/kg/h via INTRAVENOUS
  Filled 2024-11-10 (×3): qty 500

## 2024-11-10 MED ORDER — ACETAMINOPHEN 500 MG PO TABS: 1000.0000 mg | ORAL_TABLET | ORAL | Status: DC | PRN

## 2024-11-10 MED ORDER — SODIUM CHLORIDE 0.9 % IV BOLUS
1000.0000 mL | Freq: Once | INTRAVENOUS | Status: AC
  Administered 2024-11-10: 1000 mL via INTRAVENOUS
  Filled 2024-11-10: qty 1000

## 2024-11-10 MED ORDER — ASPIRIN 81 MG PO CHEW
324.0000 mg | CHEWABLE_TABLET | Freq: Once | ORAL | Status: AC
  Administered 2024-11-10: 324 mg via ORAL
  Filled 2024-11-10: qty 4

## 2024-11-10 NOTE — Progress Notes (Signed)
 NURSE NOTE SUMMARY  [facility] [place] [facility]   Patient Name: Barbara Rubio   Attending Physician: Colan Moselle, Anahita*   Today's date:   11/10/2024 LOS: 0 days   Shift Summary:                                                              -admitted from ED  - afternoon able to start titrating down slightly on oxygen  - pt continued on heparin  drip  - fever early in shift for which tylenol  given  - pt had doppler and ECHO done as well       Provider Notifications:     na   Rapid Response Notifications:  Mobility:   na   PMP Activity: Step 3 - Bed Mobility; Step 5 - Chair (11/10/2024  6:00 PM)     Weight tracking:  Family Dynamic:   Last 3 Weights for the past 72 hrs (Last 3 readings):   Weight   11/10/24 0900 89.7 kg   11/10/24 0607 88 kg    No special circumstances.         Last Bowel Movement   No data recorded

## 2024-11-10 NOTE — Discharge Instr - AVS First Page (Addendum)
 Reason for your Hospital Admission:  ***      Instructions for after your discharge:  ***

## 2024-11-10 NOTE — ED Provider Notes (Addendum)
EMERGENCY DEPARTMENT HISTORY AND PHYSICAL EXAM    Date Time: 11/10/2024 9:13 AM  Patient Name: Barbara Rubio, 74 y.o., female  ED Provider: C. Donnajean Chesnut, MD.    History of Presenting Illness:     Chief Complaint: chest pain, sob  History obtained from: Patient.  Narrative/Additional Historical Findings:Barbara Rubio is a 74 y.o. female  with hx of htn, high cholesterol presents with chest pain, sob  History of Present Illness  Barbara Rubio is a 74 year old female who presents with chest pain and shortness of breath.    She experiences tight chest pains and shortness of breath, which are new symptoms for her. The chest pain and pressure began approximately 40 minutes prior to the visit, described as occurring 'out of the blue'. She describes a burning sensation in her chest, similar to indigestion, but has not taken any antacids since she hasn't eaten since 10 AM. A heart evaluation four months ago was normal.    She notes difficulty sleeping over the past three to four nights, waking up at 2 and 4 AM to urinate, but with minimal output.    No history of asthma, heart failure, blood clots, smoking, or recent travel outside the country. No recent fever, abdominal pain, vomiting, or new pain or swelling in her legs.    Her current medications include Wegovy , lisinopril  10 mg once daily, and a statin for high LDL levels, which she has been taking for two to three months.    She lives alone and recently visited her daughter and granddaughter in [place].    Nursing notes from this date of service were reviewed.    Past Medical History:   Medical History[1]    Past Surgical History:   Past Surgical History[2]    Family History:   Family History[3]    Social History:   Social History[4]    Allergies:   Allergies[5]    Medications:   Current Medications[6]    Review of Systems:   Review of Systems   Per hpi    Physical Exam:     ED Triage Vitals [11/10/24 0406]   Encounter Vitals Group       BP 145/80      Girls Systolic BP Percentile       Girls Diastolic BP Percentile       Boys Systolic BP Percentile       Boys Diastolic BP Percentile       Heart Rate (!) 115      Resp Rate 20      Temp 97.6 F (36.4 C)      Temp src Oral      SpO2 (!) 86 %      Weight       Height       Head Circumference       Peak Flow       Pain Score 10      Pain Loc       Pain Education       Exclude from Growth Chart      Physical Exam   Constitutional: Oriented to person, place, and time. Appears well-developed. mild distress.   Head: Normocephalic and atraumatic.   Eyes: Conjunctivae and EOM are normal.   Neck: Normal range of motion. Neck supple.   Cardiovascular: Normal rate, regular rhythm and normal heart sounds.    Pulmonary/Chest: Tachypneic, no wheezing, no increased WOB  Abdominal: Soft. No distension. Nontender  Musculoskeletal: Normal range  of motion. No edema or tenderness.   Neurological: Alert and oriented to person, place, and time.   Skin: Skin is warm and dry. Not diaphoretic.   Psychiatric: Normal mood and affect.   Nursing note and vitals reviewed.  Physical Exam  VITALS: BP- 86/, SaO2- 86%  CHEST: Clear to auscultation bilaterally. No wheezes, rhonchi, or crackles.    Labs:     Labs Reviewed   COMPREHENSIVE METABOLIC PANEL - Abnormal; Notable for the following components:       Result Value    Glucose 141 (*)     Albumin 3.3 (*)     Globulin 3.7 (*)     All other components within normal limits   HIGH SENSITIVITY TROPONIN-I - Abnormal; Notable for the following components:    hs Troponin 147.1 (*)     All other components within normal limits   HIGH SENSITIVITY TROPONIN-I WITH DELTA - Abnormal; Notable for the following components:    hs Troponin 22.9 (*)     All other components within normal limits   D-DIMER - Abnormal; Notable for the following components:    D-Dimer 15.03 (*)     All other components within normal limits   LAB USE ONLY - CBC WITH DIFFERENTIAL - Abnormal; Notable for the following  components:    MCHC 31.0 (*)     All other components within normal limits   NT-PROBNP - Abnormal; Notable for the following components:    NT-ProBNP 450.9 (*)     All other components within normal limits    Narrative:     For patients presenting to the Emergency Department with clinical suspicion of Heart Failure (HF), the results should be interpreted as indicated.                                    Interpretation                Age Group (Years)              pg/mL                  Negative: HF unlikely         All patients >18               <300                                    Grayzone: Indeterminate       18 to <50                      >300.0 to <450.0                  Consider other causes of      50 to 75                       >300.0 to <900.0                  NT-proBNP elevation.          >75                            >300.0 to <1800.0  Positive: HF                  18 to <50                      >450.0                                                50 to 75                       >900.0                                                >75                            >1800.0   COVID-19 (SARS-COV-2) & INFLUENZA  A/B, NAA (ROCHE LIAT) - Normal    Narrative:     A result of Detected indicates POSITIVE for the presence of viral RNA                  A result of Not Detected indicates NEGATIVE for the presence of viral RNA                                    Test performed using the Roche cobas Liat SARS-CoV-2 & Influenza A/B assay. This is a multiplex real-time RT-PCR assay for the detection of SARS-CoV-2, influenza A, and influenza B virus RNA.                                     Influenza A and influenza B negative results should be considered presumptive in samples that have a positive SARS-CoV-2 result.  If positive for SARS-CoV-2 and co-infection with influenza A or influenza B virus is suspected and would change clinical management, please  order influenza specific testing.                                     Viral nucleic acids may persist in vivo, independent of viability. Detection of viral nucleic acid does not imply the presence of infectious virus, or that virus nucleic acid is the cause of clinical symptoms. Negative results do not preclude SARS-CoV-2, influenza A, and/or influenza B infection and should not be used as the sole basis for diagnosis, treatment or other patient management decisions. Invalid results may be due to inhibiting substances in the specimen and recollection should occur.   LACTIC ACID - Normal   APTT - Normal   ANTI-XA,UFH - Normal   CBC AND DIFFERENTIAL    Narrative:     The following orders were created for panel order CBC with Differential (Order).                  Procedure  Abnormality         Status                                     ---------                               -----------         ------                                     CBC with Differential (...[[id number]]  Abnormal            Final result                                                 Please view results for these tests on the individual orders.   URINALYSIS WITH REFLEX TO MICROSCOPIC EXAM - REFLEX TO CULTURE   LAB USE ONLY - URINE GRAY CULTURE HOLD TUBE   ANTI-XA,UFH         Rads:   CT Angio Chest (PE study)  Result Date: 11/10/2024   1.Bilateral acute pulmonary emboli with evidence of high clot burden and right cardiac strain. Recommend interventional radiology consultation to guide further management. 2. No evidence of pulmonary infarction or pleural effusion. 3. The lungs are clear except for minimal left basilar atelectasis. 4. Critical findings were discussed with, and read back by, Dr. Laymon Rocher at the following time 11/10/2024 6:22 AM. Austin Door, MD 11/10/2024 6:31 AM    Chest AP Portable  Result Date: 11/10/2024   No evidence of pneumonia. The lungs are well-expanded and grossly clear. Austin Door, MD 11/10/2024  5:21 AM      Medications   heparin  25,000 units in dextrose  5% 500 mL infusion (premix) (18 Units/kg/hr  88 kg Intravenous Handoff/Dual RN Verify 11/10/24 0909)   heparin  (porcine) injection 7,050 Units (has no administration in time range)   heparin  (porcine) injection 3,500 Units (has no administration in time range)   senna-docusate (PERICOLACE) 8.6-50 MG per tablet 1 tablet (has no administration in time range)   albuterol  (PROVENTIL ) (2.5 MG/3ML) 0.083% nebulizer solution 2.5 mg (2.5 mg Nebulization Given 11/10/24 0431)   ipratropium (ATROVENT ) 0.02 % nebulizer solution 0.5 mg (0.5 mg Nebulization Given 11/10/24 0433)   sodium chloride  0.9 % bolus 1,000 mL (0 mLs Intravenous Stopped 11/10/24 0538)   famotidine  (PEPCID ) injection 20 mg (20 mg Intravenous Given 11/10/24 0437)   fentaNYL  (PF) (SUBLIMAZE ) injection 25 mcg (25 mcg Intravenous Given 11/10/24 0601)   aspirin  chewable tablet 324 mg (324 mg Oral Given 11/10/24 0601)   iohexol  (OMNIPAQUE ) 350 MG/ML injection 70 mL (70 mLs Intravenous Imaging Agent Given 11/10/24 0559)   heparin  (porcine) injection 7,050 Units (7,050 Units Intravenous Given 11/10/24 9383)     Results        MDM and ED Course   C. Floreine Kingdon, M.D., is the primary attending for this patient and has obtained and performed the history, PE, and medical decision making for this patient.    Oxygen saturation by pulse oximetry is 95%-100%, Normal.  Interventions: None Needed    ED Course as of  11/10/24 0913   Mon Nov 10, 2024   0625 Discussed case with PERT team.  Patient blood pressure is good without pressors, she is mildly hypoxic but only requires 2 L nasal cannula, she is a bit tachypneic and tachycardic.  Per the PERT team she does not have significant right heart strain on imaging and they would like to see an echo prior to any further intervention.  Recommend starting heparin . [CV]   9373 Discussed with [facility] who will admit patient. [CV]      ED Course User Index  [CV] Tekesha Almgren, Dorothyann HERO, MD          MDM:    Number and Complexity of Problems Addressed (select at least one)    Complexity: High: 1 acute or chronic illness or injury that poses a threat to life or bodily function      Presenting acute/chronic problems: chest pain, sob    Differential diagnosis to include but not limited to: pe, acs, pneumonia, influenza,    Chronic illness impacting care (obesity, diabetes, hypertension, elderly - state impact): Elderly - being elderly increases risks of certain life threatening diagnoses while simultaneously making those presentations frequently atypical which makes diagnoses a challenge.  History is more complicated and is frequently difficult to ascertain due to memory issues again complicating the workup and diagnosis and necessitating further testing.         ______________________________________________________________________  Amount/complexity of Data Reviewed   {Tip - Level 4 = (1/3 of the following categories); Level 5 = (2/3 categories).   This message will disappear upon signing. :  L\  Look below for information    History obtained from another historian (parent, spouse,  care giver, ems) : n/a    Review of older external records from n/a and found this relevant information: n/a       Independent interpretation of  EKG:     EKG Interpretation  EKG interpreted independently by me:    Rate: Tachycardic  Rhythm: sinus tachycardia  Axis: Normal  ST-T Segments: nonspec st-t changes  Conduction: No blocks  Impression: Non-specific EKG    Significant change when compared from previous EKG on 13 Jun 2024 - now with nonspecific st/t changes.    Independent visualized and interpretation of radiological study by me:     Type of xray performed : cxr  Independent Interpretation by me: no pneumonia    Independent visualized and interpretation of radiological study by me:   Type of xray performed : ct pe  Independent Interpretation by me: +saddle embolism      Diagnostic tests appropriately considered even if  not ultimately performed: n/a    Discussion of test interpretation with external physician/provider :  discussed CT with radiology    Discussion of management with other providers:   Consulted with  PERT team as above, [facility] as above. Patient condition and all pertinent labs and/or radiology studies discussed with physician.     ______________________________________________________________________    Risk of Complications and/or Morbidity or Mortality of Patient Management  (select one if applicable)    Risk: High Risk and Decision regarding hospitalization or escalation of hospital level of care      Patient without hypotension and only mild tachycardia here in the emergency department.  She is a bit tachypneic and mildly hypoxic.  I did consult with the PERT team who would like to see an echo before proceeding with any further intervention.  Started on heparin  and discussed case with the [facility]  who will admit.    Critical Care    Performed by: Gail Dorothyann HERO, MD  Authorized by: Gail Dorothyann HERO, MD    Critical care provider statement:     Critical care time (minutes):  60    Critical care was necessary to treat or prevent imminent or life-threatening deterioration of the following conditions:  Cardiac failure, circulatory failure and respiratory failure    Critical care was time spent personally by me on the following activities:  Discussions with consultants, evaluation of patient's response to treatment, examination of patient, obtaining history from patient or surrogate, ordering and performing treatments and interventions, ordering and review of laboratory studies, ordering and review of radiographic studies, pulse oximetry and re-evaluation of patient's condition    I assumed direction of critical care for this patient from another provider in my specialty: no      Care discussed with: admitting provider          Assessment/Plan:   Results and instructions reviewed at the bedside with patient  and family.  Medical Decision Making  A 74 year old female with a history of hyperlipidemia and hypertension presented with acute onset chest pain described as pressure and burning, associated with shortness of breath and hypoxemia (oxygen saturation 86%). She denied fever, recent illness, cough, leg swelling, history of blood clots, or recent travel. She has no history of heart failure, asthma, or smoking, and her last cardiac evaluation four months ago was reportedly normal. Exam findings included cold extremities and difficulty taking deep breaths, with no abdominal pain or vomiting.    Differential diagnosis includes, but is not limited to:  - Acute Coronary Syndrome: Acute coronary syndrome is considered due to the acute onset of chest pain and shortness of breath in an elderly patient with cardiovascular risk factors, despite a normal cardiac evaluation four months prior.  - Acute Pulmonary Embolism: Acute pulmonary embolism is considered given the acute hypoxemia and chest pain, though absence of risk factors such as recent travel, immobilization, or history of clots makes it less likely.  - Gastroesophageal Reflux Disease (GERD) or Other Gastrointestinal Etiology: Gastrointestinal causes such as reflux are considered due to the burning chest pain and indigestion-like symptoms, though the presence of hypoxemia makes a primary GI etiology less likely.    Acute chest pain and shortness of breath  - Order EKG and chest X-ray  - Monitor oxygen saturation and provide supplemental oxygen as needed  - Order laboratory tests including cardiac enzymes    Clinical Impression  Final diagnoses:   Acute saddle pulmonary embolism, unspecified whether acute cor pulmonale present (CMS/HCC)   Chest pain, unspecified type   Hypoxia   Tachypnea       Disposition  ED Disposition       ED Disposition   Admit    Condition   --    Date/Time   Mon Nov 10, 2024  7:49 AM    Comment   Admitting Physician: COLAN MOSELLE, ANAHITA  [id number]   Service:: Medicine [106]   Estimated Length of Stay: 3 - 5 Days   Tentative Discharge Plan?: Home or Self Care [1]                 Prescriptions  Current Discharge Medication List              Signed by: C. Felecia Stanfill, MD       Marshun Duva, Dorothyann HERO, MD  11/10/24 [phone]       Rebekah Zackery, Dorothyann HERO,  MD  11/10/24 9361         [1]   Past Medical History:  Diagnosis Date    Abnormal vision 10/23/1998    wears glasses    Arthritis     Constipation     HX - HAS BEEN MORE PREVELANT POST BARIATRIC SURGERY.    COVID-19 10/2021    HX - 21//23 URGENT CARE NOTE IN EPIC.    Difficulty walking 05/24/2024    Due to nerve pinched in spine & 2-3-4 lumbar in back spine area    Fibroids     PT HAD A PARTIAL HYSTERECTOMY - OVARIES ARE STILL IN PLACE.    Gastroesophageal reflux disease 10/23/2013    HX.    Hypercholesterolemia 03/23/2010    High Bad Cholesterol Level    Hyperlipidemia 10/23/2016    MANAGED WITH MEDS.    Hypertension 07/17/2024    PT STATES HE BP RUNS FROM 128-130/70s. MANAGED WITH MEDS - FOLLOWED BY PCP.    Left wrist fracture 2018    HX    Low back pain 05/24/2024    Pinched nerve & arthritis    LUQ pain     HX    Pain in joint 05/24/2024    Left knee, thigh and leg    Psoriatic arthritis (CMS/HCC)     HX - LOWER BACK, LEFT HAND, WRIST AND FINGERS    Urinary incontinence 03/23/2017    HX OVERACTIVE BLADDER. ISSUE RESOLVED POST BARIATRIC SURGERY - PT STATED SHE LOST 65 LBS.   [2]   Past Surgical History:  Procedure Laterality Date    BUNIONECTOMY Left     COLONOSCOPY, DIAGNOSTIC (SCREENING)      X 2     COLONOSCOPY, DIAGNOSTIC (SCREENING) N/A 04/13/2021    Procedure: COLONOSCOPY;  Surgeon: Ulysess Foots, MD;  Location: [id number];  Service: Gastroenterology;  Laterality: N/A;    EGD N/A 05/18/2020    Procedure: EGD w/ bx's;  Surgeon: Marcey Sammi BIRCH, MD;  Location: [facility];  Service: Gastroenterology;  Laterality: N/A;    EGD  12/2020    EGD N/A 01/24/2022    Procedure: EGD w/ Bx's;   Surgeon: Marcey Sammi BIRCH, MD;  Location: [facility];  Service: Gastroenterology;  Laterality: N/A;    HYSTERECTOMY  03/23/1997    PARTIAL - OVARIES IN PLACE.    LAPAROSCOPIC GASTRECTOMY SLEEVE WITH HIATAL HERNIA N/A 07/28/2020    Procedure: LAPAROSCOPIC GASTRECTOMY SLEEVE WITH HIATAL HERNIA, OMENTOPEXY, LAPAROSCOPIC RESECTION OF ANTRAL AND FUNDIC GASTRIC MASS;  Surgeon: Eliberto Oris SAUNDERS, DO;  Location: [facility];  Service: General;  Laterality: N/A;   [3]   Family History  Problem Relation Name Age of Onset    Hypertension Mother Tommy Snell         Mother was in her 30s when she had high blood pressure. now deceased    Arthritis Mother Tommy Snell         Had psoreactic arthristic    Obesity Mother Tommy Snell         Mother was obese w.BMI over 38- 40    Diabetes Mother Tommy Snell 49 - 40    Hypertension Father Todd Shed         Father was in his 75s when diagonsis with high blood pressure. now deceased    Arthritis Father Todd Shed         had psoreactic arthristic    Heart disease Father Todd Shed     Heart  failure Father Todd Shed 49 - 40    Hypertension Sister      Hypertension Sister Alfonse Caldron         Had high BPressure in her early 42s now deceased    Hypertension Brother Lynwood Howell in his 58s with HB Pressure    Stroke Brother Lynwood 60 - 69    Hypertension Maternal Grandfather Drue Snell         Not sure of onset age but know he had High BP 60-86 yrs old    Heart disease Paternal Grandmother      Migraines Neg Hx      Seizures Neg Hx     [4]   Social History  Socioeconomic History    Marital status: Divorced   Tobacco Use    Smoking status: Never    Smokeless tobacco: Never    Tobacco comments:     Never smoked or used tobacco   Vaping Use    Vaping status: Never Used   Substance and Sexual Activity    Alcohol use: Yes     Alcohol/week: 0.0 - 1.0 standard drinks of alcohol     Comment: 1-2 DRINKS MONTHLY    Drug use: Never   Other Topics Concern    Dietary  supplements / vitamins Yes    Anesthesia problems No    Blood thinners No    Pregnant No    Future Children No    Number of Pregnancies? Yes     Comment: 1    Number of children Yes     Comment: 1    Miscarriages / Abortions? No    Eats large amounts No    Excessive Sweets Yes    Skips meals No    Eats excessive starches Yes    Snacks or grazes Yes    Emotional eater No    Eats fried food Yes    Eats fast food Yes    Diet Center No    Randall Banks Yes    LA Weight Loss No    Nutri-System Yes    Opti-Fast / Medi-Fast No    Overeaters Anonymous No    [facility] No    TOPS No    Weight Watchers Yes    Atkins No    Binging / Purging No    Calorie Counting Yes    Fasting No    High Protein Yes    Low Carb Yes    Low Fat Yes    [facility] Diet No    Slim Fast No    [employer] Yes    Stationary cycle or treadmill No    Gym/fitness Classes No    Home exercise/video No    Swimming No    Weight training No    Walking or running Yes    Hospitalization No    Hypnosis No    Physical therapy No    Psychological therapy No    Residential program No    Acutrim No    Byetta No    Contrave No    Dexatrim No    Diethylpropion No    Fastin  No    Fen - Phen No    Ionamin / Adipex No    Phentermine  No    Qsymia No    Prozac No    Saxenda No    Topamax  No    Wellbutrin No  Xenical (Orlistat, Alli) No    Other Med No    No impairment No    Walks with cane/crutch No    Requires a wheelchair No    Bedridden No    Are you currently being treated for depression? No    Do you snore? Yes    Are you receiving any medical or psychological services? No    Do you ever wake up at night gasping for breath? Yes    Do you have or have you been treated for an eating disorder? No    Anyone ever told you that you stop breathing while asleep? No    Do you exercise regularly? No    Have you or family member ever have trouble with anesthesia? Yes     Social Drivers of Health     Financial Resource Strain: Medium Risk (08/22/2024)     Overall Financial Resource Strain (CARDIA)     Difficulty of Paying Living Expenses: Somewhat hard   Food Insecurity: Food Insecurity Present (08/22/2024)    Hunger Vital Sign     Worried About Running Out of Food in the Last Year: Sometimes true     Ran Out of Food in the Last Year: Sometimes true   Transportation Needs: No Transportation Needs (08/22/2024)    PRAPARE - Therapist, Art (Medical): No     Lack of Transportation (Non-Medical): No   Physical Activity: Inactive (08/22/2024)    Exercise Vital Sign     Days of Exercise per Week: 0 days     Minutes of Exercise per Session: 0 min   Stress: Stress Concern Present (08/22/2024)    [employer] of Occupational Health - Occupational Stress Questionnaire     Feeling of Stress : Rather much   Social Connections: Moderately Integrated (12/31/2023)    Social Connection and Isolation Panel     Frequency of Communication with Friends and Family: More than three times a week     Frequency of Social Gatherings with Friends and Family: More than three times a week     Attends Religious Services: More than 4 times per year     Active Member of [employer] or Organizations: Yes     Attends Engineer, Structural: More than 4 times per year     Marital Status: Divorced   Intimate Partner Violence: Not At Risk (08/22/2024)    Humiliation, Afraid, Rape, and Kick questionnaire     Fear of Current or Ex-Partner: No     Emotionally Abused: No     Physically Abused: No     Sexually Abused: No   Housing Stability: Not At Risk (08/22/2024)    Housing Stability NCSS     Do you have housing?: Yes     Are you worried about losing your housing?: No   [5] No Known Allergies  [6]   Current Facility-Administered Medications:     heparin  (porcine) injection 3,500 Units, 40 Units/kg, Intravenous, PRN, Baila Rouse, Dorothyann HERO, MD    heparin  (porcine) injection 7,050 Units, 80 Units/kg, Intravenous, PRN, Najir Roop, Dorothyann HERO, MD    heparin  25,000 units in  dextrose  5% 500 mL infusion (premix), 18 Units/kg/hr, Intravenous, Continuous, Theodor Mustin, Dorothyann HERO, MD, Last Rate: 31.7 mL/hr at 11/10/24 0625, 18 Units/kg/hr at 11/10/24 0625    senna-docusate (PERICOLACE) 8.6-50 MG per tablet 1 tablet, 1 tablet, Oral, Q12H SCH, Colan Rowan Ross, MD       Yaphet Smethurst, Dorothyann HERO, MD  11/10/24  0913

## 2024-11-10 NOTE — H&P (Signed)
 CRITICAL CARE  [facility]     [facility]- Critical Care Admission Note      ADMISSION- HISTORY AND PHYSICAL EXAM      Date Time: 11/10/2024 11:21 AM  Patient Name: Barbara Rubio  Attending Physician: Colan Rowan Ross*  Primary Care Physician: Lovett Josette HERO, NP  Location/Room: T698/T698-J   Type of Admission: Inpatient    Chief Complaint / Primary Reason for Critical Care evaluation :      Chief Complaint   Patient presents with    Chest Pain    Shortness of Breath        History of Presenting Illness:   74 year old female with PMH of HTN, HLD, GERD, hiatal hernia, and prior sleeve gastrectomy presents with acute chest pain and shortness of breath. Symptoms began suddenly about 40 minutes prior to arrival. Chest pain is described as tightness and pressure with a burning, indigestion-like sensation.  . Shortness of breath is new and associated with the chest discomfort. She reports a normal cardiac evaluation four months ago. She also notes poor sleep over the past 3-4 nights with nocturnal awakenings to urinate and minimal output. She denies fever, nausea, vomiting, abdominal pain, or leg pain/swelling. No history of asthma, heart failure, blood clots, smoking, or recent travel.     Current medications include Wegovy , lisinopril  10 mg daily, and a statin started 2-3 months ago.    #ER course:   CT: .Bilateral acute pulmonary emboli with evidence of high clot burden and right cardiac strain. Recommend interventional radiology consultation to guide further management.  Troponin 147 down to 22 BNP 450,   Patient desatted and was placed on oxygen 4 to 6 L NC and SO2 improved to 97%.      Assessment:   Problem List: Problem List[1]    Review Problem list    Plan:   NEURO  1. No acute neurologic deficit  - Alert and oriented, no focal neurologic symptoms  - Monitor mental status given hypoxia risk    CARDIAC  . Right ventricular strain with myocardial injury secondary to PE  -  Elevated troponin (147 ? 22) and BNP 450  - CT evidence of right heart strain  - Telemetry monitoring  - Trend troponin/BNP  - Transthoracic echocardiogram    . Chest pain  - Likely secondary to pulmonary embolism  - Recent cardiac workup (4 months ago) reportedly normal  - Continue cardiac monitoring   Hypertension  - On lisinopril  10 mg daily  - Hold for now  . Hyperlipidemia  - On statin therapy  - Continue statin    RESPIRATORY  . Acute bilateral pulmonary embolism with high clot burden  - CT chest confirms bilateral PE with RV strain  - IV anticoagulation (heparin )  - Interventional Radiology consultation done and recommended for heparin  infusion  . Acute hypoxic respiratory failure  - Desaturation requiring 4-6 L NC  - Oxygen therapy to maintain SpO? >92%  - Wean as tolerated    GI  . GERD / hiatal hernia  - Burning chest sensation may be contributory  - Continue acid suppression  - Avoid NSAIDs while anticoagulated  . History of sleeve gastrectomy  - Monitor medication absorption and nutritional status    GU  . Nocturia with minimal urine output  - No dysuria or infection symptoms  - Monitor intake/output and renal function    ID / HEME  .   venous thromboembolism; PE/DVT; in setting of sedentary life, using estradiol ,  Wegovy   -- Anticipate long-term anticoagulation    # MSK  -Sciatica, knee arthritis    -Class I obesity    Endo:   Goal glucose level 140-180.     Integument:    GENERAL CRITICAL CARE ASSESSMENT/ PLANS- IF APPLICABLE    Vascular Access:  GI Prophylaxis:   VTE Prophylaxis:Hep  Nutrition:   Sedation:  Foley Catheter:   AM Labs and Chest xray ordered:   Code Status: full code  Disposition:    Present on Admission:        Past Medical History:   Medical History[2]    Past Surgical History:   Past Surgical History[3]    Family History:   Family History[4]    Social History:   Social History[5]    Allergies:   Allergies[6]    Medications:     Medications Prior to Admission   Medication Sig     acetaminophen  (TYLENOL ) 650 MG CR tablet Take 1 tablet (650 mg) by mouth as needed for Pain    atorvastatin  (LIPITOR) 20 MG tablet Take 1 tablet (20 mg) by mouth once daily    diclofenac  Sodium (Voltaren ) 1 % Gel topical gel Apply topically as needed    estradiol  (ESTRACE ) 0.1 MG/GM vaginal cream Place 0.5 g vaginally twice a week    lisinopril  (ZESTRIL ) 10 MG tablet Take 1 tablet (10 mg) by mouth once daily    Myrbetriq 50 MG Tablet SR 24 hr Take 1 tablet (50 mg) by mouth once daily    Wegovy  0.25 MG/0.5ML injection Inject 0.5 mLs (0.25 mg) into the skin once a week    Inclisiran Sodium (LEQVIO SC) Inject into the skin every 6 (six) months    [DISCONTINUED] carvedilol  (COREG ) 12.5 MG tablet Take 1 tablet (12.5 mg) by mouth 2 (two) times daily with meals Hold for SBP<152mmHg or heart rate less than 55 bpm    [DISCONTINUED] ibuprofen  (ADVIL ) 200 MG tablet Take 1 tablet (200 mg) by mouth as needed for Pain       Current Inpatient :  Current Facility-Administered Medications[7]    Home Medications :   Prescriptions Prior to Admission[8]     Review of Systems:   All 11 systems reviewed all negative except mentioned above    Physical Exam:     Vitals:    11/10/24 1030   BP: 137/87   Pulse: (!) 117   Resp: (!) 26   Temp:    SpO2: 97%       Intake and Output Summary (Last 24 hours) at Date Time  No intake or output data in the 24 hours ending 11/10/24 1121    Physical Exam:    Physical Exam    Physical Exam  Constitutional:       Appearance: Normal appearance.   HENT:      Head: Normocephalic.      Right Ear: Tympanic membrane normal.      Nose: Nose normal.      Mouth/Throat:      Mouth: Mucous membranes are moist.   Cardiovascular:      Rate and Rhythm: Normal rate.      Pulses: Normal pulses.   Pulmonary:      Effort: Pulmonary effort is normal.  Bibasilar crackle and decreasing breathing sound  Abdominal:      General: Abdomen is flat.   Musculoskeletal:         General: Normal range of motion.      Cervical back:  Normal  range of motion.   Skin:     General: Skin is warm.      Capillary Refill: Capillary refill takes less than 2 seconds.   Neurological:      Mental Status: He is alert.              Labs:     Labs (last 72 hours):  Recent Labs     11/10/24  0429   WBC 7.68   Hemoglobin 12.7   Hematocrit 41.0     Recent Labs     11/10/24  0429   PTT 27    Recent Labs     11/10/24  0429   Sodium 139   Potassium 3.5   Chloride 105   CO2 25   BUN 7   Creatinine 0.8   Glucose 141*   Calcium  9.2               Radiology / Imaging:     EKG Results       Procedure Component Value Units Date/Time    ECG 12 lead [id number] Collected: 11/10/24 0415     Updated: 11/10/24 0415     Ventricular Rate 109 BPM      Atrial Rate 109 BPM      P-R Interval 170 ms      QRS Duration 82 ms      Q-T Interval 330 ms      QTC Calculation (Bezet) 444 ms      P Axis 52 degrees      R Axis -19 degrees      T Axis 32 degrees      IHS MUSE NARRATIVE AND IMPRESSION --     SINUS TACHYCARDIA  POSSIBLE LEFT ATRIAL ENLARGEMENT  POSSIBLE ANTERIOR MYOCARDIAL INFARCTION , AGE UNDETERMINED  ABNORMAL ECG  WHEN COMPARED WITH ECG OF 13-Jun-2024 09:57,  NON-SPECIFIC CHANGE IN ST SEGMENT IN INFERIOR LEADS  NONSPECIFIC T WAVE ABNORMALITY NOW EVIDENT IN ANTERIOR LEADS      Narrative:      SINUS TACHYCARDIA  POSSIBLE LEFT ATRIAL ENLARGEMENT  POSSIBLE ANTERIOR MYOCARDIAL INFARCTION , AGE UNDETERMINED  ABNORMAL ECG  WHEN COMPARED WITH ECG OF 13-Jun-2024 09:57,  NON-SPECIFIC CHANGE IN ST SEGMENT IN INFERIOR LEADS  NONSPECIFIC T WAVE ABNORMALITY NOW EVIDENT IN ANTERIOR LEADS            Other ICU Data   Vent Settings:         Invasive ICU Hemodynamics:             Line, Drains,Airway :                        Critical care provider statement:     Critical care time (minutes):  60    I have personally reviewed the patients history and 24 hour interval events, along with vitals, labs, radiology images and nursing.     Critical care time was exclusive of separately billable procedures  and treating other patients and teaching time.   Critical care was time spent personally by me on one or more of the following activities: discussions with consultants, development of treatment plan with patient or surrogate, evaluation of patient's response to treatment, examination of patient, obtaining history from patient or surrogate, ordering and performing treatments and interventions, ordering and review of laboratory studies, ordering and review of radiographic studies, pulse oximetry, re-evaluation of patient's condition and review of old charts.   At least one organ system is  acutely impaired.   There is a high probability of imminent, life-threatening deterioration.   I intervened to try to prevent further deterioration of the patient's condition.            Signed by:  Dorn Hartshorne Colan Moselle, MD                       [facility]                   MD x [phone]   NP x [phone]            11/10/2024 11:21 AM          Attending Attestation:     I have personally seen and examined the patient and performed the substantive portion of the visit with my medical decision making. I have reviewed medical records, vitals, labs, imaging, and consultation notes within epic and care everywhere. I have independently reviewed today's events and data used in developing today's plan of care. I agree with the documentation, assessment and plan of Derrika Marah Park as recorded by Hattie Colan Moselle, MD  with additions and exceptions as noted:        Colan Moselle, Sariah Henkin*         rr:[id number], Josette HERO, NP          [1]   Patient Active Problem List  Diagnosis    Primary hypertension    Mixed hyperlipidemia    Gastroesophageal reflux disease    Hiatal hernia    Vitamin D  deficiency    Severe obesity (CMS/HCC)    S/P laparoscopic sleeve gastrectomy    Obesity (BMI 30-39.9)    LUQ abdominal pain    Chronic constipation    Other low back pain    Left leg pain    Radiculopathy, lumbar region    ASCVD  (arteriosclerotic cardiovascular disease)    Acute saddle pulmonary embolism, unspecified whether acute cor pulmonale present (CMS/HCC)   [2]   Past Medical History:  Diagnosis Date    Abnormal vision 10/23/1998    wears glasses    Arthritis     Constipation     HX - HAS BEEN MORE PREVELANT POST BARIATRIC SURGERY.    COVID-19 10/2021    HX - 21//23 URGENT CARE NOTE IN EPIC.    Difficulty walking 05/24/2024    Due to nerve pinched in spine & 2-3-4 lumbar in back spine area    Fibroids     PT HAD A PARTIAL HYSTERECTOMY - OVARIES ARE STILL IN PLACE.    Gastroesophageal reflux disease 10/23/2013    HX.    Hypercholesterolemia 03/23/2010    High Bad Cholesterol Level    Hyperlipidemia 10/23/2016    MANAGED WITH MEDS.    Hypertension 07/17/2024    PT STATES HE BP RUNS FROM 128-130/70s. MANAGED WITH MEDS - FOLLOWED BY PCP.    Left wrist fracture 2018    HX    Low back pain 05/24/2024    Pinched nerve & arthritis    LUQ pain     HX    Pain in joint 05/24/2024    Left knee, thigh and leg    Psoriatic arthritis (CMS/HCC)     HX - LOWER BACK, LEFT HAND, WRIST AND FINGERS    Urinary incontinence 03/23/2017    HX OVERACTIVE BLADDER. ISSUE RESOLVED POST BARIATRIC SURGERY - PT STATED SHE LOST 65 LBS.   [3]   Past Surgical History:  Procedure Laterality Date    BUNIONECTOMY Left     COLONOSCOPY, DIAGNOSTIC (SCREENING)      X 2     COLONOSCOPY, DIAGNOSTIC (SCREENING) N/A 04/13/2021    Procedure: COLONOSCOPY;  Surgeon: Ulysess Foots, MD;  Location: [id number];  Service: Gastroenterology;  Laterality: N/A;    EGD N/A 05/18/2020    Procedure: EGD w/ bx's;  Surgeon: Marcey Sammi BIRCH, MD;  Location: [facility];  Service: Gastroenterology;  Laterality: N/A;    EGD  12/2020    EGD N/A 01/24/2022    Procedure: EGD w/ Bx's;  Surgeon: Marcey Sammi BIRCH, MD;  Location: [facility];  Service: Gastroenterology;  Laterality: N/A;    HYSTERECTOMY  03/23/1997    PARTIAL - OVARIES IN PLACE.    LAPAROSCOPIC GASTRECTOMY SLEEVE  WITH HIATAL HERNIA N/A 07/28/2020    Procedure: LAPAROSCOPIC GASTRECTOMY SLEEVE WITH HIATAL HERNIA, OMENTOPEXY, LAPAROSCOPIC RESECTION OF ANTRAL AND FUNDIC GASTRIC MASS;  Surgeon: Eliberto Oris SAUNDERS, DO;  Location: [facility];  Service: General;  Laterality: N/A;   [4]   Family History  Problem Relation Name Age of Onset    Hypertension Mother Tommy Snell         Mother was in her 30s when she had high blood pressure. now deceased    Arthritis Mother Tommy Snell         Had psoreactic arthristic    Obesity Mother Tommy Snell         Mother was obese w.BMI over 38- 40    Diabetes Mother Tommy Snell 49 - 40    Hypertension Father Todd Shed         Father was in his 58s when diagonsis with high blood pressure. now deceased    Arthritis Father Todd Shed         had psoreactic arthristic    Heart disease Father Todd Shed     Heart failure Father Todd Shed 49 - 40    Hypertension Sister      Hypertension Sister Alfonse Caldron         Had high BPressure in her early 65s now deceased    Hypertension Brother Lynwood Howell in his 81s with HB Pressure    Stroke Brother Lynwood 60 - 69    Hypertension Maternal Grandfather Drue Snell         Not sure of onset age but know he had High BP 60-86 yrs old    Heart disease Paternal Grandmother      Migraines Neg Hx      Seizures Neg Hx     [5]   Social History  Socioeconomic History    Marital status: Divorced   Tobacco Use    Smoking status: Never    Smokeless tobacco: Never    Tobacco comments:     Never smoked or used tobacco   Vaping Use    Vaping status: Never Used   Substance and Sexual Activity    Alcohol use: Yes     Alcohol/week: 0.0 - 1.0 standard drinks of alcohol     Comment: 1-2 DRINKS MONTHLY    Drug use: Never   Other Topics Concern    Dietary supplements / vitamins Yes    Anesthesia problems No    Blood thinners No    Pregnant No    Future Children No    Number of Pregnancies? Yes     Comment: 1  Number of children Yes     Comment: 1     Miscarriages / Abortions? No    Eats large amounts No    Excessive Sweets Yes    Skips meals No    Eats excessive starches Yes    Snacks or grazes Yes    Emotional eater No    Eats fried food Yes    Eats fast food Yes    Diet Center No    Randall Banks Yes    LA Weight Loss No    Nutri-System Yes    Opti-Fast / Medi-Fast No    Overeaters Anonymous No    [facility] No    TOPS No    Weight Watchers Yes    Atkins No    Binging / Purging No    Calorie Counting Yes    Fasting No    High Protein Yes    Low Carb Yes    Low Fat Yes    [facility] Diet No    Slim Fast No    [place] Yes    Stationary cycle or treadmill No    Gym/fitness Classes No    Home exercise/video No    Swimming No    Weight training No    Walking or running Yes    Hospitalization No    Hypnosis No    Physical therapy No    Psychological therapy No    Residential program No    Acutrim No    [place] No    Contrave No    Dexatrim No    Diethylpropion No    Fastin  No    Fen - Phen No    Ionamin / Adipex No    Phentermine  No    Qsymia No    Prozac No    Saxenda No    Topamax  No    Wellbutrin No    Xenical (Orlistat, Alli) No    Other Med No    No impairment No    Walks with cane/crutch No    Requires a wheelchair No    Bedridden No    Are you currently being treated for depression? No    Do you snore? Yes    Are you receiving any medical or psychological services? No    Do you ever wake up at night gasping for breath? Yes    Do you have or have you been treated for an eating disorder? No    Anyone ever told you that you stop breathing while asleep? No    Do you exercise regularly? No    Have you or family member ever have trouble with anesthesia? Yes     Social Drivers of Health     Financial Resource Strain: Medium Risk (08/22/2024)    Overall Financial Resource Strain (CARDIA)     Difficulty of Paying Living Expenses: Somewhat hard   Food Insecurity: No Food Insecurity (11/10/2024)    Hunger Vital Sign     Worried About Running Out of  Food in the Last Year: Never true     Ran Out of Food in the Last Year: Never true   Recent Concern: Food Insecurity - Food Insecurity Present (08/22/2024)    Hunger Vital Sign     Worried About Running Out of Food in the Last Year: Sometimes true     Ran Out of Food in the Last Year: Sometimes true   Transportation Needs: No Transportation Needs (11/10/2024)  PRAPARE - Therapist, Art (Medical): No     Lack of Transportation (Non-Medical): No   Physical Activity: Inactive (08/22/2024)    Exercise Vital Sign     Days of Exercise per Week: 0 days     Minutes of Exercise per Session: 0 min   Stress: Stress Concern Present (08/22/2024)    [employer] of Occupational Health - Occupational Stress Questionnaire     Feeling of Stress : Rather much   Social Connections: Moderately Integrated (12/31/2023)    Social Connection and Isolation Panel     Frequency of Communication with Friends and Family: More than three times a week     Frequency of Social Gatherings with Friends and Family: More than three times a week     Attends Religious Services: More than 4 times per year     Active Member of [employer] or Organizations: Yes     Attends Engineer, Structural: More than 4 times per year     Marital Status: Divorced   Intimate Partner Violence: Not At Risk (11/10/2024)    Humiliation, Afraid, Rape, and Kick questionnaire     Fear of Current or Ex-Partner: No     Emotionally Abused: No     Physically Abused: No     Sexually Abused: No   Housing Stability: Not At Risk (11/10/2024)    Housing Stability NCSS     Do you have housing?: Yes     Are you worried about losing your housing?: No   [6] No Known Allergies  [7]   Current Facility-Administered Medications   Medication Dose Route Frequency    atorvastatin   20 mg Oral Daily    senna-docusate  1 tablet Oral Q12H SCH   [8]   Medications Prior to Admission   Medication Sig Dispense Refill Last Dose/Taking    acetaminophen  (TYLENOL ) 650 MG CR  tablet Take 1 tablet (650 mg) by mouth as needed for Pain   Past Week    atorvastatin  (LIPITOR) 20 MG tablet Take 1 tablet (20 mg) by mouth once daily 90 tablet 3 11/09/2024    diclofenac  Sodium (Voltaren ) 1 % Gel topical gel Apply topically as needed   Past Week    estradiol  (ESTRACE ) 0.1 MG/GM vaginal cream Place 0.5 g vaginally twice a week   Past Month    lisinopril  (ZESTRIL ) 10 MG tablet Take 1 tablet (10 mg) by mouth once daily 90 tablet 3 11/09/2024    Myrbetriq 50 MG Tablet SR 24 hr Take 1 tablet (50 mg) by mouth once daily   11/09/2024    Wegovy  0.25 MG/0.5ML injection Inject 0.5 mLs (0.25 mg) into the skin once a week 4 mL 1 11/04/2024    Inclisiran Sodium (LEQVIO SC) Inject into the skin every 6 (six) months   07/01/2024    [DISCONTINUED] carvedilol  (COREG ) 12.5 MG tablet Take 1 tablet (12.5 mg) by mouth 2 (two) times daily with meals Hold for SBP<176mmHg or heart rate less than 55 bpm 180 tablet 3     [DISCONTINUED] ibuprofen  (ADVIL ) 200 MG tablet Take 1 tablet (200 mg) by mouth as needed for Pain

## 2024-11-10 NOTE — Pharmacy Admission Med History Advanced (Signed)
 Admission Medication History    Prior to Admission Medication List:  Home Medications       Med List Status: Pharmacy In Progress Set By: Harvey, Jamila at 11/10/2024  8:28 AM              acetaminophen  (TYLENOL ) 650 MG CR tablet     Take 1 tablet (650 mg) by mouth as needed for Pain     atorvastatin  (LIPITOR) 20 MG tablet     Take 1 tablet (20 mg) by mouth once daily     diclofenac  Sodium (Voltaren ) 1 % Gel topical gel     Apply topically as needed     estradiol  (ESTRACE ) 0.1 MG/GM vaginal cream     Place 0.5 g vaginally twice a week     Inclisiran Sodium (LEQVIO SC)     Inject into the skin every 6 (six) months     lisinopril  (ZESTRIL ) 10 MG tablet     Take 1 tablet (10 mg) by mouth once daily     Myrbetriq 50 MG Tablet SR 24 hr     Take 1 tablet (50 mg) by mouth once daily     Wegovy  0.25 MG/0.5ML injection     Inject 0.5 mLs (0.25 mg) into the skin once a week                    Summary:    Medication reconciliation by provider has occurred already.     The following medication changes were documented in the prior to admission medication list:  The following medications were changed (reason if applicable):  N/A  The following medications were removed/marked as not taking (reason if applicable):  Carvedilol , ibuprofen   The following medications were added:  N/A    Home Medication Screening:  Did the patient bring any medications into the hospital with them? No    Additional Comments: Patient states that she was told by her provider to stop carvedilol  and start lisinopril .     Patient demonstrates Good compliance with medications.  Patient demonstrates Good knowledge of medications.     Primary Source of Medication History: Self, Family Member, and Pharmacy    Patient's Pharmacy Information:  Primary Pharmacy Updated in Epic: Yes  Preferred pharmacy:   [id number] - [place], [place] - [address]  [address]  [place] [address]  Phone: [phone] Fax: [phone]    [id number] - [place], [place] -  [address]  [phone]  [place] [address]  Phone: [phone] Fax: [phone]      Note prepared by: [facility]    This Medication history was completed by a pharmacy technician and reviewed by a pharmacist.  Medication histories completed by pharmacy have been conducted to the best of our abilities utilizing multiple sources of information but errors and omissions may exist. For questions or clarifications about this medication history please reach out to pharmacy.

## 2024-11-10 NOTE — ED Triage Notes (Signed)
 Walked in to ED c/o sudden onset chest tightness and SOB that woke her up from sleep around 0300 AM this morning. Patient was 86% on RA upon arrival.    Hx. HTN, HLD

## 2024-11-10 NOTE — Consults (Addendum)
 VIR CONSULT NOTE    Date Time: 11/10/2024 1:42 PM  Patient Name: Barbara Rubio, Barbara Rubio      Assessment:   Intermediate high risk PE on 6L NC. CTA showing saddle PE. LE duplex showing RLE DVT. Overall SOB and chest pain improved since admission and initiation of hep drip.    Plan:   No indication for emergent PE thrombectomy at this time. Will see how she does with anticoagulation alone for today and reassess tomorrow AM. Keep NPO. Discussed thrombectomy procedure details, risks and benefits with pt and daughter at bedside. Questions and concerns addressed. Pt aware that she may require thrombectomy procedure to return to baseline activity.    HPI:   Hx of hip and knee OA and sciatica. She is seeing orthopedics and planning surgical treatment if and/or when she fails steroid shots/conservative management. She has been more sedentary reportedly due to joint pain. Otherwise, she completes all activities of daily living herself. Ambulates with a cane. She presents to the ED with SOB and chest pain. Since admission, hep drip initiated and her chest pain and shortness of breath has significantly improved. She can talk in full sentences and her chest no longer bothers her.    ROS:   A 10 point ROS was performed and is negative aside from HPI.     Medications:   Current Facility-Administered Medications[1]      Physical Exam:     Vitals:    11/10/24 1230   BP: 130/76   Pulse: (!) 107   Resp: 19   Temp:    SpO2: 96%       General: NAD  CV: mildly tachy  RESP: NLBRA, on 6L NC.  GI ND  Extremity: No LE swelling. Motor and sensation intact.    I & O     Intake and Output Summary (Last 24 hours) at Date Time  No intake or output data in the 24 hours ending 11/10/24 1342      Labs:     Lab Results   Component Value Date/Time    WBC 7.68 11/10/2024 04:29 AM    WBC 4.84 01/17/2022 09:10 AM    HCT 41.0 11/10/2024 04:29 AM    HCT 37.6 01/17/2022 09:10 AM    INR 1.0 06/09/2020 11:26 AM    PT 11.5 06/09/2020 11:26 AM    PTT 27  11/10/2024 04:29 AM    BUN 7 11/10/2024 04:29 AM    BUN 12.0 02/05/2023 08:07 AM    CREAT 0.8 11/10/2024 04:29 AM    CREAT 0.8 02/05/2023 08:07 AM    GLU 141 (H) 11/10/2024 04:29 AM    GLU 86 02/05/2023 08:07 AM    K 3.5 11/10/2024 04:29 AM    K 4.5 02/05/2023 08:07 AM        Rads:   Echo Adult TTE Complete  Result Date: 11/10/2024  Date: 11/10/2024  Left ventricular systolic function is normal with an ejection fraction by Biplane Method of Discs of  68 %. Left ventricular segmental wall motion is normal. Left ventricular diastolic function could not be accurately assessed due to tachycardia. Mildly decreased right ventricular systolic function. There is mild tricuspid regurgitation. 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 Moderate pulmonary hypertension with estimated right ventricular systolic pressure of  46 mmHg. Compared to the prior study, estimated pulmonary pressures are elevated with decreased RV systolic function and dilated IVC. Findings reported to primary team, Marjan  Darab, PA at 12:08 pm on 11/11/23.      US  Venous Low Extrem Duplx Dopp Comp Bilat  Result Date: 11/10/2024  1.Occlusive thrombosis of the right distal femoral vein, popliteal vein, posterior tibial veins and one of the peroneal veins. 2.No sonographic evidence for left lower extremity deep venous thrombosis. Artie Russella Estrin, MD 11/10/2024 10:34 AM    CT Angio Chest (PE study)  Result Date: 11/10/2024   1.Bilateral acute pulmonary emboli with evidence of high clot burden and right cardiac strain. Recommend interventional radiology consultation to guide further management. 2. No evidence of pulmonary infarction or pleural effusion. 3. The lungs are clear except for minimal left basilar atelectasis. 4. Critical findings were discussed with, and read back by, Dr. Laymon Rocher at the following time 11/10/2024 6:22 AM. Austin Door, MD 11/10/2024 6:31 AM    Chest AP  Portable  Result Date: 11/10/2024   No evidence of pneumonia. The lungs are well-expanded and grossly clear. Austin Door, MD 11/10/2024 5:21 AM           Signed by: Jodie Pillar, MD    [facility]  [facility], Division of Vascular & Interventional Radiology  [phone]  [place] - [phone]  IFH--3570 ILH--8022 IFOH--3069          [1]   Current Facility-Administered Medications   Medication Dose Route Frequency    atorvastatin   20 mg Oral Daily    senna-docusate  1 tablet Oral Q12H [facility]

## 2024-11-10 NOTE — Progress Notes (Signed)
 Received pt from the ER. Pt on monitor and on heparin  drip. Pt moved to [facility] bed with air taps. After moving with the turning in bed for bath and talking to answer admission questions O2 stat dropped and O2 increased to 6L. Pt oriented to room. Daughter at bedside.       4 eyes in 4 hours pressure injury assessment note:      Completed with: Jen  Unit & Time admitted: 11/10/24 @0910              Bony Prominences: Check appropriate box; if wound is present enter wound assessment in LDA     Occiput:                 [x] WNL  []  Wound present  Face:                     [x] WNL  []  Wound present  Ears:                      [x] WNL  []  Wound present  Spine:                    [x] WNL  []  Wound present  Shoulders:             [x] WNL  []  Wound present  Elbows:                  [x] WNL  []  Wound present  Sacrum/coccyx:     [x] WNL  []  Wound present  Ischial Tuberosity:  [x] WNL  []  Wound present  Trochanter/Hip:      [x] WNL  []  Wound present  Knees:                   [x] WNL  []  Wound present  Ankles:                   [x] WNL  []  Wound present  Heels:                    [x] WNL  []  Wound present  Other pressure areas:  []  Wound location       Device related: []  Device name:         LDA completed if wound present: yes/no  Consult WOCN if necessary    Other skin related issues, ie tears, rash, etc, document in Integumentary flowsheet      Baseline scar on back that pt states is from a heading pad.

## 2024-11-10 NOTE — PERT Note (Signed)
 PE Response Team  Multi-Disciplinary Plan of Care         Referring Service: ER  Intensivist: Dr. Humberto   Pulmonologist: Dr. Merrilee  Interventional Radiologist: Dr. Edda    Barbara Rubio is a 74 y.o. female with PMH significant for HTN, HLD, GERD, hiatal hernia and h/o laparoscopic sleeve gastrectomy who presents with shortness of breath.    Temp:  [97.6 F (36.4 C)] 97.6 F (36.4 C)  Heart Rate:  [111-117] 114  Resp Rate:  [20-31] 27  BP: (136-162)/(80-97) 141/87      Assessment:     Simplified PESI Score:  Used for PE Risk Stratification  Low Risk = total score of 0  High Risk = total score of 1 or greater  Age:   [Points: 0] Age less than or equal to 80 years  Cancer History: [Points: 0] No history of cancer  Chronic Disease: [Points: 1] History of chronic cardiopulmonary disease  Heart Rate:  [Points: 1] HR greater than or equal to 110  Systolic BP:  [Points: 0] Systolic BP greater than or equal to 100  Oxygen Saturation: [Points: 1] O2 saturation less than 90%  Total Score: 3    Simplified PESI Risk Category: [Total Score: 1 or more Points] High Risk -- 8.9% mortality (30 days)      VTE-BLEED Score:  Used for Hemorrhage Risk Stratification in VTE Treatment  Low Risk = total score of less than 2  High Risk = total score of 2 or greater  Active Cancer: [Points: 0] No active cancer  Female with HTN: [Points: 0] Not a female with uncontrolled hypertension (baseline SBP less than 140 mmHg)  Anemia:  [Points: 0] No anemia (defined as Hgb <13 g/dL for men, Hgb <87 g/dL for women)  Bleeding History: [Points: 0] No history of clinically significant bleeding  Renal Dysfunction: [Points: 0] CrCl greater than 60 ml/min  Age:   [Points: 1.5] Age greater than or equal to 60 years  Total Score: 1.5    VTE-BLEED Risk Category: [Total Score: less than 2 Points] Low Bleeding Risk      Severe RV Dysfunction Present: Echo pending  CT or Echo Evidence of RV Strain (at least one criteria):  RV/LV Ratio > 1.1  TAPSE < 1  RV FAC <  22%  RVEF < 32%  RVSP > 70 mmHg      PE Risk Category:  Low (Hemodynamically stable, no evidence of RV dysfunction)      Plan of Action:     Level of Care Recommended:  [x]  Medical Telemetry  []  Cardiac Telemetry  []  Intermediate Care Unit  []  Intensive Care Unit    Monitoring Recommended:  [x]  Telemetry  []  Continuous Pulse Oximetry  []  Troponin, BNP, and lactic acid Q8H for 24 hours  []  Echocardiogram STAT  [x]  Echocardiogram ASAP -- Request 1st in AM for PERT echocardiogram  []  Echocardiogram repeated 48 hours after initial echocardiogram    Treatment Recommended:  [x]  Systemic Anticoagulation  []  Catheter Directed Thrombectomy  []  Systemic Thrombolysis  []  Inferior Vena Cava Filter Placement  []  CT Surgery Consult for Possible Embolectomy  []  ECMO Team Consult    Additional Information / Discussion:  Systemic a/c, TTE and monitor    Please contact PERT team immediately with any change in patient's clinical status.      Imaging:     CT Angio Chest (PE study)  Result Date: 11/10/2024   1.Bilateral acute pulmonary emboli with evidence of high  clot burden and right cardiac strain. Recommend interventional radiology consultation to guide further management. 2. No evidence of pulmonary infarction or pleural effusion. 3. The lungs are clear except for minimal left basilar atelectasis. 4. Critical findings were discussed with, and read back by, Dr. Laymon Rocher at the following time 11/10/2024 6:22 AM. Austin Door, MD 11/10/2024 6:31 AM    Chest AP Portable  Result Date: 11/10/2024   No evidence of pneumonia. The lungs are well-expanded and grossly clear. Austin Door, MD 11/10/2024 5:21 AM      TTE:   Cardiology Results       Procedure Component Value Units Date/Time    ECG 12 lead [[id number]] Collected: 11/10/24 0415     Updated: 11/10/24 0415     Ventricular Rate 109 BPM      Atrial Rate 109 BPM      P-R Interval 170 ms      QRS Duration 82 ms      Q-T Interval 330 ms      QTC Calculation (Bezet) 444 ms      P Axis 52 degrees       R Axis -19 degrees      T Axis 32 degrees      IHS MUSE NARRATIVE AND IMPRESSION --     SINUS TACHYCARDIA  POSSIBLE LEFT ATRIAL ENLARGEMENT  POSSIBLE ANTERIOR MYOCARDIAL INFARCTION , AGE UNDETERMINED  ABNORMAL ECG  WHEN COMPARED WITH ECG OF 13-Jun-2024 09:57,  NON-SPECIFIC CHANGE IN ST SEGMENT IN INFERIOR LEADS  NONSPECIFIC T WAVE ABNORMALITY NOW EVIDENT IN ANTERIOR LEADS      Narrative:      SINUS TACHYCARDIA  POSSIBLE LEFT ATRIAL ENLARGEMENT  POSSIBLE ANTERIOR MYOCARDIAL INFARCTION , AGE UNDETERMINED  ABNORMAL ECG  WHEN COMPARED WITH ECG OF 13-Jun-2024 09:57,  NON-SPECIFIC CHANGE IN ST SEGMENT IN INFERIOR LEADS  NONSPECIFIC T WAVE ABNORMALITY NOW EVIDENT IN ANTERIOR LEADS              Laboratory Data:     Troponin:   Lab Results   Component Value Date    TROPI 22.9 (H) 11/10/2024    TROPI 147.1 (HH) 11/10/2024     NT-ProBNP 450.9 High        Signature:     Time on encounter (including reviewing chart, reviewing images, reviewing labs, discussing case w/ outside hospital provider, documentation): 6 minutes     Marlys Calk, MD    [facility] Pulmonology  Spectra  [phone]

## 2024-11-10 NOTE — ED to IP RN Note (Signed)
 [facility] [place]  ED NURSING NOTE FOR THE RECEIVING INPATIENT NURSE   ED NURSE Dodj/Ry   [place] [phone]   ED CHARGE RN Ethan   ADMISSION INFORMATION   Kahmari Shawntell Dixson is a 74 y.o. female admitted with an ED diagnosis of:    1. Acute saddle pulmonary embolism, unspecified whether acute cor pulmonale present (CMS/HCC)    2. Chest pain, unspecified type    3. Hypoxia    4. Tachypnea         Isolation: None   Allergies: Patient has no known allergies.   Holding Orders confirmed? No   Belongings Documented? No   Home medications sent to pharmacy confirmed? N/A   NURSING CARE   Patient Comes From:   Mental Status: Home Independent  alert and oriented   ADL: Independent with all ADLs   Ambulation: ambulates with: cane and walker   Pertinent Information  and Safety Concerns:     Broset Violence Risk Level: Low please call RN and On Heparin  Drip     CT / NIH   CT Head ordered on this patient?  No   NIH/Dysphagia assessment done prior to admission? N/A   VITAL SIGNS (at the time of this note)      Vitals:    11/10/24 0632   BP: (!) 161/92   Pulse: (!) 113   Resp: 22   Temp:    SpO2: 93%     Pain Score: 10-severe pain (11/10/24 0406)

## 2024-11-10 NOTE — Plan of Care (Signed)
 Problem: Hemodynamic Status: Cardiac  Goal: Stable vital signs and fluid balance  Flowsheets (Taken 11/10/2024 1547 by Elnor Crank, RN)  Stable vital signs and fluid balance:   Assess signs and symptoms associated with cardiac rhythm changes   Monitor lab values     Problem: Inadequate Tissue Perfusion  Goal: Adequate tissue perfusion will be maintained  Flowsheets (Taken 11/10/2024 1547 by Elnor Crank, RN)  Adequate tissue perfusion will be maintained:   Monitor/assess lab values and report abnormal values   Monitor/assess neurovascular status (pulses, capillary refill, pain, paresthesia, paralysis, presence of edema)   Monitor/assess for signs of VTE (edema of calf/thigh redness, pain)   Monitor for signs and symptoms of a pulmonary embolism (dyspnea, tachypnea, tachycardia, confusion)   Encourage/assist patient as needed to turn, cough, and perform deep breathing every 2 hours     Problem: Ineffective Gas Exchange  Goal: Effective breathing pattern  Flowsheets (Taken 11/10/2024 1547 by Elnor Crank, RN)  Effective breathing pattern: Teach/reinforce use of ordered respiratory interventions (ie. CPAP, BiPAP, Incentive Spirometer, Acapella)     Problem: Inadequate Gas Exchange  Goal: Adequate oxygenation and improved ventilation  Flowsheets (Taken 11/10/2024 1547 by Elnor Crank, RN)  Adequate oxygenation and improved ventilation:   Assess lung sounds   Monitor SpO2 and treat as needed   Monitor and treat ETCO2   Position for maximum ventilatory efficiency   Provide mechanical and oxygen support to facilitate gas exchange   Teach/reinforce use of incentive spirometer 10 times per hour while awake, cough and deep breath as needed   Plan activities to conserve energy: plan rest periods   Increase activity as tolerated/progressive mobility   Consult/collaborate with Respiratory Therapy     Problem: Moderate/High Fall Risk Score >5  Goal: Patient will remain free of falls  Flowsheets (Taken 11/10/2024  2113)  High (Greater than 13):   HIGH-Consider use of low bed   HIGH-Utilize chair pad alarm for patient while in the chair   HIGH-Visual cue at entrance to patient's room   HIGH-Initiate use of floor mats as appropriate   HIGH-Apply yellow Fall Risk arm band   HIGH-Bed alarm on at all times while patient in bed

## 2024-11-10 NOTE — Plan of Care (Signed)
 Problem: Hemodynamic Status: Cardiac  Goal: Stable vital signs and fluid balance  Outcome: Progressing  Flowsheets (Taken 11/10/2024 1547)  Stable vital signs and fluid balance:   Assess signs and symptoms associated with cardiac rhythm changes   Monitor lab values     Problem: Inadequate Tissue Perfusion  Goal: Adequate tissue perfusion will be maintained  Outcome: Progressing  Flowsheets (Taken 11/10/2024 1547)  Adequate tissue perfusion will be maintained:   Monitor/assess lab values and report abnormal values   Monitor/assess neurovascular status (pulses, capillary refill, pain, paresthesia, paralysis, presence of edema)   Monitor/assess for signs of VTE (edema of calf/thigh redness, pain)   Monitor for signs and symptoms of a pulmonary embolism (dyspnea, tachypnea, tachycardia, confusion)   Encourage/assist patient as needed to turn, cough, and perform deep breathing every 2 hours     Problem: Ineffective Gas Exchange  Goal: Effective breathing pattern  Outcome: Progressing  Flowsheets (Taken 11/10/2024 1547)  Effective breathing pattern: Teach/reinforce use of ordered respiratory interventions (ie. CPAP, BiPAP, Incentive Spirometer, Acapella)     Problem: Inadequate Gas Exchange  Goal: Adequate oxygenation and improved ventilation  Outcome: Progressing  Flowsheets (Taken 11/10/2024 1547)  Adequate oxygenation and improved ventilation:   Assess lung sounds   Monitor SpO2 and treat as needed   Monitor and treat ETCO2   Position for maximum ventilatory efficiency   Provide mechanical and oxygen support to facilitate gas exchange   Teach/reinforce use of incentive spirometer 10 times per hour while awake, cough and deep breath as needed   Plan activities to conserve energy: plan rest periods   Increase activity as tolerated/progressive mobility   Consult/collaborate with Respiratory Therapy     Problem: Moderate/High Fall Risk Score >5  Goal: Patient will remain free of falls  Outcome: Progressing  Flowsheets (Taken  11/10/2024 1000)  High (Greater than 13):   HIGH-Visual cue at entrance to patient's room   HIGH-Bed alarm on at all times while patient in bed   HIGH-Utilize chair pad alarm for patient while in the chair   HIGH-Apply yellow Fall Risk arm band   HIGH-Pharmacy to initiate evaluation and intervention per protocol   HIGH-Initiate use of floor mats as appropriate   HIGH-Consider use of low bed

## 2024-11-11 ENCOUNTER — Telehealth (INDEPENDENT_AMBULATORY_CARE_PROVIDER_SITE_OTHER): Payer: Self-pay

## 2024-11-11 ENCOUNTER — Inpatient Hospital Stay: Admitting: Rehabilitative and Restorative Service Providers"

## 2024-11-11 LAB — CBC
Absolute nRBC: 0 x10 3/uL (ref <=0.00)
Hematocrit: 35.4 % (ref 34.7–43.7)
Hemoglobin: 11.3 g/dL — ABNORMAL LOW (ref 11.4–14.8)
MCH: 27.6 pg (ref 25.1–33.5)
MCHC: 31.9 g/dL (ref 31.5–35.8)
MCV: 86.6 fL (ref 78.0–96.0)
MPV: 12 fL (ref 8.9–12.5)
Platelet Count: 145 x10 3/uL (ref 142–346)
RBC: 4.09 x10 6/uL (ref 3.90–5.10)
RDW: 15 % (ref 11–15)
WBC: 7.31 x10 3/uL (ref 3.10–9.50)
nRBC %: 0 /100{WBCs} (ref <=0.0)

## 2024-11-11 LAB — URINALYSIS WITH REFLEX TO MICROSCOPIC EXAM - REFLEX TO CULTURE
Urine Bilirubin: NEGATIVE
Urine Glucose: NEGATIVE
Urine Ketones: NEGATIVE mg/dL
Urine Leukocyte Esterase: NEGATIVE
Urine Nitrite: NEGATIVE
Urine Specific Gravity: 1.029 (ref 1.001–1.035)
Urine Urobilinogen: NORMAL mg/dL (ref 0.2–2.0)
Urine pH: 7.5 (ref 5.0–8.0)

## 2024-11-11 LAB — BASIC METABOLIC PANEL
Anion Gap: 9 (ref 5.0–15.0)
BUN: 7 mg/dL (ref 7–21)
CO2: 22 meq/L (ref 17–29)
Calcium: 8.5 mg/dL (ref 7.9–10.2)
Chloride: 106 meq/L (ref 99–111)
Creatinine: 0.7 mg/dL (ref 0.4–1.0)
GFR: >60 mL/min/1.73 m2 (ref >=60.0)
Glucose: 96 mg/dL (ref 70–100)
Potassium: 3.9 meq/L (ref 3.5–5.3)
Sodium: 137 meq/L (ref 135–145)

## 2024-11-11 LAB — MAGNESIUM: Magnesium: 1.9 mg/dL (ref 1.6–2.6)

## 2024-11-11 LAB — WHOLE BLOOD GLUCOSE POCT
Whole Blood Glucose POCT: 111 mg/dL — ABNORMAL HIGH (ref 70–100)
Whole Blood Glucose POCT: 94 mg/dL (ref 70–100)
Whole Blood Glucose POCT: 94 mg/dL (ref 70–100)

## 2024-11-11 LAB — NT-PROBNP
NT-ProBNP: 1864.8 pg/mL — ABNORMAL HIGH (ref <=334.1)
NT-ProBNP: 1554.1 pg/mL — ABNORMAL HIGH (ref <=334.1)

## 2024-11-11 LAB — ANTI-XA,UFH
Anti-Xa, UFH: 0.54 [IU]/mL
Anti-Xa, UFH: 0.4 [IU]/mL

## 2024-11-11 LAB — LACTIC ACID
Whole Blood Lactic Acid: 0.8 mmol/L (ref 0.4–2.0)
Whole Blood Lactic Acid: 1.3 mmol/L (ref 0.4–2.0)

## 2024-11-11 LAB — HIGH SENSITIVITY TROPONIN-I
hs Troponin: 82.7 ng/L (ref <=14.0)
hs Troponin: 58.8 ng/L — ABNORMAL HIGH (ref <=14.0)

## 2024-11-11 LAB — PHOSPHORUS: Phosphorus: 3.5 mg/dL (ref 2.3–4.7)

## 2024-11-11 LAB — LAB USE ONLY - URINE GRAY CULTURE HOLD TUBE

## 2024-11-11 MED ORDER — SODIUM PHOSPHATES 3 MMOLE/ML IV SOLN (WRAP): 25.0000 mmol | INTRAVENOUS | Status: DC | PRN

## 2024-11-11 MED ORDER — POTASSIUM CHLORIDE 20 MEQ PO PACK: 0.0000 meq | PACK | ORAL | Status: DC | PRN

## 2024-11-11 MED ORDER — SODIUM PHOSPHATES 3 MMOLE/ML IV SOLN (WRAP): 15.0000 mmol | INTRAVENOUS | Status: DC | PRN

## 2024-11-11 MED ORDER — MAGNESIUM SULFATE IN D5W 1-5 GM/100ML-% IV SOLN
1.0000 g | INTRAVENOUS | Status: DC | PRN
  Administered 2024-11-11 (×2): 1 g via INTRAVENOUS
  Filled 2024-11-11 (×2): qty 100

## 2024-11-11 MED ORDER — BENZOCAINE-MENTHOL MT LOZG (WRAP)
1.0000 | LOZENGE | OROMUCOSAL | Status: DC | PRN
  Administered 2024-11-11 (×2): 1 via BUCCAL
  Filled 2024-11-11 (×2): qty 1

## 2024-11-11 MED ORDER — POTASSIUM CHLORIDE 10 MEQ/100ML IV SOLN (WRAP): 10.0000 meq | INTRAVENOUS | Status: DC | PRN

## 2024-11-11 MED ORDER — POTASSIUM CHLORIDE CRYS ER 20 MEQ PO TBCR
0.0000 meq | EXTENDED_RELEASE_TABLET | ORAL | Status: DC | PRN
  Administered 2024-11-11: 20 meq via ORAL
  Filled 2024-11-11: qty 1

## 2024-11-11 MED ORDER — SODIUM PHOSPHATES 3 MMOLE/ML IV SOLN (WRAP): 35.0000 mmol | INTRAVENOUS | Status: DC | PRN

## 2024-11-11 NOTE — Progress Notes (Signed)
 SBP 130s-140s, MD notified, no new orders. Keep home lisinopril  on hold.

## 2024-11-11 NOTE — Progress Notes (Signed)
 Initial Case Management Assessment and Discharge Planning  [facility]   Patient Name:  Barbara Rubio, Barbara Rubio   Date of Birth  10/01/1951   Attending Physician:  Colan Moselle, Anahita*   Length of Stay 1   Reason for Consult / Chief Complaint 1. Acute saddle pulmonary embolism, unspecified whether acute cor pulmonale present (CMS/HCC)    2. Chest pain, unspecified type    3. Hypoxia    4. Tachypnea          Case Management location: remote   Preferred Pharmacy @ D/C:      [id number] - [place], [place] - [address]  [address]  [place] [address]  Phone: [phone] Fax: [phone]    [id number] - [place], [place] - [address]  [phone]  [place] [address]  Phone: [phone] Fax: [phone]      Primary Care Physician: Lovett Josette HERO, NP               Last PCP visit: within the last year    Spoke with pts dtr, Barbara Rubio, via telephone for CM routine screening, assessment and care coordination for discharge planning, demographics & pharmacy verified, role of Case Management explained, pt was on the phone when this RN entered room on telehealth video. 74 yo F admitted w/ chest pain, cute saddle pulmonary embolism, hypoxia, tachypnea. Indep PTA. Lives alone in her apartment @ [address], dtr to drive at time of [place] All questions answered prior to ending call w/ [name].  CM will continue to follow for hospital discharge needs. The above information has been passed onto pts covering CM    Healthcare Agent: Child    Emergency contact: Extended Emergency Contact Information  Primary Emergency Contact: Barbara Rubio, Barbara Rubio  Address: [address]           [place] , [place] [address] [place]  Home Phone: [phone]  Work Phone: [phone]  Mobile Phone: [phone]  Relation: Daughter     Living arrangements: Apartment        Pt lives alone in an apartment @ [address].    Prior level of functioning:    ADL's: independent                Mobility: independent w/ walker              Driving: independent    DME:  Rollator (4 wheeled walker)  Shower chair  Grab bars, pull cords, BP cuff    Home or community services:  The patient is not currently receiving home health services.    Home health agencies used in the past: none    Past/present inpatient rehab: none    Support systems: dtr    SDOH barriers: None at this time    Discharge transportation: Mode of transportation: [employer] - Family/Friend to drive patient. Pts dtr will provide transportation at time of [place].     D/C Plan: home, watch for CM needs    BARRIERS TO DISCHARGE:  Medical readiness    Delon RONAL Cunning, BSN RN  Assessment Case Manager  [facility]  [address], [address]  [place], [place] [address]  T [phone]  F [phone]

## 2024-11-11 NOTE — Plan of Care (Signed)
 Problem: Inadequate Gas Exchange  Goal: Adequate oxygenation and improved ventilation  Outcome: Progressing  Flowsheets (Taken 11/10/2024 1547 by Elnor Crank, RN)  Adequate oxygenation and improved ventilation:   Assess lung sounds   Monitor SpO2 and treat as needed   Monitor and treat ETCO2   Position for maximum ventilatory efficiency   Provide mechanical and oxygen support to facilitate gas exchange   Teach/reinforce use of incentive spirometer 10 times per hour while awake, cough and deep breath as needed   Plan activities to conserve energy: plan rest periods   Increase activity as tolerated/progressive mobility   Consult/collaborate with Respiratory Therapy     Problem: Safety  Goal: Patient will be free from injury during hospitalization  Outcome: Progressing  Flowsheets (Taken 11/11/2024 1022)  Patient will be free from injury during hospitalization:   Assess patient's risk for falls and implement fall prevention plan of care per policy   Use appropriate transfer methods   Ensure appropriate safety devices are available at the bedside   Include patient/ family/ care giver in decisions related to safety   Hourly rounding   Assess for patients risk for elopement and implement Elopement Risk Plan per policy   Provide alternative method of communication if needed (communication boards, writing)   Provide and maintain safe environment  Goal: Patient will be free from infection during hospitalization  Outcome: Progressing  Flowsheets (Taken 11/11/2024 1022)  Free from Infection during hospitalization:   Assess and monitor for signs and symptoms of infection   Monitor all insertion sites (i.e. indwelling lines, tubes, urinary catheters, and drains)   Encourage patient and family to use good hand hygiene technique   Monitor lab/diagnostic results

## 2024-11-11 NOTE — Progress Notes (Signed)
 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 Clinically improving on anticoagulation alone, now down to 1L from 6L satting >95%. Continues to be hemodynamically stable.    Plan:   - Patient re-evaluated today. Daughter at bedside. In agreement with continuing medical management with anticoagulation given clinical improvement.  - No plans for thrombectomy  - Follow up with pulm as OP  - No further IR needs at this time  - D/w primary team    Subjective:   Feeling better, chest pain has resolved. Denies any SOB.       Physical Exam:     Vitals:    11/11/24 0900   BP: 137/88   Pulse: 99   Resp: 21   Temp:    SpO2: 95%       General: no acute distress, appears comfortable, sitting in recliner  HEENT: atraumatic, anicteric, neck supple  Lungs: non-labored breathing on 1L O2      Labs:     Lab Results   Component Value Date/Time    WBC 7.31 11/11/2024 04:08 AM    WBC 4.84 01/17/2022 09:10 AM    HCT 35.4 11/11/2024 04:08 AM    HCT 37.6 01/17/2022 09:10 AM    PLT 145 11/11/2024 04:08 AM    PLT 182 01/17/2022 09:10 AM    INR 1.0 06/09/2020 11:26 AM    PT 11.5 06/09/2020 11:26 AM    PTT 27 11/10/2024 04:29 AM    BUN 7 11/11/2024 04:08 AM    BUN 12.0 02/05/2023 08:07 AM    CREAT 0.7 11/11/2024 04:08 AM    CREAT 0.8 02/05/2023 08:07 AM    GLU 96 11/11/2024 04:08 AM    GLU 86 02/05/2023 08:07 AM    K 3.9 11/11/2024 04:08 AM    K 4.5 02/05/2023 08:07 AM        Rads:   Echo Adult TTE Complete  Result Date: 11/10/2024  Date: 11/10/2024  Left ventricular systolic function is normal with an ejection fraction by Biplane Method of Discs of  68 %. Left ventricular segmental wall motion is normal. Left ventricular diastolic function could not be accurately assessed due to tachycardia. Mildly decreased right ventricular systolic function. There is mild tricuspid regurgitation. 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 Moderate pulmonary hypertension with estimated right ventricular systolic pressure of  46 mmHg. Compared to the prior study, estimated pulmonary pressures are elevated with decreased RV systolic function and dilated IVC. Findings reported to primary team, Lenward Buys, PA at 12:08 pm on 11/11/23.      US  Venous Low Extrem Duplx Dopp Comp Bilat  Result Date: 11/10/2024  1.Occlusive thrombosis of the right distal femoral vein, popliteal vein, posterior tibial veins and one of the peroneal veins. 2.No sonographic evidence for left lower extremity deep venous thrombosis. Artie Russella Estrin, MD 11/10/2024 10:34 AM       Iona Kurt Rosalita DEVONNA    [facility]  [facility], Division of Vascular & Interventional Radiology  [facility] [phone] (inpatient)  [facility] [phone]  (outpatient)  [facility]:  [phone]  [facility]: [phone]  [facility] [phone]  [facility] [phone]    [facility] Office: [phone]  After hours/Answering service:  [phone]

## 2024-11-11 NOTE — Plan of Care (Signed)
 Problem: Pain interferes with ability to perform ADL  Goal: Pain at adequate level as identified by patient  Flowsheets (Taken 11/11/2024 2118)  Pain at adequate level as identified by patient:   Identify patient comfort function goal   Assess pain on admission, during daily assessment and/or before any as needed intervention(s)   Reassess pain within 30-60 minutes of any procedure/intervention, per Pain Assessment, Intervention, Reassessment (AIR) Cycle   Evaluate if patient comfort function goal is met     Problem: Hemodynamic Status: Cardiac  Goal: Stable vital signs and fluid balance  Flowsheets (Taken 11/10/2024 1547 by Elnor Crank, RN)  Stable vital signs and fluid balance:   Assess signs and symptoms associated with cardiac rhythm changes   Monitor lab values     Problem: Inadequate Tissue Perfusion  Goal: Adequate tissue perfusion will be maintained  Flowsheets (Taken 11/10/2024 1547 by Elnor Crank, RN)  Adequate tissue perfusion will be maintained:   Monitor/assess lab values and report abnormal values   Monitor/assess neurovascular status (pulses, capillary refill, pain, paresthesia, paralysis, presence of edema)   Monitor/assess for signs of VTE (edema of calf/thigh redness, pain)   Monitor for signs and symptoms of a pulmonary embolism (dyspnea, tachypnea, tachycardia, confusion)   Encourage/assist patient as needed to turn, cough, and perform deep breathing every 2 hours     Problem: Ineffective Gas Exchange  Goal: Effective breathing pattern  Flowsheets (Taken 11/10/2024 1547 by Elnor Crank, RN)  Effective breathing pattern: Teach/reinforce use of ordered respiratory interventions (ie. CPAP, BiPAP, Incentive Spirometer, Acapella)     Problem: Inadequate Gas Exchange  Goal: Adequate oxygenation and improved ventilation  Flowsheets (Taken 11/10/2024 1547 by Elnor Crank, RN)  Adequate oxygenation and improved ventilation:   Assess lung sounds   Monitor SpO2 and treat as needed   Monitor and  treat ETCO2   Position for maximum ventilatory efficiency   Provide mechanical and oxygen support to facilitate gas exchange   Teach/reinforce use of incentive spirometer 10 times per hour while awake, cough and deep breath as needed   Plan activities to conserve energy: plan rest periods   Increase activity as tolerated/progressive mobility   Consult/collaborate with Respiratory Therapy     Problem: Compromised Sensory Perception  Goal: Sensory Perception Interventions  Flowsheets (Taken 11/11/2024 2000)  Sensory Perception Interventions: Offload heels, Pad bony prominences, Reposition q 2hrs/turn Clock, Q2 hour skin assessment under devices if present     Problem: Compromised Moisture  Goal: Moisture level Interventions  Flowsheets (Taken 11/11/2024 2000)  Moisture level Interventions: Moisture wicking products, Moisture barrier cream     Problem: Compromised Activity/Mobility  Goal: Activity/Mobility Interventions  Flowsheets (Taken 11/11/2024 2000)  Activity/Mobility Interventions: Pad bony prominences, TAP Seated positioning system when OOB, Promote PMP, Reposition q 2 hrs / turn clock, Offload heels     Problem: Compromised Nutrition  Goal: Nutrition Interventions  Flowsheets (Taken 11/11/2024 2000)  Nutrition Interventions: Discuss nutrition at Rounds, I&Os, Document % meal eaten, Daily weights     Problem: Compromised Friction/Shear  Goal: Friction and Shear Interventions  Flowsheets (Taken 11/11/2024 2000)  Friction and Shear Interventions: Pad bony prominences, Off load heels, HOB 30 degrees or less unless contraindicated, Consider: TAP seated positioning, Heel foams     Problem: Moderate/High Fall Risk Score >5  Goal: Patient will remain free of falls  Flowsheets (Taken 11/11/2024 2118)  High (Greater than 13):   HIGH-Consider use of low bed   HIGH-Utilize chair pad alarm for patient while in the chair  HIGH-Visual cue at entrance to patient's room   HIGH-Initiate use of floor mats as appropriate   HIGH-Apply  yellow Fall Risk arm band   HIGH-Bed alarm on at all times while patient in bed     Problem: Safety  Goal: Patient will be free from injury during hospitalization  Flowsheets (Taken 11/11/2024 1022 by Francella Doffing Heart, RN)  Patient will be free from injury during hospitalization:   Assess patient's risk for falls and implement fall prevention plan of care per policy   Use appropriate transfer methods   Ensure appropriate safety devices are available at the bedside   Include patient/ family/ care giver in decisions related to safety   Hourly rounding   Assess for patients risk for elopement and implement Elopement Risk Plan per policy   Provide alternative method of communication if needed (communication boards, writing)   Provide and maintain safe environment  Goal: Patient will be free from infection during hospitalization  Flowsheets (Taken 11/11/2024 1022 by Francella Doffing Heart, RN)  Free from Infection during hospitalization:   Assess and monitor for signs and symptoms of infection   Monitor all insertion sites (i.e. indwelling lines, tubes, urinary catheters, and drains)   Encourage patient and family to use good hand hygiene technique   Monitor lab/diagnostic results

## 2024-11-11 NOTE — Progress Notes (Addendum)
 11/11/24 1525   Volunteer Chaplain   Visit Type Initial   Source Chaplain Initiated   Present at Visit Patient   Spiritual Care Provided to Patient   Reason for Visit Emotional Support;Spiritual Support   Spiritual Care Interventions Provided emotional support;Explored belief and/or faith issues;Provided reflective and compassionate listening;Provided spiritual encouragement;Provided prayer   Spiritual Care Outcomes Made a positive connection with patient;Patient expressed appreciation of visit   Length of Visit 0-15 minutes   Follow-up No follow-up at this time     [facility]  Spiritual Care Volunteer  [email or web address]  [phone]  Pager [phone]

## 2024-11-11 NOTE — Progress Notes (Signed)
 CRITICAL CARE  [facility]- Critical Care  Note      Daily progress note      Date Time: 11/11/2024 12:14 PM  Patient Name: Barbara Rubio  Attending Physician: Colan Rowan Ross*  Primary Care Physician: Lovett Josette HERO, NP  Location/Room: T698/T698-J   Type of Admission: Inpatient    Chief Complaint / Primary Reason for Critical Care evaluation :      Chief Complaint   Patient presents with    Chest Pain    Shortness of Breath        History of Presenting Illness:   74 year old female with PMH of HTN, HLD, GERD, hiatal hernia, and prior sleeve gastrectomy presents with acute chest pain and shortness of breath. Symptoms began suddenly about 40 minutes prior to arrival. Chest pain is described as tightness and pressure with a burning, indigestion-like sensation.  . Shortness of breath is new and associated with the chest discomfort. She reports a normal cardiac evaluation four months ago. In ER :   CT: .Bilateral acute pulmonary emboli with evidence of high clot burden and right cardiac strain. Recommend interventional radiology consultation to guide further management.  Troponin 147 down to 22 BNP 450,   Patient desatted and was placed on oxygen 4 to 6 L NC and SO2 improved to 97%.      Subjective/24 hours events   No acute event overnight  She is doing much better.  Her chest tightness improved.  She is on room air.  Yesterday she was on 6 L oxygen.  Hemodynamically stable.  We started diet.       Assessment:   Problem List: Problem List[1]    Review Problem list    Plan:   NEURO  1. No acute neurologic deficit  - Alert and oriented, no focal neurologic symptoms  - Monitor mental status given hypoxia risk    CARDIAC  . Right ventricular strain with myocardial injury secondary to PE  - Elevated troponin (147 ? 22) and BNP 450, trending down and stable  - CT evidence of right heart strain  - Telemetry monitoring  - Trend troponin/BNP  - Transthoracic  echocardiogram; EF 68%, elevated RA pressure of 15 mmHg.    * Moderate pulmonary hypertension with estimated right ventricular systolic pressure of  46 mmHg.  -Continue heparin  infusion, no plan for thrombectomy, will switch to Eliquis  later    . Chest pain  - Likely secondary to pulmonary embolism  - Recent cardiac workup (4 months ago) reportedly normal  - Continue cardiac monitoring   Hypertension  - On lisinopril  10 mg daily  - She is normotensive , hold for now,  . Hyperlipidemia  - On statin therapy  - Continue statin    RESPIRATORY  . Acute bilateral pulmonary embolism with high clot burden  - CT chest confirms bilateral PE with RV strain  - IV anticoagulation (heparin )  - Interventional Radiology consultation done and we updated them about echo results on 1/19 and recommended for heparin  infusion  . Acute hypoxic respiratory failure  - Desaturation requiring 4-6 L NC improved and now is on room air  - Oxygen therapy to maintain SpO? >92%    GI  . GERD / hiatal hernia  - Burning chest sensation may be contributory  - Continue acid suppression  - Avoid NSAIDs while anticoagulated  . History of sleeve gastrectomy  - Monitor medication absorption and nutritional status    GU  .  Nocturia   - No dysuria or infection symptoms  - Monitor intake/output and renal function    ID / HEME  .   venous thromboembolism; PE+DVT; in setting of sedentary life, using estradiol , Wegovy   -- Anticipate long-term anticoagulation  -Follow hematology as outpatient to do full workup  -Continue heparin  and then will switch to Eliquis  possibly tomorrow if hemodynamically stable    # MSK  -Sciatica, knee arthritis    -Class I obesity    Endo:   Goal glucose level 140-180.     Integument:    GENERAL CRITICAL CARE ASSESSMENT/ PLANS- IF APPLICABLE    Vascular Access:  GI Prophylaxis:   VTE Prophylaxis:Hep  Nutrition:   Sedation:  Foley Catheter:   AM Labs and Chest xray ordered:   Code Status: full code  Disposition:    Present on  Admission:        Past Medical History:   Medical History[2]    Past Surgical History:   Past Surgical History[3]    Family History:   Family History[4]    Social History:   Social History[5]    Allergies:   Allergies[6]    Medications:     Medications Prior to Admission   Medication Sig    acetaminophen  (TYLENOL ) 650 MG CR tablet Take 1 tablet (650 mg) by mouth as needed for Pain    atorvastatin  (LIPITOR) 20 MG tablet Take 1 tablet (20 mg) by mouth once daily    diclofenac  Sodium (Voltaren ) 1 % Gel topical gel Apply topically as needed    estradiol  (ESTRACE ) 0.1 MG/GM vaginal cream Place 0.5 g vaginally twice a week    lisinopril  (ZESTRIL ) 10 MG tablet Take 1 tablet (10 mg) by mouth once daily    Myrbetriq 50 MG Tablet SR 24 hr Take 1 tablet (50 mg) by mouth once daily    Wegovy  0.25 MG/0.5ML injection Inject 0.5 mLs (0.25 mg) into the skin once a week    Inclisiran Sodium (LEQVIO SC) Inject into the skin every 6 (six) months    [DISCONTINUED] carvedilol  (COREG ) 12.5 MG tablet Take 1 tablet (12.5 mg) by mouth 2 (two) times daily with meals Hold for SBP<166mmHg or heart rate less than 55 bpm    [DISCONTINUED] ibuprofen  (ADVIL ) 200 MG tablet Take 1 tablet (200 mg) by mouth as needed for Pain       Current Inpatient :  Current Facility-Administered Medications[7]    Home Medications :   Prescriptions Prior to Admission[8]     Review of Systems:   All 11 systems reviewed all negative except mentioned above    Physical Exam:     Vitals:    11/11/24 1102   BP:    Pulse:    Resp:    Temp: 99.1 F (37.3 C)   SpO2:        Intake and Output Summary (Last 24 hours) at Date Time    Intake/Output Summary (Last 24 hours) at 11/11/2024 1214  Last data filed at 11/11/2024 1102  Gross per 24 hour   Intake 830.24 ml   Output 1625 ml   Net -794.76 ml       Physical Exam:    Physical Exam    Physical Exam  Constitutional:       Appearance: Normal appearance.   HENT:      Head: Normocephalic.      Right Ear: Tympanic membrane normal.       Nose: Nose normal.  Mouth/Throat:      Mouth: Mucous membranes are moist.   Cardiovascular:      Rate and Rhythm: Normal rate.      Pulses: Normal pulses.   Pulmonary:      Effort: Pulmonary effort is normal.  Bibasilar crackle and decreasing breathing sound  Abdominal:      General: Abdomen is flat.   Musculoskeletal:         General: Normal range of motion.      Cervical back: Normal range of motion.   Skin:     General: Skin is warm.      Capillary Refill: Capillary refill takes less than 2 seconds.   Neurological:      Mental Status: He is alert.              Labs:     Labs (last 72 hours):  Recent Labs     11/11/24  0408 11/10/24  0429   WBC 7.31 7.68   Hemoglobin 11.3* 12.7   Hematocrit 35.4 41.0     Recent Labs     11/10/24  0429   PTT 27    Recent Labs     11/11/24  0408 11/10/24  0429   Sodium 137 139   Potassium 3.9 3.5   Chloride 106 105   CO2 22 25   BUN 7 7   Creatinine 0.7 0.8   Glucose 96 141*   Calcium  8.5 9.2   Magnesium  1.9  --    Phosphorus 3.5  --                Radiology / Imaging:     EKG Results       Procedure Component Value Units Date/Time    ECG 12 lead [id number] Collected: 11/10/24 0415     Updated: 11/10/24 0415     Ventricular Rate 109 BPM      Atrial Rate 109 BPM      P-R Interval 170 ms      QRS Duration 82 ms      Q-T Interval 330 ms      QTC Calculation (Bezet) 444 ms      P Axis 52 degrees      R Axis -19 degrees      T Axis 32 degrees      IHS MUSE NARRATIVE AND IMPRESSION --     SINUS TACHYCARDIA  POSSIBLE LEFT ATRIAL ENLARGEMENT  POSSIBLE ANTERIOR MYOCARDIAL INFARCTION , AGE UNDETERMINED  ABNORMAL ECG  WHEN COMPARED WITH ECG OF 13-Jun-2024 09:57,  NON-SPECIFIC CHANGE IN ST SEGMENT IN INFERIOR LEADS  NONSPECIFIC T WAVE ABNORMALITY NOW EVIDENT IN ANTERIOR LEADS      Narrative:      SINUS TACHYCARDIA  POSSIBLE LEFT ATRIAL ENLARGEMENT  POSSIBLE ANTERIOR MYOCARDIAL INFARCTION , AGE UNDETERMINED  ABNORMAL ECG  WHEN COMPARED WITH ECG OF 13-Jun-2024 09:57,  NON-SPECIFIC CHANGE IN  ST SEGMENT IN INFERIOR LEADS  NONSPECIFIC T WAVE ABNORMALITY NOW EVIDENT IN ANTERIOR LEADS            Other ICU Data   Vent Settings:         Invasive ICU Hemodynamics:             Line, Drains,Airway :                        Critical care provider statement:     Critical care time (minutes):  60    I have personally  reviewed the patients history and 24 hour interval events, along with vitals, labs, radiology images and nursing.     Critical care time was exclusive of separately billable procedures and treating other patients and teaching time.   Critical care was time spent personally by me on one or more of the following activities: discussions with consultants, development of treatment plan with patient or surrogate, evaluation of patient's response to treatment, examination of patient, obtaining history from patient or surrogate, ordering and performing treatments and interventions, ordering and review of laboratory studies, ordering and review of radiographic studies, pulse oximetry, re-evaluation of patient's condition and review of old charts.   At least one organ system is acutely impaired.   There is a high probability of imminent, life-threatening deterioration.   I intervened to try to prevent further deterioration of the patient's condition.            Signed by:  Nemesis Rainwater Colan Moselle, MD                       [facility] Critical Care                   MD x [phone]   NP x [phone]            11/11/2024 12:14 PM          Attending Attestation:     I have personally seen and examined the patient and performed the substantive portion of the visit with my medical decision making. I have reviewed medical records, vitals, labs, imaging, and consultation notes within epic and care everywhere. I have independently reviewed today's events and data used in developing today's plan of care. I agree with the documentation, assessment and plan of Barbara Rubio as recorded by Hattie Colan Moselle, MD  with  additions and exceptions as noted:        Colan Moselle, Izac Faulkenberry*         rr:[id number], Josette HERO, NP            [1]   Patient Active Problem List  Diagnosis    Primary hypertension    Mixed hyperlipidemia    Gastroesophageal reflux disease    Hiatal hernia    Vitamin D  deficiency    Severe obesity (CMS/HCC)    S/P laparoscopic sleeve gastrectomy    Obesity (BMI 30-39.9)    LUQ abdominal pain    Chronic constipation    Other low back pain    Left leg pain    Radiculopathy, lumbar region    ASCVD (arteriosclerotic cardiovascular disease)    Acute saddle pulmonary embolism, unspecified whether acute cor pulmonale present (CMS/HCC)   [2]   Past Medical History:  Diagnosis Date    Abnormal vision 10/23/1998    wears glasses    Arthritis     Constipation     HX - HAS BEEN MORE PREVELANT POST BARIATRIC SURGERY.    COVID-19 10/2021    HX - 21//23 URGENT CARE NOTE IN EPIC.    Difficulty walking 05/24/2024    Due to nerve pinched in spine & 2-3-4 lumbar in back spine area    Fibroids     PT HAD A PARTIAL HYSTERECTOMY - OVARIES ARE STILL IN PLACE.    Gastroesophageal reflux disease 10/23/2013    HX.    Hypercholesterolemia 03/23/2010    High Bad Cholesterol Level    Hyperlipidemia 10/23/2016    MANAGED WITH MEDS.    Hypertension  07/17/2024    PT STATES HE BP RUNS FROM 128-130/70s. MANAGED WITH MEDS - FOLLOWED BY PCP.    Left wrist fracture 2018    HX    Low back pain 05/24/2024    Pinched nerve & arthritis    LUQ pain     HX    Pain in joint 05/24/2024    Left knee, thigh and leg    Psoriatic arthritis (CMS/HCC)     HX - LOWER BACK, LEFT HAND, WRIST AND FINGERS    Urinary incontinence 03/23/2017    HX OVERACTIVE BLADDER. ISSUE RESOLVED POST BARIATRIC SURGERY - PT STATED SHE LOST 65 LBS.   [3]   Past Surgical History:  Procedure Laterality Date    BUNIONECTOMY Left     COLONOSCOPY, DIAGNOSTIC (SCREENING)      X 2     COLONOSCOPY, DIAGNOSTIC (SCREENING) N/A 04/13/2021    Procedure: COLONOSCOPY;  Surgeon: Ulysess Foots, MD;   Location: [id number];  Service: Gastroenterology;  Laterality: N/A;    EGD N/A 05/18/2020    Procedure: EGD w/ bx's;  Surgeon: Marcey Sammi BIRCH, MD;  Location: [facility];  Service: Gastroenterology;  Laterality: N/A;    EGD  12/2020    EGD N/A 01/24/2022    Procedure: EGD w/ Bx's;  Surgeon: Marcey Sammi BIRCH, MD;  Location: [facility];  Service: Gastroenterology;  Laterality: N/A;    HYSTERECTOMY  03/23/1997    PARTIAL - OVARIES IN PLACE.    LAPAROSCOPIC GASTRECTOMY SLEEVE WITH HIATAL HERNIA N/A 07/28/2020    Procedure: LAPAROSCOPIC GASTRECTOMY SLEEVE WITH HIATAL HERNIA, OMENTOPEXY, LAPAROSCOPIC RESECTION OF ANTRAL AND FUNDIC GASTRIC MASS;  Surgeon: Eliberto Oris SAUNDERS, DO;  Location: [place] [facility];  Service: General;  Laterality: N/A;   [4]   Family History  Problem Relation Name Age of Onset    Hypertension Mother Tommy Snell         Mother was in her 30s when she had high blood pressure. now deceased    Arthritis Mother Tommy Snell         Had psoreactic arthristic    Obesity Mother Tommy Snell         Mother was obese w.BMI over 38- 40    Diabetes Mother Tommy Snell 49 - 40    Hypertension Father Todd Shed         Father was in his 36s when diagonsis with high blood pressure. now deceased    Arthritis Father Todd Shed         had psoreactic arthristic    Heart disease Father Todd Shed     Heart failure Father Todd Shed 49 - 40    Hypertension Sister      Hypertension Sister Alfonse Caldron         Had high BPressure in her early 25s now deceased    Hypertension Brother Lynwood Howell in his 27s with HB Pressure    Stroke Brother Lynwood 39 - 30    Hypertension Maternal Grandfather Drue Snell         Not sure of onset age but know he had High BP 60-86 yrs old    Heart disease Paternal Grandmother      Migraines Neg Hx      Seizures Neg Hx     [5]   Social History  Socioeconomic History    Marital status: Divorced   Tobacco Use    Smoking status: Never  Smokeless tobacco:  Never    Tobacco comments:     Never smoked or used tobacco   Vaping Use    Vaping status: Never Used   Substance and Sexual Activity    Alcohol use: Yes     Alcohol/week: 0.0 - 1.0 standard drinks of alcohol     Comment: 1-2 DRINKS MONTHLY    Drug use: Never   Other Topics Concern    Dietary supplements / vitamins Yes    Anesthesia problems No    Blood thinners No    Pregnant No    Future Children No    Number of Pregnancies? Yes     Comment: 1    Number of children Yes     Comment: 1    Miscarriages / Abortions? No    Eats large amounts No    Excessive Sweets Yes    Skips meals No    Eats excessive starches Yes    Snacks or grazes Yes    Emotional eater No    Eats fried food Yes    Eats fast food Yes    Diet Center No    Randall Banks Yes    LA Weight Loss No    Nutri-System Yes    Opti-Fast / Medi-Fast No    Overeaters Anonymous No    [facility] No    TOPS No    Weight Watchers Yes    Atkins No    Binging / Purging No    Calorie Counting Yes    Fasting No    High Protein Yes    Low Carb Yes    Low Fat Yes    [facility] Diet No    Slim Fast No    [place] Yes    Stationary cycle or treadmill No    Gym/fitness Classes No    Home exercise/video No    Swimming No    Weight training No    Walking or running Yes    Hospitalization No    Hypnosis No    Physical therapy No    Psychological therapy No    Residential program No    Acutrim No    Byetta No    Contrave No    Dexatrim No    Diethylpropion No    Fastin  No    Fen - Phen No    Ionamin / Adipex No    Phentermine  No    Qsymia No    Prozac No    Saxenda No    Topamax  No    Wellbutrin No    Xenical (Orlistat, Alli) No    Other Med No    No impairment No    Walks with cane/crutch No    Requires a wheelchair No    Bedridden No    Are you currently being treated for depression? No    Do you snore? Yes    Are you receiving any medical or psychological services? No    Do you ever wake up at night gasping for breath? Yes    Do you have or have you been  treated for an eating disorder? No    Anyone ever told you that you stop breathing while asleep? No    Do you exercise regularly? No    Have you or family member ever have trouble with anesthesia? Yes     Social Drivers of Health     Financial Resource Strain: Medium Risk (08/22/2024)  Overall Financial Resource Strain (CARDIA)     Difficulty of Paying Living Expenses: Somewhat hard   Food Insecurity: No Food Insecurity (11/10/2024)    Hunger Vital Sign     Worried About Running Out of Food in the Last Year: Never true     Ran Out of Food in the Last Year: Never true   Recent Concern: Food Insecurity - Food Insecurity Present (08/22/2024)    Hunger Vital Sign     Worried About Running Out of Food in the Last Year: Sometimes true     Ran Out of Food in the Last Year: Sometimes true   Transportation Needs: No Transportation Needs (11/10/2024)    PRAPARE - Therapist, Art (Medical): No     Lack of Transportation (Non-Medical): No   Physical Activity: Inactive (08/22/2024)    Exercise Vital Sign     Days of Exercise per Week: 0 days     Minutes of Exercise per Session: 0 min   Stress: Stress Concern Present (08/22/2024)    [employer] of Occupational Health - Occupational Stress Questionnaire     Feeling of Stress : Rather much   Social Connections: Moderately Integrated (12/31/2023)    Social Connection and Isolation Panel     Frequency of Communication with Friends and Family: More than three times a week     Frequency of Social Gatherings with Friends and Family: More than three times a week     Attends Religious Services: More than 4 times per year     Active Member of [employer] or Organizations: Yes     Attends Engineer, Structural: More than 4 times per year     Marital Status: Divorced   Intimate Partner Violence: Not At Risk (11/10/2024)    Humiliation, Afraid, Rape, and Kick questionnaire     Fear of Current or Ex-Partner: No     Emotionally Abused: No     Physically Abused:  No     Sexually Abused: No   Housing Stability: Not At Risk (11/10/2024)    Housing Stability NCSS     Do you have housing?: Yes     Are you worried about losing your housing?: No   [6] No Known Allergies  [7]   Current Facility-Administered Medications   Medication Dose Route Frequency    atorvastatin   20 mg Oral Daily    pantoprazole   40 mg Intravenous Daily    senna-docusate  1 tablet Oral Q12H SCH   [8]   Medications Prior to Admission   Medication Sig Dispense Refill Last Dose/Taking    acetaminophen  (TYLENOL ) 650 MG CR tablet Take 1 tablet (650 mg) by mouth as needed for Pain   Past Week    atorvastatin  (LIPITOR) 20 MG tablet Take 1 tablet (20 mg) by mouth once daily 90 tablet 3 11/09/2024    diclofenac  Sodium (Voltaren ) 1 % Gel topical gel Apply topically as needed   Past Week    estradiol  (ESTRACE ) 0.1 MG/GM vaginal cream Place 0.5 g vaginally twice a week   Past Month    lisinopril  (ZESTRIL ) 10 MG tablet Take 1 tablet (10 mg) by mouth once daily 90 tablet 3 11/09/2024    Myrbetriq 50 MG Tablet SR 24 hr Take 1 tablet (50 mg) by mouth once daily   11/09/2024    Wegovy  0.25 MG/0.5ML injection Inject 0.5 mLs (0.25 mg) into the skin once a week 4 mL 1 11/04/2024    Inclisiran  Sodium (LEQVIO SC) Inject into the skin every 6 (six) months   07/01/2024    [DISCONTINUED] carvedilol  (COREG ) 12.5 MG tablet Take 1 tablet (12.5 mg) by mouth 2 (two) times daily with meals Hold for SBP<164mmHg or heart rate less than 55 bpm 180 tablet 3     [DISCONTINUED] ibuprofen  (ADVIL ) 200 MG tablet Take 1 tablet (200 mg) by mouth as needed for Pain

## 2024-11-11 NOTE — Telephone Encounter (Signed)
 Spoke with the patient's daughter. Barbara Rubio is currently in [facility] being admitted from ER as per daughter.    She was expressing concerns about the patient's plan of care, and was unsure about thrombectomy that was mentioned as one of the options.    Advised the daughter to discuss plan of care with the hospitalist;the consult by one of the members of [facility] cardiology team could be facilitated.    The daughter wanted Dr Evetta to be aware about the patient's condition; she was requesting call from Dr Evetta. Dr Evetta, kindly review.

## 2024-11-12 LAB — BASIC METABOLIC PANEL
Anion Gap: 7 (ref 5.0–15.0)
BUN: 9 mg/dL (ref 7–21)
CO2: 23 meq/L (ref 17–29)
Calcium: 8.4 mg/dL (ref 7.9–10.2)
Chloride: 107 meq/L (ref 99–111)
Creatinine: 0.8 mg/dL (ref 0.4–1.0)
GFR: >60 mL/min/1.73 m2 (ref >=60.0)
Glucose: 116 mg/dL — ABNORMAL HIGH (ref 70–100)
Potassium: 4 meq/L (ref 3.5–5.3)
Sodium: 137 meq/L (ref 135–145)

## 2024-11-12 LAB — ANTI-XA,UFH: Anti-Xa, UFH: 0.33 [IU]/mL

## 2024-11-12 LAB — CBC
Absolute nRBC: 0 x10 3/uL (ref <=0.00)
Hematocrit: 35.6 % (ref 34.7–43.7)
Hemoglobin: 11.1 g/dL — ABNORMAL LOW (ref 11.4–14.8)
MCH: 26.7 pg (ref 25.1–33.5)
MCHC: 31.2 g/dL — ABNORMAL LOW (ref 31.5–35.8)
MCV: 85.6 fL (ref 78.0–96.0)
MPV: 11.7 fL (ref 8.9–12.5)
Platelet Count: 169 x10 3/uL (ref 142–346)
RBC: 4.16 x10 6/uL (ref 3.90–5.10)
RDW: 15 % (ref 11–15)
WBC: 7.7 x10 3/uL (ref 3.10–9.50)
nRBC %: 0 /100{WBCs} (ref <=0.0)

## 2024-11-12 LAB — PHOSPHORUS: Phosphorus: 3.5 mg/dL (ref 2.3–4.7)

## 2024-11-12 LAB — MAGNESIUM: Magnesium: 2.2 mg/dL (ref 1.6–2.6)

## 2024-11-12 MED ORDER — PANTOPRAZOLE SODIUM 40 MG PO TBEC
40.0000 mg | DELAYED_RELEASE_TABLET | Freq: Every morning | ORAL | Status: DC
  Administered 2024-11-13: 40 mg via ORAL
  Filled 2024-11-12: qty 1

## 2024-11-12 MED ORDER — APIXABAN 5 MG PO TABS: 5.0000 mg | ORAL_TABLET | Freq: Two times a day (BID) | ORAL | Status: DC

## 2024-11-12 MED ORDER — APIXABAN 5 MG PO TABS
10.0000 mg | ORAL_TABLET | Freq: Two times a day (BID) | ORAL | Status: DC
  Administered 2024-11-12 – 2024-11-13 (×3): 10 mg via ORAL
  Filled 2024-11-12 (×3): qty 2

## 2024-11-12 NOTE — Progress Notes (Signed)
 RN Shift Note and Trio Rounds template  Date Time: 11/12/2024 7:39 PM  Patient Name: Barbara Rubio, Barbara Rubio  Room No: W4780/W4780.A  Admit Date: 11/10/2024  Length of Stay: 2  Attending Physician: Alfornia Millet, MD   Code Status:Full Code  Admit Status: Inpatient  Shift Note:  Shift Events/ Updates: Admitted from [facility] at 1900  Plan:   -PO Eliquis   - Bowel regimen   - Monitor O2 at night         Trio Rounds Template:   11/12/2024    Shift Events/ Updates: (Call to Physician, RRT, new symptoms, Changes to exam per RN)  Overnight Events: No overnight events     Orientation:Change in Mental Status and ADL's         Assessment:     Vital Signs: (abnormal and pain is the 5th vital sign) SpO2: 92    Pain/VS: 0-No pain    Weight: (Trending, I&O's, Net, diuresing)                                       Respiratory: (increase or decreased O2 demands, O2 needed at home)      Cardiac Rate & Rhythm     Telemetry necessity (Short-Term/ Long-Term)      GI: Bowel concerns    GU: Bladder/urine concerns  Does Patient have Foley Catheter (anticipated removal date)        Diet:      Most Recent diet: Adult diet Regular  Previous diet: Adult diet Therapeutic/ Modified; Clear liquid; Thin (IDDSI level 0)    Patient has abnormal labs (K, Mg, hb, Cr, LFT's, Cultures resulted in last 24 hours)       Patient had Imaging resulted in 24 hours   No results found.    Antibiotics:      Anticoagulants: Yes    Mobility:     Mobility Prescription     PT/OT, sitters            Yes  Yes     Psychosocial:           Psychosocial:     Patient/family have questions on plan of care/ discharge plans  No concerns/questions on plan of care     Medication history updated     Dispense report 100 days   Unit Protocols:     Patient has [employer]  (Anticipated removal date)                                          Fall risk                                                                         Patient has Pressure Ulcer or at Risk                            Glucose less than 100 and on insulin  Readmission in last 30 days        Patient transferred last night from another unit                  7:39 PM   11/12/2024

## 2024-11-12 NOTE — Progress Notes (Signed)
 4 eyes in 4 hours pressure injury assessment note:      Completed with: yasmeen RN   Unit & Time admitted:  5219, 1901            Bony Prominences: Check appropriate box; if Pressure Injury is present enter Pressure Injury assessment in LDA    Occiput:                    []  Pressure Injury present  Face:                        []  Pressure Injury present  Ears:                         []  Pressure Injury present  Spine:                       []  Pressure Injury present  Shoulders:                []  Pressure Injury present  Elbows:                     []  Pressure Injury present  Sacrum/coccyx:        []  Pressure Injury present  Ischial Tuberosity:    []  Pressure Injury present  Trochanter/Hip:        []  Pressure Injury present  Knees:                      []  Pressure Injury present  Ankles:                     []  Pressure Injury present  Heels:                       []  Pressure Injury present  Other pressure areas: []  Pressure Injury location       Device related: []  Device name:         LDA completed if pressure injury present: yes/no  Consult WOCN if necessary    Other skin related issues, ie tears, rash, etc, document in Integumentary flowsheet

## 2024-11-12 NOTE — Plan of Care (Signed)
 Problem: Hemodynamic Status: Cardiac  Goal: Stable vital signs and fluid balance  Outcome: Progressing  Flowsheets (Taken 11/12/2024 2049)  Stable vital signs and fluid balance:   Assess signs and symptoms associated with cardiac rhythm changes   Monitor lab values     Problem: Inadequate Tissue Perfusion  Goal: Adequate tissue perfusion will be maintained  Outcome: Progressing  Flowsheets (Taken 11/12/2024 2049)  Adequate tissue perfusion will be maintained:   Monitor/assess lab values and report abnormal values   Monitor/assess for signs of VTE (edema of calf/thigh redness, pain)   Monitor/assess neurovascular status (pulses, capillary refill, pain, paresthesia, paralysis, presence of edema)   Encourage/assist patient as needed to turn, cough, and perform deep breathing every 2 hours   Monitor for signs and symptoms of a pulmonary embolism (dyspnea, tachypnea, tachycardia, confusion)     Problem: Ineffective Gas Exchange  Goal: Effective breathing pattern  Outcome: Progressing  Flowsheets (Taken 11/12/2024 2049)  Effective breathing pattern: Teach/reinforce use of ordered respiratory interventions (ie. CPAP, BiPAP, Incentive Spirometer, Acapella)     Problem: Inadequate Gas Exchange  Goal: Adequate oxygenation and improved ventilation  Outcome: Progressing  Flowsheets (Taken 11/12/2024 2049)  Adequate oxygenation and improved ventilation:   Assess lung sounds   Monitor SpO2 and treat as needed   Provide mechanical and oxygen support to facilitate gas exchange   Position for maximum ventilatory efficiency   Plan activities to conserve energy: plan rest periods  Goal: Patent Airway maintained  Outcome: Progressing  Flowsheets (Taken 11/12/2024 2049)  Patent airway maintained: Position patient for maximum ventilatory efficiency     Problem: Compromised Activity/Mobility  Goal: Activity/Mobility Interventions  Outcome: Progressing  Flowsheets (Taken 11/12/2024 2049)  Activity/Mobility Interventions: Pad bony prominences,  TAP Seated positioning system when OOB, Promote PMP, Reposition q 2 hrs / turn clock, Offload heels

## 2024-11-12 NOTE — Progress Notes (Addendum)
 CRITICAL CARE  [facility]     [facility]- Critical Care  Note      Daily progress note      Date Time: 11/12/2024 1:40 PM  Patient Name: Barbara Rubio  Attending Physician: Alfornia Millet, MD  Primary Care Physician: Lovett Josette HERO, NP  Location/Room: T698/T698-J   Type of Admission: Inpatient    Chief Complaint / Primary Reason for Critical Care evaluation :      Chief Complaint   Patient presents with    Chest Pain    Shortness of Breath        History of Presenting Illness:   74 year old female with PMH of HTN, HLD, GERD, hiatal hernia, and prior sleeve gastrectomy presents with acute chest pain and shortness of breath. Symptoms began suddenly about 40 minutes prior to arrival. Chest pain is described as tightness and pressure with a burning, indigestion-like sensation.  . Shortness of breath is new and associated with the chest discomfort. She reports a normal cardiac evaluation four months ago. In ER :   CT: .Bilateral acute pulmonary emboli with evidence of high clot burden and right cardiac strain. Recommend interventional radiology consultation to guide further management during admission.  Admitting troponin 147 down to 22 BNP 450,   Patient desatted and was placed on oxygen 4 to 6 L NC and SO2 improved to 97%.  In [facility] patient did very well on heparin  drip.  This morning will transition to oral Eliquis  10 mg twice a day for 7 days then after that 5 mg twice a day.  No provoking factor patient needs to be updated on her cancer surveillance outpatient with her PCP.  Patient will need hypercoagulable workup outpatient after 3 to 6 months completing treatment of anticoagulation.  Discussed with PERT team and interventional radiologist multiple times they reviewed echocardiogram as well no need for intervention continue with medical treatment.  Subjective/24 hours events   No acute event overnight  She is doing much better.  Her chest tightness improved.  She is on room air.   Yesterday she was on 6 L oxygen.  Hemodynamically stable.  We started diet.       Assessment:   Problem List: Problem List[1]    Review Problem list    Plan:   NEURO  1. No acute neurologic deficit  - Alert and oriented, no focal neurologic symptoms  - Monitor mental status given hypoxia risk    CARDIAC  . Right ventricular strain with myocardial injury secondary to PE  - Elevated troponin (147 ? 22) and BNP 450, trending down and stable  - CT evidence of right heart strain  - Telemetry monitoring  - Trend troponin/BNP  - Transthoracic echocardiogram; EF 68%, elevated RA pressure of 15 mmHg.    * Moderate pulmonary hypertension with estimated right ventricular systolic pressure of  46 mmHg.  - Continued heparin  drip until this morning transition to oral Eliquis  10 mg twice a day and then 5 mg twice a day after 7 days.  Outpatient follow-up with PCP to make sure she is updated on her cancer surveillance and after completing treatment anticoagulation 3 to 24-month patient will need hypercoagulable workup as well that can be done with PCP or outpatient follow-up with hematologist.  She has been discussed with PERT team multiple times and also interventional radiologist no intervention needed they are aware of the echocardiogram findings.    . Chest pain  - Likely secondary to pulmonary embolism  -  Recent cardiac workup (4 months ago) reportedly normal  - Continue cardiac monitoring   Hypertension  - On lisinopril  10 mg daily  - She is normotensive , hold for now,  . Hyperlipidemia  - On statin therapy  - Continue statin    RESPIRATORY  . Acute bilateral pulmonary embolism with high clot burden  - CT chest confirms bilateral PE with RV strain  - IV anticoagulation (heparin ) discontinued changed to oral Eliquis .  - Interventional Radiology consultation done and we updated them about echo results on 1/19 and recommended for heparin  infusion  . Acute hypoxic respiratory failure  - Desaturation requiring 4-6 L NC improved  and now is on room air  - Oxygen therapy to maintain SpO? >92%.  We will walk around before she gets transferred to medical floor.    GI  . GERD / hiatal hernia  - Burning chest sensation may be contributory  - Continue acid suppression  - Avoid NSAIDs while anticoagulated  . History of sleeve gastrectomy  - Monitor medication absorption and nutritional status    GU  . Nocturia   - No dysuria or infection symptoms  - Monitor intake/output and renal function    ID / HEME  .   venous thromboembolism; PE+DVT; in setting of sedentary life, using estradiol , Wegovy  there are few case studies Wegovy  causing DVT and PE.  For now we recommend to hold Wegovy .  -- Anticipate long-term anticoagulation  -Follow hematology as outpatient to do full workup  - On Eliquis  discontinue heparin     # MSK  -Sciatica, knee arthritis    -Class I obesity    Endo:   Goal glucose level 140-180.     Integument:    GENERAL CRITICAL CARE ASSESSMENT/ PLANS- IF APPLICABLE    Vascular Access:  GI Prophylaxis:   VTE Prophylaxis:Hep  Nutrition:   Sedation:  Foley Catheter:   AM Labs and Chest xray ordered:   Code Status: full code  Disposition:    Present on Admission:        Past Medical History:   Medical History[2]    Past Surgical History:   Past Surgical History[3]    Family History:   Family History[4]    Social History:   Social History[5]    Allergies:   Allergies[6]    Medications:     Medications Prior to Admission   Medication Sig    acetaminophen  (TYLENOL ) 650 MG CR tablet Take 1 tablet (650 mg) by mouth as needed for Pain    atorvastatin  (LIPITOR) 20 MG tablet Take 1 tablet (20 mg) by mouth once daily    diclofenac  Sodium (Voltaren ) 1 % Gel topical gel Apply topically as needed    estradiol  (ESTRACE ) 0.1 MG/GM vaginal cream Place 0.5 g vaginally twice a week    lisinopril  (ZESTRIL ) 10 MG tablet Take 1 tablet (10 mg) by mouth once daily    Myrbetriq 50 MG Tablet SR 24 hr Take 1 tablet (50 mg) by mouth once daily    Wegovy  0.25 MG/0.5ML  injection Inject 0.5 mLs (0.25 mg) into the skin once a week    Inclisiran Sodium (LEQVIO SC) Inject into the skin every 6 (six) months    [DISCONTINUED] carvedilol  (COREG ) 12.5 MG tablet Take 1 tablet (12.5 mg) by mouth 2 (two) times daily with meals Hold for SBP<131mmHg or heart rate less than 55 bpm    [DISCONTINUED] ibuprofen  (ADVIL ) 200 MG tablet Take 1 tablet (200 mg) by mouth as needed for  Pain       Current Inpatient :  Current Facility-Administered Medications[7]    Home Medications :   Prescriptions Prior to Admission[8]     Review of Systems:   All 11 systems reviewed all negative except mentioned above    Physical Exam:     Vitals:    11/12/24 1300   BP: 130/77   Pulse: 98   Resp: 20   Temp:    SpO2: 94%       Intake and Output Summary (Last 24 hours) at Date Time    Intake/Output Summary (Last 24 hours) at 11/12/2024 1340  Last data filed at 11/12/2024 9047  Gross per 24 hour   Intake 1421.8 ml   Output 1300 ml   Net 121.8 ml       Physical Exam:    Physical Exam  Pulmonary:      Effort: Respiratory distress present.      Breath sounds: Rales present.   Musculoskeletal:         General: No swelling or deformity.      Right lower leg: No edema.   Skin:     Coloration: Skin is pale.         Physical Exam  Constitutional:       Appearance: Normal appearance.   HENT:      Head: Normocephalic.      Right Ear: Tympanic membrane normal.      Nose: Nose normal.      Mouth/Throat:      Mouth: Mucous membranes are moist.   Cardiovascular:      Rate and Rhythm: Normal rate.      Pulses: Normal pulses.   Pulmonary:      Effort: Pulmonary effort is normal.  Bibasilar crackle and decreasing breathing sound  Abdominal:      General: Abdomen is flat.   Musculoskeletal:         General: Normal range of motion.      Cervical back: Normal range of motion.   Skin:     General: Skin is warm.      Capillary Refill: Capillary refill takes less than 2 seconds.   Neurological:      Mental Status: He is alert.              Labs:      Labs (last 72 hours):  Recent Labs     11/12/24  0512 11/11/24  0408 11/10/24  0429   WBC 7.70 7.31 7.68   Hemoglobin 11.1* 11.3* 12.7   Hematocrit 35.6 35.4 41.0     Recent Labs     11/10/24  0429   PTT 27    Recent Labs     11/12/24  0512 11/11/24  0408 11/10/24  0429   Sodium 137 137 139   Potassium 4.0 3.9 3.5   Chloride 107 106 105   CO2 23 22 25    BUN 9 7 7    Creatinine 0.8 0.7 0.8   Glucose 116* 96 141*   Calcium  8.4 8.5 9.2   Magnesium  2.2 1.9  --    Phosphorus 3.5 3.5  --                Radiology / Imaging:     EKG Results       Procedure Component Value Units Date/Time    ECG 12 lead [id number] Collected: 11/10/24 0415     Updated: 11/10/24 0415     Ventricular Rate 109 BPM  Atrial Rate 109 BPM      P-R Interval 170 ms      QRS Duration 82 ms      Q-T Interval 330 ms      QTC Calculation (Bezet) 444 ms      P Axis 52 degrees      R Axis -19 degrees      T Axis 32 degrees      IHS MUSE NARRATIVE AND IMPRESSION --     SINUS TACHYCARDIA  POSSIBLE LEFT ATRIAL ENLARGEMENT  POSSIBLE ANTERIOR MYOCARDIAL INFARCTION , AGE UNDETERMINED  ABNORMAL ECG  WHEN COMPARED WITH ECG OF 13-Jun-2024 09:57,  NON-SPECIFIC CHANGE IN ST SEGMENT IN INFERIOR LEADS  NONSPECIFIC T WAVE ABNORMALITY NOW EVIDENT IN ANTERIOR LEADS      Narrative:      SINUS TACHYCARDIA  POSSIBLE LEFT ATRIAL ENLARGEMENT  POSSIBLE ANTERIOR MYOCARDIAL INFARCTION , AGE UNDETERMINED  ABNORMAL ECG  WHEN COMPARED WITH ECG OF 13-Jun-2024 09:57,  NON-SPECIFIC CHANGE IN ST SEGMENT IN INFERIOR LEADS  NONSPECIFIC T WAVE ABNORMALITY NOW EVIDENT IN ANTERIOR LEADS            Other ICU Data   Vent Settings:         Invasive ICU Hemodynamics:             Line, Drains,Airway :                        Critical care provider statement:     Critical care time (minutes):  55    I have personally reviewed the patients history and 24 hour interval events, along with vitals, labs, radiology images and nursing.     Critical care time was exclusive of separately billable  procedures and treating other patients and teaching time.   Critical care was time spent personally by me on one or more of the following activities: discussions with consultants, development of treatment plan with patient or surrogate, evaluation of patient's response to treatment, examination of patient, obtaining history from patient or surrogate, ordering and performing treatments and interventions, ordering and review of laboratory studies, ordering and review of radiographic studies, pulse oximetry, re-evaluation of patient's condition and review of old charts.   At least one organ system is acutely impaired.   There is a high probability of imminent, life-threatening deterioration.   I intervened to try to prevent further deterioration of the patient's condition.            Signed by:  Lilla Beals, MD                       [facility]                   MD x [phone]   NP x [phone]            11/12/2024 1:40 PM          Attending Attestation:     I have personally seen and examined the patient and performed the substantive portion of the visit with my medical decision making. I have reviewed medical records, vitals, labs, imaging, and consultation notes within epic and care everywhere. I have independently reviewed today's events and data used in developing today's plan of care. I agree with the documentation, assessment and plan of Daziah Hesler as recorded by Lilla Beals, MD  with additions and exceptions as noted:        Beals Lilla,  MD         rr:[id number], Josette HERO, NP              [1]   Patient Active Problem List  Diagnosis    Primary hypertension    Mixed hyperlipidemia    Gastroesophageal reflux disease    Hiatal hernia    Vitamin D  deficiency    Severe obesity (CMS/HCC)    S/P laparoscopic sleeve gastrectomy    Obesity (BMI 30-39.9)    LUQ abdominal pain    Chronic constipation    Other low back pain    Left leg pain    Radiculopathy, lumbar region    ASCVD (arteriosclerotic  cardiovascular disease)    Acute saddle pulmonary embolism, unspecified whether acute cor pulmonale present (CMS/HCC)   [2]   Past Medical History:  Diagnosis Date    Abnormal vision 10/23/1998    wears glasses    Arthritis     Constipation     HX - HAS BEEN MORE PREVELANT POST BARIATRIC SURGERY.    COVID-19 10/2021    HX - 21//23 URGENT CARE NOTE IN EPIC.    Difficulty walking 05/24/2024    Due to nerve pinched in spine & 2-3-4 lumbar in back spine area    Fibroids     PT HAD A PARTIAL HYSTERECTOMY - OVARIES ARE STILL IN PLACE.    Gastroesophageal reflux disease 10/23/2013    HX.    Hypercholesterolemia 03/23/2010    High Bad Cholesterol Level    Hyperlipidemia 10/23/2016    MANAGED WITH MEDS.    Hypertension 07/17/2024    PT STATES HE BP RUNS FROM 128-130/70s. MANAGED WITH MEDS - FOLLOWED BY PCP.    Left wrist fracture 2018    HX    Low back pain 05/24/2024    Pinched nerve & arthritis    LUQ pain     HX    Pain in joint 05/24/2024    Left knee, thigh and leg    Psoriatic arthritis (CMS/HCC)     HX - LOWER BACK, LEFT HAND, WRIST AND FINGERS    Urinary incontinence 03/23/2017    HX OVERACTIVE BLADDER. ISSUE RESOLVED POST BARIATRIC SURGERY - PT STATED SHE LOST 65 LBS.   [3]   Past Surgical History:  Procedure Laterality Date    BUNIONECTOMY Left     COLONOSCOPY, DIAGNOSTIC (SCREENING)      X 2     COLONOSCOPY, DIAGNOSTIC (SCREENING) N/A 04/13/2021    Procedure: COLONOSCOPY;  Surgeon: Ulysess Foots, MD;  Location: [id number];  Service: Gastroenterology;  Laterality: N/A;    EGD N/A 05/18/2020    Procedure: EGD w/ bx's;  Surgeon: Marcey Sammi BIRCH, MD;  Location: [facility];  Service: Gastroenterology;  Laterality: N/A;    EGD  12/2020    EGD N/A 01/24/2022    Procedure: EGD w/ Bx's;  Surgeon: Marcey Sammi BIRCH, MD;  Location: [facility];  Service: Gastroenterology;  Laterality: N/A;    HYSTERECTOMY  03/23/1997    PARTIAL - OVARIES IN PLACE.    LAPAROSCOPIC GASTRECTOMY SLEEVE WITH HIATAL HERNIA  N/A 07/28/2020    Procedure: LAPAROSCOPIC GASTRECTOMY SLEEVE WITH HIATAL HERNIA, OMENTOPEXY, LAPAROSCOPIC RESECTION OF ANTRAL AND FUNDIC GASTRIC MASS;  Surgeon: Eliberto Oris SAUNDERS, DO;  Location: [facility];  Service: General;  Laterality: N/A;   [4]   Family History  Problem Relation Name Age of Onset    Hypertension Mother Tommy Snell  Mother was in her 30s when she had high blood pressure. now deceased    Arthritis Mother Tommy Snell         Had psoreactic arthristic    Obesity Mother Tommy Snell         Mother was obese w.BMI over 38- 40    Diabetes Mother Tommy Snell 49 - 40    Hypertension Father Todd Shed         Father was in his 85s when diagonsis with high blood pressure. now deceased    Arthritis Father Todd Shed         had psoreactic arthristic    Heart disease Father Todd Shed     Heart failure Father Todd Shed 49 - 40    Hypertension Sister      Hypertension Sister Alfonse Caldron         Had high BPressure in her early 63s now deceased    Hypertension Brother Lynwood Howell in his 33s with HB Pressure    Stroke Brother Lynwood 60 - 69    Hypertension Maternal Grandfather Drue Snell         Not sure of onset age but know he had High BP 60-86 yrs old    Heart disease Paternal Grandmother      Migraines Neg Hx      Seizures Neg Hx     [5]   Social History  Socioeconomic History    Marital status: Divorced   Tobacco Use    Smoking status: Never    Smokeless tobacco: Never    Tobacco comments:     Never smoked or used tobacco   Vaping Use    Vaping status: Never Used   Substance and Sexual Activity    Alcohol use: Yes     Alcohol/week: 0.0 - 1.0 standard drinks of alcohol     Comment: 1-2 DRINKS MONTHLY    Drug use: Never   Other Topics Concern    Dietary supplements / vitamins Yes    Anesthesia problems No    Blood thinners No    Pregnant No    Future Children No    Number of Pregnancies? Yes     Comment: 1    Number of children Yes     Comment: 1    Miscarriages / Abortions?  No    Eats large amounts No    Excessive Sweets Yes    Skips meals No    Eats excessive starches Yes    Snacks or grazes Yes    Emotional eater No    Eats fried food Yes    Eats fast food Yes    Diet Center No    [employer] Yes    [facility] Weight Loss No    Nutri-System Yes    Opti-Fast / Medi-Fast No    Overeaters Anonymous No    [facility] No    TOPS No    Weight Watchers Yes    Atkins No    Binging / Purging No    Calorie Counting Yes    Fasting No    High Protein Yes    Low Carb Yes    Low Fat Yes    [facility] Diet No    Slim Fast No    [facility] Yes    Stationary cycle or treadmill No    Gym/fitness Classes No    Home exercise/video No  Swimming No    Weight training No    Walking or running Yes    Hospitalization No    Hypnosis No    Physical therapy No    Psychological therapy No    Residential program No    Acutrim No    [place] No    Contrave No    Dexatrim No    Diethylpropion No    Fastin  No    Fen - Phen No    Ionamin / Adipex No    Phentermine  No    Qsymia No    Prozac No    Saxenda No    Topamax  No    Wellbutrin No    Xenical (Orlistat, Alli) No    Other Med No    No impairment No    Walks with cane/crutch No    Requires a wheelchair No    Bedridden No    Are you currently being treated for depression? No    Do you snore? Yes    Are you receiving any medical or psychological services? No    Do you ever wake up at night gasping for breath? Yes    Do you have or have you been treated for an eating disorder? No    Anyone ever told you that you stop breathing while asleep? No    Do you exercise regularly? No    Have you or family member ever have trouble with anesthesia? Yes     Social Drivers of Psychologist, [employer] Probation Services Strain: Low Risk (11/11/2024)    Overall Financial Resource Strain (CARDIA)     Difficulty of Paying Living Expenses: Not very hard   Recent Concern: Financial Resource Strain - Medium Risk (08/22/2024)    Overall Financial Resource Strain (CARDIA)     Difficulty of  Paying Living Expenses: Somewhat hard   Food Insecurity: No Food Insecurity (11/10/2024)    Hunger Vital Sign     Worried About Running Out of Food in the Last Year: Never true     Ran Out of Food in the Last Year: Never true   Recent Concern: Food Insecurity - Food Insecurity Present (08/22/2024)    Hunger Vital Sign     Worried About Running Out of Food in the Last Year: Sometimes true     Ran Out of Food in the Last Year: Sometimes true   Transportation Needs: No Transportation Needs (11/10/2024)    PRAPARE - Therapist, Art (Medical): No     Lack of Transportation (Non-Medical): No   Physical Activity: Inactive (08/22/2024)    Exercise Vital Sign     Days of Exercise per Week: 0 days     Minutes of Exercise per Session: 0 min   Stress: Stress Concern Present (08/22/2024)    [employer] of Occupational Health - Occupational Stress Questionnaire     Feeling of Stress : Rather much   Social Connections: Moderately Integrated (12/31/2023)    Social Connection and Isolation Panel     Frequency of Communication with Friends and Family: More than three times a week     Frequency of Social Gatherings with Friends and Family: More than three times a week     Attends Religious Services: More than 4 times per year     Active Member of [employer] or Organizations: Yes     Attends Banker Meetings: More than 4 times per year     Marital  Status: Divorced   Catering Manager Violence: Not At Risk (11/10/2024)    Humiliation, Afraid, Rape, and Kick questionnaire     Fear of Current or Ex-Partner: No     Emotionally Abused: No     Physically Abused: No     Sexually Abused: No   Housing Stability: Not At Risk (11/10/2024)    Housing Stability NCSS     Do you have housing?: Yes     Are you worried about losing your housing?: No   [6] No Known Allergies  [7]   Current Facility-Administered Medications   Medication Dose Route Frequency    apixaban   10 mg Oral Q12H SCH    Followed by    NOREEN ON  11/19/2024] apixaban   5 mg Oral Q12H SCH    atorvastatin   20 mg Oral Daily    [START ON 11/13/2024] pantoprazole   40 mg Oral QAM AC    senna-docusate  1 tablet Oral Q12H SCH   [8]   Medications Prior to Admission   Medication Sig Dispense Refill Last Dose/Taking    acetaminophen  (TYLENOL ) 650 MG CR tablet Take 1 tablet (650 mg) by mouth as needed for Pain   Past Week    atorvastatin  (LIPITOR) 20 MG tablet Take 1 tablet (20 mg) by mouth once daily 90 tablet 3 11/09/2024    diclofenac  Sodium (Voltaren ) 1 % Gel topical gel Apply topically as needed   Past Week    estradiol  (ESTRACE ) 0.1 MG/GM vaginal cream Place 0.5 g vaginally twice a week   Past Month    lisinopril  (ZESTRIL ) 10 MG tablet Take 1 tablet (10 mg) by mouth once daily 90 tablet 3 11/09/2024    Myrbetriq 50 MG Tablet SR 24 hr Take 1 tablet (50 mg) by mouth once daily   11/09/2024    Wegovy  0.25 MG/0.5ML injection Inject 0.5 mLs (0.25 mg) into the skin once a week 4 mL 1 11/04/2024    Inclisiran Sodium (LEQVIO SC) Inject into the skin every 6 (six) months   07/01/2024    [DISCONTINUED] carvedilol  (COREG ) 12.5 MG tablet Take 1 tablet (12.5 mg) by mouth 2 (two) times daily with meals Hold for SBP<169mmHg or heart rate less than 55 bpm 180 tablet 3     [DISCONTINUED] ibuprofen  (ADVIL ) 200 MG tablet Take 1 tablet (200 mg) by mouth as needed for Pain

## 2024-11-12 NOTE — Progress Notes (Signed)
 Case Management Progress Note   [facility]  [facility] INTERMEDIATE CARE UNIT     Patient Name: Barbara Rubio, Barbara Rubio   Date of Birth 1950/12/03   Attending Physician: Alfornia Millet, MD   Primary Care Physician: Lovett Josette HERO, NP   Length of Stay 2   Reason for Consult / Chief Complaint Chest Pain and Shortness of Breath        Hospital Problem List:   Principal Problem:    Acute saddle pulmonary embolism, unspecified whether acute cor pulmonale present (CMS/HCC)        Assessment and Recommendations:                                                              Assessment: 74 yo F admitted w/ chest pain, cute saddle pulmonary embolism, hypoxia, tachypnea. Indep PTA. Lives alone in her apartment @ [address], dtr to drive at time of [place] CM spoke with MD and RN during rounds to discuss options for discharge.  Recommendations: Pending PT/OT     Discharge Plan and Potential Barriers:                                                              Discharge Plan: Plan A: Home ILF ( [facility]) with [facility] PT/OT                              Plan B: SNF                              Transport: Daughter  Potential Barriers: Watch for start of blood thinner     CM Interventions:                                                              CM discussed the above discharge plan with Afzal, Attending MD, and Bedside RN.  All agreed with plan, verbalized understanding and all questions were answered.    CM will continue to follow for discharge planning and needs.               Khadim Lundberg Witherell, BSN, ACM-RN  RN Case Manager II  [facility]    P: [phone]   F: [phone]  Taygen [email or web address]

## 2024-11-12 NOTE — Transition of Care (Signed)
 Patient is transferred from [facility] to [facility] . Vital signs taken and applied. Tele box ordered and verified with tele tech. Fall precautions initiated. Patient educated on how to call for assistance, call bell and personal items are within reach. All transfer orders have been acknowledged.

## 2024-11-12 NOTE — Plan of Care (Signed)
 Problem: Pain interferes with ability to perform ADL  Goal: Pain at adequate level as identified by patient  Outcome: Progressing  Flowsheets (Taken 11/11/2024 2118 by Skeeter Fuelling, RN)  Pain at adequate level as identified by patient:   Identify patient comfort function goal   Assess pain on admission, during daily assessment and/or before any as needed intervention(s)   Reassess pain within 30-60 minutes of any procedure/intervention, per Pain Assessment, Intervention, Reassessment (AIR) Cycle   Evaluate if patient comfort function goal is met     Problem: Side Effects from Pain Analgesia  Goal: Patient will experience minimal side effects of analgesic therapy  Outcome: Progressing  Flowsheets (Taken 11/12/2024 1259)  Patient will experience minimal side effects of analgesic therapy: Monitor/assess patient's respiratory status (RR depth, effort, breath sounds)     Problem: Hemodynamic Status: Cardiac  Goal: Stable vital signs and fluid balance  Outcome: Progressing  Flowsheets (Taken 11/10/2024 1547 by Elnor Crank, RN)  Stable vital signs and fluid balance:   Assess signs and symptoms associated with cardiac rhythm changes   Monitor lab values     Problem: Ineffective Gas Exchange  Goal: Effective breathing pattern  Outcome: Progressing  Flowsheets (Taken 11/10/2024 1547 by Elnor Crank, RN)  Effective breathing pattern: Teach/reinforce use of ordered respiratory interventions (ie. CPAP, BiPAP, Incentive Spirometer, Acapella)     Problem: Inadequate Gas Exchange  Goal: Adequate oxygenation and improved ventilation  Outcome: Progressing  Flowsheets (Taken 11/10/2024 1547 by Elnor Crank, RN)  Adequate oxygenation and improved ventilation:   Assess lung sounds   Monitor SpO2 and treat as needed   Monitor and treat ETCO2   Position for maximum ventilatory efficiency   Provide mechanical and oxygen support to facilitate gas exchange   Teach/reinforce use of incentive spirometer 10 times per hour while awake,  cough and deep breath as needed   Plan activities to conserve energy: plan rest periods   Increase activity as tolerated/progressive mobility   Consult/collaborate with Respiratory Therapy  Goal: Patent Airway maintained  Outcome: Progressing  Flowsheets (Taken 11/12/2024 1259)  Patent airway maintained: Position patient for maximum ventilatory efficiency     Problem: Moderate/High Fall Risk Score >5  Goal: Patient will remain free of falls  Outcome: Progressing  Flowsheets (Taken 11/12/2024 0800)  High (Greater than 13):   HIGH-Visual cue at entrance to patient's room   HIGH-Bed alarm on at all times while patient in bed   HIGH-Utilize chair pad alarm for patient while in the chair   HIGH-Pharmacy to initiate evaluation and intervention per protocol

## 2024-11-12 NOTE — Plan of Care (Addendum)
 [facility] Progress Note    Major Shift Events: Stopped heparin  gtt, started Elliquis, up to chair, on RA O2 >93%. Ambulated hallway tachys up to 120s SBP 150s O2 92% on RA got down to 88% towards end of walk did not c/o SOB. Does intermittently c/o right rib pain      Review of Systems:    Neuro:  AOX4  MAE  PERRLA    Independent at home - family does note less movement recently with sciatica and arthritis that she is getting worked up for possible surgery with ortho.   Walker, cane at baseline    X1ast w/ walker    Cardiac:  NSR-ST 90-103  SBP 130s-140s  +2 radial pulses  +1 pedal pulses   Warm and dry     Respiratory:  RA  Dim LS  RR 25-32  O2 >93%    GI/GU:  LBM: 1/18  Diet: regular   Foley/BRP/BSC/Urinal: bsc    Skin Assessment:  Appropriate for ethnicity    Pain Control:  Intermittently c/o right rib pain    Patient Education:  Elliquis     Plan:   Blood thinners  Transfer

## 2024-11-12 NOTE — Student Progress (Signed)
 CRITICAL CARE  [facility]    [facility]    Critical Care Daily Progress Note      Date Time: 11/12/2024 1:48 PM  Patient Name: Barbara Rubio  Attending Physician: Alfornia Millet, MD  Room: [phone]   Admit Date: 11/10/2024  LOS: 2 days      CC/HPI:   74 year old female with PMH of HTN, HLD, GERD, hiatal hernia, and prior sleeve gastrectomy presents with acute chest pain and shortness of breath. Symptoms began suddenly about 40 minutes prior to arrival. Chest pain is described as tightness and pressure with a burning, indigestion-like sensation.  . Shortness of breath is new and associated with the chest discomfort. She reports a normal cardiac evaluation four months ago. In ER :   CT: .Bilateral acute pulmonary emboli with evidence of high clot burden and right cardiac strain. Recommend interventional radiology consultation to guide further management during admission.  Admitting troponin 147 down to 22 BNP 450,   Patient desatted and was placed on oxygen 4 to 6 L NC and SO2 improved to 97%.  In [facility] patient did very well on heparin  drip.  This morning will transition to oral Eliquis  10 mg twice a day for 7 days then after that 5 mg twice a day.  No provoking factor patient needs to be updated on her cancer surveillance outpatient with her PCP.  Patient will need hypercoagulable workup outpatient after 3 to 6 months completing treatment of anticoagulation.      Subjective/ Last 24 hour Events:   NAONE. This morning patient doing very well. She denies any chest pain or shortness of breath. Pt wants to be OOB more today, and try to walk around the unit.     Discussed pt's echocardiogram findings with PERT team and interventional radiologist.  Agreed no need for intervention continue with medical treatment.    Hospital Course:   1/19: Admitted to the [facility] with saddle PE. Potential for throbmectomy in AM   1/20: pt clinically stable, satting well on 1L NC, vitals all wnl. Thrombectomy  not indicated at this time. Heparin  Gtt continued.   1/21: Switched heparin  to eliquis . Pt stepped down to medical unit.       Assessment:     Problem List[1]      Physical Exam:       Physical Exam  Constitutional:       Appearance: Normal appearance. She is obese.   Cardiovascular:      Rate and Rhythm: Normal rate and regular rhythm.      Pulses: Normal pulses.      Heart sounds: Normal heart sounds.   Pulmonary:      Effort: Pulmonary effort is normal.      Breath sounds: Normal breath sounds.   Abdominal:      General: Abdomen is flat. Bowel sounds are normal.      Palpations: Abdomen is soft.   Musculoskeletal:      Cervical back: Normal range of motion and neck supple.   Skin:     General: Skin is warm and dry.   Neurological:      Mental Status: She is alert. Mental status is at baseline.       Plan:   NEURO  1. No acute neurologic deficit  - Alert and oriented, no focal neurologic symptoms  - Monitor mental status given hypoxia risk     CARDIAC  Right ventricular strain with myocardial injury secondary to PE  - Elevated troponin (  147 ? 22) and BNP 450, trending down and stable  - CT evidence of right heart strain  - Telemetry monitoring  - Trend troponin/BNP  - Transthoracic echocardiogram; EF 68%, elevated RA pressure of 15 mmHg. Moderate pulmonary hypertension with estimated right ventricular systolic pressure of  46 mmHg.  -   Continued heparin  drip until this morning transition to oral Eliquis  10 mg twice a day and then 5 mg twice a day after 7 days.   -  Outpatient follow-up with PCP to make sure she is updated on her cancer surveillance and after completing treatment anticoagulation 3 to 92-month patient will need hypercoagulable workup as well that can be done with PCP or outpatient follow-up with hematologist.   2. Chest pain  - Likely secondary to pulmonary embolism  - Recent cardiac workup (4 months ago) reportedly normal  - Continue cardiac monitoring  3. Hypertension  - On lisinopril  10 mg  daily  - She is normotensive , hold for now,  4. Hyperlipidemia  - On statin therapy  - Continue statin    Ejection Fraction          06/16/2024    10:45 11/10/2024    11:07   Ejection Fraction   Ejection Fraction 65  68      RESPIRATORY  . Acute bilateral pulmonary embolism with high clot burden  - CT chest confirms bilateral PE with RV strain  - IV anticoagulation (heparin ) discontinued changed to oral Eliquis .  - Interventional Radiology consultation done and we updated them about echo results on 1/19 and recommended for heparin  infusion  . Acute hypoxic respiratory failure  - Desaturation requiring 4-6 L NC improved and now is on room air  - Oxygen therapy to maintain SpO? >92%.  We will walk around before she gets transferred to medical floor.     GI  . GERD / hiatal hernia  - Burning chest sensation may be contributory  - Continue acid suppression  - Avoid NSAIDs while anticoagulated  . History of sleeve gastrectomy  - Monitor medication absorption and nutritional status    Last Bowel Movement:   Last BM Date: 11/09/24      ID / HEME  .   Venous thromboembolism; PE+DVT; in setting of relatively sedentary life, Wegovy  there are few case studies Wegovy  causing DVT and PE.  For now we recommend to hold Wegovy .  -- Anticipate long-term anticoagulation  -Follow hematology as outpatient to do full workup  - On Eliquis , discontinued heparin  on 1/21    GU  . Nocturia   - No dysuria or infection symptoms  - Monitor intake/output and renal function     # MSK  -Sciatica, knee arthritis    Renal/Fluid, Electrolytes:  - replete as needed      Intake/Output Summary (Last 24 hours) at 11/12/2024 1348  Last data filed at 11/12/2024 0952  Gross per 24 hour   Intake 1291.8 ml   Output 1300 ml   Net -8.2 ml   Endo:   Goal glucose level 140-180.                                       GENERAL CRITICAL CARE ASSESSMENT/ PLANS- IF APPLICABLE    Lines/drains/airways:    GI Prophylaxis:   VTE Prophylaxis: apixaban  (ELIQUIS ) tablet 10 mg    apixaban  (ELIQUIS ) tablet 5 mg   Nutrition:  Sedation:  Foley Catheter:       PT/OT: Current PT Order: Yes  Current OT Order: Yes  Code Status:   Full Code  Disposition:     Next of Kin Updated: Open ACP Navigator   Primary Emergency Contact: Fritsche-Dickerson,Alicia, Home Phone: [phone]  Yes              Critical care provider statement:     Critical care time (minutes):      I have personally reviewed the patients history and 24 hour interval events, along with vitals, labs, radiology images and nursing.     Critical care time was exclusive of separately billable procedures and treating other patients and teaching time.   Critical care was time spent personally by me on one or more of the following activities: discussions with consultants, development of treatment plan with patient or surrogate, evaluation of patient's response to treatment, examination of patient, obtaining history from patient or surrogate, ordering and performing treatments and interventions, ordering and review of laboratory studies, ordering and review of radiographic studies, pulse oximetry, re-evaluation of patient's condition and review of old charts.   At least one organ system is acutely impaired.   There is a high probability of imminent, life-threatening deterioration.   I intervened to try to prevent further deterioration of the patient's condition.        Signed by:  Aleck FORBES Clause, BS                       [facility]              11/12/2024 1:48 PM             Medical Intensive Care Unit           Ext. [phone]          Intermediate Care Unit                       MD Ext: [phone]          NP Ext. [phone]           8p-8a provider Coverage Ex [phone]    Attending Attestation:   I have personally seen and examined the patient and performed the substantive portion of the visit with my medical decision making. I have reviewed medical records, vitals, labs, imaging, and consultation notes within epic and care everywhere. I  have independently reviewed today's events and data used in developing today's plan of care. I agree with the documentation, assessment and plan of Tameca Cartier Mapel as recorded by Aleck FORBES Clause, BS  with additions and exceptions as noted:        Alfornia Millet, MD                            [1]   Patient Active Problem List  Diagnosis    Primary hypertension    Mixed hyperlipidemia    Gastroesophageal reflux disease    Hiatal hernia    Vitamin D  deficiency    Severe obesity (CMS/HCC)    S/P laparoscopic sleeve gastrectomy    Obesity (BMI 30-39.9)    LUQ abdominal pain    Chronic constipation    Other low back pain    Left leg pain    Radiculopathy, lumbar region    ASCVD (arteriosclerotic cardiovascular disease)    Acute saddle pulmonary embolism, unspecified whether acute cor  pulmonale present (CMS/HCC)

## 2024-11-13 ENCOUNTER — Other Ambulatory Visit: Payer: Self-pay

## 2024-11-13 ENCOUNTER — Inpatient Hospital Stay: Admitting: Rehabilitative and Restorative Service Providers"

## 2024-11-13 DIAGNOSIS — I10 Essential (primary) hypertension: Secondary | ICD-10-CM

## 2024-11-13 DIAGNOSIS — K219 Gastro-esophageal reflux disease without esophagitis: Secondary | ICD-10-CM

## 2024-11-13 DIAGNOSIS — E669 Obesity, unspecified: Secondary | ICD-10-CM

## 2024-11-13 LAB — BASIC METABOLIC PANEL
Anion Gap: 8 (ref 5.0–15.0)
BUN: 9 mg/dL (ref 7–21)
CO2: 23 meq/L (ref 17–29)
Calcium: 8.4 mg/dL (ref 7.9–10.2)
Chloride: 108 meq/L (ref 99–111)
Creatinine: 0.8 mg/dL (ref 0.4–1.0)
GFR: >60 mL/min/1.73 m2 (ref >=60.0)
Glucose: 120 mg/dL — ABNORMAL HIGH (ref 70–100)
Potassium: 3.9 meq/L (ref 3.5–5.3)
Sodium: 139 meq/L (ref 135–145)

## 2024-11-13 LAB — PHOSPHORUS: Phosphorus: 3.5 mg/dL (ref 2.3–4.7)

## 2024-11-13 LAB — MAGNESIUM: Magnesium: 1.9 mg/dL (ref 1.6–2.6)

## 2024-11-13 MED ORDER — APIXABAN 5 MG PO TABS
ORAL_TABLET | ORAL | 0 refills | Status: AC
  Filled 2024-11-13: qty 72, 30d supply, fill #0

## 2024-11-13 NOTE — Discharge Summary (Signed)
 [facility]  Internal Medicine Hospitalists  Discharge Summary          Date of Admission: 11/10/2024  Date of Discharge: 11/13/24 17:14    Discharge Diagnoses:   Acute pulmonary embolism with acute DVT with cor pulmonale but on room air now.  PERT team was contacted also discussed with interventional radiologist multiple times secondary to her clinical stability no intervention recommended recommended heparin  drip and continuing supportive care.  Patient was in [facility] and then transferred to medical service today patient is doing fine walked around on room air.  Seen by pulmonologist cleared for discharge with outpatient follow-up.  Will discharge on Eliquis  10 mg twice a day for 7 days of total and then after that 5 mg twice a day.  Outpatient follow-up with pulmonologist in couple of weeks and then further recommendation and treatment management workup for hereditary hypercoagulable state outpatient with pulmonologist after completing anticoagulation treatment for few months.  We recommend to hold Wegovy  for now discussed with her PCP may be changing it to different medication as there are some case reports with Wegovy  causing clots.  Acute hypoxic respiratory failure resolved treated.  Right ventricular strain secondary to pulm embolism echocardiogram done patient on room air continue anticoagulation no need for intervention.  History of GERD stable.  History of sciatica arthritis outpatient follow-up with PCP.  Class I obesity patient is on Wegovy .  Recommend to hold Wegovy  for now for blood clot.    Additional Diagnoses:              Hospital Course:      Reason for Admission:       Hospital Course:         Discharge Day Exam:   Temp:  [97 F (36.1 C)-98.6 F (37 C)] 97.5 F (36.4 C)  Heart Rate:  [90-105] 90  Resp Rate:  [16-23] 16  BP: (122-150)/(66-91) 148/80  General: awake, alert, oriented x 3; no acute distress  HEENT: anicteric sclerae, PERRL, EOMI; MMM  Cardiovascular: regular rate and  rhythm; no murmurs, rubs, or gallops  Lungs: clear to auscultation bilaterally without wheezing, rhonchi, or rales  Abdomen: soft, non-distended; non-tender to palpation, no rebound or guarding  Extremities: warm; no LE edema; no clubbing or cyanosis  Neuro: symmetric facial movements, clear speech, moving all extremities        Pertinent Labs:   CBC:   Recent Labs   Lab 11/12/24  0512 11/11/24  0408 11/10/24  0429   WBC 7.70 7.31 7.68   Hemoglobin 11.1* 11.3* 12.7   Hematocrit 35.6 35.4 41.0   Platelet Count 169 145 172   MCV 85.6 86.6 88.2           Radiology and Procedures:   Radiology: all results from this admission  Echo Adult TTE Complete  Result Date: 11/10/2024  Date: 11/10/2024  Left ventricular systolic function is normal with an ejection fraction by Biplane Method of Discs of  68 %. Left ventricular segmental wall motion is normal. Left ventricular diastolic function could not be accurately assessed due to tachycardia. Mildly decreased right ventricular systolic function. There is mild tricuspid regurgitation. 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 Moderate pulmonary hypertension with estimated right ventricular systolic pressure of  46 mmHg. Compared to the prior study, estimated pulmonary pressures are elevated with decreased RV systolic function and dilated IVC. Findings reported to primary team, Lenward Buys, PA at  12:08 pm on 11/11/23.      US  Venous Low Extrem Duplx Dopp Comp Bilat  Result Date: 11/10/2024  1.Occlusive thrombosis of the right distal femoral vein, popliteal vein, posterior tibial veins and one of the peroneal veins. 2.No sonographic evidence for left lower extremity deep venous thrombosis. Artie Russella Estrin, MD 11/10/2024 10:34 AM    CT Angio Chest (PE study)  Result Date: 11/10/2024   1.Bilateral acute pulmonary emboli with evidence of high clot burden and right cardiac strain. Recommend interventional  radiology consultation to guide further management. 2. No evidence of pulmonary infarction or pleural effusion. 3. The lungs are clear except for minimal left basilar atelectasis. 4. Critical findings were discussed with, and read back by, Dr. Laymon Rocher at the following time 11/10/2024 6:22 AM. [place] Door, MD 11/10/2024 6:31 AM    Chest AP Portable  Result Date: 11/10/2024   No evidence of pneumonia. The lungs are well-expanded and grossly clear. [place] Door, MD 11/10/2024 5:21 AM     Discharge Medications and Documented Allergies:        Discharge Medication List        Taking      acetaminophen  650 MG CR tablet  Dose: 650 mg  Commonly known as: TYLENOL   Take 1 tablet (650 mg) by mouth as needed for Pain     apixaban  5 MG tablet  Commonly known as: ELIQUIS   Start taking on: November 13, 2024  Take 2 tablets (10 mg) by mouth every 12 (twelve) hours for 6 days, THEN 1 tablet (5 mg) every 12 (twelve) hours for 24 days.     atorvastatin  20 MG tablet  Dose: 20 mg  Commonly known as: LIPITOR  Take 1 tablet (20 mg) by mouth once daily     estradiol  0.1 MG/GM vaginal cream  Dose: 0.5 g  Commonly known as: ESTRACE   Place 0.5 g vaginally twice a week     LEQVIO SC  Inject into the skin every 6 (six) months     lisinopril  10 MG tablet  Dose: 10 mg  Commonly known as: ZESTRIL   Take 1 tablet (10 mg) by mouth once daily     Myrbetriq 50 MG Tb24  Dose: 50 mg  Generic drug: Mirabegron ER  Take 1 tablet (50 mg) by mouth once daily     Voltaren  1 % Gel topical gel  Generic drug: diclofenac  Sodium  Apply topically as needed            STOP taking these medications      Wegovy  0.25 MG/0.5ML injection  Generic drug: semaglutide               Allergies[1]         Disposition:     Discharge Disposition: Home with family    Discharge Code Status: Full Code    Patient Emergency Contact:  Extended Emergency Contact Information  Primary Emergency ContactAntanette, Richwine  Address: [address]           [place] , [place] [address] [place]  Home Phone: [phone]  Work Phone: [phone]  Mobile Phone: [phone]  Relation: Daughter    Discharge Instructions:     Patient Instructions: (See AVS for full details)  Reason for your Hospital Admission:  Acute lower extremity DVT.  Acute pulmonary embolism.      Instructions for after your discharge:  Continue Eliquis  10 mg twice a day for total of 7 days and then after that 5 mg  twice a day.  Follow-up with pulmonologist in 2 weeks for further recommended treatment management and workup  Stop taking Wegovy  for now.  Most Recent Diet Order:  Orders Placed This Encounter   Procedures    Adult diet Regular       Activity/Weight Bearing Status: Activity as tolerated    Wound Care:       Outpatient Follow-Up Plan:     Instructions for PCP:  Reason for your Hospital Admission:  Acute lower extremity DVT.  Acute pulmonary embolism.      Instructions for after your discharge:  Continue Eliquis  10 mg twice a day for total of 7 days and then after that 5 mg twice a day.  Follow-up with pulmonologist in 2 weeks for further recommended treatment management and workup  Stop taking Wegovy  for now.  Appointments:   Follow-up Information       Fernand Arlyn DEL, MD Follow up in 1 week(s).    Specialties: Pulmonary Disease, Critical Care Medicine  Why: PE  Contact information:  [address]  [place] [address]  [phone]               Lovett Josette HERO, NP .    Specialty: Family Nurse Practitioner  Contact information:  [phone]  [address]  [place] [address]  [phone]                             Pending Labs, Microbiology, and Pathology:  Unresulted Labs       None            Attestations and Signatures:     Minutes spent coordinating discharge and reviewing discharge plan: 35 minutes                  [1] No Known Allergies

## 2024-11-13 NOTE — OT Eval Note (Addendum)
 Occupational Therapy Evaluation  Patient: Barbara Rubio    [id number]  Discharge Recommendations:   Based on today's session: Home with supervision     DME needs if pt returns home: DME Recommended for Discharge: [facility]      Assessment:   Barbara Rubio is a 74 y.o. female admitted 11/10/2024. Pt's presents with:  Assessment: decreased strength;decreased independence with ADLs;decreased independence with IADLs;balance deficits. Pt would continue to benefit from OT to address these deficits and increase functional independence.      Therapy Diagnosis: generalized weakness, decreased functional mobility , decreased independence with ADL's, and decreased endurance/ activity engagement due to PE, SOB, and fatigue. Without therapy interventions, patient is at risk for falls, dependence on caregivers for mobility, dependence on caregivers for ADL's, decreased independence, and decreased quality of life.    Rehabilitation Potential: Prognosis: Good;With continued OT s/p acute discharge    Plan:   OT Frequency Recommended: (P) one time visit - therapy discontinued (Simultaneous filing. User may not have seen previous data.)   Treatment Interventions: ADL retraining;UE strengthening/ROM;Endurance training;Patient/Family training;Equipment eval/education;Compensatory technique education     Patient Goal  Patient Goal: Wants to go home    Risks/Benefits/POC Discussed with Pt/Family: With patient/family    Goals:   N/A                        Unit: [facility] [address] [facility]  Bed: [phone].[id number]      OT Received On: 11/13/24  Start Time: 0914  Stop Time: 0948  Time Calculation (min): 34 min  OT Visit Number: 1    Consult received for Barbara Rubio for OT Evaluation and Treatment.  Patients medical condition is appropriate for Occupational therapy intervention at this time.    Precautions and Contraindications:   Precautions  Other Precautions: Fall, bleeding      Medical  Diagnosis: Tachypnea [R06.82]  Hypoxia [R09.02]  Chest pain, unspecified type [R07.9]  Acute saddle pulmonary embolism, unspecified whether acute cor pulmonale present (CMS/HCC) [I26.92]    History of Present Illness: Barbara Rubio is a 74 y.o. female admitted on 11/10/2024 with PE and SOB.     Problem List[1]     Past Medical/Surgical History:  Medical History[2]   Past Surgical History[3]      X-Rays/Tests/Labs:  Echo Adult TTE Complete  Result Date: 11/10/2024  Date: 11/10/2024  Left ventricular systolic function is normal with an ejection fraction by Biplane Method of Discs of  68 %. Left ventricular segmental wall motion is normal. Left ventricular diastolic function could not be accurately assessed due to tachycardia. Mildly decreased right ventricular systolic function. There is mild tricuspid regurgitation. 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 Moderate pulmonary hypertension with estimated right ventricular systolic pressure of  46 mmHg. Compared to the prior study, estimated pulmonary pressures are elevated with decreased RV systolic function and dilated IVC. Findings reported to primary team, Lenward Buys, PA at 12:08 pm on 11/11/23.      US  Venous Low Extrem Duplx Dopp Comp Bilat  Result Date: 11/10/2024  1.Occlusive thrombosis of the right distal femoral vein, popliteal vein, posterior tibial veins and one of the peroneal veins. 2.No sonographic evidence for left lower extremity deep venous thrombosis. Barbara Russella Estrin, MD 11/10/2024 10:34 AM    CT Angio Chest (PE study)  Result Date: 11/10/2024   1.Bilateral acute pulmonary emboli with evidence  of high clot burden and right cardiac strain. Recommend interventional radiology consultation to guide further management. 2. No evidence of pulmonary infarction or pleural effusion. 3. The lungs are clear except for minimal left basilar atelectasis. 4. Critical findings were  discussed with, and read back by, Dr. Laymon Rocher at the following time 11/10/2024 6:22 AM. Austin Door, MD 11/10/2024 6:31 AM    Chest AP Portable  Result Date: 11/10/2024   No evidence of pneumonia. The lungs are well-expanded and grossly clear. 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, [employer], [place], [place] (use the cane out in the community and Occupational Hygienist in the house)  Home Living Arrangements  Living Arrangements: Alone  Type of Home: Apartment Air Cabin Crew)  Home Layout: One level (FFSU)  Bathroom Shower/Tub: Pension Scheme Manager:  (tub/shower restroom has a RTS the walk in shower BR has a standard toilet)  Bathroom Accessibility: Accessible  DME Currently at Home: [employer], [employer], [place], [place] (use the cane out in the community and Rollator in the house)      Subjective:   Patient is agreeable to participation in the therapy session. Family and/or guardian are agreeable to patient's participation in the therapy session. Nursing clears patient for therapy.  Subjective:  I live on the first floor.  Pain Assessment  Pain Assessment: No/denies pain.        Objective:   Observation of Patient/Vital Signs:  Patient is in bed with telemetry , cont pulse ox in place.     Sp02 at baseline 95% on RA  Sp02 after activity 92%, however quickly recovered to 95%     Cognition/Neuro Status  Arousal/Alertness: Appropriate responses to stimuli  Attention Span: Appears intact  Orientation Level: Oriented X4  Memory: Appears intact  Following Commands: independent  Safety Awareness: independent  Insights: Fully aware of deficits  Problem Solving: Able to problem solve independently  Behavior:  attentive;calm;cooperative  Motor Planning: intact  Coordination: intact  Hand Dominance: right handed    Gross ROM  Right Upper Extremity ROM: within functional limits  Left Upper Extremity ROM: within functional limits  Gross Strength  Right Upper Extremity Strength: 5/5 (shoulder abduction 4+/5)  Left Upper Extremity Strength: 5/5 (shoulder abduction 4+/5)  *MMT scale per [employer] Grading System          Sensory  Auditory: impaired left (still WFL)  Tactile - Light Touch: intact  Visual Acuity: intact;wears glasses       Self-care and Home Management  LB Dressing: Independent;Don/doff R sock;Don/doff L sock  Toileting: Independent;perineal hygiene  Functional Transfers: Supervision (RW)    Mobility and Transfers  Supine to Sit: Independent  Sit to Stand: Independent  Bed to Chair: Stand by Assist;Supervision  Functional Mobility/Ambulation: Supervision (pt walked from bed > toilet > bed using RW)     Balance  Static Sitting Balance: good  Dyanamic Sitting Balance: good  Static Standing Balance: good  Dynamic Standing Balance: good    Participation and Endurance  Participation Effort: good  Endurance: Tolerates 30 min exercise with multiple rests    AM-PAC 6 Clicks Daily Activity Inpatient Short Form  Inpatient AM-PAC Performed?: yes  Put On/Take Off  Lower Body Clothing: None  Assist with Bathing: A little  Assist with Toileting: None  Put On/Take Off Upper Body Clothing: None  Assist with Grooming: None  Assist with Eating: None  OT Daily Activity Raw Score: 23  CMS 0-100% Score: 15.86%    PMP - Progressive Mobility Protocol   PMP Activity: Step 6 - Walks in Room (6)    Treatment Activities:     Educated the patient to role of occupational therapy, plan of care, goals of therapy and safety with mobility and ADLs, energy conservation techniques, home safety.Pt was educated and provided with handouts on the 4 P's of energy conservation and conserving energy when performing ADL and IADL tasks. Pt also  educated on performing walks in hallways with RN assist several times a day and sitting up for all meals.     Patient was left sitting in bedside chair with call bell, tray table, and phone with her daughter present in the room.     Therapist PPE during session procedural mask and gloves    Today's session was completed under the direct supervision of the primary therapist. Primary therapist was physically present and immediately available for the entire session and is fully responsible for the therapy tasks and activities being performed. Primary therapist is actively involved in the clinical assessment of the patient. Documentation and plan of care has been reviewed and approved by the primary therapist.              [1]   Patient Active Problem List  Diagnosis    Primary hypertension    Mixed hyperlipidemia    Gastroesophageal reflux disease    Hiatal hernia    Vitamin D  deficiency    Severe obesity (CMS/HCC)    S/P laparoscopic sleeve gastrectomy    Obesity (BMI 30-39.9)    LUQ abdominal pain    Chronic constipation    Other low back pain    Left leg pain    Radiculopathy, lumbar region    ASCVD (arteriosclerotic cardiovascular disease)    Acute saddle pulmonary embolism, unspecified whether acute cor pulmonale present (CMS/HCC)   [2]   Past Medical History:  Diagnosis Date    Abnormal vision 10/23/1998    wears glasses    Arthritis     Constipation     HX - HAS BEEN MORE PREVELANT POST BARIATRIC SURGERY.    COVID-19 10/2021    HX - 21//23 URGENT CARE NOTE IN EPIC.    Difficulty walking 05/24/2024    Due to nerve pinched in spine & 2-3-4 lumbar in back spine area    Fibroids     PT HAD A PARTIAL HYSTERECTOMY - OVARIES ARE STILL IN PLACE.    Gastroesophageal reflux disease 10/23/2013    HX.    Hypercholesterolemia 03/23/2010    High Bad Cholesterol Level    Hyperlipidemia 10/23/2016    MANAGED WITH MEDS.    Hypertension 07/17/2024    PT STATES HE BP RUNS FROM 128-130/70s. MANAGED WITH MEDS - FOLLOWED BY PCP.     Left wrist fracture 2018    HX    Low back pain 05/24/2024    Pinched nerve & arthritis    LUQ pain     HX    Pain in joint 05/24/2024    Left knee, thigh and leg    Psoriatic arthritis (CMS/HCC)     HX - LOWER BACK, LEFT HAND, WRIST AND FINGERS    Urinary incontinence 03/23/2017    HX  OVERACTIVE BLADDER. ISSUE RESOLVED POST BARIATRIC SURGERY - PT STATED SHE LOST 65 LBS.   [3]   Past Surgical History:  Procedure Laterality Date    BUNIONECTOMY Left     COLONOSCOPY, DIAGNOSTIC (SCREENING)      X 2     COLONOSCOPY, DIAGNOSTIC (SCREENING) N/A 04/13/2021    Procedure: COLONOSCOPY;  Surgeon: Ulysess Foots, MD;  Location: [id number];  Service: Gastroenterology;  Laterality: N/A;    EGD N/A 05/18/2020    Procedure: EGD w/ bx's;  Surgeon: Marcey Sammi BIRCH, MD;  Location: [facility];  Service: Gastroenterology;  Laterality: N/A;    EGD  12/2020    EGD N/A 01/24/2022    Procedure: EGD w/ Bx's;  Surgeon: Marcey Sammi BIRCH, MD;  Location: [facility];  Service: Gastroenterology;  Laterality: N/A;    HYSTERECTOMY  03/23/1997    PARTIAL - OVARIES IN PLACE.    LAPAROSCOPIC GASTRECTOMY SLEEVE WITH HIATAL HERNIA N/A 07/28/2020    Procedure: LAPAROSCOPIC GASTRECTOMY SLEEVE WITH HIATAL HERNIA, OMENTOPEXY, LAPAROSCOPIC RESECTION OF ANTRAL AND FUNDIC GASTRIC MASS;  Surgeon: Eliberto Oris SAUNDERS, DO;  Location: [facility];  Service: General;  Laterality: N/A;

## 2024-11-13 NOTE — Progress Notes (Signed)
 VIRTUAL NURSE DISCHARGE NOTE:     Education: all education listed on AVS reviewed with patient and daughter     New discharge medications?: all medications  listed on AVS reviewed with patient and daughter     Family present during D/C teaching?: YES     Follow up appointments: follow up appointments  listed on AVS reviewed with patient and daughter     Ride: patient's daughter     Belongings: no belongings w security or pharmacy. Deferred to PN.

## 2024-11-13 NOTE — Consults (Signed)
 Pulmonary Consult Note    Date Time: 11/13/24 5:42 PM  Patient Name: Barbara Rubio  Consulting Physician: Alfornia Millet, MD          HPI:      Reason for Consult: PE     Ms. Clendenin is a 74 YOF with a past medical history of GERD, Sciatica, Obesity ( on Wegovy  ). She presented to [facility] with acute onset chest pain and dyspnea.     CTA ( 11/10/24 )   1.Bilateral acute pulmonary emboli with evidence of high clot burden and  right cardiac strain.    2. No evidence of pulmonary infarction or pleural effusion.  3. The lungs are clear except for minimal left basilar atelectasis.    BLE Dopplers ( 11/10/24 )   1.Occlusive thrombosis of the right distal femoral vein, popliteal vein,  posterior tibial veins and one of the peroneal veins.     ECHO ( 11/10/24 ) EF 68%, moderate PHTN RVSP 46    No known personal or family hx of VTE. Last travel episode was to [facility] around Thanksgiving. No recent surgery or trauma. No recent COVID infection. She does use Estradiol  suppository cream. She was treated in the [facility] on Heparin  Drip and has now been down graded to the floors. She is on RA. Heparin  has been changed to Eliquis .     Subjective:   Patient seen and examined. Imaging and Labs reviewed.       Assessment:     PE  DVT ( I82.4 )   Pulmonary Hypertension ( I27.20 )   GERD ( K21.9 )     Plan:     Patient presents with acute high clot burden PE with RV strain/moderate PHTN/DVT  She is on Estradiol  cream and Wegovy  -     Downgraded from [facility] to the floors     Now on RA hemodynamically stable   Heparin  transitioned to Eliquis  with a loading dose   Continue Eliquis  with a loading dose   Duration to be determined     She will need follow up CTA/Dopplers/ECHO in 6-8 weeks     Will arrange outpatient follow up in 1-2 weeks     #Prophylaxis:    VTE Prophylaxis: Eliquis        Discussed with patient       Thank you for the consult!      Review of Systems:   General ROS: negative, no weight loss/gain, no fever, no chills, no rigor   ENT  ROS: negative   Endocrine ROS: negative, no fatigue, no polydipsia   Respiratory ROS: no cough, + shortness of breath   Cardiovascular ROS: +chest pain, + dyspnea on exertion   Gastrointestinal ROS: no abdominal pain, change in bowel habits, or black or bloody stools   Genito-Urinary ROS: no dysuria, trouble voiding, or hematuria   Musculoskeletal ROS: negative   Neurological ROS: no encephalopathy   Dermatological ROS: negative, no rash, no ulcer  Psych:    Cooperative       Medical History[1]   Social History     Substance and Sexual Activity   Alcohol Use Yes    Alcohol/week: 0.0 - 1.0 standard drinks of alcohol    Comment: 1-2 DRINKS MONTHLY      Tobacco Use History[2]   Social History     Substance and Sexual Activity   Drug Use Never     Family History[3]       Physical Exam:   BP 148/80  Pulse 90   Temp 97.5 F (36.4 C) (Temporal)   Resp 16   Ht 1.727 m (5' 8)   Wt 89.2 kg (196 lb 10.4 oz)   SpO2 93%   BMI 29.90 kg/m     General appearance - alert, comfortable appearing, and in no acute distress   Mental status - alert, oriented to person, place, and time  Eyes -  extraocular eye movements intact, sclera anicteric  Ears - generally normal looking, no erythema  Nose - normal and patent, no erythema, no discharge  Mouth - mucous membranes moist   Neck - supple, full range of motion   Chest - clear to auscultation, no wheezes, rales or rhonchi, symmetric air entry  Heart - S1 and S2 normal, P2 not loud , No RV heave  Abdomen - soft, nontender, nondistended   Neurological - alert, oriented,  no focal findings or movement disorder noted, neck supple without rigidity, detail exam not done  Extremities - no pedal edema, no clubbing or cyanosis      Allergies:  No Known Allergies         Meds:      Scheduled Meds: PRN Meds:    apixaban , 10 mg, Oral, Q12H SCH   Followed by  NOREEN ON 11/19/2024] apixaban , 5 mg, Oral, Q12H SCH  atorvastatin , 20 mg, Oral, Daily  pantoprazole , 40 mg, Oral, QAM  AC  senna-docusate, 1 tablet, Oral, Q12H SCH          Continuous Infusions:   acetaminophen , 1,000 mg, Q6H PRN  benzocaine -menthol , 1 lozenge, Q1H PRN  magnesium  sulfate, 1 g, PRN  potassium chloride , 0-60 mEq, PRN   Or  potassium chloride , 0-60 mEq, PRN   Or  potassium chloride , 10 mEq, PRN  sodium phosphates  15 mmol in dextrose  5 % 250 mL IVPB, 15 mmol, PRN  sodium phosphates  25 mmol in dextrose  5 % 250 mL IVPB, 25 mmol, PRN  sodium phosphates  35 mmol in dextrose  5 % 250 mL IVPB, 35 mmol, PRN          I personally reviewed all of the medications      Labs:     Recent Labs   Lab 11/13/24  0842 11/11/24  0408 11/10/24  0429   Glucose 120*  More results in Results Review 141*   BUN 9  More results in Results Review 7   Creatinine 0.8  More results in Results Review 0.8   Calcium  8.4  More results in Results Review 9.2   Sodium 139  More results in Results Review 139   Potassium 3.9  More results in Results Review 3.5   Chloride 108  More results in Results Review 105   CO2 23  More results in Results Review 25   Albumin  --   --  3.3*   Phosphorus 3.5  More results in Results Review  --    Magnesium  1.9  More results in Results Review  --    GFR >60.0  More results in Results Review >60.0   PTT  --   --  27   More results in Results Review = values in this interval not displayed.       Recent Labs   Lab 11/10/24  0429   AST (SGOT) 18   ALT 11   Alkaline Phosphatase 75   Albumin 3.3*   Bilirubin, Total 0.7       Recent Labs   Lab 11/12/24  0512   WBC  7.70   Hemoglobin 11.1*   Hematocrit 35.6   MCV 85.6   MCH 26.7   MCHC 31.2*   RDW 15   MPV 11.7   Platelet Count 169             Invalid input(s): FREET4          No results found.       Case discussed with: Team    Arlyn VEAR Bathe, MD   11/13/24 5:42 PM      *Due to pandemic of coronavirus and based on hospital policies and procedures ; this document may contain, but not limited to, information based on direct patient contact, observation , telephone conversation, use of  A/V devices , medical records, conversation with other treatment team members, information and physical exam by other treatment team members.  Not all errors are caught or corrected. If there are questions or concerns about the content of this note or information contained within the body of this document they should be addressed directly with the author for clarification.        *This note was generated by the Epic EMR system/ Dragon speech recognition and may contain inherent errors or omissions not intended by the user. Grammatical errors, random word insertions, deletions, pronoun errors and incomplete sentences are occasional consequences of this technology due to software limitations. Not all errors are caught or corrected. If there are questions or concerns about the content of this note or information contained within the body of this dictation they should be addressed directly with the author for clarification.                            [1]   Past Medical History:  Diagnosis Date    Abnormal vision 10/23/1998    wears glasses    Arthritis     Constipation     HX - HAS BEEN MORE PREVELANT POST BARIATRIC SURGERY.    COVID-19 10/2021    HX - 21//23 URGENT CARE NOTE IN EPIC.    Difficulty walking 05/24/2024    Due to nerve pinched in spine & 2-3-4 lumbar in back spine area    Fibroids     PT HAD A PARTIAL HYSTERECTOMY - OVARIES ARE STILL IN PLACE.    Gastroesophageal reflux disease 10/23/2013    HX.    Hypercholesterolemia 03/23/2010    High Bad Cholesterol Level    Hyperlipidemia 10/23/2016    MANAGED WITH MEDS.    Hypertension 07/17/2024    PT STATES HE BP RUNS FROM 128-130/70s. MANAGED WITH MEDS - FOLLOWED BY PCP.    Left wrist fracture 2018    HX    Low back pain 05/24/2024    Pinched nerve & arthritis    LUQ pain     HX    Pain in joint 05/24/2024    Left knee, thigh and leg    Psoriatic arthritis (CMS/HCC)     HX - LOWER BACK, LEFT HAND, WRIST AND FINGERS    Urinary incontinence 03/23/2017    HX  OVERACTIVE BLADDER. ISSUE RESOLVED POST BARIATRIC SURGERY - PT STATED SHE LOST 65 LBS.   [2]   Social History  Tobacco Use   Smoking Status Never   Smokeless Tobacco Never   Tobacco Comments    Never smoked or used tobacco   [3]   Family History  Problem Relation Name Age of Onset    Hypertension Mother Tommy Snell  Mother was in her 30s when she had high blood pressure. now deceased    Arthritis Mother Tommy Snell         Had psoreactic arthristic    Obesity Mother Tommy Snell         Mother was obese w.BMI over 38- 40    Diabetes Mother Tommy Snell 49 - 40    Hypertension Father Todd Shed         Father was in his 32s when diagonsis with high blood pressure. now deceased    Arthritis Father Todd Shed         had psoreactic arthristic    Heart disease Father Todd Shed     Heart failure Father Todd Shed 49 - 40    Hypertension Sister      Hypertension Sister Alfonse Caldron         Had high BPressure in her early 31s now deceased    Hypertension Brother Lynwood Howell in his 17s with HB Pressure    Stroke Brother Lynwood 39 - 30    Hypertension Maternal Grandfather Drue Snell         Not sure of onset age but know he had High BP 60-86 yrs old    Heart disease Paternal Grandmother      Migraines Neg Hx      Seizures Neg Hx

## 2024-11-13 NOTE — PT Eval Note (Signed)
 Physical Therapy Evaluation  Patient: [name]    [id number]  Discharge Recommendations:   Based on today's session: Discharge Recommendation: Home with home health PT;Home with supervision     DME needs if pt returns home: DME Recommended for Discharge: No additional equipment/DME recommended at this time;Patient already has needed equipment      Assessment:   Barbara Rubio is a 74 y.o. female admitted 11/10/2024. Pt's presents with:  Assessment: Decreased endurance/activity tolerance;Decreased LE strength. No further acute PT required. Will d/c.    Therapy Diagnosis: generalized weakness, increased gait dysfunction, and decreased endurance/ activity engagement due to PE and DVT. Without therapy interventions, patient is at risk for falls and decreased independence.    Rehabilitation Potential: Prognosis: Good;With continued PT status post acute discharge      Plan:    Treatment/Interventions: No skilled interventions needed at this time PT Frequency: one time visit - therapy discontinued    Risks/Benefits/POC Discussed with Pt/Family: With patient/family          Goals:           [facility] [address] [facility] PROGRESSIVE CARE [phone]    PT Received On: 11/13/24  Start Time: 1340  Stop Time: 1403  Time Calculation (min): 23 min    PT Visit Number: 1    Consult received for Barbara Rubio for PT Evaluation and Treatment.  Patients medical condition is appropriate for Physical therapy intervention at this time.    Precautions and Contraindications:   Precautions  Weight Bearing Status: no restrictions  Fall Risks: Low  Other Precautions: Fall, bleeding    Medical Diagnosis: Tachypnea [R06.82]  Hypoxia [R09.02]  Chest pain, unspecified type [R07.9]  Acute saddle pulmonary embolism, unspecified whether acute cor pulmonale present (CMS/HCC) [I26.92]    History of Present Illness: Barbara Rubio is a 74 y.o. female admitted on 11/10/2024 with PMH of HTN, HLD, GERD, hiatal  hernia, and prior sleeve gastrectomy presents with acute chest pain and shortness of breath. Symptoms began suddenly about 40 minutes prior to arrival. Chest pain is described as tightness and pressure with a burning, indigestion-like sensation. Per H&P    Problem List[1]     Past Medical/Surgical History:  Medical History[2]   Past Surgical History[3]      X-Rays/Tests/Labs:    Echo Adult TTE Complete  Result Date: 11/10/2024  Date: 11/10/2024  Left ventricular systolic function is normal with an ejection fraction by Biplane Method of Discs of  68 %. Left ventricular segmental wall motion is normal. Left ventricular diastolic function could not be accurately assessed due to tachycardia. Mildly decreased right ventricular systolic function. There is mild tricuspid regurgitation. 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 Moderate pulmonary hypertension with estimated right ventricular systolic pressure of  46 mmHg. Compared to the prior study, estimated pulmonary pressures are elevated with decreased RV systolic function and dilated IVC. Findings reported to primary team, Lenward Buys, PA at 12:08 pm on 11/11/23.       US  Venous Low Extrem Duplx Dopp Comp Bilat  Result Date: 11/10/2024  1.Occlusive thrombosis of the right distal femoral vein, popliteal vein, posterior tibial veins and one of the peroneal veins. 2.No sonographic evidence for left lower extremity deep venous thrombosis. Artie Russella Estrin, MD 11/10/2024 10:34 AM     CT Angio Chest (PE study)  Result Date: 11/10/2024   1.Bilateral acute pulmonary emboli with evidence of high clot  burden and right cardiac strain. Recommend interventional radiology consultation to guide further management. 2. No evidence of pulmonary infarction or pleural effusion. 3. The lungs are clear except for minimal left basilar atelectasis. 4. Critical findings were discussed with, and read back by, Dr.  Laymon Rocher at the following time 11/10/2024 6:22 AM. Austin Door, MD 11/10/2024 6:31 AM     Chest AP Portable  Result Date: 11/10/2024   No evidence of pneumonia. The lungs are well-expanded and grossly clear. 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, [employer], [place], [place] (use the cane out in the community and Occupational Hygienist in the house)  Home Living Arrangements  Living Arrangements: Alone  Type of Home: Apartment Air Cabin Crew)  Home Layout: One level  Bathroom Shower/Tub: Pension Scheme Manager:  (tub/shower restroom has a RTS the walk in shower BR has a standard toilet)  Bathroom Accessibility: Accessible  DME Currently at Home: Environmental Consultant, [employer], [place], [place] (use the cane out in the community and Rollator in the house)      Subjective:    Patient is agreeable to participation in the therapy session. Nursing clears patient for therapy.   Patient Goal  Patient Goal: continue working on her strength  Pain Assessment  Pain Assessment: Numeric Scale (0-10)  Pain Score: 2-mild pain  Pain Location: Abdomen  Pain Orientation: Left  Pain Intervention(s): Medication (See eMAR)       Objective:   Observation of Patient/Vital Signs:  Patient is in bed with telemetry and peripheral IV in place.         Cognition/Neuro Status  Arousal/Alertness: Appropriate responses to stimuli  Attention Span: Appears intact  Orientation Level: Oriented X4  Memory: Appears intact  Following Commands: Follows all commands and directions without  difficulty  Safety Awareness: independent  Insights: Fully aware of deficits  Behavior: attentive;calm;cooperative      Hearing: WNL  Vision: wears glasses  Sensation: WNL    Gross ROM  Right Lower Extremity ROM: within functional limits  Left Lower Extremity ROM: within functional limits  Gross Strength  Right Lower Extremity Strength: 4/5  Left Lower Extremity Strength: 4/5  Tone  Tone: within functional limits  *MMT scale per [employer] System    Functional Mobility  Rolling: Independent  Supine to Sit: Independent  Scooting to EOB: Independent  Sit to Supine: Independent  Sit to Stand: Supervision  Stand to Sit: Supervision     Locomotion  Ambulation: Supervision;with front-wheeled walker  Pattern: R foot decreased clearance;L foot decreased clearance (forward flexed posture)  Distance Walked (ft) (Step 6,7): 200 Feet  PMP Activity: Step 7 - Walks out of Room (6)     Balance  Sitting - Static: Good  Sitting - Dynamic: Good  Standing - Static: Good  Standing - Dynamic: Good (with RW)    Participation and Endurance  Participation Effort: excellent  Endurance: Endurance does not limit participation in activity  AM-PAC 6 Clicks Basic Mobility Inpatient Short Form  Turning Over in Bed: None  Sitting Down On/Standing From Armchair: None  Lying on Back to Sitting on Side of Bed: A little  Assist Moving to/from Bed to Chair: None  Assist to Walk in Hospital Room: None  Assist to Climb 3-5 Steps with Railing: A little  PT Basic Mobility Raw Score: 22  CMS 0-100% Score: 20.91%    Treatment Activities: Pt performed mobility and ambulation safely and requires only supervision due to decreased endurance from hospitalization. The pt had no c/o SOB of lightheadedness or dizziness with mobility or ambulation. Pt's O2 sats after ambulating >200' was 99% on RA.    Educated the patient to role of physical therapy, plan of care, goals of therapy and safety with mobility and ADLs.    Pt left supine in bed  without needs with call light in reach. Pt's daughter present. RN notified of session outcome.      Therapist PPE during session gloves            [1]   Patient Active Problem List  Diagnosis    Primary hypertension    Mixed hyperlipidemia    Gastroesophageal reflux disease    Hiatal hernia    Vitamin D  deficiency    Severe obesity (CMS/HCC)    S/P laparoscopic sleeve gastrectomy    Obesity (BMI 30-39.9)    LUQ abdominal pain    Chronic constipation    Other low back pain    Left leg pain    Radiculopathy, lumbar region    ASCVD (arteriosclerotic cardiovascular disease)    Acute saddle pulmonary embolism, unspecified whether acute cor pulmonale present (CMS/HCC)   [2]   Past Medical History:  Diagnosis Date    Abnormal vision 10/23/1998    wears glasses    Arthritis     Constipation     HX - HAS BEEN MORE PREVELANT POST BARIATRIC SURGERY.    COVID-19 10/2021    HX - 21//23 URGENT CARE NOTE IN EPIC.    Difficulty walking 05/24/2024    Due to nerve pinched in spine & 2-3-4 lumbar in back spine area    Fibroids     PT HAD A PARTIAL HYSTERECTOMY - OVARIES ARE STILL IN PLACE.    Gastroesophageal reflux disease 10/23/2013    HX.    Hypercholesterolemia 03/23/2010    High Bad Cholesterol Level    Hyperlipidemia 10/23/2016    MANAGED WITH MEDS.    Hypertension 07/17/2024    PT STATES HE BP RUNS FROM 128-130/70s. MANAGED WITH MEDS - FOLLOWED BY PCP.    Left wrist fracture 2018    HX    Low back pain 05/24/2024    Pinched nerve & arthritis    LUQ pain     HX    Pain in joint 05/24/2024    Left knee, thigh and leg    Psoriatic arthritis (CMS/HCC)     HX - LOWER BACK, LEFT HAND, WRIST AND FINGERS    Urinary incontinence 03/23/2017    HX OVERACTIVE BLADDER. ISSUE RESOLVED POST BARIATRIC SURGERY - PT STATED SHE LOST 65 LBS.   [3]   Past Surgical History:  Procedure Laterality Date    BUNIONECTOMY Left     COLONOSCOPY, DIAGNOSTIC (SCREENING)      X 2     COLONOSCOPY, DIAGNOSTIC (SCREENING) N/A 04/13/2021    Procedure: COLONOSCOPY;   Surgeon: Ulysess Foots, MD;  Location: [id number];  Service: Gastroenterology;  Laterality:  N/A;    EGD N/A 05/18/2020    Procedure: EGD w/ bx's;  Surgeon: Marcey Sammi BIRCH, MD;  Location: [facility];  Service: Gastroenterology;  Laterality: N/A;    EGD  12/2020    EGD N/A 01/24/2022    Procedure: EGD w/ Bx's;  Surgeon: Marcey Sammi BIRCH, MD;  Location: [facility];  Service: Gastroenterology;  Laterality: N/A;    HYSTERECTOMY  03/23/1997    PARTIAL - OVARIES IN PLACE.    LAPAROSCOPIC GASTRECTOMY SLEEVE WITH HIATAL HERNIA N/A 07/28/2020    Procedure: LAPAROSCOPIC GASTRECTOMY SLEEVE WITH HIATAL HERNIA, OMENTOPEXY, LAPAROSCOPIC RESECTION OF ANTRAL AND FUNDIC GASTRIC MASS;  Surgeon: Eliberto Oris SAUNDERS, DO;  Location: [facility];  Service: General;  Laterality: N/A;

## 2024-11-13 NOTE — Consults (Addendum)
 Start [facility] Note  Home Health Referral      By [employer] systems, the patient has the right to freely choose a home care provider.    A company of the patients choosing. We have supplied the patient with a listing of providers in your area who asked to be included and participate in Medicare.   [facility] a home care agency that provides adult home care services and participates in Medicare   The preferred provider of your insurance company. Choosing a home care provider other than your insurance company's preferred provider may affect your insurance coverage.      Home Health Discharge Information    Your doctor has ordered Skilled Nursing, Physical Therapy, and Occupational Therapy in-home service(s) for you while you recuperate at home, to assist you in the transition from hospital to home.    The agency that you or your representative chose to provide the service:   ][facility]   [phone]      The above services were set up by:  Nicholas Roberts, RN (Post Acute Care Coordinator)   Phone: [phone]      IF YOU HAVE NOT HEARD FROM YOUR HOME HEALTH AGENCY WITHIN 24-48 HOURS AFTER DISCHARGE PLEASE CALL YOUR AGENCY TO ARRANGE A TIME FOR YOUR FIRST VISIT. FOR ANY SCHEDULING CONCERNS OR QUESTIONS RELATED TO HOME HEALTH, SUCH AS TIME OR DATE PLEASE CONTACT YOUR HOME HEALTH AGENCY AT THE NUMBER LISTED ABOVE.    Additional comments:        START PATIENT REGISTRATION INFORMATION     Order Information  Order Signing Physician: Alfornia Millet, MD    Service Ordered RN ?: Yes  Service Ordered PT ?: Yes  Service Ordered OT ?: Yes  Service Ordered ST ?: No    Service Ordered MSW?: No    Service Ordered HHA?: No    Following Physician: Lovett Josette HERO, NP   Following Physician Phone: [phone]   Overseeing Physician: N/A  (Required for Residents only)   Agreeable to Follow?: N/A  Spoke with: N/A  Date/Time of Call: 11/13/24 4:24 PM      Care Coordination   SOC Call from [facility]  Required?: no  [facility]?: no  Primary Care Physician:Kristen HERO Lovett, NP  Primary Care Physician Phone:[phone]  Primary Care Physician Address: [phone] / [place] [address]  PCP NPI: [id number]  Visit Instructions: N/A  Service Discharge Location Type: Home  Service Facility Name: N/A  Service Floor Facility: N/A  Service Room No: N/A    Demographics  Patient Last Name: Rubio   Patient First Name: Barbara  Language/Communication Barrier: no  Service Address: [address]  [place] [address]   Service Home Phone: [phone] (home)   Other phone numbers:    Telephone Information:   Mobile [phone]     Emergency Contact: Extended Emergency Contact Information  Primary Emergency Contact: Henriette, Hesser  Address: [address]           [place] , [place] [address] [place]  Home Phone: [phone]  Work Phone: [phone]  Mobile Phone: [phone]  Relation: Daughter    Admission Information  Admit Date: 11/10/2024  Patient Status at discharge: Inpatient  Admitting Diagnosis: Tachypnea [R06.82]  Hypoxia [R09.02]  Chest pain, unspecified type [R07.9]  Acute saddle pulmonary embolism, unspecified whether acute cor pulmonale present (CMS/HCC) [I26.92]     Caregiver Information  Caregiver First Name: N/A  Caregiver Last  Name: N/A  Caregiver Relationship to Patient: Other  Caregiver Phone Number: N/A  Caregiver Notes: N/A            Psychologist, Counselling Subscriber:   Primary Subscriber Relation To Guarantor:   Primary Payor:   Primary Plan:   Primary Group #:    Primary Subscriber ID:    Primary Subscriber DOB:   Secondary Insurance Information  Secondary Subscriber:   Secondary Subscriber Relation To Guarantor:   Secondary Payor:   Secondary Plan:   Secondary Group #:   Secondary Subscriber ID:   Secondary Subscriber DOB:   HITECH  NO      END PATIENT REGISTRATION INFORMATION       Diagnosis: Tachypnea  [R06.82]  Hypoxia [R09.02]  Chest pain, unspecified type [R07.9]  Acute saddle pulmonary embolism, unspecified whether acute cor pulmonale present (CMS/HCC) [I26.92]    Start [facility] Summary        Additional Comments:     End [facility] Summary     Discharge Date:  1/22    Referral Source  Signed by: Nicholas Roberts, RN  Date Time: 11/13/24 4:24 PM      End [facility] Note

## 2024-11-13 NOTE — Discharge Summary -  Nursing (Signed)
IV removed with catheter intact. Tele box removed and returned.  Pt d/c instructions read and understood by pt, allowed adequate time for questions. Pt taken down in wheelchair with personal belongings to family vehicle.  Destination is home.

## 2024-11-13 NOTE — Progress Notes (Signed)
 PACC spoke to pt family, agreeable to [facility] services and no preference of agency.    Nicholas Roberts, RN  Post-Acute Care Coordinator  [phone]

## 2024-11-24 ENCOUNTER — Encounter (INDEPENDENT_AMBULATORY_CARE_PROVIDER_SITE_OTHER): Payer: Self-pay | Admitting: Family

## 2024-11-24 ENCOUNTER — Ambulatory Visit (INDEPENDENT_AMBULATORY_CARE_PROVIDER_SITE_OTHER): Admitting: Family Medicine

## 2024-11-24 ENCOUNTER — Encounter (INDEPENDENT_AMBULATORY_CARE_PROVIDER_SITE_OTHER): Payer: Self-pay | Admitting: Family Medicine

## 2024-11-24 VITALS — BP 107/78 | HR 98 | Temp 97.8°F | Resp 18

## 2024-11-24 DIAGNOSIS — Z09 Encounter for follow-up examination after completed treatment for conditions other than malignant neoplasm: Secondary | ICD-10-CM

## 2024-11-24 DIAGNOSIS — I2699 Other pulmonary embolism without acute cor pulmonale: Secondary | ICD-10-CM

## 2024-11-24 DIAGNOSIS — N3281 Overactive bladder: Secondary | ICD-10-CM

## 2024-11-24 MED ORDER — MIRABEGRON ER 25 MG PO TB24: 25.0000 mg | ORAL_TABLET | Freq: Every day | ORAL | 3 refills | Status: AC

## 2024-11-24 NOTE — Progress Notes (Signed)
 Have you seen any specialists since your last visit with [place]?  Yes      The patient was informed that the following HM items are still outstanding:   influenza vaccine

## 2024-11-25 ENCOUNTER — Ambulatory Visit (INDEPENDENT_AMBULATORY_CARE_PROVIDER_SITE_OTHER): Admitting: Student in an Organized Health Care Education/Training Program

## 2024-11-26 ENCOUNTER — Encounter (INDEPENDENT_AMBULATORY_CARE_PROVIDER_SITE_OTHER): Payer: Self-pay | Admitting: Family

## 2024-11-27 ENCOUNTER — Inpatient Hospital Stay: Admitting: Rehabilitative and Restorative Service Providers"

## 2024-11-27 ENCOUNTER — Other Ambulatory Visit: Payer: Self-pay | Admitting: Physician Assistant

## 2024-11-27 ENCOUNTER — Telehealth: Payer: Self-pay

## 2024-11-27 ENCOUNTER — Telehealth (INDEPENDENT_AMBULATORY_CARE_PROVIDER_SITE_OTHER): Payer: Self-pay | Admitting: Family

## 2024-11-27 DIAGNOSIS — I272 Pulmonary hypertension, unspecified: Secondary | ICD-10-CM

## 2024-11-27 NOTE — Telephone Encounter (Addendum)
 Returned the patients call to review scheduling, the patient was not available at the time of my call. I left a detailed message asking the patient to return my call, my name and call back number was provided.     Patient returned my call to review scheduling, the patient has been referred by her PCP Dr. Hilarie Reagin for a PE. The patient has been scheduled with Dr. Jama at the [facility] location on 3/5 at 3 PM. The patient is aware of the date and time and is aware of the location.

## 2024-11-27 NOTE — Telephone Encounter (Signed)
 Copied from CRM #[id number]. Topic: Non-Appointment Question - More Information  >> Nov 27, 2024  8:32 AM Ada L wrote:  Barbara Rubio called about Non-Appointment Question - More Information.  Additional details:  Per RN Orie from [facility] she is calling to notify pcp that pt was not seen by her today due to being out of town with her daughter.

## 2024-12-02 ENCOUNTER — Ambulatory Visit (INDEPENDENT_AMBULATORY_CARE_PROVIDER_SITE_OTHER): Admitting: Family

## 2024-12-23 ENCOUNTER — Ambulatory Visit (HOSPITAL_BASED_OUTPATIENT_CLINIC_OR_DEPARTMENT_OTHER): Admitting: Orthopaedic Surgery

## 2024-12-25 ENCOUNTER — Ambulatory Visit: Admitting: Hematology & Oncology

## 2024-12-29 ENCOUNTER — Encounter (INDEPENDENT_AMBULATORY_CARE_PROVIDER_SITE_OTHER)

## 2024-12-30 ENCOUNTER — Ambulatory Visit (INDEPENDENT_AMBULATORY_CARE_PROVIDER_SITE_OTHER): Admitting: Cardiology

## 2025-01-07 ENCOUNTER — Ambulatory Visit (INDEPENDENT_AMBULATORY_CARE_PROVIDER_SITE_OTHER): Admitting: Family

## 2025-01-29 ENCOUNTER — Ambulatory Visit (INDEPENDENT_AMBULATORY_CARE_PROVIDER_SITE_OTHER): Admitting: Cardiology
